# Patient Record
Sex: Female | Born: 1975 | Race: Black or African American | Hispanic: No | Marital: Single
Health system: Southern US, Academic
[De-identification: ages and names within clinical notes are randomized; demographics above are authoritative.]

## PROBLEM LIST (undated history)

## (undated) ENCOUNTER — Encounter

## (undated) ENCOUNTER — Ambulatory Visit

## (undated) ENCOUNTER — Ambulatory Visit: Payer: PRIVATE HEALTH INSURANCE

## (undated) ENCOUNTER — Encounter
Attending: Student in an Organized Health Care Education/Training Program | Primary: Student in an Organized Health Care Education/Training Program

## (undated) ENCOUNTER — Ambulatory Visit: Payer: Medicare (Managed Care)

## (undated) ENCOUNTER — Ambulatory Visit: Payer: PRIVATE HEALTH INSURANCE | Attending: Adult Health | Primary: Adult Health

## (undated) ENCOUNTER — Encounter: Attending: Adult Health | Primary: Adult Health

## (undated) ENCOUNTER — Ambulatory Visit: Payer: MEDICARE

## (undated) ENCOUNTER — Telehealth

## (undated) ENCOUNTER — Encounter: Attending: Pharmacist | Primary: Pharmacist

## (undated) ENCOUNTER — Encounter: Attending: Family | Primary: Family

## (undated) ENCOUNTER — Telehealth
Attending: Student in an Organized Health Care Education/Training Program | Primary: Student in an Organized Health Care Education/Training Program

## (undated) ENCOUNTER — Telehealth: Attending: Radiation Oncology | Primary: Radiation Oncology

## (undated) ENCOUNTER — Inpatient Hospital Stay: Payer: Medicare (Managed Care)

## (undated) ENCOUNTER — Ambulatory Visit: Attending: Radiation Oncology | Primary: Radiation Oncology

## (undated) ENCOUNTER — Ambulatory Visit: Payer: PRIVATE HEALTH INSURANCE | Attending: Physician Assistant | Primary: Physician Assistant

## (undated) ENCOUNTER — Telehealth: Attending: MS" | Primary: MS"

## (undated) ENCOUNTER — Inpatient Hospital Stay: Payer: MEDICAID

## (undated) ENCOUNTER — Encounter: Attending: Oncology | Primary: Oncology

## (undated) ENCOUNTER — Ambulatory Visit
Payer: PRIVATE HEALTH INSURANCE | Attending: Rehabilitative and Restorative Service Providers" | Primary: Rehabilitative and Restorative Service Providers"

## (undated) ENCOUNTER — Ambulatory Visit: Payer: Medicaid (Managed Care)

## (undated) ENCOUNTER — Ambulatory Visit: Payer: PRIVATE HEALTH INSURANCE | Attending: MS" | Primary: MS"

## (undated) ENCOUNTER — Other Ambulatory Visit

## (undated) ENCOUNTER — Encounter: Attending: Critical Care Medicine | Primary: Critical Care Medicine

## (undated) ENCOUNTER — Encounter: Attending: Radiation Oncology | Primary: Radiation Oncology

## (undated) ENCOUNTER — Ambulatory Visit: Payer: MEDICAID

## (undated) ENCOUNTER — Telehealth: Attending: Pharmacist | Primary: Pharmacist

## (undated) ENCOUNTER — Ambulatory Visit: Payer: Medicare (Managed Care) | Attending: Neurology | Primary: Neurology

## (undated) ENCOUNTER — Encounter: Attending: MS" | Primary: MS"

## (undated) ENCOUNTER — Telehealth: Attending: Adult Health | Primary: Adult Health

## (undated) ENCOUNTER — Telehealth: Attending: Family Medicine | Primary: Family Medicine

## (undated) ENCOUNTER — Telehealth: Attending: Critical Care Medicine | Primary: Critical Care Medicine

## (undated) ENCOUNTER — Ambulatory Visit: Payer: Medicare (Managed Care) | Attending: Adult Health | Primary: Adult Health

## (undated) ENCOUNTER — Ambulatory Visit: Payer: PRIVATE HEALTH INSURANCE | Attending: Radiation Oncology | Primary: Radiation Oncology

## (undated) ENCOUNTER — Telehealth: Attending: Children | Primary: Children

## (undated) ENCOUNTER — Encounter: Attending: Hematology & Oncology | Primary: Hematology & Oncology

## (undated) ENCOUNTER — Telehealth: Attending: "Endocrinology | Primary: "Endocrinology

## (undated) ENCOUNTER — Telehealth: Attending: Family | Primary: Family

## (undated) ENCOUNTER — Inpatient Hospital Stay

## (undated) ENCOUNTER — Telehealth: Attending: Pulmonary Disease | Primary: Pulmonary Disease

## (undated) DIAGNOSIS — D219 Benign neoplasm of connective and other soft tissue, unspecified: Secondary | ICD-10-CM

## (undated) DIAGNOSIS — C50919 Malignant neoplasm of unspecified site of unspecified female breast: Secondary | ICD-10-CM

## (undated) DIAGNOSIS — I10 Essential (primary) hypertension: Secondary | ICD-10-CM

## (undated) HISTORY — DX: Essential (primary) hypertension: I10

## (undated) HISTORY — DX: Malignant neoplasm of unspecified site of unspecified female breast: C50.919

## (undated) HISTORY — DX: Benign neoplasm of connective and other soft tissue, unspecified: D21.9

## (undated) HISTORY — PX: ABDOMINAL HYSTERECTOMY: SHX81

---

## 2005-03-20 ENCOUNTER — Emergency Department: Payer: Self-pay | Admitting: Emergency Medicine

## 2005-03-21 ENCOUNTER — Ambulatory Visit: Payer: Self-pay | Admitting: Emergency Medicine

## 2005-08-14 ENCOUNTER — Observation Stay: Payer: Self-pay | Admitting: Obstetrics and Gynecology

## 2005-09-01 ENCOUNTER — Observation Stay: Payer: Self-pay | Admitting: Obstetrics and Gynecology

## 2005-09-08 ENCOUNTER — Observation Stay: Payer: Self-pay | Admitting: Obstetrics and Gynecology

## 2005-09-12 ENCOUNTER — Inpatient Hospital Stay: Payer: Self-pay | Admitting: Obstetrics and Gynecology

## 2007-04-14 ENCOUNTER — Emergency Department: Payer: Self-pay | Admitting: Emergency Medicine

## 2007-04-22 ENCOUNTER — Ambulatory Visit: Payer: Self-pay | Admitting: Obstetrics and Gynecology

## 2007-04-23 ENCOUNTER — Inpatient Hospital Stay: Payer: Self-pay | Admitting: Obstetrics and Gynecology

## 2007-04-27 ENCOUNTER — Emergency Department: Payer: Self-pay | Admitting: General Practice

## 2008-04-30 ENCOUNTER — Other Ambulatory Visit: Payer: Self-pay

## 2008-04-30 ENCOUNTER — Emergency Department: Payer: Self-pay | Admitting: Emergency Medicine

## 2008-08-04 ENCOUNTER — Emergency Department: Payer: Self-pay | Admitting: Emergency Medicine

## 2009-07-22 IMAGING — CT CT ABD-PELV W/O CM
1 of 2 series · 15 of 32 positions shown, 19 images · non-contrast
Comparison: none

REASON FOR EXAM: (1) R FLANK, RUQ, R LATERAL, RLQ PAIN; (2) PARTIAL HYST,
UNSURE WHICH OVARY LEFT
COMMENTS:

[Series 2: stone · axial · 0.63mm/px · z∈[-848,-482]mm · 15 of 137 slices shown, 19 images]
[im 10/137  soft-tissue]
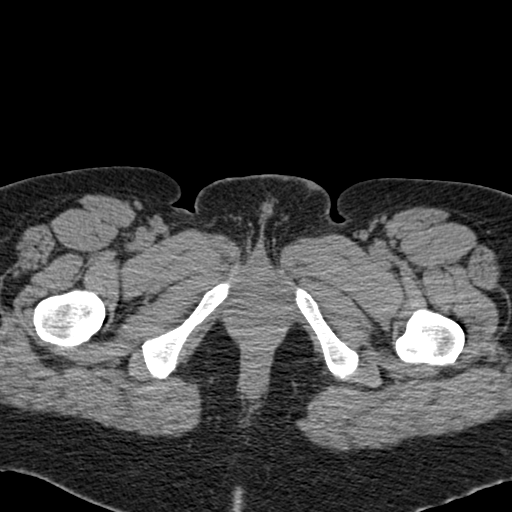
[im 10/137  bone]
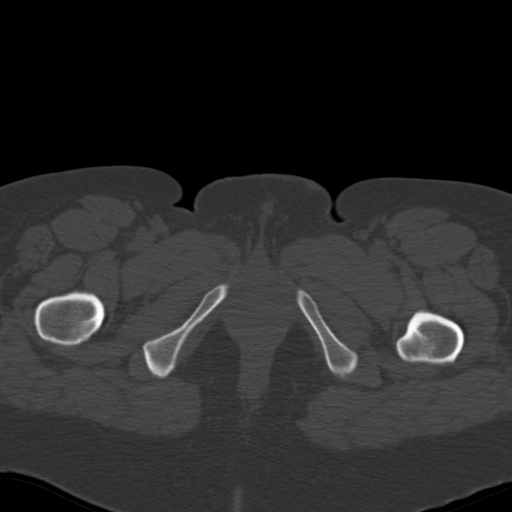
[im 20/137  soft-tissue]
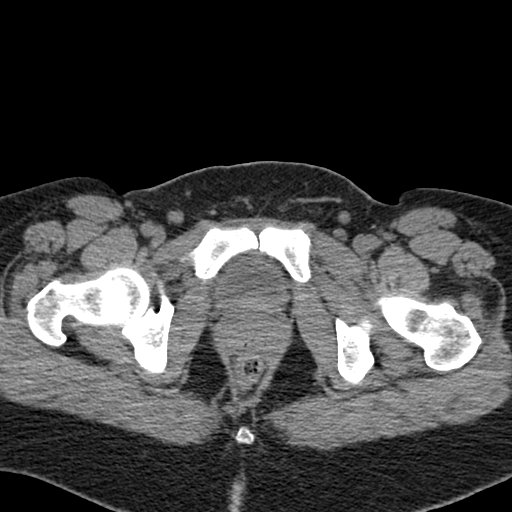
[im 30/137  soft-tissue]
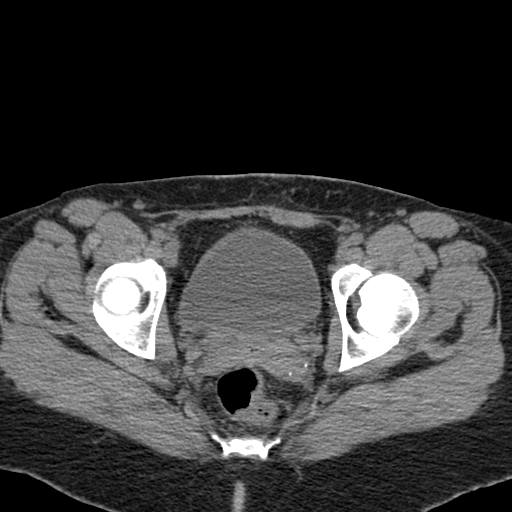
[im 39/137  soft-tissue]
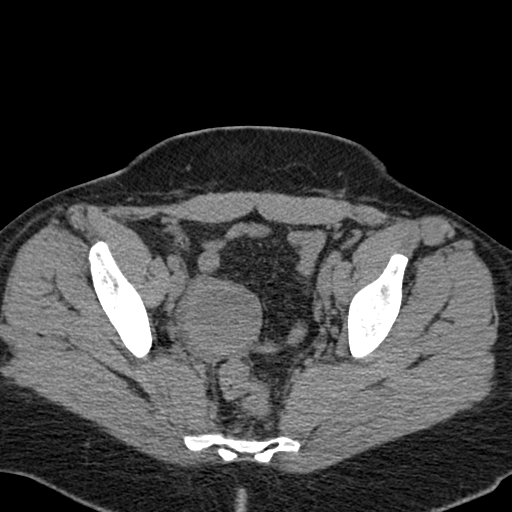
[im 49/137  soft-tissue]
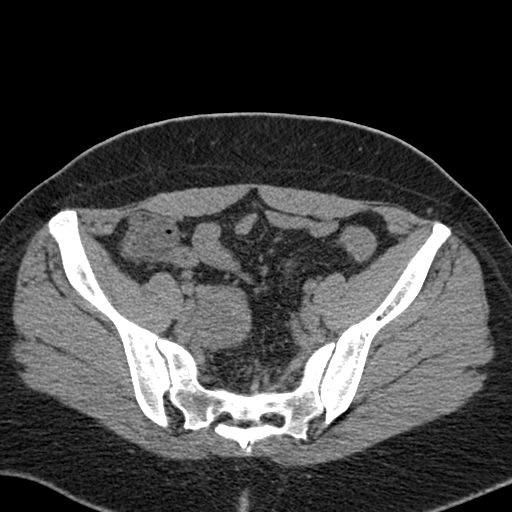
[im 59/137  soft-tissue]
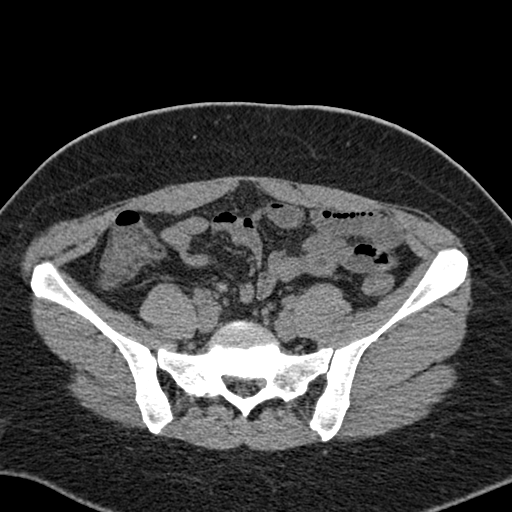
[im 69/137  soft-tissue]
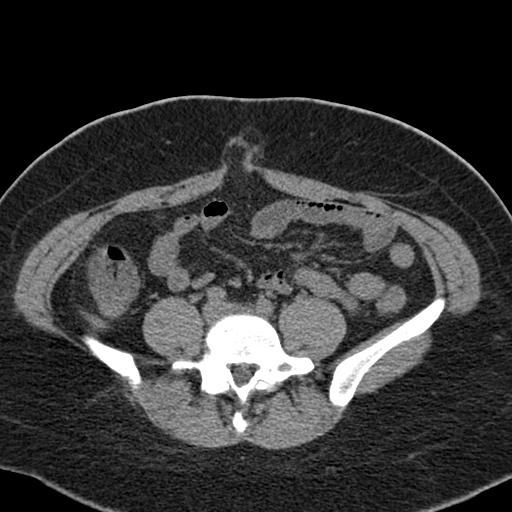
[im 78/137  soft-tissue]
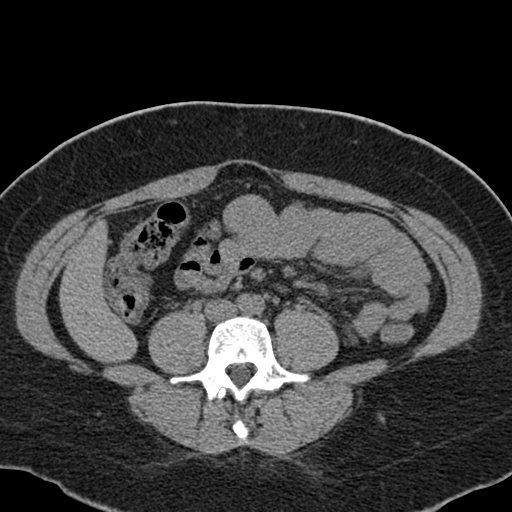
[im 88/137  soft-tissue]
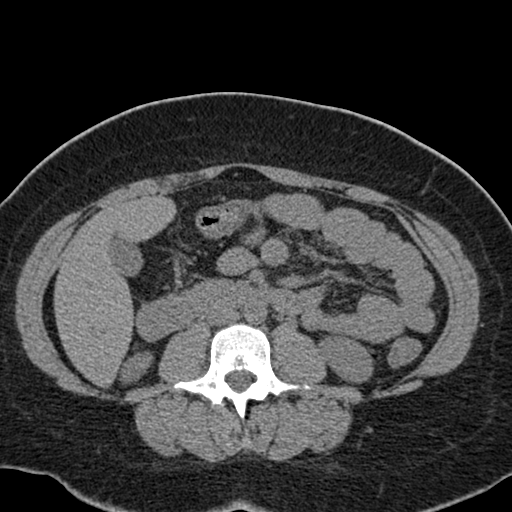
[im 88/137  bone]
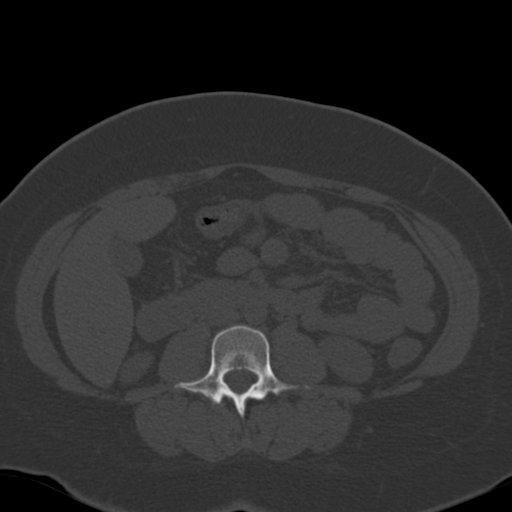
[im 98/137  soft-tissue]
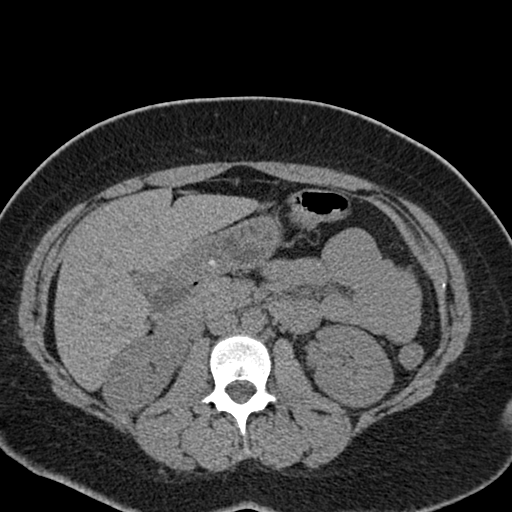
[im 107/137  soft-tissue]
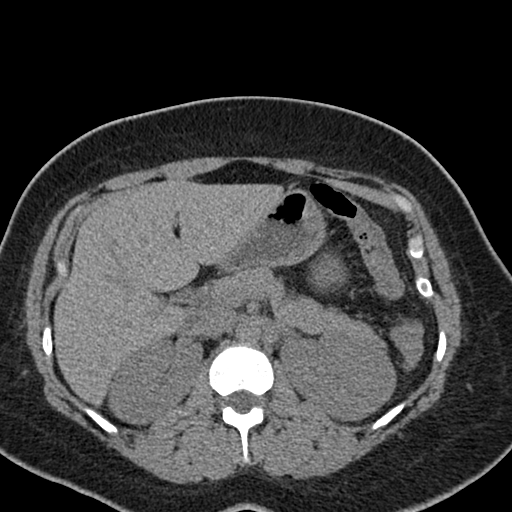
[im 117/137  soft-tissue]
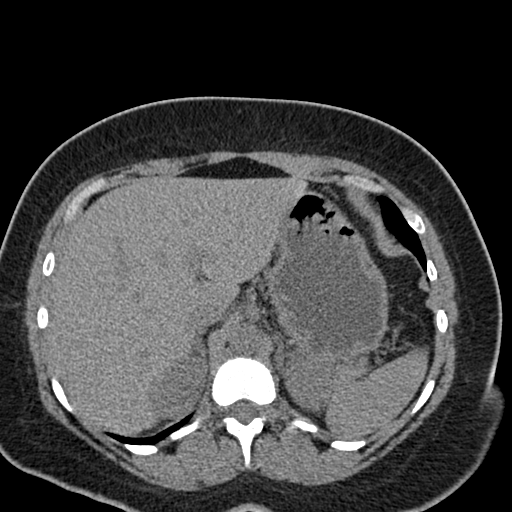
[im 117/137  lung]
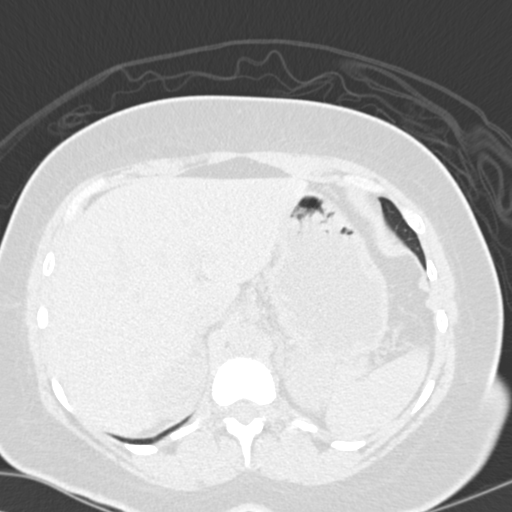
[im 122/137  lung]
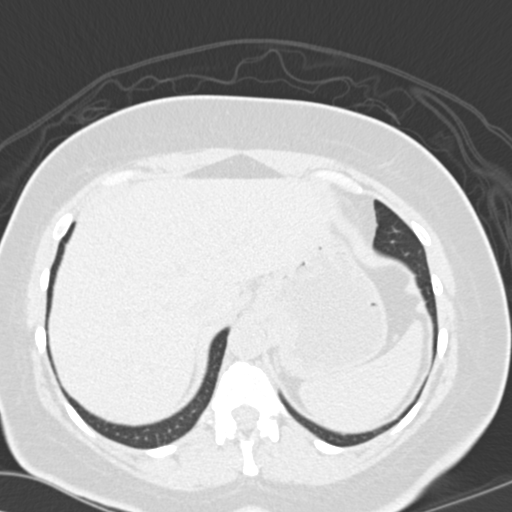
[im 127/137  soft-tissue]
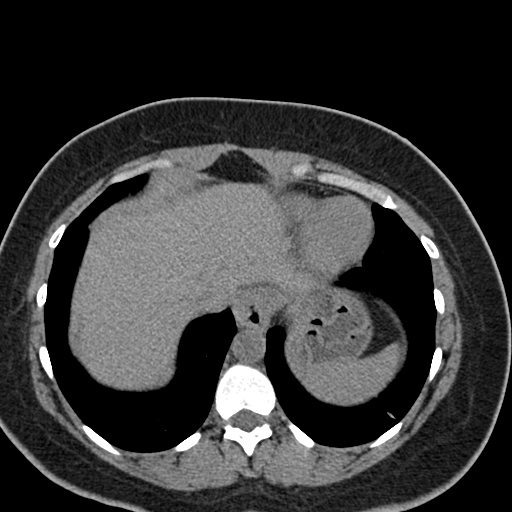
[im 127/137  lung]
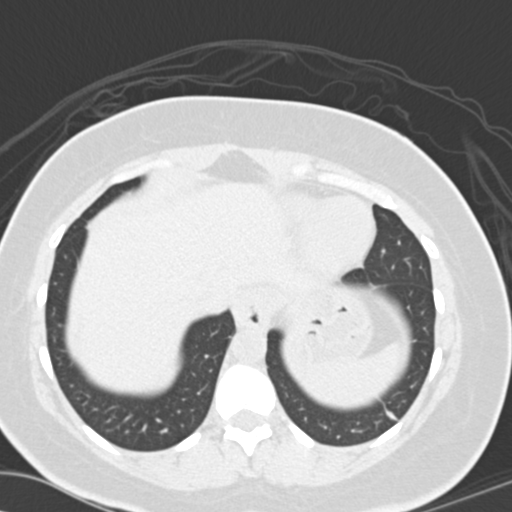
[im 132/137  lung]
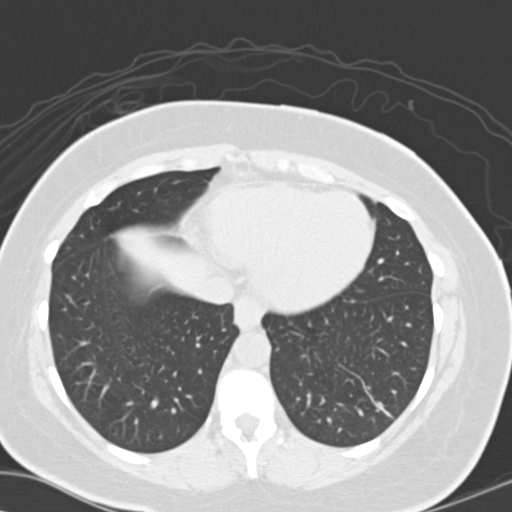

[15 of 32 positions shown; findings below may reference images not displayed]

PROCEDURE:     CT  - CT ABDOMEN AND PELVIS W[DATE]  [DATE]

RESULT:     Helical noncontrasted 3-mm sections were obtained from the lung
bases through the pubic symphysis.

Evaluation of the lung bases demonstrates no gross abnormalities.

Within the limitations of a noncontrasted CT, the liver, spleen, adrenals,
pancreas and kidneys are unremarkable.  There is no CT evidence of an
abdominal aortic aneurysm or CT evidence of bowel obstruction,
diverticulitis, colitis or appendicitis.

Specifically, there is no CT evidence of nephrolithiasis, ureterolithiasis,
hydronephrosis or hydroureter.

Within the pelvis, a 4.9 cm rounded mass is appreciated.  This demonstrates
Hounsfield units of 30. Differential considerations are an ovarian cyst
possibly a complex cyst and/or hemorrhagic.  Etiology such as an
endometrioma cannot be excluded. No further pelvic masses, free fluid or
drainable loculated fluid collections are appreciated.  The urinary bladder
is distended with urine.  Phleboliths are identified within the pelvis.
IMPRESSION: 1.     Low attenuating mass within the RIGHT adnexal region.  Differential
considerations are a complex and/or hemorrhagic cyst or possibly etiology
such as an endometrioma.  A dermoid cannot be completely excluded and
further evaluation with pelvic ultrasound is recommended.  Non ovarian
etiologies cannot be excluded either.
2.     Dr. Danii of the Emergency Department was informed of these findings
via preliminary fax report on 04/30/08 at [DATE] a.m. CST.

## 2009-07-22 IMAGING — US US PELV - US TRANSVAGINAL
1 series · 17 of 25 positions shown · non-contrast
Comparison: none

REASON FOR EXAM: EVAL RIGHT COMPLEX CYST
COMMENTS:

[Series 1: us pelv - us transvaginal · 17 of 39 slices shown]
[im 1/39]
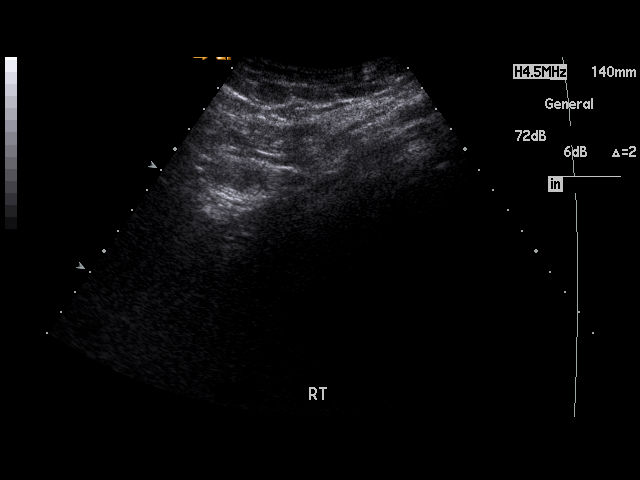
[im 4/39]
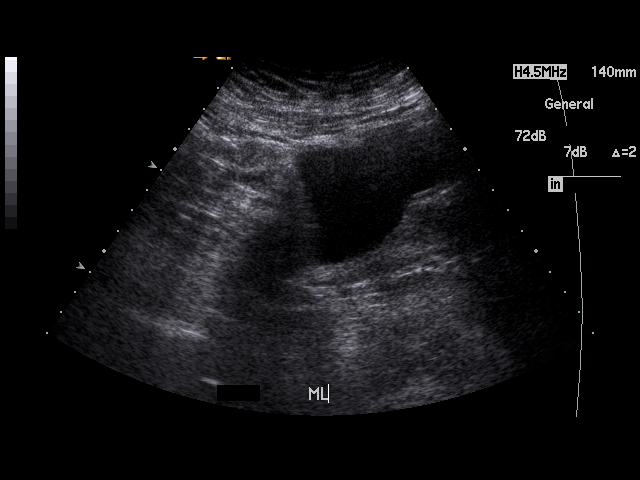
[im 5/39]
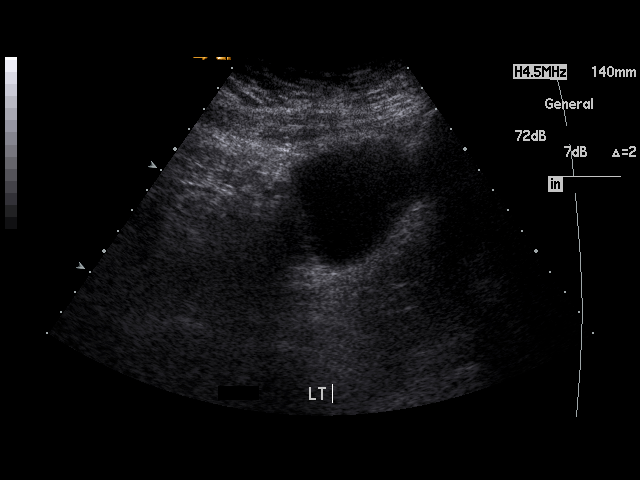
[im 8/39]
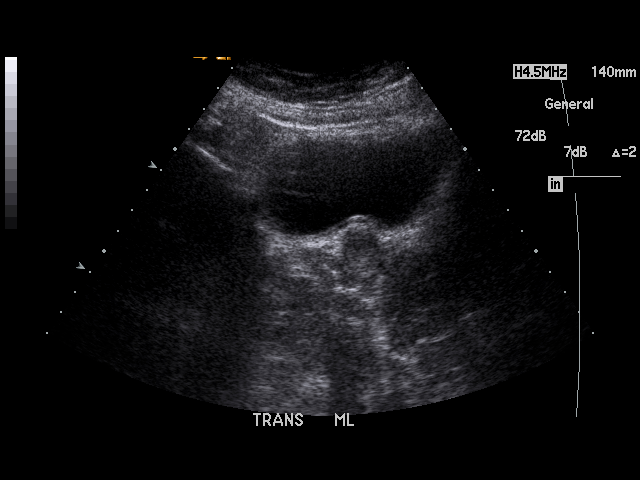
[im 10/39]
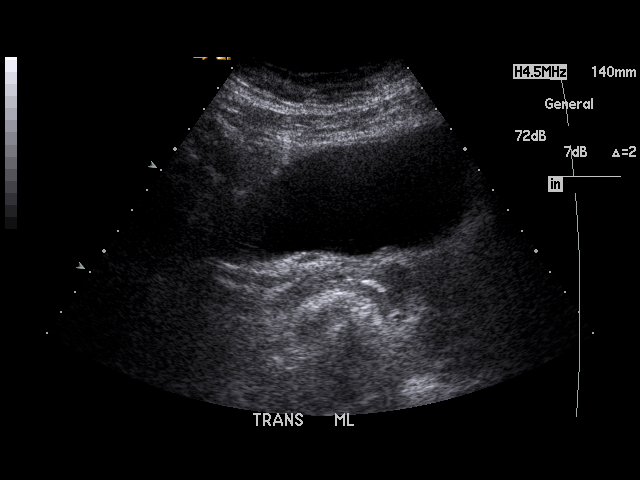
[im 13/39]
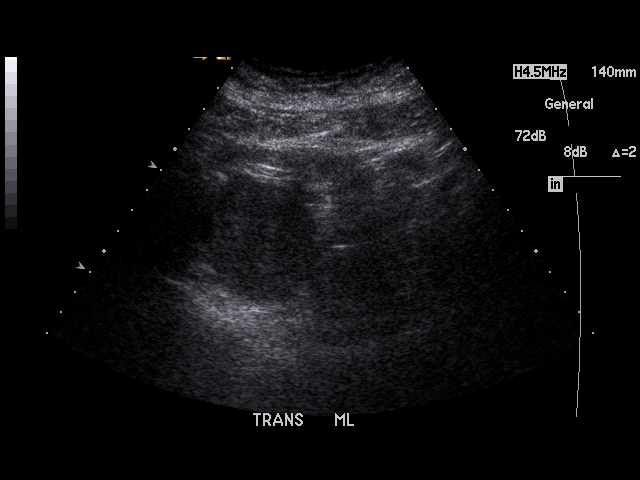
[im 15/39]
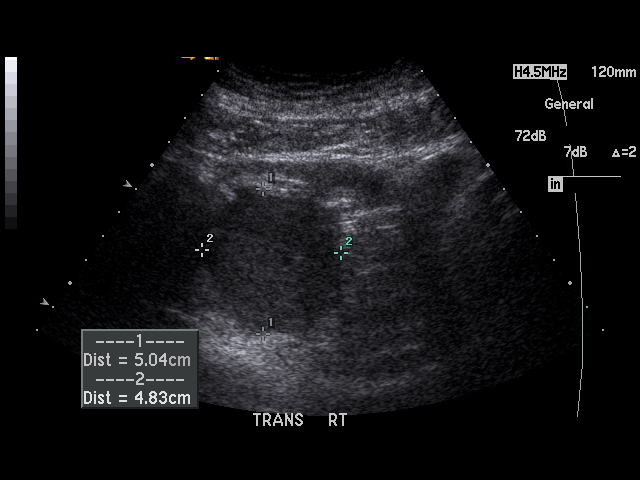
[im 18/39]
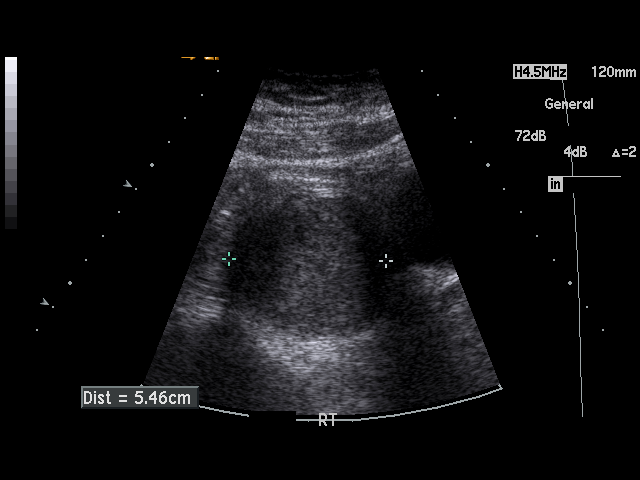
[im 20/39]
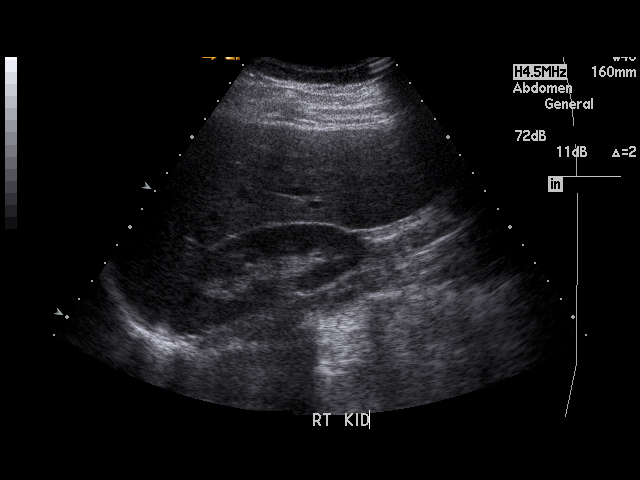
[im 21/39]
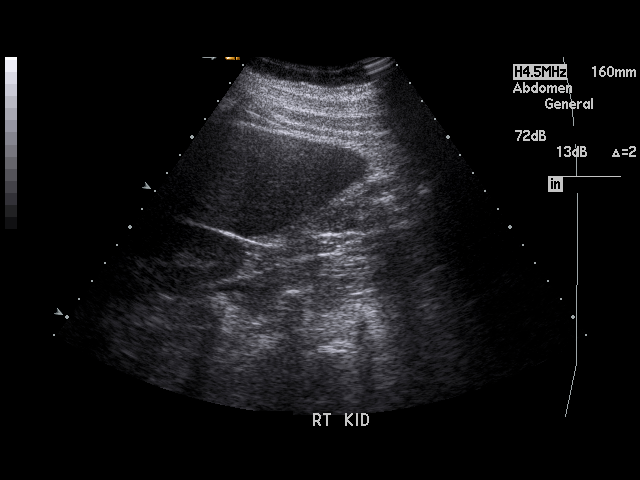
[im 24/39]
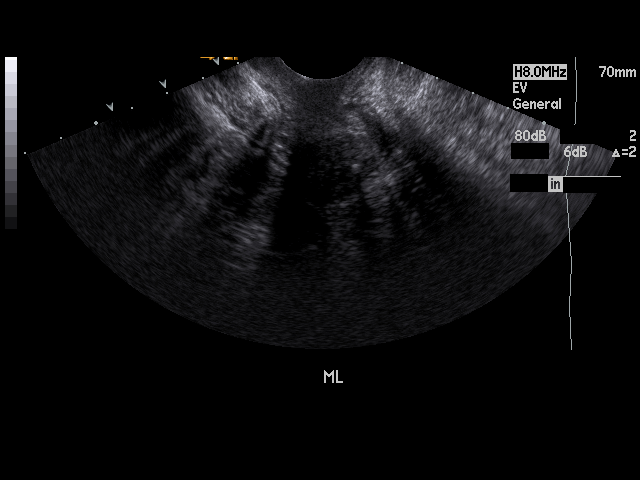
[im 26/39]
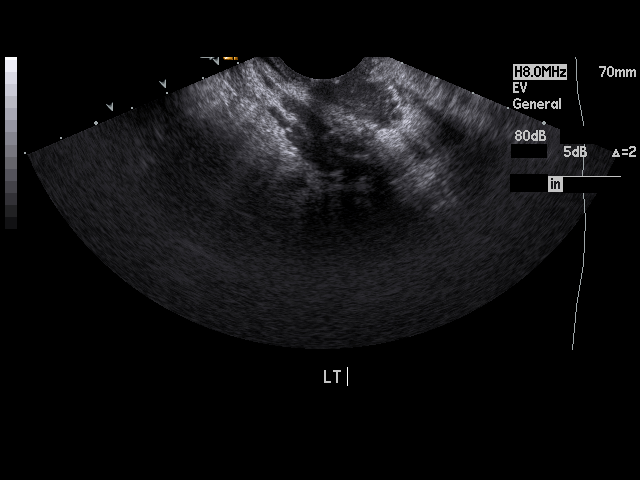
[im 29/39]
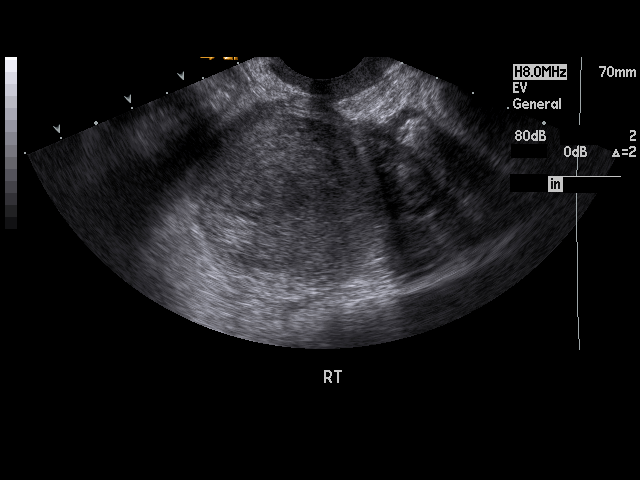
[im 31/39]
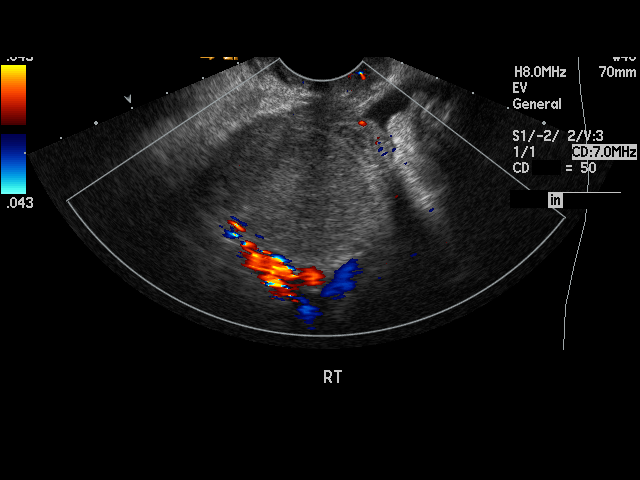
[im 34/39]
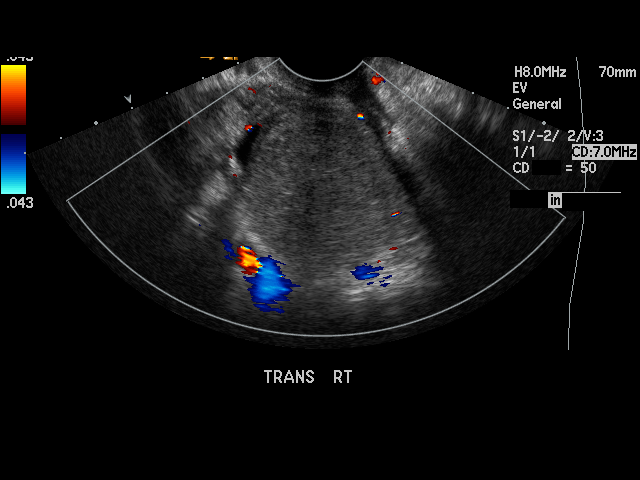
[im 35/39]
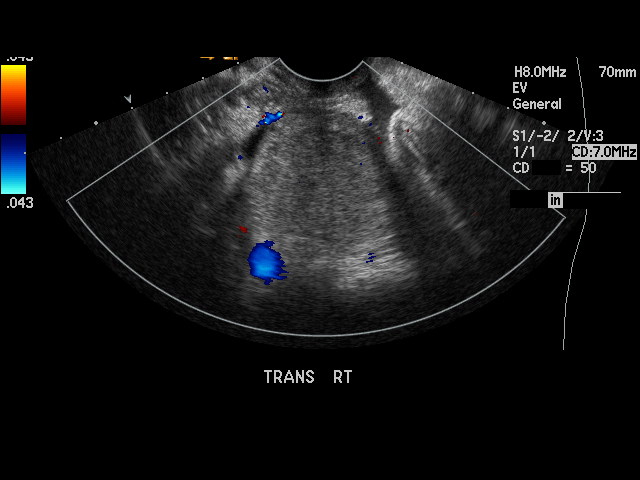
[im 39/39]
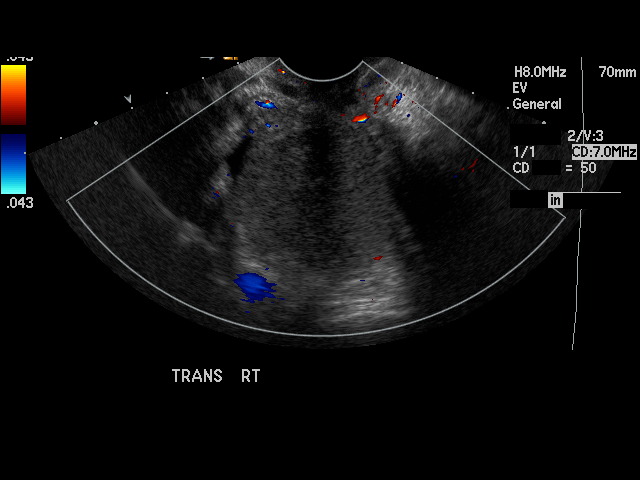

[17 of 25 positions shown; findings below may reference images not displayed]

PROCEDURE:     US  - US PELVIS MASS EXAM W/TRANSVAGI  - April 30, 2008  [DATE]

RESULT:     The patient is status post hysterectomy.  Endovaginal imaging
was performed.

Within the RIGHT adnexal region, the RIGHT ovary is appreciated measuring
5.05 x 5.02 x 5.02 cm.

A complex mass is appreciated involving the RIGHT ovary measuring 4.3 x
x 4.57 cm.  Evaluation of the RIGHT and LEFT kidneys demonstrates no gross
sonographic abnormalities. There does not appear to be evidence of color
flow within the RIGHT adnexal ovarian mass.
IMPRESSION: 1.     Complex mass involving the RIGHT ovary. Differential considerations
are a complex and/or hemorrhagic cyst.  Etiology such as an endometrioma
cannot be excluded nor more ominous etiologies if and as clinically
warranted.   Further evaluation with GYN consultation recommended.  This
area can be monitored with surveillance ultrasound if and as clinically
warranted or further evaluated with direct visualization.
2.     Dr. Ceejay of the Emergency Department was informed of these findings
via preliminary fax report on 04/30/08 at [DATE] a.m. CST.

## 2011-08-26 ENCOUNTER — Emergency Department: Payer: Self-pay | Admitting: Emergency Medicine

## 2017-11-04 ENCOUNTER — Emergency Department
Admission: EM | Admit: 2017-11-04 | Discharge: 2017-11-04 | Disposition: A | Discharge status: Home / Self Care | Payer: Medicaid Other | Attending: Emergency Medicine | Admitting: Emergency Medicine

## 2017-11-04 ENCOUNTER — Emergency Department: Payer: Medicaid Other

## 2017-11-04 ENCOUNTER — Encounter: Payer: Self-pay | Admitting: Emergency Medicine

## 2017-11-04 DIAGNOSIS — F17210 Nicotine dependence, cigarettes, uncomplicated: Secondary | ICD-10-CM | POA: Insufficient documentation

## 2017-11-04 DIAGNOSIS — R0789 Other chest pain: Secondary | ICD-10-CM | POA: Diagnosis not present

## 2017-11-04 DIAGNOSIS — J410 Simple chronic bronchitis: Secondary | ICD-10-CM | POA: Insufficient documentation

## 2017-11-04 DIAGNOSIS — R9431 Abnormal electrocardiogram [ECG] [EKG]: Secondary | ICD-10-CM | POA: Insufficient documentation

## 2017-11-04 DIAGNOSIS — R0981 Nasal congestion: Secondary | ICD-10-CM | POA: Diagnosis present

## 2017-11-04 LAB — BASIC METABOLIC PANEL
ANION GAP: 9 (ref 5–15)
BUN: 10 mg/dL (ref 6–20)
CALCIUM: 8.7 mg/dL — AB (ref 8.9–10.3)
CO2: 24 mmol/L (ref 22–32)
CREATININE: 0.69 mg/dL (ref 0.44–1.00)
Chloride: 103 mmol/L (ref 101–111)
GFR calc non Af Amer: >60 mL/min (ref >60)
Glucose, Bld: 93 mg/dL (ref 65–99)
Potassium: 3.9 mmol/L (ref 3.5–5.1)
SODIUM: 136 mmol/L (ref 135–145)

## 2017-11-04 LAB — CBC
HCT: 41.2 % (ref 35.0–47.0)
HEMOGLOBIN: 13.4 g/dL (ref 12.0–16.0)
MCH: 24.7 pg — AB (ref 26.0–34.0)
MCHC: 32.4 g/dL (ref 32.0–36.0)
MCV: 76.2 fL — ABNORMAL LOW (ref 80.0–100.0)
PLATELETS: 192 10*3/uL (ref 150–440)
RBC: 5.4 MIL/uL — AB (ref 3.80–5.20)
RDW: 13.8 % (ref 11.5–14.5)
WBC: 6 10*3/uL (ref 3.6–11.0)

## 2017-11-04 LAB — TROPONIN I

## 2017-11-04 MED ORDER — BENZONATATE 100 MG PO CAPS: 100.0000 mg | ORAL_CAPSULE | Freq: Three times a day (TID) | ORAL | 0 refills | Status: DC | PRN

## 2017-11-04 MED ORDER — ASPIRIN 81 MG PO CHEW: 324.0000 mg | CHEWABLE_TABLET | Freq: Once | ORAL | Status: DC

## 2017-11-04 MED ORDER — ALBUTEROL SULFATE HFA 108 (90 BASE) MCG/ACT IN AERS: INHALATION_SPRAY | RESPIRATORY_TRACT | 1 refills | Status: DC

## 2017-11-04 NOTE — ED Triage Notes (Signed)
Pt sent to ED by PCP for abnormal EKG. Pt has c/o of chest pain that she relates to congestion that has been ongoing for 3 weeks. Pt has productive cough that produces clear mucus.

## 2017-11-04 NOTE — ED Notes (Signed)
Pt states she went to the [facility] today for cough with chest pain that she has had intermittent, states she has had a cough for the past 3 weeks.Marland Kitchen

## 2017-11-04 NOTE — Discharge Instructions (Addendum)
You have been seen in the Emergency Department (ED) today for an abnormal EKG and occasional chest pains.  As we have discussed today?s test results are reassuring, but you may require further testing.  Please follow up with the recommended doctor as instructed above in these documents regarding today?s emergent visit and your recent symptoms to discuss further management.  Continue to take your regular medications. If you are not doing so already, please also take a daily baby aspirin (81 mg), at least until you follow up with your doctor or a cardiologist.  Return to the Emergency Department (ED) if you experience any further chest pain/pressure/tightness, difficulty breathing, or sudden sweating, or other symptoms that concern you.

## 2017-11-04 NOTE — ED Provider Notes (Signed)
[facility] Emergency Department Provider Note  ____________________________________________   First MD Initiated Contact with Patient 11/04/17 1427     (approximate)  I have reviewed the triage vital signs and the nursing notes.   HISTORY  Chief Complaint No chief complaint on file.    HPI Tara Hanna is a 41 y.o. female who smokes but has no other chronic medical history and presents at the recommendation of the urgent care provider she saw earlier today due to abnormal EKG findings.  The patient has had respiratory symptoms for about 3 weeks, which started as a cold with the usual nasal congestion, runny nose, and cough, but has developed into just a persistent cough.  She reports that she has occasionally felt some chest pain or discomfort that is mild and not related to any specific activity except possibly coughing.  She went to the urgent care today to see if she can get some medicine to help with her cough but when they did an EKG they found some abnormal findings and referred her to the emergency department.  She reports that she has not had any chest pain recently and it is not predictable.  She denies shortness of breath, fever/chills, nausea, vomiting, and abdominal pain.  Her cough is more annoying than it is a problem more painful.  She describes her symptoms as mild, possibly moderate with a cough.  She does not have hypertension, diabetes, nor a personal history of heart disease.  She does smoke every day.  There is no first-degree relative family history of heart attack.   History reviewed. No pertinent past medical history.  There are no active problems to display for this patient.   Past Surgical History:  Procedure Laterality Date  . ABDOMINAL HYSTERECTOMY      Prior to Admission medications   Medication Sig Start Date End Date Taking? Authorizing Provider  albuterol (PROVENTIL HFA;VENTOLIN HFA) 108 (90 Base) MCG/ACT inhaler Inhale  2-4 puffs by mouth every 4 hours as needed for wheezing, cough, and/or shortness of breath 11/04/17   Hinda Kehr, MD  benzonatate (TESSALON PERLES) 100 MG capsule Take 1 capsule (100 mg total) by mouth 3 (three) times daily as needed for cough. 11/04/17   Hinda Kehr, MD    Allergies Patient has no known allergies.  History reviewed. No pertinent family history.  Social History Social History   Tobacco Use  . Smoking status: Current Every Day Smoker    Packs/day: 1.00    Types: Cigarettes  . Smokeless tobacco: Never Used  Substance Use Topics  . Alcohol use: Yes  . Drug use: Yes    Types: Marijuana    Review of Systems Constitutional: No fever/chills Eyes: No visual changes. ENT: No sore throat. Cardiovascular: occasional mild chest pain over the last several weeks Respiratory: Denies shortness of breath.  Persistent cough x 3 weeks Gastrointestinal: No abdominal pain.  No nausea, no vomiting.  No diarrhea.  No constipation. Genitourinary: Negative for dysuria. Musculoskeletal: Negative for neck pain.  Negative for back pain. Integumentary: Negative for rash. Neurological: Negative for headaches, focal weakness or numbness.   ____________________________________________   PHYSICAL EXAM:  VITAL SIGNS: ED Triage Vitals  Enc Vitals Group     BP 11/04/17 1213 (!) 150/102     Pulse Rate 11/04/17 1213 79     Resp 11/04/17 1213 20     Temp 11/04/17 1213 98 F (36.7 C)     Temp src --  SpO2 11/04/17 1213 100 %     Weight 11/04/17 1218 90.7 kg (200 lb)     Height 11/04/17 1218 1.626 m (5\' 4" )     Head Circumference --      Peak Flow --      Pain Score 11/04/17 1224 6     Pain Loc --      Pain Edu? --      Excl. in [place]? --     Constitutional: Alert and oriented. Well appearing and in no acute distress. Eyes: Conjunctivae are normal.  Head: Atraumatic. Nose: No congestion/rhinnorhea. Mouth/Throat: Mucous membranes are moist. Neck: No stridor.  No  meningeal signs.   Cardiovascular: Normal rate, regular rhythm. Good peripheral circulation. Grossly normal heart sounds. Respiratory: Normal respiratory effort.  No retractions. Lungs CTAB.  Occasional cough. Gastrointestinal: Soft and nontender. No distention.  Musculoskeletal: No lower extremity tenderness nor edema. No gross deformities of extremities. Neurologic:  Normal speech and language. No gross focal neurologic deficits are appreciated.  Skin:  Skin is warm, dry and intact. No rash noted. Psychiatric: Mood and affect are normal. Speech and behavior are normal.  ____________________________________________   LABS (all labs ordered are listed, but only abnormal results are displayed)  Labs Reviewed  BASIC METABOLIC PANEL - Abnormal; Notable for the following components:      Result Value   Calcium 8.7 (*)    All other components within normal limits  CBC - Abnormal; Notable for the following components:   RBC 5.40 (*)    MCV 76.2 (*)    MCH 24.7 (*)    All other components within normal limits  TROPONIN I   ____________________________________________  EKG  ED ECG REPORT I, Hinda Kehr, the attending physician, personally viewed and interpreted this ECG.  Date: 11/04/2017 EKG Time: 12:17 PM Rate: 74 Rhythm: normal sinus rhythm QRS Axis: normal Intervals: normal ST/T Wave abnormalities: normal Narrative Interpretation: no evidence of acute ischemia  ____________________________________________  RADIOLOGY   Dg Chest 2 View  Result Date: 11/04/2017 CLINICAL DATA:  Chest pain for several weeks EXAM: CHEST  2 VIEW COMPARISON:  None. FINDINGS: The heart size and mediastinal contours are within normal limits. Both lungs are clear. The visualized skeletal structures are unremarkable. IMPRESSION: No active cardiopulmonary disease. Electronically Signed   By: Inez Catalina M.D.   On: 11/04/2017 13:15     ____________________________________________   PROCEDURES  Critical Care performed: No   Procedure(s) performed:   Procedures   ____________________________________________   INITIAL IMPRESSION / ASSESSMENT AND PLAN / ED COURSE  As part of my medical decision making, I reviewed the following data within the [facility] notes reviewed and incorporated, Labs reviewed , EKG interpreted , Old EKG reviewed and Old chart reviewed    Differential diagnosis includes, but is not limited to, ACS, aortic dissection, pulmonary embolism, cardiac tamponade, pneumothorax, pneumonia, pericarditis, myocarditis, GI-related causes including esophagitis/gastritis, and musculoskeletal chest wall pain.    I reviewed the documentation sent over from the urgent care.  Unfortunately I could not read the majority of the handwriting on the notes, but I did see the EKG and there were inverted T waves in leads II, III, and aVF.  However, the patient's EKG was completely normal here in the emergency department, and she denies having chest pain at the time of the EKG at the urgent care.  I do not think this represents dynamic EKG changes in the setting of ACS.  Patient is PERC negative  and has a HEART score of 2 (low risk), even taking into consideration the EKG changes seen at urgent care.  Based upon her low risk and chronicity of symptoms, I do not think that she would benefit from a second troponin.  I believe it is more likely that the patient is having occasional chest pain related to her chronic cough and/or GI causes, because she states that when she is having the pain she feels gassy and the symptoms were relieved when she took some antacids.  Regardless I do not feel she needs admission but I did counsel her about the benefits of an outpatient follow-up appointment with cardiology, possibly for a stress test.  I gave her a full dose aspirin in the emergency department and  encouraged her to take a daily baby aspirin.  I prescribed Tessalon and an albuterol inhaler for cough.  I gave my usual and customary return precautions.  She understands and agrees with the plan    ____________________________________________  FINAL CLINICAL IMPRESSION(S) / ED DIAGNOSES  Final diagnoses:  Simple chronic bronchitis (HCC)  Abnormal EKG  Atypical chest pain     MEDICATIONS GIVEN DURING THIS VISIT:  Medications  aspirin chewable tablet 324 mg      ED Discharge Orders        Ordered    albuterol (PROVENTIL HFA;VENTOLIN HFA) 108 (90 Base) MCG/ACT inhaler     11/04/17 1447    benzonatate (TESSALON PERLES) 100 MG capsule  3 times daily PRN     11/04/17 1447       Note:  This document was prepared using Dragon voice recognition software and may include unintentional dictation errors.    Hinda Kehr, MD 11/04/17 [phone]

## 2017-12-28 NOTE — Progress Notes (Signed)
New Outpatient Visit Date: 12/30/2017  Referring Provider: [facility] Emergency Department  Chief Complaint: Abnormal EKG and chest pain  HPI:  Ms. Sepulveda is a 42 y.o. female who is being seen today for the evaluation of abnormal EKG at the request of No ref. provider found. She has a history of tobacco use but otherwise no significant past medical history. She presented to [facility] care in mid December with a 3-week history of URI symptoms followed by a persistent cough. She noted occasional chest pain related to coughing. EKG at urgent care was reportedly abnormal, prompting referral to the ED, where repeat EKG was found to be normal.  Today, Ms. Tyndall reports that she continues to have intermittent chest pain.  It seems to be brought on by talking for extended periods or being under stress.  She has had quite a few stressors at home, including recent incarceration of one of her sons.  She describes the pain as both a "lightning pain" on the inside under the left breast and at other times a "big ball of gas" or knot in the center of her chest.  The gas-like pain resolves with belching and has also been helped by an OTC antacid.  Typically, the discomfort lasts anywhere from 5-20-second.  It is not exertional.  Ms. Hoggard sometimes feels a little diaphoretic with the pain, though she notes that she sweats easily even in other situations.  Currently the discomfort happens once or twice a week with a maximal intensity of 5/10.  Ms. Chimenti denies history of cardiac disease and prior testing.  She reports no shortness of breath, orthopnea, and PND.  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  She has also noted rare skipped beats over the last several years.  There are no accompanying symptoms.  -------------------------------------------------------------------------------------------------  Cardiovascular History & Procedures: Cardiovascular  Problems:  Atypical chest pain  Risk Factors:  Tobacco use and obesity  Cath/PCI:  None  CV Surgery:  None  EP Procedures and Devices:  None  Non-Invasive Evaluation(s):  None  Recent CV Pertinent Labs: Lab Results  Component Value Date   K 3.9 11/04/2017   BUN 10 11/04/2017   CREATININE 0.69 11/04/2017   --------------------------------------------------------------------------------------------------  Past Medical History:  Diagnosis Date  . Fibroids     Past Surgical History:  Procedure Laterality Date  . ABDOMINAL HYSTERECTOMY      Current Meds  Medication Sig  . albuterol (PROVENTIL HFA;VENTOLIN HFA) 108 (90 Base) MCG/ACT inhaler Inhale 2-4 puffs by mouth every 4 hours as needed for wheezing, cough, and/or shortness of breath    Allergies: Patient has no known allergies.  Social History   Socioeconomic History  . Marital status: Single    Spouse name: Not on file  . Number of children: Not on file  . Years of education: Not on file  . Highest education level: Not on file  Social Needs  . Financial resource strain: Not on file  . Food insecurity - worry: Not on file  . Food insecurity - inability: Not on file  . Transportation needs - medical: Not on file  . Transportation needs - non-medical: Not on file  Occupational History  . Not on file  Tobacco Use  . Smoking status: Current Every Day Smoker    Packs/day: 1.00    Years: 22.00    Pack years: 22.00    Types: Cigarettes  . Smokeless tobacco: Never Used  Substance and  Sexual Activity  . Alcohol use: Yes    Alcohol/week: 1.2 oz    Types: 2 Shots of liquor per week  . Drug use: Yes    Frequency: 2.0 times per week    Types: Marijuana  . Sexual activity: No  Other Topics Concern  . Not on file  Social History Narrative  . Not on file    Family History  Problem Relation Age of Onset  . Diabetes Mother   . Hypertension Mother   . Heart failure Maternal Grandmother   .  Diabetes Maternal Grandmother     Review of Systems: A 12-system review of systems was performed and was negative except as noted in the HPI.  --------------------------------------------------------------------------------------------------  Physical Exam: BP 132/80 (BP Location: Right Arm, Patient Position: Sitting, Cuff Size: Normal)   Pulse 81   Ht 5\' 5"  (1.651 m)   Wt 200 lb 8 oz (90.9 kg)   BMI 33.36 kg/m   General: Obese woman, seated comfortably in the exam room. HEENT: No conjunctival pallor or scleral icterus. Moist mucous membranes. OP clear. Neck: Supple without lymphadenopathy, thyromegaly, JVD, or HJR. No carotid bruit. Lungs: Normal work of breathing. Clear to auscultation bilaterally without wheezes or crackles. Heart: Regular rate and rhythm without murmurs, rubs, or gallops. Non-displaced PMI. Abd: Bowel sounds present. Soft, NT/ND without hepatosplenomegaly Ext: No lower extremity edema. Radial, PT, and DP pulses are 2+ bilaterally Skin: Warm and dry without rash. Neuro: CNIII-XII intact. Strength and fine-touch sensation intact in upper and lower extremities bilaterally. Psych: Normal mood and affect.  EKG: Normal sinus rhythm without abnormalities.  Lab Results  Component Value Date   WBC 6.0 11/04/2017   HGB 13.4 11/04/2017   HCT 41.2 11/04/2017   MCV 76.2 (L) 11/04/2017   PLT 192 11/04/2017    Lab Results  Component Value Date   NA 136 11/04/2017   K 3.9 11/04/2017   CL 103 11/04/2017   CO2 24 11/04/2017   BUN 10 11/04/2017   CREATININE 0.69 11/04/2017   GLUCOSE 93 11/04/2017    No results found for: CHOL, HDL, LDLCALC, LDLDIRECT, TRIG, CHOLHDL  --------------------------------------------------------------------------------------------------  ASSESSMENT AND PLAN: Atypical chest pain Chest pain is brief and nonexertional.  Description is most consistent with GERD and/or anxiety.  However, Ms. Bartosiewicz is concerned about the potential for  underlying coronary disease, though her only risk factors are tobacco use and obesity.  We have agreed to obtain an exercise tolerance test.  If this is normal, I think it is reasonable to defer additional cardiac workup unless symptoms worsen.  I have recommended famotidine 20 mg twice daily for empiric treatment of GERD.  I will check a fasting lipid panel when the patient returns for her exercise tolerance test for risk stratification.  Abnormal EKG There is report of abnormal EKG when Ms. Dimiceli presented to [facility] in December, 2018.  EKGs from subsequent ED visit as well as today are both normal.  I will request a copy of the reportedly abnormal EKG for further review.  Polysubstance abuse I have recommended that Ms. Dado stop smoking and refrain from using marijuana.  Follow-up: To be determined based on results of stress test.  If normal, Ms. Toomey can follow-up as needed.  Nelva Bush, MD 12/30/2017 9:39 PM

## 2017-12-30 ENCOUNTER — Ambulatory Visit (INDEPENDENT_AMBULATORY_CARE_PROVIDER_SITE_OTHER): Payer: Medicaid Other | Admitting: Internal Medicine

## 2017-12-30 ENCOUNTER — Encounter: Payer: Self-pay | Admitting: Internal Medicine

## 2017-12-30 VITALS — BP 132/80 | HR 81 | Ht 65.0 in | Wt 200.5 lb

## 2017-12-30 DIAGNOSIS — R9431 Abnormal electrocardiogram [ECG] [EKG]: Secondary | ICD-10-CM

## 2017-12-30 DIAGNOSIS — R0789 Other chest pain: Secondary | ICD-10-CM | POA: Diagnosis not present

## 2017-12-30 DIAGNOSIS — F1910 Other psychoactive substance abuse, uncomplicated: Secondary | ICD-10-CM

## 2017-12-30 MED ORDER — FAMOTIDINE 20 MG PO TABS: 20.0000 mg | ORAL_TABLET | Freq: Two times a day (BID) | ORAL | 3 refills | Status: DC

## 2017-12-30 NOTE — Patient Instructions (Signed)
Medication Instructions:  Your physician has recommended you make the following change in your medication:  1- START Famotidine 20 mg (1 tablet) by mouth two times a day.    Labwork: none  Testing/Procedures: Your physician has requested that you have an exercise tolerance test. For further information please visit [email or web address]. Please also follow instruction sheet, as given.   DO NOT drink or eat foods with caffeine for 24 hours before the test. (Chocolate, coffee, tea, decaf coffee/tea, or energy drinks)  DO NOT smoke for 4 hours before your test.  If you use an inhaler, bring it with you to the test.  Wear comfortable shoes and clothing. Women do not wear dresses.   Follow-Up: Your physician recommends that you schedule a follow-up appointment in: as needed depending on test results.     Exercise Stress Electrocardiogram An exercise stress electrocardiogram is a test to check how blood flows to your heart. It is done to find areas of poor blood flow. You will need to walk on a treadmill for this test. The electrocardiogram will record your heartbeat when you are at rest and when you are exercising. What happens before the procedure?  Do not have drinks with caffeine or foods with caffeine for 24 hours before the test, or as told by your doctor. This includes coffee, tea (even decaf tea), sodas, chocolate, and cocoa.  Follow your doctor's instructions about eating and drinking before the test.  Ask your doctor what medicines you should or should not take before the test. Take your medicines with water unless told by your doctor not to.  If you use an inhaler, bring it with you to the test.  Bring a snack to eat after the test.  Do not  smoke for 4 hours before the test.  Do not put lotions, powders, creams, or oils on your chest before the test.  Wear comfortable shoes and clothing. What happens during the procedure?  You will have patches put on your chest.  Small areas of your chest may need to be shaved. Wires will be connected to the patches.  Your heart rate will be watched while you are resting and while you are exercising.  You will walk on the treadmill. The treadmill will slowly get faster to raise your heart rate.  The test will take about 1-2 hours. What happens after the procedure?  Your heart rate and blood pressure will be watched after the test.  You may return to your normal diet, activities, and medicines or as told by your doctor. This information is not intended to replace advice given to you by your health care provider. Make sure you discuss any questions you have with your health care provider. Document Released: 04/28/2008 Document Revised: 07/09/2016 Document Reviewed: 07/18/2013 Elsevier Interactive Patient Education  [employer].

## 2017-12-31 ENCOUNTER — Telehealth: Payer: Self-pay | Admitting: *Deleted

## 2017-12-31 DIAGNOSIS — Z13220 Encounter for screening for lipoid disorders: Secondary | ICD-10-CM

## 2017-12-31 NOTE — Telephone Encounter (Signed)
-----   Message from Nelva Bush, MD sent at 12/30/2017  9:32 PM EST ----- Claris Gladden,  Would it be possible to draw a fasting lipid panel when Ms. Oo comes in for her GXT? Thanks.  Gerald Stabs

## 2017-12-31 NOTE — Telephone Encounter (Signed)
Patient verbalized understanding to be fasting the morning of GXT for lab work. She will come around 0800, have lab work, and bring a light snack to have prior to stress test. Order entered. Scheduler notified.

## 2018-01-04 ENCOUNTER — Other Ambulatory Visit (INDEPENDENT_AMBULATORY_CARE_PROVIDER_SITE_OTHER): Payer: Medicaid Other

## 2018-01-04 ENCOUNTER — Ambulatory Visit (INDEPENDENT_AMBULATORY_CARE_PROVIDER_SITE_OTHER): Payer: Medicaid Other

## 2018-01-04 DIAGNOSIS — Z13220 Encounter for screening for lipoid disorders: Secondary | ICD-10-CM | POA: Diagnosis not present

## 2018-01-04 DIAGNOSIS — R0789 Other chest pain: Secondary | ICD-10-CM

## 2018-01-05 LAB — EXERCISE TOLERANCE TEST
CHL RATE OF PERCEIVED EXERTION: 16
CSEPHR: 87 %
Estimated workload: 7.6 METS
Exercise duration (min): 6 min
Exercise duration (sec): 26 s
MPHR: 178 {beats}/min
Peak HR: 155 {beats}/min
Rest HR: 86 {beats}/min

## 2018-01-05 LAB — LIPID PANEL
CHOL/HDL RATIO: 2.8 ratio (ref 0.0–4.4)
Cholesterol, Total: 168 mg/dL (ref 100–199)
HDL: 60 mg/dL (ref >39)
LDL Calculated: 96 mg/dL (ref 0–99)
TRIGLYCERIDES: 58 mg/dL (ref 0–149)
VLDL Cholesterol Cal: 12 mg/dL (ref 5–40)

## 2019-01-26 IMAGING — CR DG CHEST 2V
2 series · 2 of 2 positions shown · non-contrast
Comparison: None.

CLINICAL DATA: Chest pain for several weeks

EXAM:
CHEST  2 VIEW

[chest pa]
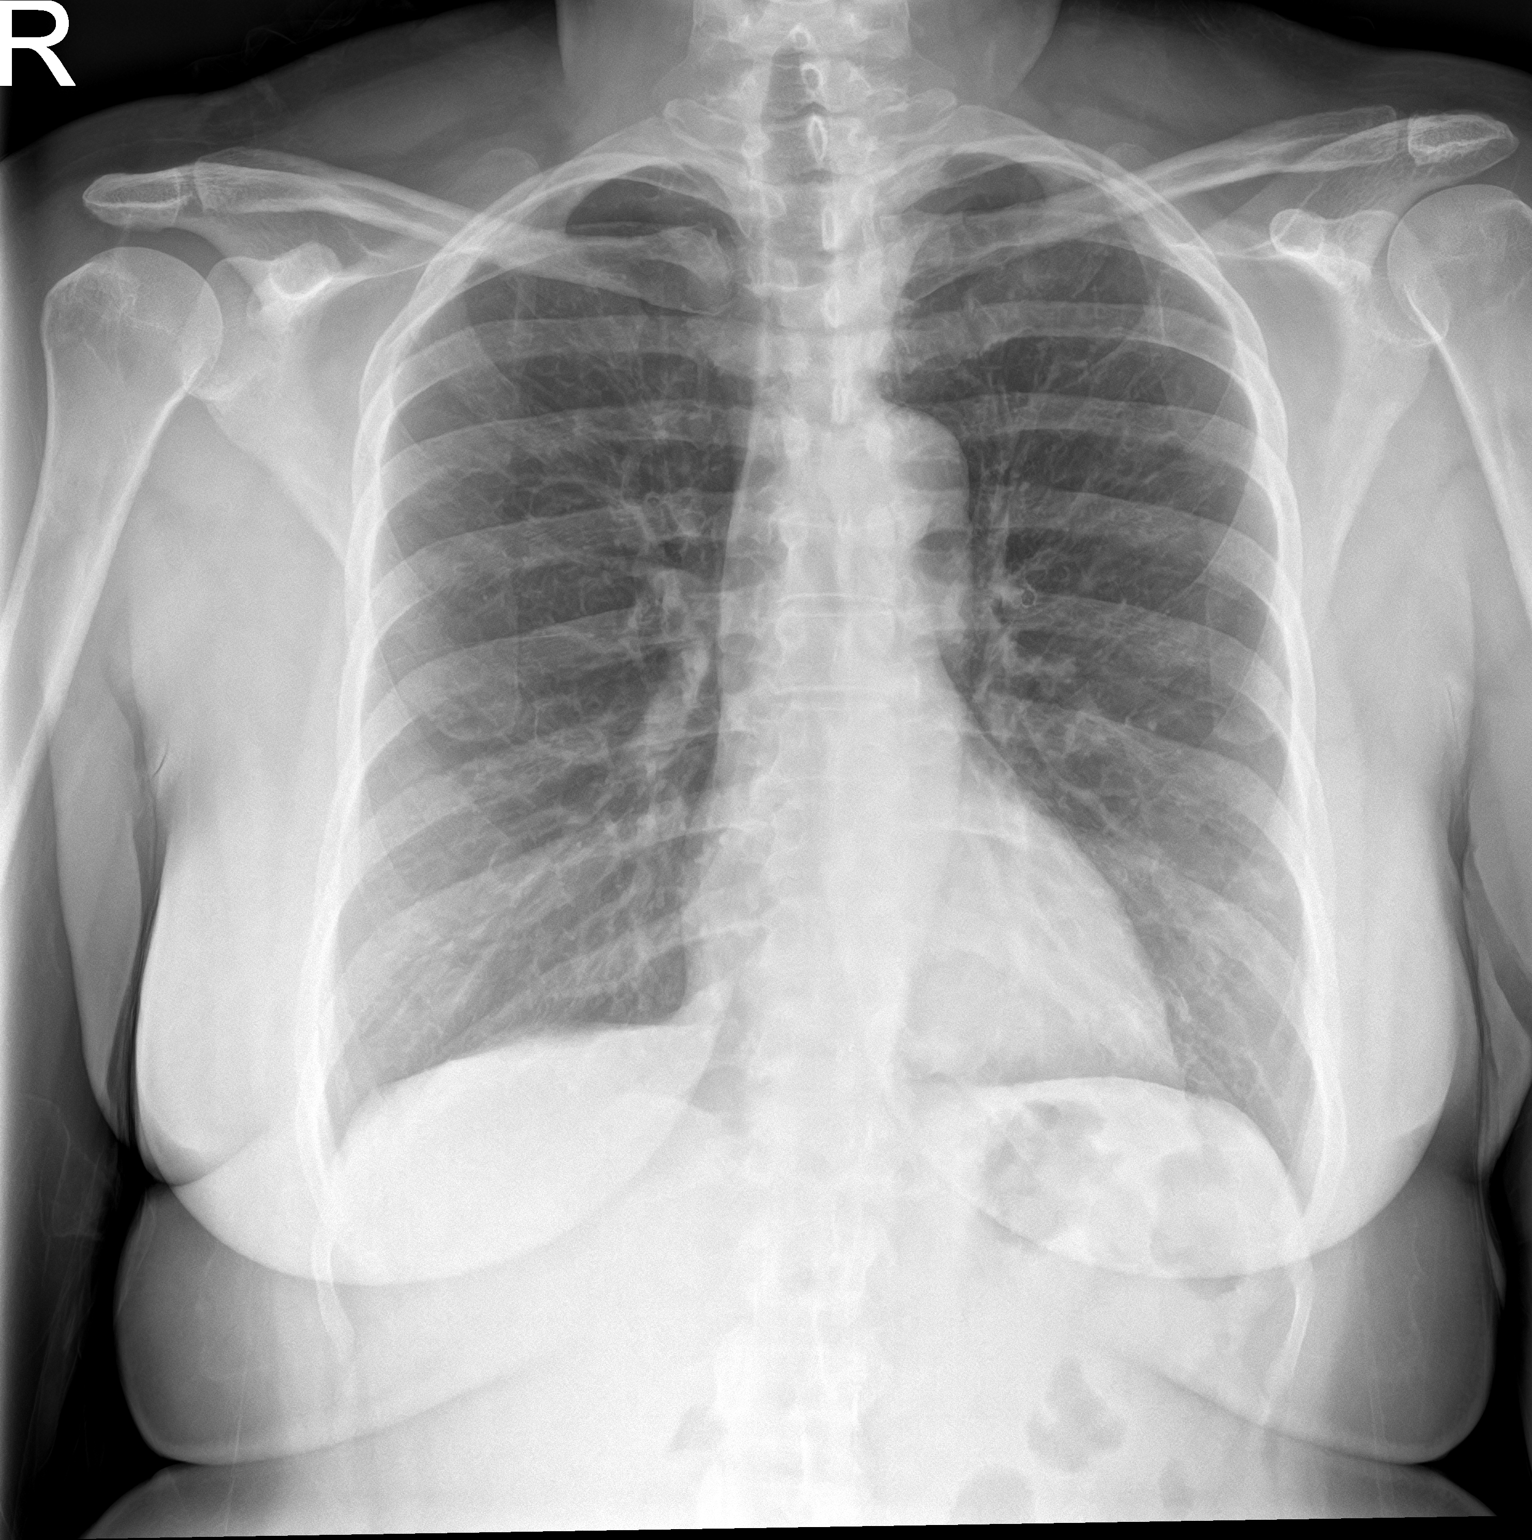

[chest lat]
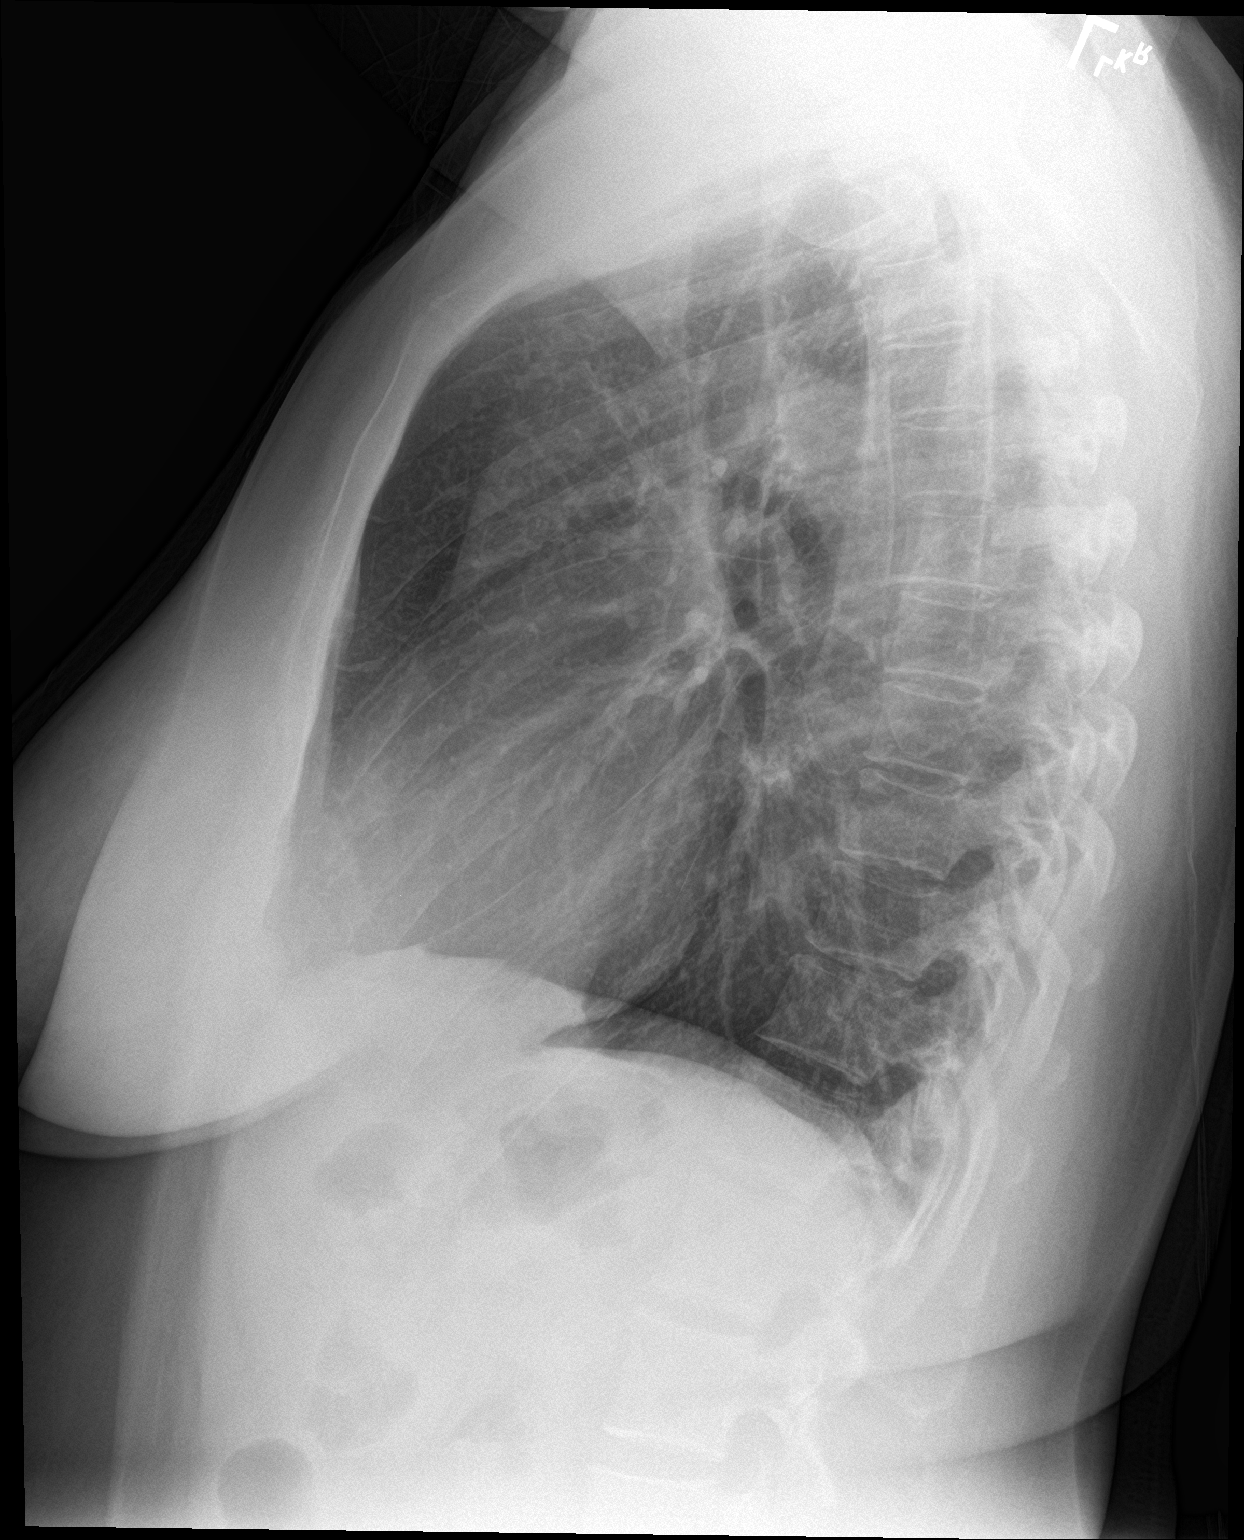

[2 of 2 positions shown; findings below may reference images not displayed]

FINDINGS: The heart size and mediastinal contours are within normal limits.
Both lungs are clear. The visualized skeletal structures are
unremarkable.
IMPRESSION: No active cardiopulmonary disease.

## 2019-04-04 ENCOUNTER — Other Ambulatory Visit: Payer: Self-pay | Admitting: Internal Medicine

## 2020-05-24 DIAGNOSIS — Z419 Encounter for procedure for purposes other than remedying health state, unspecified: Secondary | ICD-10-CM | POA: Diagnosis not present

## 2020-06-24 DIAGNOSIS — Z419 Encounter for procedure for purposes other than remedying health state, unspecified: Secondary | ICD-10-CM | POA: Diagnosis not present

## 2020-07-25 DIAGNOSIS — Z419 Encounter for procedure for purposes other than remedying health state, unspecified: Secondary | ICD-10-CM | POA: Diagnosis not present

## 2020-08-24 DIAGNOSIS — Z419 Encounter for procedure for purposes other than remedying health state, unspecified: Secondary | ICD-10-CM | POA: Diagnosis not present

## 2020-09-19 ENCOUNTER — Ambulatory Visit
Admit: 2020-09-19 | Discharge: 2020-09-19 | Disposition: A | Discharge status: Home / Self Care | Payer: PRIVATE HEALTH INSURANCE | Attending: Family

## 2020-09-19 ENCOUNTER — Emergency Department
Admit: 2020-09-19 | Discharge: 2020-09-19 | Disposition: A | Discharge status: Home / Self Care | Payer: PRIVATE HEALTH INSURANCE | Attending: Family

## 2020-09-19 DIAGNOSIS — N644 Mastodynia: Secondary | ICD-10-CM | POA: Diagnosis not present

## 2020-09-19 DIAGNOSIS — R079 Chest pain, unspecified: Secondary | ICD-10-CM | POA: Diagnosis not present

## 2020-09-19 DIAGNOSIS — F17210 Nicotine dependence, cigarettes, uncomplicated: Secondary | ICD-10-CM | POA: Diagnosis not present

## 2020-09-19 DIAGNOSIS — R0789 Other chest pain: Secondary | ICD-10-CM | POA: Diagnosis not present

## 2020-09-19 DIAGNOSIS — R9431 Abnormal electrocardiogram [ECG] [EKG]: Secondary | ICD-10-CM | POA: Diagnosis not present

## 2020-09-24 DIAGNOSIS — Z419 Encounter for procedure for purposes other than remedying health state, unspecified: Secondary | ICD-10-CM | POA: Diagnosis not present

## 2020-09-25 ENCOUNTER — Ambulatory Visit: Admit: 2020-09-25 | Discharge: 2020-09-26 | Payer: PRIVATE HEALTH INSURANCE

## 2020-09-25 DIAGNOSIS — R079 Chest pain, unspecified: Secondary | ICD-10-CM | POA: Diagnosis not present

## 2020-10-24 DIAGNOSIS — Z419 Encounter for procedure for purposes other than remedying health state, unspecified: Secondary | ICD-10-CM | POA: Diagnosis not present

## 2020-11-24 DIAGNOSIS — Z419 Encounter for procedure for purposes other than remedying health state, unspecified: Secondary | ICD-10-CM | POA: Diagnosis not present

## 2020-12-04 ENCOUNTER — Ambulatory Visit: Admit: 2020-12-04 | Discharge: 2020-12-05 | Payer: PRIVATE HEALTH INSURANCE

## 2020-12-04 DIAGNOSIS — I10 Essential (primary) hypertension: Principal | ICD-10-CM

## 2020-12-04 DIAGNOSIS — Z720 Tobacco use: Principal | ICD-10-CM

## 2020-12-04 DIAGNOSIS — K219 Gastro-esophageal reflux disease without esophagitis: Principal | ICD-10-CM

## 2020-12-04 MED ORDER — OMEPRAZOLE 40 MG CAPSULE,DELAYED RELEASE
ORAL_CAPSULE | Freq: Every day | ORAL | 3 refills | 30.00000 days | Status: CP
Start: 2020-12-04 — End: 2021-12-04

## 2020-12-04 MED ORDER — HYDROCHLOROTHIAZIDE 25 MG TABLET
ORAL_TABLET | Freq: Every day | ORAL | 11 refills | 30 days | Status: CP
Start: 2020-12-04 — End: 2021-12-04

## 2020-12-04 MED ORDER — NICOTINE 21 MG/24 HR DAILY TRANSDERMAL PATCH
MEDICATED_PATCH | TRANSDERMAL | 0 refills | 42 days | Status: CP
Start: 2020-12-04 — End: 2021-01-15

## 2020-12-18 DIAGNOSIS — H5213 Myopia, bilateral: Secondary | ICD-10-CM | POA: Diagnosis not present

## 2020-12-25 DIAGNOSIS — Z419 Encounter for procedure for purposes other than remedying health state, unspecified: Secondary | ICD-10-CM | POA: Diagnosis not present

## 2021-01-02 ENCOUNTER — Ambulatory Visit: Admit: 2021-01-02 | Payer: PRIVATE HEALTH INSURANCE

## 2021-01-16 DIAGNOSIS — H524 Presbyopia: Secondary | ICD-10-CM | POA: Diagnosis not present

## 2021-01-22 DIAGNOSIS — Z419 Encounter for procedure for purposes other than remedying health state, unspecified: Secondary | ICD-10-CM | POA: Diagnosis not present

## 2021-02-22 DIAGNOSIS — Z419 Encounter for procedure for purposes other than remedying health state, unspecified: Secondary | ICD-10-CM | POA: Diagnosis not present

## 2021-03-24 DIAGNOSIS — Z419 Encounter for procedure for purposes other than remedying health state, unspecified: Secondary | ICD-10-CM | POA: Diagnosis not present

## 2021-04-24 DIAGNOSIS — Z419 Encounter for procedure for purposes other than remedying health state, unspecified: Secondary | ICD-10-CM | POA: Diagnosis not present

## 2021-04-26 ENCOUNTER — Ambulatory Visit: Admit: 2021-04-26 | Discharge: 2021-04-27 | Payer: PRIVATE HEALTH INSURANCE

## 2021-04-26 DIAGNOSIS — Z6835 Body mass index (BMI) 35.0-35.9, adult: Secondary | ICD-10-CM | POA: Diagnosis not present

## 2021-04-26 DIAGNOSIS — G8929 Other chronic pain: Secondary | ICD-10-CM | POA: Diagnosis not present

## 2021-04-26 DIAGNOSIS — M21069 Valgus deformity, not elsewhere classified, unspecified knee: Secondary | ICD-10-CM | POA: Diagnosis not present

## 2021-04-26 DIAGNOSIS — M25562 Pain in left knee: Secondary | ICD-10-CM | POA: Diagnosis not present

## 2021-04-26 DIAGNOSIS — M5440 Lumbago with sciatica, unspecified side: Secondary | ICD-10-CM | POA: Diagnosis not present

## 2021-04-26 DIAGNOSIS — M25561 Pain in right knee: Secondary | ICD-10-CM | POA: Diagnosis not present

## 2021-04-26 DIAGNOSIS — I10 Essential (primary) hypertension: Secondary | ICD-10-CM | POA: Diagnosis not present

## 2021-04-26 MED ORDER — CYCLOBENZAPRINE 10 MG TABLET
ORAL_TABLET | Freq: Every evening | ORAL | 1 refills | 30 days | Status: CP | PRN
Start: 2021-04-26 — End: ?

## 2021-05-06 ENCOUNTER — Emergency Department
Admit: 2021-05-06 | Discharge: 2021-05-07 | Disposition: A | Discharge status: Home / Self Care | Payer: PRIVATE HEALTH INSURANCE | Attending: Emergency Medicine

## 2021-05-06 ENCOUNTER — Ambulatory Visit
Admit: 2021-05-06 | Discharge: 2021-05-07 | Disposition: A | Discharge status: Home / Self Care | Payer: PRIVATE HEALTH INSURANCE | Attending: Emergency Medicine

## 2021-05-06 DIAGNOSIS — M25561 Pain in right knee: Secondary | ICD-10-CM | POA: Diagnosis not present

## 2021-05-06 DIAGNOSIS — M25471 Effusion, right ankle: Secondary | ICD-10-CM | POA: Diagnosis not present

## 2021-05-06 DIAGNOSIS — Z90710 Acquired absence of both cervix and uterus: Secondary | ICD-10-CM | POA: Diagnosis not present

## 2021-05-06 DIAGNOSIS — F17210 Nicotine dependence, cigarettes, uncomplicated: Secondary | ICD-10-CM | POA: Diagnosis not present

## 2021-05-06 DIAGNOSIS — K219 Gastro-esophageal reflux disease without esophagitis: Secondary | ICD-10-CM | POA: Diagnosis not present

## 2021-05-06 DIAGNOSIS — Z7982 Long term (current) use of aspirin: Secondary | ICD-10-CM | POA: Diagnosis not present

## 2021-05-06 DIAGNOSIS — M2341 Loose body in knee, right knee: Secondary | ICD-10-CM | POA: Diagnosis not present

## 2021-05-06 DIAGNOSIS — Z043 Encounter for examination and observation following other accident: Secondary | ICD-10-CM | POA: Diagnosis not present

## 2021-05-06 DIAGNOSIS — M7989 Other specified soft tissue disorders: Secondary | ICD-10-CM | POA: Diagnosis not present

## 2021-05-06 DIAGNOSIS — I10 Essential (primary) hypertension: Secondary | ICD-10-CM | POA: Diagnosis not present

## 2021-05-06 DIAGNOSIS — M25571 Pain in right ankle and joints of right foot: Secondary | ICD-10-CM | POA: Diagnosis not present

## 2021-05-06 DIAGNOSIS — M1711 Unilateral primary osteoarthritis, right knee: Secondary | ICD-10-CM | POA: Diagnosis not present

## 2021-05-06 DIAGNOSIS — Z79899 Other long term (current) drug therapy: Secondary | ICD-10-CM | POA: Diagnosis not present

## 2021-05-06 DIAGNOSIS — W19XXXA Unspecified fall, initial encounter: Principal | ICD-10-CM

## 2021-05-24 DIAGNOSIS — Z419 Encounter for procedure for purposes other than remedying health state, unspecified: Secondary | ICD-10-CM | POA: Diagnosis not present

## 2021-06-03 ENCOUNTER — Other Ambulatory Visit: Admit: 2021-06-03 | Discharge: 2021-06-04 | Payer: PRIVATE HEALTH INSURANCE

## 2021-06-03 DIAGNOSIS — I10 Essential (primary) hypertension: Secondary | ICD-10-CM | POA: Diagnosis not present

## 2021-06-03 DIAGNOSIS — Z6835 Body mass index (BMI) 35.0-35.9, adult: Secondary | ICD-10-CM | POA: Diagnosis not present

## 2021-06-04 ENCOUNTER — Ambulatory Visit
Admit: 2021-06-04 | Payer: PRIVATE HEALTH INSURANCE | Attending: Rehabilitative and Restorative Service Providers" | Primary: Rehabilitative and Restorative Service Providers"

## 2021-06-06 ENCOUNTER — Ambulatory Visit: Admit: 2021-06-06 | Discharge: 2021-06-07 | Payer: PRIVATE HEALTH INSURANCE

## 2021-06-06 DIAGNOSIS — I10 Essential (primary) hypertension: Secondary | ICD-10-CM | POA: Diagnosis not present

## 2021-06-06 DIAGNOSIS — Z720 Tobacco use: Principal | ICD-10-CM

## 2021-06-06 MED ORDER — LOSARTAN 50 MG TABLET
ORAL_TABLET | Freq: Every day | ORAL | 3 refills | 90.00000 days | Status: CP
Start: 2021-06-06 — End: 2022-06-06

## 2021-06-06 MED ORDER — NICOTINE 10 MG INHALATION CARTRIDGE
RESPIRATORY_TRACT | 2 refills | 28.00000 days | Status: CP | PRN
Start: 2021-06-06 — End: 2021-07-06

## 2021-06-06 MED ORDER — BUPROPION HCL XL 150 MG 24 HR TABLET, EXTENDED RELEASE
ORAL_TABLET | Freq: Every morning | ORAL | 2 refills | 30 days | Status: CP
Start: 2021-06-06 — End: 2022-06-06

## 2021-06-07 MED ORDER — NICOTINE 21 MG/24 HR DAILY TRANSDERMAL PATCH
MEDICATED_PATCH | TRANSDERMAL | 3 refills | 28.00000 days | Status: CP
Start: 2021-06-07 — End: ?

## 2021-06-24 DIAGNOSIS — Z419 Encounter for procedure for purposes other than remedying health state, unspecified: Secondary | ICD-10-CM | POA: Diagnosis not present

## 2021-07-18 ENCOUNTER — Ambulatory Visit: Admit: 2021-07-18 | Payer: PRIVATE HEALTH INSURANCE

## 2021-07-25 DIAGNOSIS — Z419 Encounter for procedure for purposes other than remedying health state, unspecified: Secondary | ICD-10-CM | POA: Diagnosis not present

## 2021-08-01 ENCOUNTER — Ambulatory Visit: Admit: 2021-08-01 | Discharge: 2021-08-02 | Payer: PRIVATE HEALTH INSURANCE

## 2021-08-01 DIAGNOSIS — Z87891 Personal history of nicotine dependence: Secondary | ICD-10-CM | POA: Diagnosis not present

## 2021-08-01 DIAGNOSIS — M25562 Pain in left knee: Secondary | ICD-10-CM | POA: Diagnosis not present

## 2021-08-01 DIAGNOSIS — M170 Bilateral primary osteoarthritis of knee: Secondary | ICD-10-CM | POA: Diagnosis not present

## 2021-08-01 DIAGNOSIS — R262 Difficulty in walking, not elsewhere classified: Secondary | ICD-10-CM | POA: Diagnosis not present

## 2021-08-01 DIAGNOSIS — M25462 Effusion, left knee: Secondary | ICD-10-CM | POA: Diagnosis not present

## 2021-08-01 DIAGNOSIS — M21061 Valgus deformity, not elsewhere classified, right knee: Secondary | ICD-10-CM | POA: Diagnosis not present

## 2021-08-01 DIAGNOSIS — M25461 Effusion, right knee: Secondary | ICD-10-CM | POA: Diagnosis not present

## 2021-08-07 DIAGNOSIS — K219 Gastro-esophageal reflux disease without esophagitis: Principal | ICD-10-CM

## 2021-08-08 MED ORDER — OMEPRAZOLE 40 MG CAPSULE,DELAYED RELEASE
ORAL_CAPSULE | Freq: Every day | ORAL | 3 refills | 30 days | Status: CP
Start: 2021-08-08 — End: 2022-08-08

## 2021-08-19 ENCOUNTER — Ambulatory Visit: Admit: 2021-08-19 | Discharge: 2021-08-20 | Payer: PRIVATE HEALTH INSURANCE

## 2021-08-19 DIAGNOSIS — M1711 Unilateral primary osteoarthritis, right knee: Secondary | ICD-10-CM | POA: Diagnosis not present

## 2021-08-24 DIAGNOSIS — Z419 Encounter for procedure for purposes other than remedying health state, unspecified: Secondary | ICD-10-CM | POA: Diagnosis not present

## 2021-09-24 DIAGNOSIS — Z419 Encounter for procedure for purposes other than remedying health state, unspecified: Secondary | ICD-10-CM | POA: Diagnosis not present

## 2021-10-22 ENCOUNTER — Ambulatory Visit
Admit: 2021-10-22 | Discharge: 2021-10-23 | Payer: PRIVATE HEALTH INSURANCE | Attending: Physician Assistant | Primary: Physician Assistant

## 2021-10-22 DIAGNOSIS — M170 Bilateral primary osteoarthritis of knee: Secondary | ICD-10-CM | POA: Diagnosis not present

## 2021-10-24 ENCOUNTER — Ambulatory Visit: Admit: 2021-10-24 | Payer: PRIVATE HEALTH INSURANCE

## 2021-10-24 DIAGNOSIS — Z419 Encounter for procedure for purposes other than remedying health state, unspecified: Secondary | ICD-10-CM | POA: Diagnosis not present

## 2021-10-31 ENCOUNTER — Ambulatory Visit: Admit: 2021-10-31 | Discharge: 2021-11-01 | Payer: PRIVATE HEALTH INSURANCE

## 2021-10-31 DIAGNOSIS — M170 Bilateral primary osteoarthritis of knee: Secondary | ICD-10-CM | POA: Diagnosis not present

## 2021-11-14 ENCOUNTER — Ambulatory Visit: Admit: 2021-11-14 | Payer: PRIVATE HEALTH INSURANCE

## 2021-11-24 DIAGNOSIS — Z419 Encounter for procedure for purposes other than remedying health state, unspecified: Secondary | ICD-10-CM | POA: Diagnosis not present

## 2021-12-25 DIAGNOSIS — Z419 Encounter for procedure for purposes other than remedying health state, unspecified: Secondary | ICD-10-CM | POA: Diagnosis not present

## 2022-01-22 DIAGNOSIS — Z419 Encounter for procedure for purposes other than remedying health state, unspecified: Secondary | ICD-10-CM | POA: Diagnosis not present

## 2022-01-27 ENCOUNTER — Ambulatory Visit: Admit: 2022-01-27 | Discharge: 2022-01-28 | Payer: PRIVATE HEALTH INSURANCE

## 2022-01-27 DIAGNOSIS — N6323 Unspecified lump in the left breast, lower outer quadrant: Secondary | ICD-10-CM | POA: Diagnosis not present

## 2022-01-27 DIAGNOSIS — R2232 Localized swelling, mass and lump, left upper limb: Secondary | ICD-10-CM | POA: Diagnosis not present

## 2022-01-27 MED ORDER — DOXYCYCLINE HYCLATE 100 MG CAPSULE
ORAL_CAPSULE | Freq: Two times a day (BID) | ORAL | 0 refills | 7 days | Status: CP
Start: 2022-01-27 — End: 2022-02-03

## 2022-02-12 ENCOUNTER — Ambulatory Visit: Admit: 2022-02-12 | Discharge: 2022-02-13 | Payer: PRIVATE HEALTH INSURANCE

## 2022-02-12 DIAGNOSIS — N6323 Unspecified lump in the left breast, lower outer quadrant: Secondary | ICD-10-CM | POA: Diagnosis not present

## 2022-02-12 DIAGNOSIS — N6313 Unspecified lump in the right breast, lower outer quadrant: Secondary | ICD-10-CM | POA: Diagnosis not present

## 2022-02-12 DIAGNOSIS — R2232 Localized swelling, mass and lump, left upper limb: Secondary | ICD-10-CM | POA: Diagnosis not present

## 2022-02-12 DIAGNOSIS — N6315 Unspecified lump in the right breast, overlapping quadrants: Secondary | ICD-10-CM | POA: Diagnosis not present

## 2022-02-12 DIAGNOSIS — N6324 Unspecified lump in the left breast, lower inner quadrant: Secondary | ICD-10-CM | POA: Diagnosis not present

## 2022-02-12 DIAGNOSIS — N6311 Unspecified lump in the right breast, upper outer quadrant: Secondary | ICD-10-CM | POA: Diagnosis not present

## 2022-02-12 DIAGNOSIS — N6325 Unspecified lump in the left breast, overlapping quadrants: Secondary | ICD-10-CM | POA: Diagnosis not present

## 2022-02-13 DIAGNOSIS — R928 Other abnormal and inconclusive findings on diagnostic imaging of breast: Principal | ICD-10-CM

## 2022-02-22 DIAGNOSIS — Z419 Encounter for procedure for purposes other than remedying health state, unspecified: Secondary | ICD-10-CM | POA: Diagnosis not present

## 2022-02-24 ENCOUNTER — Ambulatory Visit
Admit: 2022-02-24 | Discharge: 2022-03-13 | Payer: PRIVATE HEALTH INSURANCE | Attending: Radiation Oncology | Primary: Radiation Oncology

## 2022-02-26 ENCOUNTER — Ambulatory Visit: Admit: 2022-02-26 | Discharge: 2022-02-26 | Payer: PRIVATE HEALTH INSURANCE

## 2022-03-03 ENCOUNTER — Ambulatory Visit: Admit: 2022-03-03 | Discharge: 2022-03-04 | Payer: PRIVATE HEALTH INSURANCE

## 2022-03-03 DIAGNOSIS — R928 Other abnormal and inconclusive findings on diagnostic imaging of breast: Secondary | ICD-10-CM | POA: Diagnosis not present

## 2022-03-03 DIAGNOSIS — C773 Secondary and unspecified malignant neoplasm of axilla and upper limb lymph nodes: Secondary | ICD-10-CM | POA: Diagnosis not present

## 2022-03-03 DIAGNOSIS — N6324 Unspecified lump in the left breast, lower inner quadrant: Secondary | ICD-10-CM | POA: Diagnosis not present

## 2022-03-03 DIAGNOSIS — C50912 Malignant neoplasm of unspecified site of left female breast: Secondary | ICD-10-CM | POA: Diagnosis not present

## 2022-03-03 DIAGNOSIS — N6341 Unspecified lump in right breast, subareolar: Secondary | ICD-10-CM | POA: Diagnosis not present

## 2022-03-03 DIAGNOSIS — N6323 Unspecified lump in the left breast, lower outer quadrant: Secondary | ICD-10-CM | POA: Diagnosis not present

## 2022-03-03 DIAGNOSIS — R590 Localized enlarged lymph nodes: Secondary | ICD-10-CM | POA: Diagnosis not present

## 2022-03-03 DIAGNOSIS — C50812 Malignant neoplasm of overlapping sites of left female breast: Secondary | ICD-10-CM | POA: Diagnosis not present

## 2022-03-03 DIAGNOSIS — N6081 Other benign mammary dysplasias of right breast: Secondary | ICD-10-CM | POA: Diagnosis not present

## 2022-03-05 DIAGNOSIS — C50912 Malignant neoplasm of unspecified site of left female breast: Principal | ICD-10-CM

## 2022-03-06 DIAGNOSIS — C50912 Malignant neoplasm of unspecified site of left female breast: Principal | ICD-10-CM

## 2022-03-12 ENCOUNTER — Ambulatory Visit: Admit: 2022-03-12 | Discharge: 2022-03-13 | Payer: PRIVATE HEALTH INSURANCE

## 2022-03-12 DIAGNOSIS — C50912 Malignant neoplasm of unspecified site of left female breast: Secondary | ICD-10-CM | POA: Diagnosis not present

## 2022-03-12 DIAGNOSIS — Z170 Estrogen receptor positive status [ER+]: Secondary | ICD-10-CM | POA: Diagnosis not present

## 2022-03-12 DIAGNOSIS — N6323 Unspecified lump in the left breast, lower outer quadrant: Principal | ICD-10-CM

## 2022-03-19 ENCOUNTER — Ambulatory Visit: Admit: 2022-03-19 | Discharge: 2022-03-20 | Payer: PRIVATE HEALTH INSURANCE

## 2022-03-19 DIAGNOSIS — C50912 Malignant neoplasm of unspecified site of left female breast: Secondary | ICD-10-CM | POA: Diagnosis not present

## 2022-03-21 DIAGNOSIS — C50912 Malignant neoplasm of unspecified site of left female breast: Principal | ICD-10-CM

## 2022-03-21 DIAGNOSIS — G8929 Other chronic pain: Principal | ICD-10-CM

## 2022-03-21 DIAGNOSIS — M5440 Lumbago with sciatica, unspecified side: Principal | ICD-10-CM

## 2022-03-21 DIAGNOSIS — K219 Gastro-esophageal reflux disease without esophagitis: Principal | ICD-10-CM

## 2022-03-21 DIAGNOSIS — Z170 Estrogen receptor positive status [ER+]: Principal | ICD-10-CM

## 2022-03-24 ENCOUNTER — Ambulatory Visit: Admit: 2022-03-24 | Payer: PRIVATE HEALTH INSURANCE | Attending: Radiation Oncology | Primary: Radiation Oncology

## 2022-03-24 ENCOUNTER — Ambulatory Visit
Admit: 2022-03-24 | Discharge: 2022-03-27 | Payer: PRIVATE HEALTH INSURANCE | Attending: Radiation Oncology | Primary: Radiation Oncology

## 2022-03-24 ENCOUNTER — Ambulatory Visit
Admit: 2022-03-24 | Discharge: 2022-04-12 | Payer: PRIVATE HEALTH INSURANCE | Attending: Radiation Oncology | Primary: Radiation Oncology

## 2022-03-24 ENCOUNTER — Ambulatory Visit: Admit: 2022-03-24 | Discharge: 2022-03-25 | Payer: PRIVATE HEALTH INSURANCE

## 2022-03-24 DIAGNOSIS — Z419 Encounter for procedure for purposes other than remedying health state, unspecified: Secondary | ICD-10-CM | POA: Diagnosis not present

## 2022-03-25 ENCOUNTER — Ambulatory Visit: Admit: 2022-03-25 | Discharge: 2022-03-27 | Payer: PRIVATE HEALTH INSURANCE

## 2022-03-25 ENCOUNTER — Ambulatory Visit: Admit: 2022-03-25 | Discharge: 2022-03-26 | Payer: PRIVATE HEALTH INSURANCE

## 2022-03-25 DIAGNOSIS — C50912 Malignant neoplasm of unspecified site of left female breast: Secondary | ICD-10-CM | POA: Diagnosis not present

## 2022-03-25 DIAGNOSIS — C773 Secondary and unspecified malignant neoplasm of axilla and upper limb lymph nodes: Secondary | ICD-10-CM | POA: Diagnosis not present

## 2022-03-25 DIAGNOSIS — R590 Localized enlarged lymph nodes: Secondary | ICD-10-CM | POA: Diagnosis not present

## 2022-03-25 DIAGNOSIS — Z170 Estrogen receptor positive status [ER+]: Secondary | ICD-10-CM | POA: Diagnosis not present

## 2022-03-25 DIAGNOSIS — R9389 Abnormal findings on diagnostic imaging of other specified body structures: Secondary | ICD-10-CM | POA: Diagnosis not present

## 2022-03-25 DIAGNOSIS — Z7689 Persons encountering health services in other specified circumstances: Secondary | ICD-10-CM | POA: Diagnosis not present

## 2022-03-25 DIAGNOSIS — C770 Secondary and unspecified malignant neoplasm of lymph nodes of head, face and neck: Secondary | ICD-10-CM | POA: Diagnosis not present

## 2022-03-25 DIAGNOSIS — C50812 Malignant neoplasm of overlapping sites of left female breast: Secondary | ICD-10-CM | POA: Diagnosis not present

## 2022-03-25 MED ORDER — OMEPRAZOLE 40 MG CAPSULE,DELAYED RELEASE
ORAL_CAPSULE | Freq: Every day | ORAL | 3 refills | 30 days | Status: CP
Start: 2022-03-25 — End: 2023-03-25

## 2022-03-25 MED ORDER — LOSARTAN 50 MG TABLET
ORAL_TABLET | Freq: Every day | ORAL | 3 refills | 90 days | Status: CP
Start: 2022-03-25 — End: 2023-03-25

## 2022-03-25 MED ORDER — CYCLOBENZAPRINE 10 MG TABLET
ORAL_TABLET | Freq: Every evening | ORAL | 1 refills | 30 days | Status: CP | PRN
Start: 2022-03-25 — End: ?

## 2022-03-25 MED ORDER — ALBUTEROL SULFATE HFA 90 MCG/ACTUATION AEROSOL INHALER
Freq: Four times a day (QID) | RESPIRATORY_TRACT | 2 refills | 0 days | Status: CP | PRN
Start: 2022-03-25 — End: ?

## 2022-03-25 MED ORDER — BUPROPION HCL XL 150 MG 24 HR TABLET, EXTENDED RELEASE
ORAL_TABLET | Freq: Every morning | ORAL | 2 refills | 30 days | Status: CP
Start: 2022-03-25 — End: 2023-03-25

## 2022-03-25 MED ORDER — NICOTINE 21 MG/24 HR DAILY TRANSDERMAL PATCH
MEDICATED_PATCH | TRANSDERMAL | 3 refills | 28 days | Status: CP
Start: 2022-03-25 — End: ?

## 2022-03-26 ENCOUNTER — Ambulatory Visit: Admit: 2022-03-26 | Discharge: 2022-03-27 | Payer: PRIVATE HEALTH INSURANCE

## 2022-03-26 ENCOUNTER — Other Ambulatory Visit: Admit: 2022-03-26 | Discharge: 2022-03-27 | Payer: PRIVATE HEALTH INSURANCE

## 2022-03-26 DIAGNOSIS — Z7689 Persons encountering health services in other specified circumstances: Secondary | ICD-10-CM | POA: Diagnosis not present

## 2022-03-26 DIAGNOSIS — K219 Gastro-esophageal reflux disease without esophagitis: Secondary | ICD-10-CM | POA: Diagnosis not present

## 2022-03-26 DIAGNOSIS — Z510 Encounter for antineoplastic radiation therapy: Secondary | ICD-10-CM | POA: Diagnosis not present

## 2022-03-26 DIAGNOSIS — N6031 Fibrosclerosis of right breast: Secondary | ICD-10-CM | POA: Diagnosis not present

## 2022-03-26 DIAGNOSIS — Z006 Encounter for examination for normal comparison and control in clinical research program: Secondary | ICD-10-CM | POA: Diagnosis not present

## 2022-03-26 DIAGNOSIS — R590 Localized enlarged lymph nodes: Secondary | ICD-10-CM | POA: Diagnosis not present

## 2022-03-26 DIAGNOSIS — N6001 Solitary cyst of right breast: Secondary | ICD-10-CM | POA: Diagnosis not present

## 2022-03-26 DIAGNOSIS — I10 Essential (primary) hypertension: Secondary | ICD-10-CM | POA: Diagnosis not present

## 2022-03-26 DIAGNOSIS — F1290 Cannabis use, unspecified, uncomplicated: Secondary | ICD-10-CM | POA: Diagnosis not present

## 2022-03-26 DIAGNOSIS — C50912 Malignant neoplasm of unspecified site of left female breast: Secondary | ICD-10-CM | POA: Diagnosis not present

## 2022-03-26 DIAGNOSIS — F419 Anxiety disorder, unspecified: Secondary | ICD-10-CM | POA: Diagnosis not present

## 2022-03-26 DIAGNOSIS — F17210 Nicotine dependence, cigarettes, uncomplicated: Secondary | ICD-10-CM | POA: Diagnosis not present

## 2022-03-26 DIAGNOSIS — N6459 Other signs and symptoms in breast: Secondary | ICD-10-CM | POA: Diagnosis not present

## 2022-03-26 DIAGNOSIS — C50812 Malignant neoplasm of overlapping sites of left female breast: Secondary | ICD-10-CM | POA: Diagnosis not present

## 2022-03-26 DIAGNOSIS — Z170 Estrogen receptor positive status [ER+]: Secondary | ICD-10-CM | POA: Diagnosis not present

## 2022-03-27 DIAGNOSIS — C50912 Malignant neoplasm of unspecified site of left female breast: Principal | ICD-10-CM

## 2022-04-01 DIAGNOSIS — Z170 Estrogen receptor positive status [ER+]: Principal | ICD-10-CM

## 2022-04-01 DIAGNOSIS — C50912 Malignant neoplasm of unspecified site of left female breast: Principal | ICD-10-CM

## 2022-04-07 DIAGNOSIS — Z170 Estrogen receptor positive status [ER+]: Principal | ICD-10-CM

## 2022-04-07 DIAGNOSIS — C50912 Malignant neoplasm of unspecified site of left female breast: Principal | ICD-10-CM

## 2022-04-11 ENCOUNTER — Other Ambulatory Visit: Admit: 2022-04-11 | Discharge: 2022-04-12 | Payer: PRIVATE HEALTH INSURANCE

## 2022-04-11 DIAGNOSIS — F1290 Cannabis use, unspecified, uncomplicated: Secondary | ICD-10-CM | POA: Diagnosis not present

## 2022-04-11 DIAGNOSIS — R590 Localized enlarged lymph nodes: Secondary | ICD-10-CM | POA: Diagnosis not present

## 2022-04-11 DIAGNOSIS — F419 Anxiety disorder, unspecified: Secondary | ICD-10-CM | POA: Diagnosis not present

## 2022-04-11 DIAGNOSIS — F17210 Nicotine dependence, cigarettes, uncomplicated: Secondary | ICD-10-CM | POA: Diagnosis not present

## 2022-04-11 DIAGNOSIS — Z510 Encounter for antineoplastic radiation therapy: Secondary | ICD-10-CM | POA: Diagnosis not present

## 2022-04-11 DIAGNOSIS — Z006 Encounter for examination for normal comparison and control in clinical research program: Secondary | ICD-10-CM | POA: Diagnosis not present

## 2022-04-11 DIAGNOSIS — K219 Gastro-esophageal reflux disease without esophagitis: Secondary | ICD-10-CM | POA: Diagnosis not present

## 2022-04-11 DIAGNOSIS — N6459 Other signs and symptoms in breast: Secondary | ICD-10-CM | POA: Diagnosis not present

## 2022-04-11 DIAGNOSIS — N6001 Solitary cyst of right breast: Secondary | ICD-10-CM | POA: Diagnosis not present

## 2022-04-11 DIAGNOSIS — I10 Essential (primary) hypertension: Secondary | ICD-10-CM | POA: Diagnosis not present

## 2022-04-11 DIAGNOSIS — N6031 Fibrosclerosis of right breast: Secondary | ICD-10-CM | POA: Diagnosis not present

## 2022-04-11 DIAGNOSIS — C50912 Malignant neoplasm of unspecified site of left female breast: Secondary | ICD-10-CM | POA: Diagnosis not present

## 2022-04-11 DIAGNOSIS — Z170 Estrogen receptor positive status [ER+]: Secondary | ICD-10-CM | POA: Diagnosis not present

## 2022-04-18 DIAGNOSIS — Z170 Estrogen receptor positive status [ER+]: Principal | ICD-10-CM

## 2022-04-18 DIAGNOSIS — R2232 Localized swelling, mass and lump, left upper limb: Principal | ICD-10-CM

## 2022-04-18 DIAGNOSIS — C50912 Malignant neoplasm of unspecified site of left female breast: Principal | ICD-10-CM

## 2022-04-18 DIAGNOSIS — N6323 Unspecified lump in the left breast, lower outer quadrant: Principal | ICD-10-CM

## 2022-04-22 ENCOUNTER — Ambulatory Visit: Admit: 2022-04-22 | Discharge: 2022-04-23 | Payer: PRIVATE HEALTH INSURANCE

## 2022-04-22 DIAGNOSIS — C50912 Malignant neoplasm of unspecified site of left female breast: Secondary | ICD-10-CM | POA: Diagnosis not present

## 2022-04-22 DIAGNOSIS — Z170 Estrogen receptor positive status [ER+]: Secondary | ICD-10-CM | POA: Diagnosis not present

## 2022-04-22 DIAGNOSIS — I3489 Other nonrheumatic mitral valve disorders: Secondary | ICD-10-CM | POA: Diagnosis not present

## 2022-04-23 ENCOUNTER — Other Ambulatory Visit: Admit: 2022-04-23 | Discharge: 2022-04-23 | Payer: PRIVATE HEALTH INSURANCE

## 2022-04-23 ENCOUNTER — Ambulatory Visit: Admit: 2022-04-23 | Discharge: 2022-04-23 | Payer: PRIVATE HEALTH INSURANCE

## 2022-04-23 DIAGNOSIS — Z452 Encounter for adjustment and management of vascular access device: Secondary | ICD-10-CM | POA: Diagnosis not present

## 2022-04-23 DIAGNOSIS — Z170 Estrogen receptor positive status [ER+]: Secondary | ICD-10-CM | POA: Diagnosis not present

## 2022-04-23 DIAGNOSIS — N6459 Other signs and symptoms in breast: Secondary | ICD-10-CM | POA: Diagnosis not present

## 2022-04-23 DIAGNOSIS — Z9221 Personal history of antineoplastic chemotherapy: Secondary | ICD-10-CM | POA: Diagnosis not present

## 2022-04-23 DIAGNOSIS — I10 Essential (primary) hypertension: Secondary | ICD-10-CM | POA: Diagnosis not present

## 2022-04-23 DIAGNOSIS — F17210 Nicotine dependence, cigarettes, uncomplicated: Secondary | ICD-10-CM | POA: Diagnosis not present

## 2022-04-23 DIAGNOSIS — Z7982 Long term (current) use of aspirin: Secondary | ICD-10-CM | POA: Diagnosis not present

## 2022-04-23 DIAGNOSIS — R590 Localized enlarged lymph nodes: Secondary | ICD-10-CM | POA: Diagnosis not present

## 2022-04-23 DIAGNOSIS — F419 Anxiety disorder, unspecified: Secondary | ICD-10-CM | POA: Diagnosis not present

## 2022-04-23 DIAGNOSIS — Z803 Family history of malignant neoplasm of breast: Secondary | ICD-10-CM | POA: Diagnosis not present

## 2022-04-23 DIAGNOSIS — Z006 Encounter for examination for normal comparison and control in clinical research program: Secondary | ICD-10-CM | POA: Diagnosis not present

## 2022-04-23 DIAGNOSIS — C50912 Malignant neoplasm of unspecified site of left female breast: Secondary | ICD-10-CM | POA: Diagnosis not present

## 2022-04-23 DIAGNOSIS — N6310 Unspecified lump in the right breast, unspecified quadrant: Secondary | ICD-10-CM | POA: Diagnosis not present

## 2022-04-23 DIAGNOSIS — N6323 Unspecified lump in the left breast, lower outer quadrant: Principal | ICD-10-CM

## 2022-04-23 DIAGNOSIS — R2232 Localized swelling, mass and lump, left upper limb: Principal | ICD-10-CM

## 2022-04-23 MED ORDER — PROCHLORPERAZINE MALEATE 10 MG TABLET
ORAL_TABLET | Freq: Four times a day (QID) | ORAL | 2 refills | 8 days | Status: CP | PRN
Start: 2022-04-23 — End: ?

## 2022-04-23 MED ORDER — ONDANSETRON HCL 8 MG TABLET
ORAL_TABLET | Freq: Two times a day (BID) | ORAL | 5 refills | 15 days | Status: CP
Start: 2022-04-23 — End: ?

## 2022-04-24 ENCOUNTER — Ambulatory Visit: Admit: 2022-04-24 | Payer: PRIVATE HEALTH INSURANCE | Attending: Radiation Oncology | Primary: Radiation Oncology

## 2022-04-24 ENCOUNTER — Ambulatory Visit: Admit: 2022-04-24 | Payer: PRIVATE HEALTH INSURANCE

## 2022-04-24 ENCOUNTER — Ambulatory Visit: Admit: 2022-04-24 | Payer: PRIVATE HEALTH INSURANCE | Attending: MS" | Primary: MS"

## 2022-04-24 DIAGNOSIS — Z419 Encounter for procedure for purposes other than remedying health state, unspecified: Secondary | ICD-10-CM | POA: Diagnosis not present

## 2022-05-07 ENCOUNTER — Ambulatory Visit: Admit: 2022-05-07 | Discharge: 2022-05-07 | Payer: PRIVATE HEALTH INSURANCE

## 2022-05-07 ENCOUNTER — Other Ambulatory Visit: Admit: 2022-05-07 | Discharge: 2022-05-07 | Payer: PRIVATE HEALTH INSURANCE

## 2022-05-07 DIAGNOSIS — N6459 Other signs and symptoms in breast: Secondary | ICD-10-CM | POA: Diagnosis not present

## 2022-05-07 DIAGNOSIS — C50912 Malignant neoplasm of unspecified site of left female breast: Secondary | ICD-10-CM | POA: Diagnosis not present

## 2022-05-07 DIAGNOSIS — Z006 Encounter for examination for normal comparison and control in clinical research program: Secondary | ICD-10-CM | POA: Diagnosis not present

## 2022-05-07 DIAGNOSIS — Z170 Estrogen receptor positive status [ER+]: Secondary | ICD-10-CM | POA: Diagnosis not present

## 2022-05-07 DIAGNOSIS — F17210 Nicotine dependence, cigarettes, uncomplicated: Secondary | ICD-10-CM | POA: Diagnosis not present

## 2022-05-07 DIAGNOSIS — R590 Localized enlarged lymph nodes: Secondary | ICD-10-CM | POA: Diagnosis not present

## 2022-05-07 DIAGNOSIS — Z7982 Long term (current) use of aspirin: Secondary | ICD-10-CM | POA: Diagnosis not present

## 2022-05-07 DIAGNOSIS — Z803 Family history of malignant neoplasm of breast: Secondary | ICD-10-CM | POA: Diagnosis not present

## 2022-05-07 DIAGNOSIS — I10 Essential (primary) hypertension: Secondary | ICD-10-CM | POA: Diagnosis not present

## 2022-05-07 DIAGNOSIS — Z9221 Personal history of antineoplastic chemotherapy: Secondary | ICD-10-CM | POA: Diagnosis not present

## 2022-05-07 DIAGNOSIS — N6323 Unspecified lump in the left breast, lower outer quadrant: Secondary | ICD-10-CM | POA: Diagnosis not present

## 2022-05-07 DIAGNOSIS — R2232 Localized swelling, mass and lump, left upper limb: Principal | ICD-10-CM

## 2022-05-07 MED ORDER — LIDOCAINE-PRILOCAINE 2.5 %-2.5 % TOPICAL CREAM
Freq: Once | TOPICAL | 2 refills | 0 days | Status: CP
Start: 2022-05-07 — End: 2022-05-08
  Filled 2022-05-07: qty 25, 25d supply, fill #0

## 2022-05-14 ENCOUNTER — Other Ambulatory Visit: Admit: 2022-05-14 | Discharge: 2022-05-14 | Payer: PRIVATE HEALTH INSURANCE

## 2022-05-14 ENCOUNTER — Ambulatory Visit: Admit: 2022-05-14 | Discharge: 2022-05-14 | Payer: PRIVATE HEALTH INSURANCE

## 2022-05-14 DIAGNOSIS — Z5111 Encounter for antineoplastic chemotherapy: Secondary | ICD-10-CM | POA: Diagnosis not present

## 2022-05-14 DIAGNOSIS — C50912 Malignant neoplasm of unspecified site of left female breast: Secondary | ICD-10-CM | POA: Diagnosis not present

## 2022-05-14 DIAGNOSIS — Z170 Estrogen receptor positive status [ER+]: Secondary | ICD-10-CM | POA: Diagnosis not present

## 2022-05-14 DIAGNOSIS — Z006 Encounter for examination for normal comparison and control in clinical research program: Secondary | ICD-10-CM | POA: Diagnosis not present

## 2022-05-21 ENCOUNTER — Ambulatory Visit: Admit: 2022-05-21 | Discharge: 2022-05-21 | Payer: PRIVATE HEALTH INSURANCE

## 2022-05-21 ENCOUNTER — Other Ambulatory Visit: Admit: 2022-05-21 | Discharge: 2022-05-21 | Payer: PRIVATE HEALTH INSURANCE

## 2022-05-21 DIAGNOSIS — C50912 Malignant neoplasm of unspecified site of left female breast: Secondary | ICD-10-CM | POA: Diagnosis not present

## 2022-05-21 DIAGNOSIS — Z170 Estrogen receptor positive status [ER+]: Secondary | ICD-10-CM | POA: Diagnosis not present

## 2022-05-21 DIAGNOSIS — Z006 Encounter for examination for normal comparison and control in clinical research program: Secondary | ICD-10-CM | POA: Diagnosis not present

## 2022-05-21 DIAGNOSIS — Z5111 Encounter for antineoplastic chemotherapy: Secondary | ICD-10-CM | POA: Diagnosis not present

## 2022-05-24 DIAGNOSIS — Z419 Encounter for procedure for purposes other than remedying health state, unspecified: Secondary | ICD-10-CM | POA: Diagnosis not present

## 2022-05-28 ENCOUNTER — Other Ambulatory Visit: Admit: 2022-05-28 | Discharge: 2022-05-28 | Payer: PRIVATE HEALTH INSURANCE

## 2022-05-28 ENCOUNTER — Ambulatory Visit: Admit: 2022-05-28 | Discharge: 2022-05-28 | Payer: PRIVATE HEALTH INSURANCE

## 2022-05-28 DIAGNOSIS — C50912 Malignant neoplasm of unspecified site of left female breast: Secondary | ICD-10-CM | POA: Diagnosis not present

## 2022-05-28 DIAGNOSIS — Z170 Estrogen receptor positive status [ER+]: Secondary | ICD-10-CM | POA: Diagnosis not present

## 2022-05-28 DIAGNOSIS — Z5111 Encounter for antineoplastic chemotherapy: Secondary | ICD-10-CM | POA: Diagnosis not present

## 2022-05-28 DIAGNOSIS — Z006 Encounter for examination for normal comparison and control in clinical research program: Secondary | ICD-10-CM | POA: Diagnosis not present

## 2022-06-04 ENCOUNTER — Ambulatory Visit: Admit: 2022-06-04 | Discharge: 2022-06-05 | Payer: PRIVATE HEALTH INSURANCE

## 2022-06-04 ENCOUNTER — Other Ambulatory Visit: Admit: 2022-06-04 | Discharge: 2022-06-05 | Payer: PRIVATE HEALTH INSURANCE

## 2022-06-04 DIAGNOSIS — R599 Enlarged lymph nodes, unspecified: Secondary | ICD-10-CM | POA: Diagnosis not present

## 2022-06-04 DIAGNOSIS — C50912 Malignant neoplasm of unspecified site of left female breast: Secondary | ICD-10-CM | POA: Diagnosis not present

## 2022-06-04 DIAGNOSIS — I10 Essential (primary) hypertension: Secondary | ICD-10-CM | POA: Diagnosis not present

## 2022-06-04 DIAGNOSIS — Z5112 Encounter for antineoplastic immunotherapy: Secondary | ICD-10-CM | POA: Diagnosis not present

## 2022-06-04 DIAGNOSIS — K30 Functional dyspepsia: Secondary | ICD-10-CM | POA: Diagnosis not present

## 2022-06-04 DIAGNOSIS — Z5111 Encounter for antineoplastic chemotherapy: Secondary | ICD-10-CM | POA: Diagnosis not present

## 2022-06-04 DIAGNOSIS — Z006 Encounter for examination for normal comparison and control in clinical research program: Secondary | ICD-10-CM | POA: Diagnosis not present

## 2022-06-04 DIAGNOSIS — N6009 Solitary cyst of unspecified breast: Secondary | ICD-10-CM | POA: Diagnosis not present

## 2022-06-04 DIAGNOSIS — Z720 Tobacco use: Secondary | ICD-10-CM | POA: Diagnosis not present

## 2022-06-04 DIAGNOSIS — Z170 Estrogen receptor positive status [ER+]: Secondary | ICD-10-CM | POA: Diagnosis not present

## 2022-06-04 DIAGNOSIS — R21 Rash and other nonspecific skin eruption: Secondary | ICD-10-CM | POA: Diagnosis not present

## 2022-06-04 DIAGNOSIS — F419 Anxiety disorder, unspecified: Secondary | ICD-10-CM | POA: Diagnosis not present

## 2022-06-04 DIAGNOSIS — L299 Pruritus, unspecified: Secondary | ICD-10-CM | POA: Diagnosis not present

## 2022-06-04 DIAGNOSIS — B372 Candidiasis of skin and nail: Secondary | ICD-10-CM | POA: Diagnosis not present

## 2022-06-04 MED ORDER — FLUCONAZOLE 150 MG TABLET
ORAL_TABLET | Freq: Once | ORAL | 0 refills | 1.00000 days | Status: CP
Start: 2022-06-04 — End: 2022-06-04
  Filled 2022-06-04: qty 1, 1d supply, fill #0

## 2022-06-04 MED ORDER — NYSTATIN 100,000 UNIT/GRAM TOPICAL POWDER
0 refills | 0 days | Status: CP
Start: 2022-06-04 — End: 2023-06-04
  Filled 2022-06-04: qty 15, 7d supply, fill #0

## 2022-06-06 DIAGNOSIS — C50912 Malignant neoplasm of unspecified site of left female breast: Principal | ICD-10-CM

## 2022-06-06 DIAGNOSIS — Z170 Estrogen receptor positive status [ER+]: Principal | ICD-10-CM

## 2022-06-09 DIAGNOSIS — C50912 Malignant neoplasm of unspecified site of left female breast: Principal | ICD-10-CM

## 2022-06-09 DIAGNOSIS — Z170 Estrogen receptor positive status [ER+]: Principal | ICD-10-CM

## 2022-06-11 ENCOUNTER — Ambulatory Visit: Admit: 2022-06-11 | Discharge: 2022-06-11 | Payer: PRIVATE HEALTH INSURANCE

## 2022-06-11 DIAGNOSIS — Z170 Estrogen receptor positive status [ER+]: Secondary | ICD-10-CM | POA: Diagnosis not present

## 2022-06-11 DIAGNOSIS — C50912 Malignant neoplasm of unspecified site of left female breast: Secondary | ICD-10-CM | POA: Diagnosis not present

## 2022-06-11 DIAGNOSIS — Z5111 Encounter for antineoplastic chemotherapy: Secondary | ICD-10-CM | POA: Diagnosis not present

## 2022-06-11 DIAGNOSIS — Z006 Encounter for examination for normal comparison and control in clinical research program: Secondary | ICD-10-CM | POA: Diagnosis not present

## 2022-06-11 MED ORDER — LORATADINE 10 MG TABLET
ORAL_TABLET | Freq: Every day | ORAL | 2 refills | 30 days | Status: CP
Start: 2022-06-11 — End: 2023-06-11

## 2022-06-11 MED ORDER — TRIAMCINOLONE ACETONIDE 0.1 % TOPICAL CREAM
Freq: Two times a day (BID) | TOPICAL | 0 refills | 0 days | Status: CP
Start: 2022-06-11 — End: 2023-06-11

## 2022-06-18 ENCOUNTER — Ambulatory Visit: Admit: 2022-06-18 | Discharge: 2022-06-18 | Payer: PRIVATE HEALTH INSURANCE

## 2022-06-18 DIAGNOSIS — C50912 Malignant neoplasm of unspecified site of left female breast: Secondary | ICD-10-CM | POA: Diagnosis not present

## 2022-06-18 DIAGNOSIS — Z006 Encounter for examination for normal comparison and control in clinical research program: Secondary | ICD-10-CM | POA: Diagnosis not present

## 2022-06-18 DIAGNOSIS — Z170 Estrogen receptor positive status [ER+]: Secondary | ICD-10-CM | POA: Diagnosis not present

## 2022-06-18 DIAGNOSIS — Z5111 Encounter for antineoplastic chemotherapy: Secondary | ICD-10-CM | POA: Diagnosis not present

## 2022-06-18 MED ORDER — TRIAMCINOLONE ACETONIDE 0.1 % TOPICAL CREAM
Freq: Two times a day (BID) | TOPICAL | 0 refills | 0 days | Status: CP
Start: 2022-06-18 — End: 2023-06-18

## 2022-06-24 DIAGNOSIS — Z419 Encounter for procedure for purposes other than remedying health state, unspecified: Secondary | ICD-10-CM | POA: Diagnosis not present

## 2022-06-25 ENCOUNTER — Other Ambulatory Visit: Admit: 2022-06-25 | Discharge: 2022-06-26 | Payer: PRIVATE HEALTH INSURANCE

## 2022-06-25 ENCOUNTER — Ambulatory Visit: Admit: 2022-06-25 | Discharge: 2022-06-26 | Payer: PRIVATE HEALTH INSURANCE

## 2022-06-25 DIAGNOSIS — Z803 Family history of malignant neoplasm of breast: Secondary | ICD-10-CM | POA: Diagnosis not present

## 2022-06-25 DIAGNOSIS — I10 Essential (primary) hypertension: Secondary | ICD-10-CM | POA: Diagnosis not present

## 2022-06-25 DIAGNOSIS — F17200 Nicotine dependence, unspecified, uncomplicated: Secondary | ICD-10-CM | POA: Diagnosis not present

## 2022-06-25 DIAGNOSIS — Z5111 Encounter for antineoplastic chemotherapy: Secondary | ICD-10-CM | POA: Diagnosis not present

## 2022-06-25 DIAGNOSIS — R21 Rash and other nonspecific skin eruption: Secondary | ICD-10-CM | POA: Diagnosis not present

## 2022-06-25 DIAGNOSIS — Z800 Family history of malignant neoplasm of digestive organs: Secondary | ICD-10-CM | POA: Diagnosis not present

## 2022-06-25 DIAGNOSIS — Z170 Estrogen receptor positive status [ER+]: Secondary | ICD-10-CM | POA: Diagnosis not present

## 2022-06-25 DIAGNOSIS — Z8041 Family history of malignant neoplasm of ovary: Secondary | ICD-10-CM | POA: Diagnosis not present

## 2022-06-25 DIAGNOSIS — L0232 Furuncle of buttock: Secondary | ICD-10-CM | POA: Diagnosis not present

## 2022-06-25 DIAGNOSIS — L0291 Cutaneous abscess, unspecified: Secondary | ICD-10-CM | POA: Diagnosis not present

## 2022-06-25 DIAGNOSIS — L02422 Furuncle of left axilla: Secondary | ICD-10-CM | POA: Diagnosis not present

## 2022-06-25 DIAGNOSIS — C50912 Malignant neoplasm of unspecified site of left female breast: Secondary | ICD-10-CM | POA: Diagnosis not present

## 2022-06-25 DIAGNOSIS — Z006 Encounter for examination for normal comparison and control in clinical research program: Secondary | ICD-10-CM | POA: Diagnosis not present

## 2022-06-25 DIAGNOSIS — Z7982 Long term (current) use of aspirin: Secondary | ICD-10-CM | POA: Diagnosis not present

## 2022-06-25 MED ORDER — DOXYCYCLINE HYCLATE 100 MG CAPSULE
ORAL_CAPSULE | Freq: Two times a day (BID) | ORAL | 0 refills | 7 days | Status: CP
Start: 2022-06-25 — End: ?
  Filled 2022-06-25: qty 14, 7d supply, fill #0

## 2022-06-27 DIAGNOSIS — Z170 Estrogen receptor positive status [ER+]: Principal | ICD-10-CM

## 2022-06-27 DIAGNOSIS — C50912 Malignant neoplasm of unspecified site of left female breast: Principal | ICD-10-CM

## 2022-06-27 DIAGNOSIS — L0291 Cutaneous abscess, unspecified: Principal | ICD-10-CM

## 2022-06-27 MED ORDER — AMOXICILLIN 875 MG-POTASSIUM CLAVULANATE 125 MG TABLET
ORAL_TABLET | Freq: Two times a day (BID) | ORAL | 0 refills | 10 days | Status: CP
Start: 2022-06-27 — End: 2022-07-07

## 2022-06-27 MED ORDER — MUPIROCIN 2 % TOPICAL OINTMENT
Freq: Two times a day (BID) | TOPICAL | 0 refills | 7 days | Status: CP
Start: 2022-06-27 — End: 2022-07-04

## 2022-07-02 ENCOUNTER — Ambulatory Visit: Admit: 2022-07-02 | Discharge: 2022-07-02 | Payer: PRIVATE HEALTH INSURANCE

## 2022-07-02 DIAGNOSIS — Z170 Estrogen receptor positive status [ER+]: Secondary | ICD-10-CM | POA: Diagnosis not present

## 2022-07-02 DIAGNOSIS — Z5111 Encounter for antineoplastic chemotherapy: Secondary | ICD-10-CM | POA: Diagnosis not present

## 2022-07-02 DIAGNOSIS — C50912 Malignant neoplasm of unspecified site of left female breast: Secondary | ICD-10-CM | POA: Diagnosis not present

## 2022-07-02 DIAGNOSIS — Z006 Encounter for examination for normal comparison and control in clinical research program: Secondary | ICD-10-CM | POA: Diagnosis not present

## 2022-07-07 ENCOUNTER — Telehealth: Admit: 2022-07-07 | Payer: PRIVATE HEALTH INSURANCE | Attending: Children | Primary: Children

## 2022-07-08 ENCOUNTER — Ambulatory Visit: Admit: 2022-07-08 | Discharge: 2022-07-09 | Payer: PRIVATE HEALTH INSURANCE

## 2022-07-08 DIAGNOSIS — N760 Acute vaginitis: Secondary | ICD-10-CM | POA: Diagnosis not present

## 2022-07-08 DIAGNOSIS — Z1211 Encounter for screening for malignant neoplasm of colon: Secondary | ICD-10-CM | POA: Diagnosis not present

## 2022-07-08 DIAGNOSIS — S31819A Unspecified open wound of right buttock, initial encounter: Secondary | ICD-10-CM | POA: Diagnosis not present

## 2022-07-08 DIAGNOSIS — K644 Residual hemorrhoidal skin tags: Secondary | ICD-10-CM | POA: Diagnosis not present

## 2022-07-08 DIAGNOSIS — L0231 Cutaneous abscess of buttock: Secondary | ICD-10-CM | POA: Diagnosis not present

## 2022-07-08 DIAGNOSIS — K921 Melena: Secondary | ICD-10-CM | POA: Diagnosis not present

## 2022-07-08 MED ORDER — FLUCONAZOLE 150 MG TABLET
ORAL_TABLET | 0 refills | 0 days | Status: CP
Start: 2022-07-08 — End: ?

## 2022-07-08 MED ORDER — HYDROCORTISONE 2.5 % TOPICAL CREAM WITH PERINEAL APPLICATOR
Freq: Two times a day (BID) | RECTAL | 5 refills | 0 days | Status: CP
Start: 2022-07-08 — End: ?

## 2022-07-10 ENCOUNTER — Ambulatory Visit
Admit: 2022-07-10 | Discharge: 2022-07-11 | Payer: PRIVATE HEALTH INSURANCE | Attending: Trauma Surgery | Primary: Trauma Surgery

## 2022-07-10 DIAGNOSIS — L0231 Cutaneous abscess of buttock: Secondary | ICD-10-CM | POA: Diagnosis not present

## 2022-07-10 DIAGNOSIS — S31819A Unspecified open wound of right buttock, initial encounter: Secondary | ICD-10-CM | POA: Diagnosis not present

## 2022-07-14 ENCOUNTER — Ambulatory Visit: Admit: 2022-07-14 | Discharge: 2022-07-15 | Payer: PRIVATE HEALTH INSURANCE

## 2022-07-14 DIAGNOSIS — L0231 Cutaneous abscess of buttock: Secondary | ICD-10-CM | POA: Diagnosis not present

## 2022-07-14 DIAGNOSIS — Z803 Family history of malignant neoplasm of breast: Secondary | ICD-10-CM | POA: Diagnosis not present

## 2022-07-14 DIAGNOSIS — Z800 Family history of malignant neoplasm of digestive organs: Secondary | ICD-10-CM | POA: Diagnosis not present

## 2022-07-14 DIAGNOSIS — Z8042 Family history of malignant neoplasm of prostate: Secondary | ICD-10-CM | POA: Diagnosis not present

## 2022-07-14 DIAGNOSIS — Z8041 Family history of malignant neoplasm of ovary: Secondary | ICD-10-CM | POA: Diagnosis not present

## 2022-07-14 DIAGNOSIS — Z79899 Other long term (current) drug therapy: Secondary | ICD-10-CM | POA: Diagnosis not present

## 2022-07-14 DIAGNOSIS — K626 Ulcer of anus and rectum: Secondary | ICD-10-CM | POA: Diagnosis not present

## 2022-07-14 DIAGNOSIS — F17210 Nicotine dependence, cigarettes, uncomplicated: Secondary | ICD-10-CM | POA: Diagnosis not present

## 2022-07-14 DIAGNOSIS — I10 Essential (primary) hypertension: Secondary | ICD-10-CM | POA: Diagnosis not present

## 2022-07-14 DIAGNOSIS — S31819A Unspecified open wound of right buttock, initial encounter: Secondary | ICD-10-CM | POA: Diagnosis not present

## 2022-07-14 DIAGNOSIS — Z7982 Long term (current) use of aspirin: Secondary | ICD-10-CM | POA: Diagnosis not present

## 2022-07-15 DIAGNOSIS — C50912 Malignant neoplasm of unspecified site of left female breast: Principal | ICD-10-CM

## 2022-07-15 DIAGNOSIS — Z170 Estrogen receptor positive status [ER+]: Principal | ICD-10-CM

## 2022-07-16 ENCOUNTER — Ambulatory Visit: Admit: 2022-07-16 | Discharge: 2022-07-16 | Payer: PRIVATE HEALTH INSURANCE

## 2022-07-16 ENCOUNTER — Other Ambulatory Visit: Admit: 2022-07-16 | Discharge: 2022-07-16 | Payer: PRIVATE HEALTH INSURANCE

## 2022-07-16 DIAGNOSIS — L02412 Cutaneous abscess of left axilla: Secondary | ICD-10-CM | POA: Diagnosis not present

## 2022-07-16 DIAGNOSIS — L0291 Cutaneous abscess, unspecified: Secondary | ICD-10-CM | POA: Diagnosis not present

## 2022-07-16 DIAGNOSIS — Z800 Family history of malignant neoplasm of digestive organs: Secondary | ICD-10-CM | POA: Diagnosis not present

## 2022-07-16 DIAGNOSIS — Z792 Long term (current) use of antibiotics: Secondary | ICD-10-CM | POA: Diagnosis not present

## 2022-07-16 DIAGNOSIS — F17200 Nicotine dependence, unspecified, uncomplicated: Secondary | ICD-10-CM | POA: Diagnosis not present

## 2022-07-16 DIAGNOSIS — Z006 Encounter for examination for normal comparison and control in clinical research program: Secondary | ICD-10-CM | POA: Diagnosis not present

## 2022-07-16 DIAGNOSIS — Z8041 Family history of malignant neoplasm of ovary: Secondary | ICD-10-CM | POA: Diagnosis not present

## 2022-07-16 DIAGNOSIS — Z803 Family history of malignant neoplasm of breast: Secondary | ICD-10-CM | POA: Diagnosis not present

## 2022-07-16 DIAGNOSIS — Z7982 Long term (current) use of aspirin: Secondary | ICD-10-CM | POA: Diagnosis not present

## 2022-07-16 DIAGNOSIS — L02422 Furuncle of left axilla: Secondary | ICD-10-CM | POA: Diagnosis not present

## 2022-07-16 DIAGNOSIS — I10 Essential (primary) hypertension: Secondary | ICD-10-CM | POA: Diagnosis not present

## 2022-07-16 DIAGNOSIS — Z170 Estrogen receptor positive status [ER+]: Secondary | ICD-10-CM | POA: Diagnosis not present

## 2022-07-16 DIAGNOSIS — C50912 Malignant neoplasm of unspecified site of left female breast: Secondary | ICD-10-CM | POA: Diagnosis not present

## 2022-07-16 MED ORDER — AMOXICILLIN 875 MG-POTASSIUM CLAVULANATE 125 MG TABLET
ORAL_TABLET | Freq: Two times a day (BID) | ORAL | 0 refills | 7 days | Status: CP
Start: 2022-07-16 — End: 2022-07-23

## 2022-07-21 DIAGNOSIS — Z170 Estrogen receptor positive status [ER+]: Principal | ICD-10-CM

## 2022-07-21 DIAGNOSIS — C50912 Malignant neoplasm of unspecified site of left female breast: Principal | ICD-10-CM

## 2022-07-23 ENCOUNTER — Ambulatory Visit: Admit: 2022-07-23 | Discharge: 2022-07-23 | Payer: PRIVATE HEALTH INSURANCE

## 2022-07-23 ENCOUNTER — Other Ambulatory Visit: Admit: 2022-07-23 | Discharge: 2022-07-23 | Payer: PRIVATE HEALTH INSURANCE

## 2022-07-23 DIAGNOSIS — Z006 Encounter for examination for normal comparison and control in clinical research program: Secondary | ICD-10-CM | POA: Diagnosis not present

## 2022-07-23 DIAGNOSIS — Z170 Estrogen receptor positive status [ER+]: Secondary | ICD-10-CM | POA: Diagnosis not present

## 2022-07-23 DIAGNOSIS — Z7982 Long term (current) use of aspirin: Secondary | ICD-10-CM | POA: Diagnosis not present

## 2022-07-23 DIAGNOSIS — Z923 Personal history of irradiation: Secondary | ICD-10-CM | POA: Diagnosis not present

## 2022-07-23 DIAGNOSIS — Z5111 Encounter for antineoplastic chemotherapy: Secondary | ICD-10-CM | POA: Diagnosis not present

## 2022-07-23 DIAGNOSIS — Z8041 Family history of malignant neoplasm of ovary: Secondary | ICD-10-CM | POA: Diagnosis not present

## 2022-07-23 DIAGNOSIS — Z8042 Family history of malignant neoplasm of prostate: Secondary | ICD-10-CM | POA: Diagnosis not present

## 2022-07-23 DIAGNOSIS — C50912 Malignant neoplasm of unspecified site of left female breast: Secondary | ICD-10-CM | POA: Diagnosis not present

## 2022-07-23 DIAGNOSIS — I10 Essential (primary) hypertension: Secondary | ICD-10-CM | POA: Diagnosis not present

## 2022-07-23 DIAGNOSIS — Z7952 Long term (current) use of systemic steroids: Secondary | ICD-10-CM | POA: Diagnosis not present

## 2022-07-23 DIAGNOSIS — F17200 Nicotine dependence, unspecified, uncomplicated: Secondary | ICD-10-CM | POA: Diagnosis not present

## 2022-07-23 DIAGNOSIS — Z800 Family history of malignant neoplasm of digestive organs: Secondary | ICD-10-CM | POA: Diagnosis not present

## 2022-07-23 DIAGNOSIS — Z803 Family history of malignant neoplasm of breast: Secondary | ICD-10-CM | POA: Diagnosis not present

## 2022-07-25 DIAGNOSIS — Z419 Encounter for procedure for purposes other than remedying health state, unspecified: Secondary | ICD-10-CM | POA: Diagnosis not present

## 2022-07-30 ENCOUNTER — Ambulatory Visit: Admit: 2022-07-30 | Discharge: 2022-07-30 | Payer: PRIVATE HEALTH INSURANCE | Attending: MS" | Primary: MS"

## 2022-07-30 ENCOUNTER — Ambulatory Visit: Admit: 2022-07-30 | Discharge: 2022-07-30 | Payer: PRIVATE HEALTH INSURANCE

## 2022-07-30 DIAGNOSIS — C50912 Malignant neoplasm of unspecified site of left female breast: Secondary | ICD-10-CM | POA: Diagnosis not present

## 2022-07-30 DIAGNOSIS — Z5112 Encounter for antineoplastic immunotherapy: Secondary | ICD-10-CM | POA: Diagnosis not present

## 2022-07-30 DIAGNOSIS — Z006 Encounter for examination for normal comparison and control in clinical research program: Secondary | ICD-10-CM | POA: Diagnosis not present

## 2022-07-30 DIAGNOSIS — S31819A Unspecified open wound of right buttock, initial encounter: Secondary | ICD-10-CM | POA: Diagnosis not present

## 2022-07-30 DIAGNOSIS — Z170 Estrogen receptor positive status [ER+]: Secondary | ICD-10-CM | POA: Diagnosis not present

## 2022-08-06 ENCOUNTER — Ambulatory Visit: Admit: 2022-08-06 | Discharge: 2022-08-07 | Payer: PRIVATE HEALTH INSURANCE

## 2022-08-06 ENCOUNTER — Other Ambulatory Visit: Admit: 2022-08-06 | Discharge: 2022-08-07 | Payer: PRIVATE HEALTH INSURANCE

## 2022-08-06 DIAGNOSIS — Z170 Estrogen receptor positive status [ER+]: Secondary | ICD-10-CM | POA: Diagnosis not present

## 2022-08-06 DIAGNOSIS — C50912 Malignant neoplasm of unspecified site of left female breast: Secondary | ICD-10-CM | POA: Diagnosis not present

## 2022-08-06 DIAGNOSIS — Z006 Encounter for examination for normal comparison and control in clinical research program: Secondary | ICD-10-CM | POA: Diagnosis not present

## 2022-08-06 DIAGNOSIS — Z5111 Encounter for antineoplastic chemotherapy: Secondary | ICD-10-CM | POA: Diagnosis not present

## 2022-08-13 ENCOUNTER — Other Ambulatory Visit: Admit: 2022-08-13 | Discharge: 2022-08-13 | Payer: PRIVATE HEALTH INSURANCE

## 2022-08-13 ENCOUNTER — Ambulatory Visit: Admit: 2022-08-13 | Discharge: 2022-08-13 | Payer: PRIVATE HEALTH INSURANCE

## 2022-08-13 DIAGNOSIS — Z170 Estrogen receptor positive status [ER+]: Secondary | ICD-10-CM | POA: Diagnosis not present

## 2022-08-13 DIAGNOSIS — C50912 Malignant neoplasm of unspecified site of left female breast: Secondary | ICD-10-CM | POA: Diagnosis not present

## 2022-08-13 DIAGNOSIS — Z006 Encounter for examination for normal comparison and control in clinical research program: Secondary | ICD-10-CM | POA: Diagnosis not present

## 2022-08-13 DIAGNOSIS — Z5111 Encounter for antineoplastic chemotherapy: Secondary | ICD-10-CM | POA: Diagnosis not present

## 2022-08-13 MED ORDER — OLANZAPINE 5 MG TABLET
ORAL_TABLET | Freq: Every evening | ORAL | 0 refills | 16.00000 days | Status: CP
Start: 2022-08-13 — End: 2022-09-12

## 2022-08-13 MED ORDER — PROCHLORPERAZINE MALEATE 10 MG TABLET
ORAL_TABLET | Freq: Four times a day (QID) | ORAL | 2 refills | 8.00000 days | Status: CP | PRN
Start: 2022-08-13 — End: ?

## 2022-08-13 MED ORDER — LORAZEPAM 0.5 MG TABLET
ORAL_TABLET | Freq: Every evening | ORAL | 0 refills | 12.00000 days | Status: CP | PRN
Start: 2022-08-13 — End: ?

## 2022-08-18 DIAGNOSIS — C50912 Malignant neoplasm of unspecified site of left female breast: Principal | ICD-10-CM

## 2022-08-18 DIAGNOSIS — Z170 Estrogen receptor positive status [ER+]: Principal | ICD-10-CM

## 2022-08-20 ENCOUNTER — Other Ambulatory Visit: Admit: 2022-08-20 | Discharge: 2022-08-21 | Payer: PRIVATE HEALTH INSURANCE

## 2022-08-20 ENCOUNTER — Ambulatory Visit: Admit: 2022-08-20 | Discharge: 2022-08-21 | Payer: PRIVATE HEALTH INSURANCE

## 2022-08-20 DIAGNOSIS — Z170 Estrogen receptor positive status [ER+]: Secondary | ICD-10-CM | POA: Diagnosis not present

## 2022-08-20 DIAGNOSIS — Z5111 Encounter for antineoplastic chemotherapy: Secondary | ICD-10-CM | POA: Diagnosis not present

## 2022-08-20 DIAGNOSIS — C50912 Malignant neoplasm of unspecified site of left female breast: Secondary | ICD-10-CM | POA: Diagnosis not present

## 2022-08-20 DIAGNOSIS — Z79899 Other long term (current) drug therapy: Secondary | ICD-10-CM | POA: Diagnosis not present

## 2022-08-20 DIAGNOSIS — Z006 Encounter for examination for normal comparison and control in clinical research program: Secondary | ICD-10-CM | POA: Diagnosis not present

## 2022-08-20 DIAGNOSIS — C50919 Malignant neoplasm of unspecified site of unspecified female breast: Principal | ICD-10-CM

## 2022-08-20 DIAGNOSIS — T451X5A Adverse effect of antineoplastic and immunosuppressive drugs, initial encounter: Principal | ICD-10-CM

## 2022-08-20 DIAGNOSIS — R2232 Localized swelling, mass and lump, left upper limb: Principal | ICD-10-CM

## 2022-08-20 DIAGNOSIS — R110 Nausea: Principal | ICD-10-CM

## 2022-08-20 MED ORDER — LIDOCAINE-PRILOCAINE 2.5 %-2.5 % TOPICAL CREAM
Freq: Once | TOPICAL | 1 refills | 0 days | Status: CP
Start: 2022-08-20 — End: 2022-08-21
  Filled 2022-08-20: qty 5, 1d supply, fill #0

## 2022-08-20 MED ORDER — OLANZAPINE 5 MG TABLET
ORAL_TABLET | Freq: Every evening | ORAL | 0 refills | 16 days | Status: CP
Start: 2022-08-20 — End: 2022-10-01
  Filled 2022-08-20: qty 4, 42d supply, fill #0

## 2022-08-22 ENCOUNTER — Institutional Professional Consult (permissible substitution): Admit: 2022-08-22 | Discharge: 2022-08-23 | Payer: PRIVATE HEALTH INSURANCE

## 2022-08-22 DIAGNOSIS — Z170 Estrogen receptor positive status [ER+]: Secondary | ICD-10-CM | POA: Diagnosis not present

## 2022-08-22 DIAGNOSIS — C50912 Malignant neoplasm of unspecified site of left female breast: Secondary | ICD-10-CM | POA: Diagnosis not present

## 2022-08-22 DIAGNOSIS — Z006 Encounter for examination for normal comparison and control in clinical research program: Secondary | ICD-10-CM | POA: Diagnosis not present

## 2022-08-22 DIAGNOSIS — Z7689 Persons encountering health services in other specified circumstances: Secondary | ICD-10-CM | POA: Diagnosis not present

## 2022-08-24 DIAGNOSIS — Z419 Encounter for procedure for purposes other than remedying health state, unspecified: Secondary | ICD-10-CM | POA: Diagnosis not present

## 2022-09-03 ENCOUNTER — Ambulatory Visit: Admit: 2022-09-03 | Discharge: 2022-09-04 | Payer: PRIVATE HEALTH INSURANCE

## 2022-09-03 ENCOUNTER — Other Ambulatory Visit: Admit: 2022-09-03 | Discharge: 2022-09-04 | Payer: PRIVATE HEALTH INSURANCE

## 2022-09-03 ENCOUNTER — Encounter: Admit: 2022-09-03 | Discharge: 2022-09-04 | Payer: PRIVATE HEALTH INSURANCE

## 2022-09-03 DIAGNOSIS — D630 Anemia in neoplastic disease: Secondary | ICD-10-CM | POA: Diagnosis not present

## 2022-09-03 DIAGNOSIS — I10 Essential (primary) hypertension: Secondary | ICD-10-CM | POA: Diagnosis not present

## 2022-09-03 DIAGNOSIS — N6459 Other signs and symptoms in breast: Secondary | ICD-10-CM | POA: Diagnosis not present

## 2022-09-03 DIAGNOSIS — F40240 Claustrophobia: Secondary | ICD-10-CM | POA: Diagnosis not present

## 2022-09-03 DIAGNOSIS — G629 Polyneuropathy, unspecified: Secondary | ICD-10-CM | POA: Diagnosis not present

## 2022-09-03 DIAGNOSIS — N6323 Unspecified lump in the left breast, lower outer quadrant: Secondary | ICD-10-CM | POA: Diagnosis not present

## 2022-09-03 DIAGNOSIS — Z006 Encounter for examination for normal comparison and control in clinical research program: Secondary | ICD-10-CM | POA: Diagnosis not present

## 2022-09-03 DIAGNOSIS — R21 Rash and other nonspecific skin eruption: Secondary | ICD-10-CM | POA: Diagnosis not present

## 2022-09-03 DIAGNOSIS — F17210 Nicotine dependence, cigarettes, uncomplicated: Secondary | ICD-10-CM | POA: Diagnosis not present

## 2022-09-03 DIAGNOSIS — C50912 Malignant neoplasm of unspecified site of left female breast: Secondary | ICD-10-CM | POA: Diagnosis not present

## 2022-09-03 DIAGNOSIS — Z170 Estrogen receptor positive status [ER+]: Secondary | ICD-10-CM | POA: Diagnosis not present

## 2022-09-03 DIAGNOSIS — R921 Mammographic calcification found on diagnostic imaging of breast: Secondary | ICD-10-CM | POA: Diagnosis not present

## 2022-09-03 DIAGNOSIS — M79622 Pain in left upper arm: Secondary | ICD-10-CM | POA: Diagnosis not present

## 2022-09-10 ENCOUNTER — Ambulatory Visit: Admit: 2022-09-10 | Discharge: 2022-09-10 | Payer: PRIVATE HEALTH INSURANCE

## 2022-09-10 ENCOUNTER — Other Ambulatory Visit: Admit: 2022-09-10 | Discharge: 2022-09-10 | Payer: PRIVATE HEALTH INSURANCE

## 2022-09-10 DIAGNOSIS — Z170 Estrogen receptor positive status [ER+]: Secondary | ICD-10-CM | POA: Diagnosis not present

## 2022-09-10 DIAGNOSIS — Z5112 Encounter for antineoplastic immunotherapy: Secondary | ICD-10-CM | POA: Diagnosis not present

## 2022-09-10 DIAGNOSIS — C50912 Malignant neoplasm of unspecified site of left female breast: Secondary | ICD-10-CM | POA: Diagnosis not present

## 2022-09-10 DIAGNOSIS — Z006 Encounter for examination for normal comparison and control in clinical research program: Secondary | ICD-10-CM | POA: Diagnosis not present

## 2022-09-10 DIAGNOSIS — Z5111 Encounter for antineoplastic chemotherapy: Secondary | ICD-10-CM | POA: Diagnosis not present

## 2022-09-10 DIAGNOSIS — Z720 Tobacco use: Principal | ICD-10-CM

## 2022-09-12 ENCOUNTER — Institutional Professional Consult (permissible substitution): Admit: 2022-09-12 | Discharge: 2022-09-13 | Payer: PRIVATE HEALTH INSURANCE

## 2022-09-12 ENCOUNTER — Ambulatory Visit: Admit: 2022-09-12 | Discharge: 2022-09-13 | Payer: PRIVATE HEALTH INSURANCE

## 2022-09-12 DIAGNOSIS — Z5111 Encounter for antineoplastic chemotherapy: Secondary | ICD-10-CM | POA: Diagnosis not present

## 2022-09-12 DIAGNOSIS — C50912 Malignant neoplasm of unspecified site of left female breast: Secondary | ICD-10-CM | POA: Diagnosis not present

## 2022-09-12 DIAGNOSIS — Z7689 Persons encountering health services in other specified circumstances: Secondary | ICD-10-CM | POA: Diagnosis not present

## 2022-09-12 DIAGNOSIS — Z006 Encounter for examination for normal comparison and control in clinical research program: Secondary | ICD-10-CM | POA: Diagnosis not present

## 2022-09-12 DIAGNOSIS — Z170 Estrogen receptor positive status [ER+]: Secondary | ICD-10-CM | POA: Diagnosis not present

## 2022-09-19 DIAGNOSIS — C50912 Malignant neoplasm of unspecified site of left female breast: Principal | ICD-10-CM

## 2022-09-19 DIAGNOSIS — Z170 Estrogen receptor positive status [ER+]: Principal | ICD-10-CM

## 2022-09-22 MED ORDER — LIDOCAINE HCL 2 % MUCOSAL SOLUTION
0 refills | 0 days | Status: CP
Start: 2022-09-22 — End: ?

## 2022-09-23 ENCOUNTER — Emergency Department
Admit: 2022-09-23 | Discharge: 2022-09-23 | Disposition: A | Discharge status: Home / Self Care | Payer: PRIVATE HEALTH INSURANCE

## 2022-09-23 ENCOUNTER — Ambulatory Visit
Admit: 2022-09-23 | Discharge: 2022-09-23 | Disposition: A | Discharge status: Home / Self Care | Payer: PRIVATE HEALTH INSURANCE

## 2022-09-23 DIAGNOSIS — C50912 Malignant neoplasm of unspecified site of left female breast: Secondary | ICD-10-CM | POA: Diagnosis not present

## 2022-09-23 DIAGNOSIS — H109 Unspecified conjunctivitis: Secondary | ICD-10-CM | POA: Diagnosis not present

## 2022-09-23 DIAGNOSIS — Z803 Family history of malignant neoplasm of breast: Secondary | ICD-10-CM | POA: Diagnosis not present

## 2022-09-23 DIAGNOSIS — R5381 Other malaise: Secondary | ICD-10-CM | POA: Diagnosis not present

## 2022-09-23 DIAGNOSIS — R000 Tachycardia, unspecified: Secondary | ICD-10-CM | POA: Diagnosis not present

## 2022-09-23 DIAGNOSIS — D649 Anemia, unspecified: Secondary | ICD-10-CM | POA: Diagnosis not present

## 2022-09-23 DIAGNOSIS — D849 Immunodeficiency, unspecified: Secondary | ICD-10-CM | POA: Diagnosis not present

## 2022-09-23 DIAGNOSIS — K219 Gastro-esophageal reflux disease without esophagitis: Secondary | ICD-10-CM | POA: Diagnosis not present

## 2022-09-23 DIAGNOSIS — Z8041 Family history of malignant neoplasm of ovary: Secondary | ICD-10-CM | POA: Diagnosis not present

## 2022-09-23 DIAGNOSIS — I10 Essential (primary) hypertension: Secondary | ICD-10-CM | POA: Diagnosis not present

## 2022-09-23 DIAGNOSIS — Z7982 Long term (current) use of aspirin: Secondary | ICD-10-CM | POA: Diagnosis not present

## 2022-09-23 DIAGNOSIS — R059 Cough, unspecified: Secondary | ICD-10-CM | POA: Diagnosis not present

## 2022-09-23 DIAGNOSIS — L02419 Cutaneous abscess of limb, unspecified: Secondary | ICD-10-CM | POA: Diagnosis not present

## 2022-09-23 DIAGNOSIS — J069 Acute upper respiratory infection, unspecified: Secondary | ICD-10-CM | POA: Diagnosis not present

## 2022-09-23 DIAGNOSIS — Z853 Personal history of malignant neoplasm of breast: Principal | ICD-10-CM

## 2022-09-23 MED ORDER — ERYTHROMYCIN 5 MG/GRAM (0.5 %) EYE OINTMENT
0 refills | 0 days | Status: CP
Start: 2022-09-23 — End: ?

## 2022-09-23 MED ORDER — DOXYCYCLINE HYCLATE 100 MG CAPSULE
ORAL_CAPSULE | Freq: Two times a day (BID) | ORAL | 0 refills | 7 days | Status: CP
Start: 2022-09-23 — End: 2022-09-30

## 2022-09-24 DIAGNOSIS — Z419 Encounter for procedure for purposes other than remedying health state, unspecified: Secondary | ICD-10-CM | POA: Diagnosis not present

## 2022-10-08 ENCOUNTER — Ambulatory Visit: Admit: 2022-10-08 | Discharge: 2022-10-09 | Payer: PRIVATE HEALTH INSURANCE

## 2022-10-08 ENCOUNTER — Other Ambulatory Visit: Admit: 2022-10-08 | Discharge: 2022-10-09 | Payer: PRIVATE HEALTH INSURANCE

## 2022-10-08 DIAGNOSIS — I10 Essential (primary) hypertension: Secondary | ICD-10-CM | POA: Diagnosis not present

## 2022-10-08 DIAGNOSIS — Z5111 Encounter for antineoplastic chemotherapy: Secondary | ICD-10-CM | POA: Diagnosis not present

## 2022-10-08 DIAGNOSIS — G629 Polyneuropathy, unspecified: Secondary | ICD-10-CM | POA: Diagnosis not present

## 2022-10-08 DIAGNOSIS — Z7982 Long term (current) use of aspirin: Secondary | ICD-10-CM | POA: Diagnosis not present

## 2022-10-08 DIAGNOSIS — F17200 Nicotine dependence, unspecified, uncomplicated: Secondary | ICD-10-CM | POA: Diagnosis not present

## 2022-10-08 DIAGNOSIS — N6009 Solitary cyst of unspecified breast: Secondary | ICD-10-CM | POA: Diagnosis not present

## 2022-10-08 DIAGNOSIS — Z79899 Other long term (current) drug therapy: Secondary | ICD-10-CM | POA: Diagnosis not present

## 2022-10-08 DIAGNOSIS — Z170 Estrogen receptor positive status [ER+]: Secondary | ICD-10-CM | POA: Diagnosis not present

## 2022-10-08 DIAGNOSIS — Z006 Encounter for examination for normal comparison and control in clinical research program: Secondary | ICD-10-CM | POA: Diagnosis not present

## 2022-10-08 DIAGNOSIS — R599 Enlarged lymph nodes, unspecified: Secondary | ICD-10-CM | POA: Diagnosis not present

## 2022-10-08 DIAGNOSIS — C50912 Malignant neoplasm of unspecified site of left female breast: Secondary | ICD-10-CM | POA: Diagnosis not present

## 2022-10-11 ENCOUNTER — Ambulatory Visit: Admit: 2022-10-11 | Payer: PRIVATE HEALTH INSURANCE

## 2022-10-15 DIAGNOSIS — Z170 Estrogen receptor positive status [ER+]: Principal | ICD-10-CM

## 2022-10-15 DIAGNOSIS — C50012 Malignant neoplasm of nipple and areola, left female breast: Principal | ICD-10-CM

## 2022-10-20 DIAGNOSIS — C50012 Malignant neoplasm of nipple and areola, left female breast: Secondary | ICD-10-CM | POA: Diagnosis not present

## 2022-10-20 DIAGNOSIS — Z170 Estrogen receptor positive status [ER+]: Secondary | ICD-10-CM | POA: Diagnosis not present

## 2022-10-21 ENCOUNTER — Ambulatory Visit: Admit: 2022-10-21 | Discharge: 2022-10-22 | Payer: PRIVATE HEALTH INSURANCE

## 2022-10-21 DIAGNOSIS — Z170 Estrogen receptor positive status [ER+]: Secondary | ICD-10-CM | POA: Diagnosis not present

## 2022-10-21 DIAGNOSIS — Z853 Personal history of malignant neoplasm of breast: Secondary | ICD-10-CM | POA: Diagnosis not present

## 2022-10-21 DIAGNOSIS — Z08 Encounter for follow-up examination after completed treatment for malignant neoplasm: Secondary | ICD-10-CM | POA: Diagnosis not present

## 2022-10-21 DIAGNOSIS — C50912 Malignant neoplasm of unspecified site of left female breast: Secondary | ICD-10-CM | POA: Diagnosis not present

## 2022-10-22 DIAGNOSIS — Z006 Encounter for examination for normal comparison and control in clinical research program: Secondary | ICD-10-CM | POA: Diagnosis not present

## 2022-10-22 DIAGNOSIS — Z170 Estrogen receptor positive status [ER+]: Secondary | ICD-10-CM | POA: Diagnosis not present

## 2022-10-22 DIAGNOSIS — C50912 Malignant neoplasm of unspecified site of left female breast: Secondary | ICD-10-CM | POA: Diagnosis not present

## 2022-10-22 DIAGNOSIS — F17210 Nicotine dependence, cigarettes, uncomplicated: Secondary | ICD-10-CM | POA: Diagnosis not present

## 2022-10-22 DIAGNOSIS — I10 Essential (primary) hypertension: Secondary | ICD-10-CM | POA: Diagnosis not present

## 2022-10-22 DIAGNOSIS — Z7982 Long term (current) use of aspirin: Secondary | ICD-10-CM | POA: Diagnosis not present

## 2022-10-22 DIAGNOSIS — Z79899 Other long term (current) drug therapy: Secondary | ICD-10-CM | POA: Diagnosis not present

## 2022-10-23 ENCOUNTER — Ambulatory Visit: Admit: 2022-10-23 | Discharge: 2022-10-23 | Payer: PRIVATE HEALTH INSURANCE

## 2022-10-23 ENCOUNTER — Other Ambulatory Visit: Admit: 2022-10-23 | Discharge: 2022-10-23 | Payer: PRIVATE HEALTH INSURANCE

## 2022-10-24 DIAGNOSIS — Z419 Encounter for procedure for purposes other than remedying health state, unspecified: Secondary | ICD-10-CM | POA: Diagnosis not present

## 2022-10-24 DIAGNOSIS — Z170 Estrogen receptor positive status [ER+]: Principal | ICD-10-CM

## 2022-10-24 DIAGNOSIS — C50912 Malignant neoplasm of unspecified site of left female breast: Principal | ICD-10-CM

## 2022-10-29 ENCOUNTER — Other Ambulatory Visit: Admit: 2022-10-29 | Discharge: 2022-10-30 | Payer: PRIVATE HEALTH INSURANCE

## 2022-10-29 ENCOUNTER — Ambulatory Visit: Admit: 2022-10-29 | Discharge: 2022-10-30 | Payer: PRIVATE HEALTH INSURANCE

## 2022-10-29 DIAGNOSIS — G629 Polyneuropathy, unspecified: Secondary | ICD-10-CM | POA: Diagnosis not present

## 2022-10-29 DIAGNOSIS — C50912 Malignant neoplasm of unspecified site of left female breast: Secondary | ICD-10-CM | POA: Diagnosis not present

## 2022-10-29 DIAGNOSIS — N6323 Unspecified lump in the left breast, lower outer quadrant: Secondary | ICD-10-CM | POA: Diagnosis not present

## 2022-10-29 DIAGNOSIS — Z006 Encounter for examination for normal comparison and control in clinical research program: Secondary | ICD-10-CM | POA: Diagnosis not present

## 2022-10-29 DIAGNOSIS — I10 Essential (primary) hypertension: Secondary | ICD-10-CM | POA: Diagnosis not present

## 2022-10-29 DIAGNOSIS — F17210 Nicotine dependence, cigarettes, uncomplicated: Secondary | ICD-10-CM | POA: Diagnosis not present

## 2022-10-29 DIAGNOSIS — Z5111 Encounter for antineoplastic chemotherapy: Secondary | ICD-10-CM | POA: Diagnosis not present

## 2022-10-29 DIAGNOSIS — Z170 Estrogen receptor positive status [ER+]: Secondary | ICD-10-CM | POA: Diagnosis not present

## 2022-10-29 DIAGNOSIS — K0889 Other specified disorders of teeth and supporting structures: Secondary | ICD-10-CM | POA: Diagnosis not present

## 2022-10-31 ENCOUNTER — Ambulatory Visit: Admit: 2022-10-31 | Payer: PRIVATE HEALTH INSURANCE

## 2022-11-01 ENCOUNTER — Ambulatory Visit: Admit: 2022-11-01 | Discharge: 2022-11-02 | Payer: PRIVATE HEALTH INSURANCE

## 2022-11-01 DIAGNOSIS — Z006 Encounter for examination for normal comparison and control in clinical research program: Secondary | ICD-10-CM | POA: Diagnosis not present

## 2022-11-01 DIAGNOSIS — C50912 Malignant neoplasm of unspecified site of left female breast: Secondary | ICD-10-CM | POA: Diagnosis not present

## 2022-11-01 DIAGNOSIS — Z7689 Persons encountering health services in other specified circumstances: Secondary | ICD-10-CM | POA: Diagnosis not present

## 2022-11-01 DIAGNOSIS — Z170 Estrogen receptor positive status [ER+]: Secondary | ICD-10-CM | POA: Diagnosis not present

## 2022-11-24 DIAGNOSIS — Z419 Encounter for procedure for purposes other than remedying health state, unspecified: Secondary | ICD-10-CM | POA: Diagnosis not present

## 2022-11-27 ENCOUNTER — Encounter: Admit: 2022-11-27 | Discharge: 2022-11-28 | Payer: PRIVATE HEALTH INSURANCE

## 2022-11-27 ENCOUNTER — Ambulatory Visit: Admit: 2022-11-27 | Discharge: 2022-11-28 | Payer: PRIVATE HEALTH INSURANCE

## 2022-11-27 DIAGNOSIS — Z6832 Body mass index (BMI) 32.0-32.9, adult: Secondary | ICD-10-CM | POA: Diagnosis not present

## 2022-11-27 DIAGNOSIS — L732 Hidradenitis suppurativa: Secondary | ICD-10-CM | POA: Diagnosis not present

## 2022-11-27 DIAGNOSIS — K219 Gastro-esophageal reflux disease without esophagitis: Secondary | ICD-10-CM | POA: Diagnosis not present

## 2022-11-27 DIAGNOSIS — C773 Secondary and unspecified malignant neoplasm of axilla and upper limb lymph nodes: Secondary | ICD-10-CM | POA: Diagnosis not present

## 2022-11-27 DIAGNOSIS — Z170 Estrogen receptor positive status [ER+]: Secondary | ICD-10-CM | POA: Diagnosis not present

## 2022-11-27 DIAGNOSIS — E669 Obesity, unspecified: Secondary | ICD-10-CM | POA: Diagnosis not present

## 2022-11-27 DIAGNOSIS — I10 Essential (primary) hypertension: Secondary | ICD-10-CM | POA: Diagnosis not present

## 2022-11-27 DIAGNOSIS — C50812 Malignant neoplasm of overlapping sites of left female breast: Secondary | ICD-10-CM | POA: Diagnosis not present

## 2022-11-27 DIAGNOSIS — C50912 Malignant neoplasm of unspecified site of left female breast: Secondary | ICD-10-CM | POA: Diagnosis not present

## 2022-11-28 DIAGNOSIS — Z170 Estrogen receptor positive status [ER+]: Secondary | ICD-10-CM | POA: Diagnosis not present

## 2022-11-28 DIAGNOSIS — Z6832 Body mass index (BMI) 32.0-32.9, adult: Secondary | ICD-10-CM | POA: Diagnosis not present

## 2022-11-28 DIAGNOSIS — C773 Secondary and unspecified malignant neoplasm of axilla and upper limb lymph nodes: Secondary | ICD-10-CM | POA: Diagnosis not present

## 2022-11-28 DIAGNOSIS — C50812 Malignant neoplasm of overlapping sites of left female breast: Secondary | ICD-10-CM | POA: Diagnosis not present

## 2022-11-28 DIAGNOSIS — L732 Hidradenitis suppurativa: Secondary | ICD-10-CM | POA: Diagnosis not present

## 2022-11-28 DIAGNOSIS — K219 Gastro-esophageal reflux disease without esophagitis: Secondary | ICD-10-CM | POA: Diagnosis not present

## 2022-11-28 DIAGNOSIS — E669 Obesity, unspecified: Secondary | ICD-10-CM | POA: Diagnosis not present

## 2022-11-28 DIAGNOSIS — I10 Essential (primary) hypertension: Secondary | ICD-10-CM | POA: Diagnosis not present

## 2022-11-28 MED ORDER — ONDANSETRON 4 MG DISINTEGRATING TABLET
ORAL_TABLET | Freq: Three times a day (TID) | ORAL | 0 refills | 7 days | Status: CP | PRN
Start: 2022-11-28 — End: ?

## 2022-11-28 MED ORDER — SULFAMETHOXAZOLE 800 MG-TRIMETHOPRIM 160 MG TABLET
ORAL_TABLET | Freq: Two times a day (BID) | ORAL | 0 refills | 10 days | Status: CP
Start: 2022-11-28 — End: 2022-12-08

## 2022-11-28 MED ORDER — TRAMADOL 50 MG TABLET
ORAL_TABLET | Freq: Four times a day (QID) | ORAL | 0 refills | 3 days | Status: CP | PRN
Start: 2022-11-28 — End: ?

## 2022-12-05 ENCOUNTER — Ambulatory Visit
Admit: 2022-12-05 | Discharge: 2022-12-06 | Payer: PRIVATE HEALTH INSURANCE | Attending: Adult Health | Primary: Adult Health

## 2022-12-05 DIAGNOSIS — C50912 Malignant neoplasm of unspecified site of left female breast: Secondary | ICD-10-CM | POA: Diagnosis not present

## 2022-12-05 DIAGNOSIS — Z09 Encounter for follow-up examination after completed treatment for conditions other than malignant neoplasm: Secondary | ICD-10-CM | POA: Diagnosis not present

## 2022-12-05 DIAGNOSIS — Z170 Estrogen receptor positive status [ER+]: Secondary | ICD-10-CM | POA: Diagnosis not present

## 2022-12-10 ENCOUNTER — Ambulatory Visit: Admit: 2022-12-10 | Discharge: 2022-12-11 | Payer: PRIVATE HEALTH INSURANCE

## 2022-12-10 DIAGNOSIS — C50912 Malignant neoplasm of unspecified site of left female breast: Secondary | ICD-10-CM | POA: Diagnosis not present

## 2022-12-10 DIAGNOSIS — Z170 Estrogen receptor positive status [ER+]: Secondary | ICD-10-CM | POA: Diagnosis not present

## 2022-12-25 ENCOUNTER — Ambulatory Visit
Admit: 2022-12-25 | Discharge: 2023-01-02 | Payer: PRIVATE HEALTH INSURANCE | Attending: Radiation Oncology | Primary: Radiation Oncology

## 2022-12-25 ENCOUNTER — Ambulatory Visit
Admit: 2022-12-25 | Discharge: 2022-12-26 | Payer: PRIVATE HEALTH INSURANCE | Attending: Adult Health | Primary: Adult Health

## 2022-12-25 DIAGNOSIS — Z09 Encounter for follow-up examination after completed treatment for conditions other than malignant neoplasm: Secondary | ICD-10-CM | POA: Diagnosis not present

## 2022-12-25 DIAGNOSIS — Z170 Estrogen receptor positive status [ER+]: Secondary | ICD-10-CM | POA: Diagnosis not present

## 2022-12-25 DIAGNOSIS — Z419 Encounter for procedure for purposes other than remedying health state, unspecified: Secondary | ICD-10-CM | POA: Diagnosis not present

## 2022-12-25 DIAGNOSIS — C50912 Malignant neoplasm of unspecified site of left female breast: Secondary | ICD-10-CM | POA: Diagnosis not present

## 2022-12-29 DIAGNOSIS — F331 Major depressive disorder, recurrent, moderate: Secondary | ICD-10-CM | POA: Diagnosis not present

## 2022-12-30 ENCOUNTER — Ambulatory Visit: Admit: 2022-12-30 | Discharge: 2022-12-31 | Payer: PRIVATE HEALTH INSURANCE

## 2022-12-30 DIAGNOSIS — C50912 Malignant neoplasm of unspecified site of left female breast: Secondary | ICD-10-CM | POA: Diagnosis not present

## 2022-12-30 DIAGNOSIS — R590 Localized enlarged lymph nodes: Secondary | ICD-10-CM | POA: Diagnosis not present

## 2022-12-30 DIAGNOSIS — Z170 Estrogen receptor positive status [ER+]: Secondary | ICD-10-CM | POA: Diagnosis not present

## 2022-12-30 DIAGNOSIS — N907 Vulvar cyst: Secondary | ICD-10-CM | POA: Diagnosis not present

## 2022-12-31 DIAGNOSIS — F331 Major depressive disorder, recurrent, moderate: Secondary | ICD-10-CM | POA: Diagnosis not present

## 2023-01-01 ENCOUNTER — Ambulatory Visit: Admit: 2022-12-25 | Payer: PRIVATE HEALTH INSURANCE

## 2023-01-01 ENCOUNTER — Ambulatory Visit
Admit: 2023-01-01 | Discharge: 2023-01-02 | Payer: PRIVATE HEALTH INSURANCE | Attending: Adult Health | Primary: Adult Health

## 2023-01-01 DIAGNOSIS — Z510 Encounter for antineoplastic radiation therapy: Secondary | ICD-10-CM | POA: Diagnosis not present

## 2023-01-01 DIAGNOSIS — Z170 Estrogen receptor positive status [ER+]: Secondary | ICD-10-CM | POA: Diagnosis not present

## 2023-01-01 DIAGNOSIS — C50812 Malignant neoplasm of overlapping sites of left female breast: Secondary | ICD-10-CM | POA: Diagnosis not present

## 2023-01-01 DIAGNOSIS — C50912 Malignant neoplasm of unspecified site of left female breast: Secondary | ICD-10-CM | POA: Diagnosis not present

## 2023-01-01 DIAGNOSIS — I890 Lymphedema, not elsewhere classified: Secondary | ICD-10-CM | POA: Diagnosis not present

## 2023-01-01 DIAGNOSIS — Z9221 Personal history of antineoplastic chemotherapy: Secondary | ICD-10-CM | POA: Diagnosis not present

## 2023-01-01 DIAGNOSIS — L732 Hidradenitis suppurativa: Secondary | ICD-10-CM | POA: Diagnosis not present

## 2023-01-01 DIAGNOSIS — Z923 Personal history of irradiation: Secondary | ICD-10-CM | POA: Diagnosis not present

## 2023-01-02 DIAGNOSIS — F331 Major depressive disorder, recurrent, moderate: Secondary | ICD-10-CM | POA: Diagnosis not present

## 2023-01-05 DIAGNOSIS — Z170 Estrogen receptor positive status [ER+]: Principal | ICD-10-CM

## 2023-01-05 DIAGNOSIS — C50912 Malignant neoplasm of unspecified site of left female breast: Principal | ICD-10-CM

## 2023-01-07 DIAGNOSIS — F331 Major depressive disorder, recurrent, moderate: Secondary | ICD-10-CM | POA: Diagnosis not present

## 2023-01-08 ENCOUNTER — Ambulatory Visit: Admit: 2023-01-08 | Discharge: 2023-01-09 | Payer: PRIVATE HEALTH INSURANCE

## 2023-01-08 DIAGNOSIS — Z5111 Encounter for antineoplastic chemotherapy: Secondary | ICD-10-CM | POA: Diagnosis not present

## 2023-01-08 DIAGNOSIS — Z170 Estrogen receptor positive status [ER+]: Secondary | ICD-10-CM | POA: Diagnosis not present

## 2023-01-08 DIAGNOSIS — C50912 Malignant neoplasm of unspecified site of left female breast: Secondary | ICD-10-CM | POA: Diagnosis not present

## 2023-01-09 DIAGNOSIS — F331 Major depressive disorder, recurrent, moderate: Secondary | ICD-10-CM | POA: Diagnosis not present

## 2023-01-12 ENCOUNTER — Encounter
Admit: 2023-01-12 | Discharge: 2023-01-22 | Payer: PRIVATE HEALTH INSURANCE | Attending: Radiation Oncology | Primary: Radiation Oncology

## 2023-01-12 ENCOUNTER — Encounter: Admit: 2023-01-12 | Payer: PRIVATE HEALTH INSURANCE | Attending: Radiation Oncology | Primary: Radiation Oncology

## 2023-01-12 ENCOUNTER — Ambulatory Visit: Admit: 2023-01-12 | Payer: PRIVATE HEALTH INSURANCE

## 2023-01-12 ENCOUNTER — Ambulatory Visit: Admit: 2023-01-21 | Discharge: 2023-01-22 | Payer: PRIVATE HEALTH INSURANCE

## 2023-01-12 DIAGNOSIS — C50812 Malignant neoplasm of overlapping sites of left female breast: Secondary | ICD-10-CM | POA: Diagnosis not present

## 2023-01-12 DIAGNOSIS — Z9221 Personal history of antineoplastic chemotherapy: Secondary | ICD-10-CM | POA: Diagnosis not present

## 2023-01-12 DIAGNOSIS — I890 Lymphedema, not elsewhere classified: Secondary | ICD-10-CM | POA: Diagnosis not present

## 2023-01-12 DIAGNOSIS — C50912 Malignant neoplasm of unspecified site of left female breast: Secondary | ICD-10-CM | POA: Diagnosis not present

## 2023-01-12 DIAGNOSIS — Z510 Encounter for antineoplastic radiation therapy: Secondary | ICD-10-CM | POA: Diagnosis not present

## 2023-01-12 DIAGNOSIS — Z170 Estrogen receptor positive status [ER+]: Secondary | ICD-10-CM | POA: Diagnosis not present

## 2023-01-12 DIAGNOSIS — L732 Hidradenitis suppurativa: Secondary | ICD-10-CM | POA: Diagnosis not present

## 2023-01-12 DIAGNOSIS — Z923 Personal history of irradiation: Secondary | ICD-10-CM | POA: Diagnosis not present

## 2023-01-14 DIAGNOSIS — C50812 Malignant neoplasm of overlapping sites of left female breast: Secondary | ICD-10-CM | POA: Diagnosis not present

## 2023-01-14 DIAGNOSIS — C50912 Malignant neoplasm of unspecified site of left female breast: Secondary | ICD-10-CM | POA: Diagnosis not present

## 2023-01-14 DIAGNOSIS — F331 Major depressive disorder, recurrent, moderate: Secondary | ICD-10-CM | POA: Diagnosis not present

## 2023-01-14 DIAGNOSIS — Z170 Estrogen receptor positive status [ER+]: Secondary | ICD-10-CM | POA: Diagnosis not present

## 2023-01-14 DIAGNOSIS — L732 Hidradenitis suppurativa: Secondary | ICD-10-CM | POA: Diagnosis not present

## 2023-01-14 DIAGNOSIS — I890 Lymphedema, not elsewhere classified: Secondary | ICD-10-CM | POA: Diagnosis not present

## 2023-01-14 DIAGNOSIS — Z510 Encounter for antineoplastic radiation therapy: Secondary | ICD-10-CM | POA: Diagnosis not present

## 2023-01-14 DIAGNOSIS — Z923 Personal history of irradiation: Secondary | ICD-10-CM | POA: Diagnosis not present

## 2023-01-14 DIAGNOSIS — Z9221 Personal history of antineoplastic chemotherapy: Secondary | ICD-10-CM | POA: Diagnosis not present

## 2023-01-15 DIAGNOSIS — C50912 Malignant neoplasm of unspecified site of left female breast: Secondary | ICD-10-CM | POA: Diagnosis not present

## 2023-01-15 DIAGNOSIS — C50812 Malignant neoplasm of overlapping sites of left female breast: Secondary | ICD-10-CM | POA: Diagnosis not present

## 2023-01-15 DIAGNOSIS — Z170 Estrogen receptor positive status [ER+]: Secondary | ICD-10-CM | POA: Diagnosis not present

## 2023-01-15 DIAGNOSIS — Z510 Encounter for antineoplastic radiation therapy: Secondary | ICD-10-CM | POA: Diagnosis not present

## 2023-01-15 DIAGNOSIS — N6489 Other specified disorders of breast: Principal | ICD-10-CM

## 2023-01-15 MED ORDER — MOMETASONE 0.1 % TOPICAL CREAM
1 refills | 0 days | Status: CP
Start: 2023-01-15 — End: 2024-01-15

## 2023-01-16 DIAGNOSIS — Z510 Encounter for antineoplastic radiation therapy: Secondary | ICD-10-CM | POA: Diagnosis not present

## 2023-01-16 DIAGNOSIS — F331 Major depressive disorder, recurrent, moderate: Secondary | ICD-10-CM | POA: Diagnosis not present

## 2023-01-16 DIAGNOSIS — C50912 Malignant neoplasm of unspecified site of left female breast: Secondary | ICD-10-CM | POA: Diagnosis not present

## 2023-01-16 DIAGNOSIS — C50812 Malignant neoplasm of overlapping sites of left female breast: Secondary | ICD-10-CM | POA: Diagnosis not present

## 2023-01-16 DIAGNOSIS — Z170 Estrogen receptor positive status [ER+]: Secondary | ICD-10-CM | POA: Diagnosis not present

## 2023-01-19 ENCOUNTER — Ambulatory Visit: Admit: 2023-01-19 | Discharge: 2023-01-20 | Payer: PRIVATE HEALTH INSURANCE

## 2023-01-19 DIAGNOSIS — Z170 Estrogen receptor positive status [ER+]: Secondary | ICD-10-CM | POA: Diagnosis not present

## 2023-01-19 DIAGNOSIS — C50812 Malignant neoplasm of overlapping sites of left female breast: Secondary | ICD-10-CM | POA: Diagnosis not present

## 2023-01-19 DIAGNOSIS — Z510 Encounter for antineoplastic radiation therapy: Secondary | ICD-10-CM | POA: Diagnosis not present

## 2023-01-19 DIAGNOSIS — C50912 Malignant neoplasm of unspecified site of left female breast: Secondary | ICD-10-CM | POA: Diagnosis not present

## 2023-01-19 DIAGNOSIS — N6489 Other specified disorders of breast: Secondary | ICD-10-CM | POA: Diagnosis not present

## 2023-01-19 DIAGNOSIS — L7634 Postprocedural seroma of skin and subcutaneous tissue following other procedure: Secondary | ICD-10-CM | POA: Diagnosis not present

## 2023-01-20 DIAGNOSIS — Z510 Encounter for antineoplastic radiation therapy: Secondary | ICD-10-CM | POA: Diagnosis not present

## 2023-01-20 DIAGNOSIS — Z170 Estrogen receptor positive status [ER+]: Secondary | ICD-10-CM | POA: Diagnosis not present

## 2023-01-20 DIAGNOSIS — C50912 Malignant neoplasm of unspecified site of left female breast: Secondary | ICD-10-CM | POA: Diagnosis not present

## 2023-01-20 DIAGNOSIS — C50812 Malignant neoplasm of overlapping sites of left female breast: Secondary | ICD-10-CM | POA: Diagnosis not present

## 2023-01-21 DIAGNOSIS — C50812 Malignant neoplasm of overlapping sites of left female breast: Secondary | ICD-10-CM | POA: Diagnosis not present

## 2023-01-21 DIAGNOSIS — F331 Major depressive disorder, recurrent, moderate: Secondary | ICD-10-CM | POA: Diagnosis not present

## 2023-01-21 DIAGNOSIS — Z510 Encounter for antineoplastic radiation therapy: Secondary | ICD-10-CM | POA: Diagnosis not present

## 2023-01-21 DIAGNOSIS — Z170 Estrogen receptor positive status [ER+]: Secondary | ICD-10-CM | POA: Diagnosis not present

## 2023-01-21 DIAGNOSIS — C50912 Malignant neoplasm of unspecified site of left female breast: Secondary | ICD-10-CM | POA: Diagnosis not present

## 2023-01-22 ENCOUNTER — Ambulatory Visit
Admit: 2023-01-22 | Payer: PRIVATE HEALTH INSURANCE | Attending: Rehabilitative and Restorative Service Providers" | Primary: Rehabilitative and Restorative Service Providers"

## 2023-01-22 DIAGNOSIS — Z9889 Other specified postprocedural states: Secondary | ICD-10-CM | POA: Diagnosis not present

## 2023-01-22 DIAGNOSIS — L7682 Other postprocedural complications of skin and subcutaneous tissue: Secondary | ICD-10-CM | POA: Diagnosis not present

## 2023-01-22 DIAGNOSIS — Z170 Estrogen receptor positive status [ER+]: Secondary | ICD-10-CM | POA: Diagnosis not present

## 2023-01-22 DIAGNOSIS — I890 Lymphedema, not elsewhere classified: Secondary | ICD-10-CM | POA: Diagnosis not present

## 2023-01-22 DIAGNOSIS — Z510 Encounter for antineoplastic radiation therapy: Secondary | ICD-10-CM | POA: Diagnosis not present

## 2023-01-22 DIAGNOSIS — L905 Scar conditions and fibrosis of skin: Secondary | ICD-10-CM | POA: Diagnosis not present

## 2023-01-22 DIAGNOSIS — C50912 Malignant neoplasm of unspecified site of left female breast: Secondary | ICD-10-CM | POA: Diagnosis not present

## 2023-01-22 DIAGNOSIS — C50812 Malignant neoplasm of overlapping sites of left female breast: Secondary | ICD-10-CM | POA: Diagnosis not present

## 2023-01-23 ENCOUNTER — Ambulatory Visit: Admit: 2023-01-23 | Payer: PRIVATE HEALTH INSURANCE

## 2023-01-23 DIAGNOSIS — Z923 Personal history of irradiation: Secondary | ICD-10-CM | POA: Diagnosis not present

## 2023-01-23 DIAGNOSIS — L732 Hidradenitis suppurativa: Secondary | ICD-10-CM | POA: Diagnosis not present

## 2023-01-23 DIAGNOSIS — F331 Major depressive disorder, recurrent, moderate: Secondary | ICD-10-CM | POA: Diagnosis not present

## 2023-01-23 DIAGNOSIS — Z9221 Personal history of antineoplastic chemotherapy: Secondary | ICD-10-CM | POA: Diagnosis not present

## 2023-01-23 DIAGNOSIS — Z170 Estrogen receptor positive status [ER+]: Secondary | ICD-10-CM | POA: Diagnosis not present

## 2023-01-23 DIAGNOSIS — C50912 Malignant neoplasm of unspecified site of left female breast: Secondary | ICD-10-CM | POA: Diagnosis not present

## 2023-01-23 DIAGNOSIS — Z510 Encounter for antineoplastic radiation therapy: Secondary | ICD-10-CM | POA: Diagnosis not present

## 2023-01-23 DIAGNOSIS — I890 Lymphedema, not elsewhere classified: Secondary | ICD-10-CM | POA: Diagnosis not present

## 2023-01-23 DIAGNOSIS — Z419 Encounter for procedure for purposes other than remedying health state, unspecified: Secondary | ICD-10-CM | POA: Diagnosis not present

## 2023-01-23 DIAGNOSIS — C50812 Malignant neoplasm of overlapping sites of left female breast: Secondary | ICD-10-CM | POA: Diagnosis not present

## 2023-01-26 DIAGNOSIS — Z170 Estrogen receptor positive status [ER+]: Secondary | ICD-10-CM | POA: Diagnosis not present

## 2023-01-26 DIAGNOSIS — Z9221 Personal history of antineoplastic chemotherapy: Secondary | ICD-10-CM | POA: Diagnosis not present

## 2023-01-26 DIAGNOSIS — C50912 Malignant neoplasm of unspecified site of left female breast: Secondary | ICD-10-CM | POA: Diagnosis not present

## 2023-01-26 DIAGNOSIS — Z510 Encounter for antineoplastic radiation therapy: Secondary | ICD-10-CM | POA: Diagnosis not present

## 2023-01-26 DIAGNOSIS — C50812 Malignant neoplasm of overlapping sites of left female breast: Secondary | ICD-10-CM | POA: Diagnosis not present

## 2023-01-26 DIAGNOSIS — Z923 Personal history of irradiation: Secondary | ICD-10-CM | POA: Diagnosis not present

## 2023-01-26 DIAGNOSIS — L732 Hidradenitis suppurativa: Secondary | ICD-10-CM | POA: Diagnosis not present

## 2023-01-26 DIAGNOSIS — I890 Lymphedema, not elsewhere classified: Secondary | ICD-10-CM | POA: Diagnosis not present

## 2023-01-27 ENCOUNTER — Encounter
Admit: 2023-01-27 | Discharge: 2023-02-22 | Payer: PRIVATE HEALTH INSURANCE | Attending: Radiation Oncology | Primary: Radiation Oncology

## 2023-01-27 ENCOUNTER — Encounter: Admit: 2023-01-27 | Payer: PRIVATE HEALTH INSURANCE | Attending: Radiation Oncology | Primary: Radiation Oncology

## 2023-01-27 ENCOUNTER — Institutional Professional Consult (permissible substitution): Admit: 2023-01-27 | Payer: PRIVATE HEALTH INSURANCE | Attending: Radiation Oncology | Primary: Radiation Oncology

## 2023-01-27 DIAGNOSIS — L988 Other specified disorders of the skin and subcutaneous tissue: Secondary | ICD-10-CM | POA: Diagnosis not present

## 2023-01-27 DIAGNOSIS — N6323 Unspecified lump in the left breast, lower outer quadrant: Secondary | ICD-10-CM | POA: Diagnosis not present

## 2023-01-27 DIAGNOSIS — N644 Mastodynia: Secondary | ICD-10-CM | POA: Diagnosis not present

## 2023-01-27 DIAGNOSIS — R238 Other skin changes: Secondary | ICD-10-CM | POA: Diagnosis not present

## 2023-01-27 DIAGNOSIS — K209 Esophagitis, unspecified without bleeding: Secondary | ICD-10-CM | POA: Diagnosis not present

## 2023-01-27 DIAGNOSIS — R1310 Dysphagia, unspecified: Secondary | ICD-10-CM | POA: Diagnosis not present

## 2023-01-27 DIAGNOSIS — Z510 Encounter for antineoplastic radiation therapy: Secondary | ICD-10-CM | POA: Diagnosis not present

## 2023-01-27 DIAGNOSIS — L905 Scar conditions and fibrosis of skin: Secondary | ICD-10-CM | POA: Diagnosis not present

## 2023-01-27 DIAGNOSIS — R208 Other disturbances of skin sensation: Secondary | ICD-10-CM | POA: Diagnosis not present

## 2023-01-27 DIAGNOSIS — I890 Lymphedema, not elsewhere classified: Secondary | ICD-10-CM | POA: Diagnosis not present

## 2023-01-27 DIAGNOSIS — R231 Pallor: Secondary | ICD-10-CM | POA: Diagnosis not present

## 2023-01-27 DIAGNOSIS — C50812 Malignant neoplasm of overlapping sites of left female breast: Secondary | ICD-10-CM | POA: Diagnosis not present

## 2023-01-27 DIAGNOSIS — Z170 Estrogen receptor positive status [ER+]: Secondary | ICD-10-CM | POA: Diagnosis not present

## 2023-01-28 DIAGNOSIS — R1310 Dysphagia, unspecified: Secondary | ICD-10-CM | POA: Diagnosis not present

## 2023-01-28 DIAGNOSIS — F331 Major depressive disorder, recurrent, moderate: Secondary | ICD-10-CM | POA: Diagnosis not present

## 2023-01-28 DIAGNOSIS — C50812 Malignant neoplasm of overlapping sites of left female breast: Secondary | ICD-10-CM | POA: Diagnosis not present

## 2023-01-28 DIAGNOSIS — N644 Mastodynia: Secondary | ICD-10-CM | POA: Diagnosis not present

## 2023-01-28 DIAGNOSIS — K209 Esophagitis, unspecified without bleeding: Secondary | ICD-10-CM | POA: Diagnosis not present

## 2023-01-28 DIAGNOSIS — R238 Other skin changes: Secondary | ICD-10-CM | POA: Diagnosis not present

## 2023-01-28 DIAGNOSIS — I890 Lymphedema, not elsewhere classified: Secondary | ICD-10-CM | POA: Diagnosis not present

## 2023-01-28 DIAGNOSIS — Z170 Estrogen receptor positive status [ER+]: Secondary | ICD-10-CM | POA: Diagnosis not present

## 2023-01-28 DIAGNOSIS — R208 Other disturbances of skin sensation: Secondary | ICD-10-CM | POA: Diagnosis not present

## 2023-01-28 DIAGNOSIS — R231 Pallor: Secondary | ICD-10-CM | POA: Diagnosis not present

## 2023-01-28 DIAGNOSIS — L988 Other specified disorders of the skin and subcutaneous tissue: Secondary | ICD-10-CM | POA: Diagnosis not present

## 2023-01-28 DIAGNOSIS — N6323 Unspecified lump in the left breast, lower outer quadrant: Secondary | ICD-10-CM | POA: Diagnosis not present

## 2023-01-28 DIAGNOSIS — Z510 Encounter for antineoplastic radiation therapy: Secondary | ICD-10-CM | POA: Diagnosis not present

## 2023-01-28 DIAGNOSIS — L905 Scar conditions and fibrosis of skin: Secondary | ICD-10-CM | POA: Diagnosis not present

## 2023-01-28 DIAGNOSIS — Z171 Estrogen receptor negative status [ER-]: Principal | ICD-10-CM

## 2023-01-28 DIAGNOSIS — C50912 Malignant neoplasm of unspecified site of left female breast: Principal | ICD-10-CM

## 2023-01-29 DIAGNOSIS — K209 Esophagitis, unspecified without bleeding: Secondary | ICD-10-CM | POA: Diagnosis not present

## 2023-01-29 DIAGNOSIS — L988 Other specified disorders of the skin and subcutaneous tissue: Secondary | ICD-10-CM | POA: Diagnosis not present

## 2023-01-29 DIAGNOSIS — L905 Scar conditions and fibrosis of skin: Secondary | ICD-10-CM | POA: Diagnosis not present

## 2023-01-29 DIAGNOSIS — R208 Other disturbances of skin sensation: Secondary | ICD-10-CM | POA: Diagnosis not present

## 2023-01-29 DIAGNOSIS — R238 Other skin changes: Secondary | ICD-10-CM | POA: Diagnosis not present

## 2023-01-29 DIAGNOSIS — Z170 Estrogen receptor positive status [ER+]: Secondary | ICD-10-CM | POA: Diagnosis not present

## 2023-01-29 DIAGNOSIS — N6323 Unspecified lump in the left breast, lower outer quadrant: Secondary | ICD-10-CM | POA: Diagnosis not present

## 2023-01-29 DIAGNOSIS — Z9889 Other specified postprocedural states: Secondary | ICD-10-CM | POA: Diagnosis not present

## 2023-01-29 DIAGNOSIS — L7682 Other postprocedural complications of skin and subcutaneous tissue: Secondary | ICD-10-CM | POA: Diagnosis not present

## 2023-01-29 DIAGNOSIS — R1310 Dysphagia, unspecified: Secondary | ICD-10-CM | POA: Diagnosis not present

## 2023-01-29 DIAGNOSIS — N644 Mastodynia: Secondary | ICD-10-CM | POA: Diagnosis not present

## 2023-01-29 DIAGNOSIS — R231 Pallor: Secondary | ICD-10-CM | POA: Diagnosis not present

## 2023-01-29 DIAGNOSIS — I890 Lymphedema, not elsewhere classified: Secondary | ICD-10-CM | POA: Diagnosis not present

## 2023-01-29 DIAGNOSIS — C50912 Malignant neoplasm of unspecified site of left female breast: Secondary | ICD-10-CM | POA: Diagnosis not present

## 2023-01-29 DIAGNOSIS — Z510 Encounter for antineoplastic radiation therapy: Secondary | ICD-10-CM | POA: Diagnosis not present

## 2023-01-29 DIAGNOSIS — C50812 Malignant neoplasm of overlapping sites of left female breast: Secondary | ICD-10-CM | POA: Diagnosis not present

## 2023-01-30 DIAGNOSIS — C50812 Malignant neoplasm of overlapping sites of left female breast: Secondary | ICD-10-CM | POA: Diagnosis not present

## 2023-01-30 DIAGNOSIS — N6323 Unspecified lump in the left breast, lower outer quadrant: Secondary | ICD-10-CM | POA: Diagnosis not present

## 2023-01-30 DIAGNOSIS — R208 Other disturbances of skin sensation: Secondary | ICD-10-CM | POA: Diagnosis not present

## 2023-01-30 DIAGNOSIS — L988 Other specified disorders of the skin and subcutaneous tissue: Secondary | ICD-10-CM | POA: Diagnosis not present

## 2023-01-30 DIAGNOSIS — N644 Mastodynia: Secondary | ICD-10-CM | POA: Diagnosis not present

## 2023-01-30 DIAGNOSIS — K209 Esophagitis, unspecified without bleeding: Secondary | ICD-10-CM | POA: Diagnosis not present

## 2023-01-30 DIAGNOSIS — Z510 Encounter for antineoplastic radiation therapy: Secondary | ICD-10-CM | POA: Diagnosis not present

## 2023-01-30 DIAGNOSIS — Z170 Estrogen receptor positive status [ER+]: Secondary | ICD-10-CM | POA: Diagnosis not present

## 2023-01-30 DIAGNOSIS — I890 Lymphedema, not elsewhere classified: Secondary | ICD-10-CM | POA: Diagnosis not present

## 2023-01-30 DIAGNOSIS — R1310 Dysphagia, unspecified: Secondary | ICD-10-CM | POA: Diagnosis not present

## 2023-01-30 DIAGNOSIS — L905 Scar conditions and fibrosis of skin: Secondary | ICD-10-CM | POA: Diagnosis not present

## 2023-01-30 DIAGNOSIS — R238 Other skin changes: Secondary | ICD-10-CM | POA: Diagnosis not present

## 2023-01-30 DIAGNOSIS — F331 Major depressive disorder, recurrent, moderate: Secondary | ICD-10-CM | POA: Diagnosis not present

## 2023-01-30 DIAGNOSIS — R231 Pallor: Secondary | ICD-10-CM | POA: Diagnosis not present

## 2023-02-02 DIAGNOSIS — L988 Other specified disorders of the skin and subcutaneous tissue: Secondary | ICD-10-CM | POA: Diagnosis not present

## 2023-02-02 DIAGNOSIS — N6323 Unspecified lump in the left breast, lower outer quadrant: Secondary | ICD-10-CM | POA: Diagnosis not present

## 2023-02-02 DIAGNOSIS — I890 Lymphedema, not elsewhere classified: Secondary | ICD-10-CM | POA: Diagnosis not present

## 2023-02-02 DIAGNOSIS — K209 Esophagitis, unspecified without bleeding: Secondary | ICD-10-CM | POA: Diagnosis not present

## 2023-02-02 DIAGNOSIS — Z170 Estrogen receptor positive status [ER+]: Secondary | ICD-10-CM | POA: Diagnosis not present

## 2023-02-02 DIAGNOSIS — Z510 Encounter for antineoplastic radiation therapy: Secondary | ICD-10-CM | POA: Diagnosis not present

## 2023-02-02 DIAGNOSIS — C50812 Malignant neoplasm of overlapping sites of left female breast: Secondary | ICD-10-CM | POA: Diagnosis not present

## 2023-02-02 DIAGNOSIS — L905 Scar conditions and fibrosis of skin: Secondary | ICD-10-CM | POA: Diagnosis not present

## 2023-02-02 DIAGNOSIS — N644 Mastodynia: Secondary | ICD-10-CM | POA: Diagnosis not present

## 2023-02-02 DIAGNOSIS — R238 Other skin changes: Secondary | ICD-10-CM | POA: Diagnosis not present

## 2023-02-02 DIAGNOSIS — R231 Pallor: Secondary | ICD-10-CM | POA: Diagnosis not present

## 2023-02-02 DIAGNOSIS — R1310 Dysphagia, unspecified: Secondary | ICD-10-CM | POA: Diagnosis not present

## 2023-02-02 DIAGNOSIS — R208 Other disturbances of skin sensation: Secondary | ICD-10-CM | POA: Diagnosis not present

## 2023-02-02 DIAGNOSIS — K219 Gastro-esophageal reflux disease without esophagitis: Principal | ICD-10-CM

## 2023-02-02 DIAGNOSIS — K644 Residual hemorrhoidal skin tags: Principal | ICD-10-CM

## 2023-02-03 DIAGNOSIS — R231 Pallor: Secondary | ICD-10-CM | POA: Diagnosis not present

## 2023-02-03 DIAGNOSIS — R238 Other skin changes: Secondary | ICD-10-CM | POA: Diagnosis not present

## 2023-02-03 DIAGNOSIS — K209 Esophagitis, unspecified without bleeding: Secondary | ICD-10-CM | POA: Diagnosis not present

## 2023-02-03 DIAGNOSIS — L988 Other specified disorders of the skin and subcutaneous tissue: Secondary | ICD-10-CM | POA: Diagnosis not present

## 2023-02-03 DIAGNOSIS — Z170 Estrogen receptor positive status [ER+]: Secondary | ICD-10-CM | POA: Diagnosis not present

## 2023-02-03 DIAGNOSIS — Z510 Encounter for antineoplastic radiation therapy: Secondary | ICD-10-CM | POA: Diagnosis not present

## 2023-02-03 DIAGNOSIS — N644 Mastodynia: Secondary | ICD-10-CM | POA: Diagnosis not present

## 2023-02-03 DIAGNOSIS — C50812 Malignant neoplasm of overlapping sites of left female breast: Secondary | ICD-10-CM | POA: Diagnosis not present

## 2023-02-03 DIAGNOSIS — I890 Lymphedema, not elsewhere classified: Secondary | ICD-10-CM | POA: Diagnosis not present

## 2023-02-03 DIAGNOSIS — R208 Other disturbances of skin sensation: Secondary | ICD-10-CM | POA: Diagnosis not present

## 2023-02-03 DIAGNOSIS — L905 Scar conditions and fibrosis of skin: Secondary | ICD-10-CM | POA: Diagnosis not present

## 2023-02-03 DIAGNOSIS — N6323 Unspecified lump in the left breast, lower outer quadrant: Secondary | ICD-10-CM | POA: Diagnosis not present

## 2023-02-03 DIAGNOSIS — R1310 Dysphagia, unspecified: Secondary | ICD-10-CM | POA: Diagnosis not present

## 2023-02-03 MED ORDER — OMEPRAZOLE 40 MG CAPSULE,DELAYED RELEASE
ORAL_CAPSULE | Freq: Every day | ORAL | 3 refills | 30 days | Status: CP
Start: 2023-02-03 — End: 2024-02-03

## 2023-02-03 MED ORDER — ALBUTEROL SULFATE HFA 90 MCG/ACTUATION AEROSOL INHALER
Freq: Four times a day (QID) | RESPIRATORY_TRACT | 2 refills | 0 days | Status: CP | PRN
Start: 2023-02-03 — End: ?

## 2023-02-03 MED ORDER — HYDROCORTISONE 2.5 % TOPICAL CREAM WITH PERINEAL APPLICATOR
Freq: Two times a day (BID) | RECTAL | 5 refills | 0 days | Status: CP
Start: 2023-02-03 — End: ?

## 2023-02-04 DIAGNOSIS — R231 Pallor: Secondary | ICD-10-CM | POA: Diagnosis not present

## 2023-02-04 DIAGNOSIS — Z510 Encounter for antineoplastic radiation therapy: Secondary | ICD-10-CM | POA: Diagnosis not present

## 2023-02-04 DIAGNOSIS — R1310 Dysphagia, unspecified: Secondary | ICD-10-CM | POA: Diagnosis not present

## 2023-02-04 DIAGNOSIS — R238 Other skin changes: Secondary | ICD-10-CM | POA: Diagnosis not present

## 2023-02-04 DIAGNOSIS — I890 Lymphedema, not elsewhere classified: Secondary | ICD-10-CM | POA: Diagnosis not present

## 2023-02-04 DIAGNOSIS — C50812 Malignant neoplasm of overlapping sites of left female breast: Secondary | ICD-10-CM | POA: Diagnosis not present

## 2023-02-04 DIAGNOSIS — L905 Scar conditions and fibrosis of skin: Secondary | ICD-10-CM | POA: Diagnosis not present

## 2023-02-04 DIAGNOSIS — Z170 Estrogen receptor positive status [ER+]: Secondary | ICD-10-CM | POA: Diagnosis not present

## 2023-02-04 DIAGNOSIS — N6323 Unspecified lump in the left breast, lower outer quadrant: Secondary | ICD-10-CM | POA: Diagnosis not present

## 2023-02-04 DIAGNOSIS — N644 Mastodynia: Secondary | ICD-10-CM | POA: Diagnosis not present

## 2023-02-04 DIAGNOSIS — K209 Esophagitis, unspecified without bleeding: Secondary | ICD-10-CM | POA: Diagnosis not present

## 2023-02-04 DIAGNOSIS — R208 Other disturbances of skin sensation: Secondary | ICD-10-CM | POA: Diagnosis not present

## 2023-02-04 DIAGNOSIS — F411 Generalized anxiety disorder: Secondary | ICD-10-CM | POA: Diagnosis not present

## 2023-02-04 DIAGNOSIS — L988 Other specified disorders of the skin and subcutaneous tissue: Secondary | ICD-10-CM | POA: Diagnosis not present

## 2023-02-04 DIAGNOSIS — C50912 Malignant neoplasm of unspecified site of left female breast: Principal | ICD-10-CM

## 2023-02-05 ENCOUNTER — Ambulatory Visit: Admit: 2023-02-05 | Discharge: 2023-02-06 | Payer: PRIVATE HEALTH INSURANCE

## 2023-02-05 DIAGNOSIS — L905 Scar conditions and fibrosis of skin: Secondary | ICD-10-CM | POA: Diagnosis not present

## 2023-02-05 DIAGNOSIS — Z923 Personal history of irradiation: Secondary | ICD-10-CM | POA: Diagnosis not present

## 2023-02-05 DIAGNOSIS — R208 Other disturbances of skin sensation: Secondary | ICD-10-CM | POA: Diagnosis not present

## 2023-02-05 DIAGNOSIS — Z170 Estrogen receptor positive status [ER+]: Secondary | ICD-10-CM | POA: Diagnosis not present

## 2023-02-05 DIAGNOSIS — R1310 Dysphagia, unspecified: Secondary | ICD-10-CM | POA: Diagnosis not present

## 2023-02-05 DIAGNOSIS — L732 Hidradenitis suppurativa: Secondary | ICD-10-CM | POA: Diagnosis not present

## 2023-02-05 DIAGNOSIS — N6323 Unspecified lump in the left breast, lower outer quadrant: Secondary | ICD-10-CM | POA: Diagnosis not present

## 2023-02-05 DIAGNOSIS — C50812 Malignant neoplasm of overlapping sites of left female breast: Secondary | ICD-10-CM | POA: Diagnosis not present

## 2023-02-05 DIAGNOSIS — R238 Other skin changes: Secondary | ICD-10-CM | POA: Diagnosis not present

## 2023-02-05 DIAGNOSIS — K209 Esophagitis, unspecified without bleeding: Secondary | ICD-10-CM | POA: Diagnosis not present

## 2023-02-05 DIAGNOSIS — Z9221 Personal history of antineoplastic chemotherapy: Secondary | ICD-10-CM | POA: Diagnosis not present

## 2023-02-05 DIAGNOSIS — I890 Lymphedema, not elsewhere classified: Secondary | ICD-10-CM | POA: Diagnosis not present

## 2023-02-05 DIAGNOSIS — N644 Mastodynia: Secondary | ICD-10-CM | POA: Diagnosis not present

## 2023-02-05 DIAGNOSIS — R231 Pallor: Secondary | ICD-10-CM | POA: Diagnosis not present

## 2023-02-05 DIAGNOSIS — L988 Other specified disorders of the skin and subcutaneous tissue: Secondary | ICD-10-CM | POA: Diagnosis not present

## 2023-02-05 DIAGNOSIS — C50912 Malignant neoplasm of unspecified site of left female breast: Secondary | ICD-10-CM | POA: Diagnosis not present

## 2023-02-05 DIAGNOSIS — Z510 Encounter for antineoplastic radiation therapy: Secondary | ICD-10-CM | POA: Diagnosis not present

## 2023-02-05 DIAGNOSIS — Z171 Estrogen receptor negative status [ER-]: Principal | ICD-10-CM

## 2023-02-05 DIAGNOSIS — R2232 Localized swelling, mass and lump, left upper limb: Principal | ICD-10-CM

## 2023-02-06 DIAGNOSIS — L905 Scar conditions and fibrosis of skin: Secondary | ICD-10-CM | POA: Diagnosis not present

## 2023-02-06 DIAGNOSIS — K209 Esophagitis, unspecified without bleeding: Secondary | ICD-10-CM | POA: Diagnosis not present

## 2023-02-06 DIAGNOSIS — R1310 Dysphagia, unspecified: Secondary | ICD-10-CM | POA: Diagnosis not present

## 2023-02-06 DIAGNOSIS — I890 Lymphedema, not elsewhere classified: Secondary | ICD-10-CM | POA: Diagnosis not present

## 2023-02-06 DIAGNOSIS — N644 Mastodynia: Secondary | ICD-10-CM | POA: Diagnosis not present

## 2023-02-06 DIAGNOSIS — L988 Other specified disorders of the skin and subcutaneous tissue: Secondary | ICD-10-CM | POA: Diagnosis not present

## 2023-02-06 DIAGNOSIS — R238 Other skin changes: Secondary | ICD-10-CM | POA: Diagnosis not present

## 2023-02-06 DIAGNOSIS — R208 Other disturbances of skin sensation: Secondary | ICD-10-CM | POA: Diagnosis not present

## 2023-02-06 DIAGNOSIS — C50812 Malignant neoplasm of overlapping sites of left female breast: Secondary | ICD-10-CM | POA: Diagnosis not present

## 2023-02-06 DIAGNOSIS — R231 Pallor: Secondary | ICD-10-CM | POA: Diagnosis not present

## 2023-02-06 DIAGNOSIS — N6323 Unspecified lump in the left breast, lower outer quadrant: Secondary | ICD-10-CM | POA: Diagnosis not present

## 2023-02-06 DIAGNOSIS — Z510 Encounter for antineoplastic radiation therapy: Secondary | ICD-10-CM | POA: Diagnosis not present

## 2023-02-06 DIAGNOSIS — Z170 Estrogen receptor positive status [ER+]: Secondary | ICD-10-CM | POA: Diagnosis not present

## 2023-02-09 DIAGNOSIS — L988 Other specified disorders of the skin and subcutaneous tissue: Secondary | ICD-10-CM | POA: Diagnosis not present

## 2023-02-09 DIAGNOSIS — Z170 Estrogen receptor positive status [ER+]: Secondary | ICD-10-CM | POA: Diagnosis not present

## 2023-02-09 DIAGNOSIS — R231 Pallor: Secondary | ICD-10-CM | POA: Diagnosis not present

## 2023-02-09 DIAGNOSIS — R1310 Dysphagia, unspecified: Secondary | ICD-10-CM | POA: Diagnosis not present

## 2023-02-09 DIAGNOSIS — N644 Mastodynia: Secondary | ICD-10-CM | POA: Diagnosis not present

## 2023-02-09 DIAGNOSIS — L905 Scar conditions and fibrosis of skin: Secondary | ICD-10-CM | POA: Diagnosis not present

## 2023-02-09 DIAGNOSIS — Z510 Encounter for antineoplastic radiation therapy: Secondary | ICD-10-CM | POA: Diagnosis not present

## 2023-02-09 DIAGNOSIS — I890 Lymphedema, not elsewhere classified: Secondary | ICD-10-CM | POA: Diagnosis not present

## 2023-02-09 DIAGNOSIS — R208 Other disturbances of skin sensation: Secondary | ICD-10-CM | POA: Diagnosis not present

## 2023-02-09 DIAGNOSIS — R238 Other skin changes: Secondary | ICD-10-CM | POA: Diagnosis not present

## 2023-02-09 DIAGNOSIS — N6323 Unspecified lump in the left breast, lower outer quadrant: Secondary | ICD-10-CM | POA: Diagnosis not present

## 2023-02-09 DIAGNOSIS — C50812 Malignant neoplasm of overlapping sites of left female breast: Secondary | ICD-10-CM | POA: Diagnosis not present

## 2023-02-09 DIAGNOSIS — K209 Esophagitis, unspecified without bleeding: Secondary | ICD-10-CM | POA: Diagnosis not present

## 2023-02-10 DIAGNOSIS — Z510 Encounter for antineoplastic radiation therapy: Secondary | ICD-10-CM | POA: Diagnosis not present

## 2023-02-10 DIAGNOSIS — I890 Lymphedema, not elsewhere classified: Secondary | ICD-10-CM | POA: Diagnosis not present

## 2023-02-10 DIAGNOSIS — Z170 Estrogen receptor positive status [ER+]: Secondary | ICD-10-CM | POA: Diagnosis not present

## 2023-02-10 DIAGNOSIS — N6323 Unspecified lump in the left breast, lower outer quadrant: Secondary | ICD-10-CM | POA: Diagnosis not present

## 2023-02-10 DIAGNOSIS — L905 Scar conditions and fibrosis of skin: Secondary | ICD-10-CM | POA: Diagnosis not present

## 2023-02-10 DIAGNOSIS — K209 Esophagitis, unspecified without bleeding: Secondary | ICD-10-CM | POA: Diagnosis not present

## 2023-02-10 DIAGNOSIS — N644 Mastodynia: Secondary | ICD-10-CM | POA: Diagnosis not present

## 2023-02-10 DIAGNOSIS — R231 Pallor: Secondary | ICD-10-CM | POA: Diagnosis not present

## 2023-02-10 DIAGNOSIS — R208 Other disturbances of skin sensation: Secondary | ICD-10-CM | POA: Diagnosis not present

## 2023-02-10 DIAGNOSIS — R1310 Dysphagia, unspecified: Secondary | ICD-10-CM | POA: Diagnosis not present

## 2023-02-10 DIAGNOSIS — L988 Other specified disorders of the skin and subcutaneous tissue: Secondary | ICD-10-CM | POA: Diagnosis not present

## 2023-02-10 DIAGNOSIS — R238 Other skin changes: Secondary | ICD-10-CM | POA: Diagnosis not present

## 2023-02-10 DIAGNOSIS — C50812 Malignant neoplasm of overlapping sites of left female breast: Secondary | ICD-10-CM | POA: Diagnosis not present

## 2023-02-11 DIAGNOSIS — L905 Scar conditions and fibrosis of skin: Secondary | ICD-10-CM | POA: Diagnosis not present

## 2023-02-11 DIAGNOSIS — R231 Pallor: Secondary | ICD-10-CM | POA: Diagnosis not present

## 2023-02-11 DIAGNOSIS — R1310 Dysphagia, unspecified: Secondary | ICD-10-CM | POA: Diagnosis not present

## 2023-02-11 DIAGNOSIS — R208 Other disturbances of skin sensation: Secondary | ICD-10-CM | POA: Diagnosis not present

## 2023-02-11 DIAGNOSIS — I890 Lymphedema, not elsewhere classified: Secondary | ICD-10-CM | POA: Diagnosis not present

## 2023-02-11 DIAGNOSIS — L988 Other specified disorders of the skin and subcutaneous tissue: Secondary | ICD-10-CM | POA: Diagnosis not present

## 2023-02-11 DIAGNOSIS — Z510 Encounter for antineoplastic radiation therapy: Secondary | ICD-10-CM | POA: Diagnosis not present

## 2023-02-11 DIAGNOSIS — C50812 Malignant neoplasm of overlapping sites of left female breast: Secondary | ICD-10-CM | POA: Diagnosis not present

## 2023-02-11 DIAGNOSIS — F411 Generalized anxiety disorder: Secondary | ICD-10-CM | POA: Diagnosis not present

## 2023-02-11 DIAGNOSIS — R238 Other skin changes: Secondary | ICD-10-CM | POA: Diagnosis not present

## 2023-02-11 DIAGNOSIS — N644 Mastodynia: Secondary | ICD-10-CM | POA: Diagnosis not present

## 2023-02-11 DIAGNOSIS — K209 Esophagitis, unspecified without bleeding: Secondary | ICD-10-CM | POA: Diagnosis not present

## 2023-02-11 DIAGNOSIS — N6323 Unspecified lump in the left breast, lower outer quadrant: Secondary | ICD-10-CM | POA: Diagnosis not present

## 2023-02-11 DIAGNOSIS — Z170 Estrogen receptor positive status [ER+]: Secondary | ICD-10-CM | POA: Diagnosis not present

## 2023-02-11 DIAGNOSIS — C50912 Malignant neoplasm of unspecified site of left female breast: Principal | ICD-10-CM

## 2023-02-12 DIAGNOSIS — R1310 Dysphagia, unspecified: Secondary | ICD-10-CM | POA: Diagnosis not present

## 2023-02-12 DIAGNOSIS — L988 Other specified disorders of the skin and subcutaneous tissue: Secondary | ICD-10-CM | POA: Diagnosis not present

## 2023-02-12 DIAGNOSIS — Z510 Encounter for antineoplastic radiation therapy: Secondary | ICD-10-CM | POA: Diagnosis not present

## 2023-02-12 DIAGNOSIS — I890 Lymphedema, not elsewhere classified: Secondary | ICD-10-CM | POA: Diagnosis not present

## 2023-02-12 DIAGNOSIS — Z170 Estrogen receptor positive status [ER+]: Secondary | ICD-10-CM | POA: Diagnosis not present

## 2023-02-12 DIAGNOSIS — N644 Mastodynia: Secondary | ICD-10-CM | POA: Diagnosis not present

## 2023-02-12 DIAGNOSIS — R208 Other disturbances of skin sensation: Secondary | ICD-10-CM | POA: Diagnosis not present

## 2023-02-12 DIAGNOSIS — C50812 Malignant neoplasm of overlapping sites of left female breast: Secondary | ICD-10-CM | POA: Diagnosis not present

## 2023-02-12 DIAGNOSIS — N6323 Unspecified lump in the left breast, lower outer quadrant: Secondary | ICD-10-CM | POA: Diagnosis not present

## 2023-02-12 DIAGNOSIS — R231 Pallor: Secondary | ICD-10-CM | POA: Diagnosis not present

## 2023-02-12 DIAGNOSIS — L905 Scar conditions and fibrosis of skin: Secondary | ICD-10-CM | POA: Diagnosis not present

## 2023-02-12 DIAGNOSIS — K209 Esophagitis, unspecified without bleeding: Secondary | ICD-10-CM | POA: Diagnosis not present

## 2023-02-12 DIAGNOSIS — R238 Other skin changes: Secondary | ICD-10-CM | POA: Diagnosis not present

## 2023-02-13 DIAGNOSIS — Z170 Estrogen receptor positive status [ER+]: Secondary | ICD-10-CM | POA: Diagnosis not present

## 2023-02-13 DIAGNOSIS — R231 Pallor: Secondary | ICD-10-CM | POA: Diagnosis not present

## 2023-02-13 DIAGNOSIS — K209 Esophagitis, unspecified without bleeding: Secondary | ICD-10-CM | POA: Diagnosis not present

## 2023-02-13 DIAGNOSIS — I890 Lymphedema, not elsewhere classified: Secondary | ICD-10-CM | POA: Diagnosis not present

## 2023-02-13 DIAGNOSIS — N6323 Unspecified lump in the left breast, lower outer quadrant: Secondary | ICD-10-CM | POA: Diagnosis not present

## 2023-02-13 DIAGNOSIS — R1310 Dysphagia, unspecified: Secondary | ICD-10-CM | POA: Diagnosis not present

## 2023-02-13 DIAGNOSIS — L905 Scar conditions and fibrosis of skin: Secondary | ICD-10-CM | POA: Diagnosis not present

## 2023-02-13 DIAGNOSIS — C50812 Malignant neoplasm of overlapping sites of left female breast: Secondary | ICD-10-CM | POA: Diagnosis not present

## 2023-02-13 DIAGNOSIS — Z510 Encounter for antineoplastic radiation therapy: Secondary | ICD-10-CM | POA: Diagnosis not present

## 2023-02-13 DIAGNOSIS — R208 Other disturbances of skin sensation: Secondary | ICD-10-CM | POA: Diagnosis not present

## 2023-02-13 DIAGNOSIS — L988 Other specified disorders of the skin and subcutaneous tissue: Secondary | ICD-10-CM | POA: Diagnosis not present

## 2023-02-13 DIAGNOSIS — R238 Other skin changes: Secondary | ICD-10-CM | POA: Diagnosis not present

## 2023-02-13 DIAGNOSIS — N644 Mastodynia: Secondary | ICD-10-CM | POA: Diagnosis not present

## 2023-02-16 DIAGNOSIS — N644 Mastodynia: Secondary | ICD-10-CM | POA: Diagnosis not present

## 2023-02-16 DIAGNOSIS — R231 Pallor: Secondary | ICD-10-CM | POA: Diagnosis not present

## 2023-02-16 DIAGNOSIS — Z170 Estrogen receptor positive status [ER+]: Secondary | ICD-10-CM | POA: Diagnosis not present

## 2023-02-16 DIAGNOSIS — K209 Esophagitis, unspecified without bleeding: Secondary | ICD-10-CM | POA: Diagnosis not present

## 2023-02-16 DIAGNOSIS — L988 Other specified disorders of the skin and subcutaneous tissue: Secondary | ICD-10-CM | POA: Diagnosis not present

## 2023-02-16 DIAGNOSIS — C50812 Malignant neoplasm of overlapping sites of left female breast: Secondary | ICD-10-CM | POA: Diagnosis not present

## 2023-02-16 DIAGNOSIS — R1310 Dysphagia, unspecified: Secondary | ICD-10-CM | POA: Diagnosis not present

## 2023-02-16 DIAGNOSIS — N6323 Unspecified lump in the left breast, lower outer quadrant: Secondary | ICD-10-CM | POA: Diagnosis not present

## 2023-02-16 DIAGNOSIS — I890 Lymphedema, not elsewhere classified: Secondary | ICD-10-CM | POA: Diagnosis not present

## 2023-02-16 DIAGNOSIS — R208 Other disturbances of skin sensation: Secondary | ICD-10-CM | POA: Diagnosis not present

## 2023-02-16 DIAGNOSIS — Z510 Encounter for antineoplastic radiation therapy: Secondary | ICD-10-CM | POA: Diagnosis not present

## 2023-02-16 DIAGNOSIS — R238 Other skin changes: Secondary | ICD-10-CM | POA: Diagnosis not present

## 2023-02-16 DIAGNOSIS — L905 Scar conditions and fibrosis of skin: Secondary | ICD-10-CM | POA: Diagnosis not present

## 2023-02-17 DIAGNOSIS — R231 Pallor: Secondary | ICD-10-CM | POA: Diagnosis not present

## 2023-02-17 DIAGNOSIS — N6323 Unspecified lump in the left breast, lower outer quadrant: Secondary | ICD-10-CM | POA: Diagnosis not present

## 2023-02-17 DIAGNOSIS — R238 Other skin changes: Secondary | ICD-10-CM | POA: Diagnosis not present

## 2023-02-17 DIAGNOSIS — R1310 Dysphagia, unspecified: Secondary | ICD-10-CM | POA: Diagnosis not present

## 2023-02-17 DIAGNOSIS — K209 Esophagitis, unspecified without bleeding: Secondary | ICD-10-CM | POA: Diagnosis not present

## 2023-02-17 DIAGNOSIS — Z170 Estrogen receptor positive status [ER+]: Secondary | ICD-10-CM | POA: Diagnosis not present

## 2023-02-17 DIAGNOSIS — Z510 Encounter for antineoplastic radiation therapy: Secondary | ICD-10-CM | POA: Diagnosis not present

## 2023-02-17 DIAGNOSIS — N644 Mastodynia: Secondary | ICD-10-CM | POA: Diagnosis not present

## 2023-02-17 DIAGNOSIS — L905 Scar conditions and fibrosis of skin: Secondary | ICD-10-CM | POA: Diagnosis not present

## 2023-02-17 DIAGNOSIS — L988 Other specified disorders of the skin and subcutaneous tissue: Secondary | ICD-10-CM | POA: Diagnosis not present

## 2023-02-17 DIAGNOSIS — R208 Other disturbances of skin sensation: Secondary | ICD-10-CM | POA: Diagnosis not present

## 2023-02-17 DIAGNOSIS — C50812 Malignant neoplasm of overlapping sites of left female breast: Secondary | ICD-10-CM | POA: Diagnosis not present

## 2023-02-17 DIAGNOSIS — I890 Lymphedema, not elsewhere classified: Secondary | ICD-10-CM | POA: Diagnosis not present

## 2023-02-18 ENCOUNTER — Ambulatory Visit: Admit: 2023-02-18 | Discharge: 2023-02-19 | Payer: PRIVATE HEALTH INSURANCE

## 2023-02-18 ENCOUNTER — Institutional Professional Consult (permissible substitution): Admit: 2023-02-18 | Discharge: 2023-02-19 | Payer: PRIVATE HEALTH INSURANCE

## 2023-02-18 DIAGNOSIS — L988 Other specified disorders of the skin and subcutaneous tissue: Secondary | ICD-10-CM | POA: Diagnosis not present

## 2023-02-18 DIAGNOSIS — C50812 Malignant neoplasm of overlapping sites of left female breast: Secondary | ICD-10-CM | POA: Diagnosis not present

## 2023-02-18 DIAGNOSIS — R238 Other skin changes: Secondary | ICD-10-CM | POA: Diagnosis not present

## 2023-02-18 DIAGNOSIS — I890 Lymphedema, not elsewhere classified: Secondary | ICD-10-CM | POA: Diagnosis not present

## 2023-02-18 DIAGNOSIS — N644 Mastodynia: Secondary | ICD-10-CM | POA: Diagnosis not present

## 2023-02-18 DIAGNOSIS — R1310 Dysphagia, unspecified: Secondary | ICD-10-CM | POA: Diagnosis not present

## 2023-02-18 DIAGNOSIS — F411 Generalized anxiety disorder: Secondary | ICD-10-CM | POA: Diagnosis not present

## 2023-02-18 DIAGNOSIS — L905 Scar conditions and fibrosis of skin: Secondary | ICD-10-CM | POA: Diagnosis not present

## 2023-02-18 DIAGNOSIS — R231 Pallor: Secondary | ICD-10-CM | POA: Diagnosis not present

## 2023-02-18 DIAGNOSIS — R208 Other disturbances of skin sensation: Secondary | ICD-10-CM | POA: Diagnosis not present

## 2023-02-18 DIAGNOSIS — K209 Esophagitis, unspecified without bleeding: Secondary | ICD-10-CM | POA: Diagnosis not present

## 2023-02-18 DIAGNOSIS — Z170 Estrogen receptor positive status [ER+]: Secondary | ICD-10-CM | POA: Diagnosis not present

## 2023-02-18 DIAGNOSIS — N6323 Unspecified lump in the left breast, lower outer quadrant: Secondary | ICD-10-CM | POA: Diagnosis not present

## 2023-02-18 DIAGNOSIS — Z510 Encounter for antineoplastic radiation therapy: Secondary | ICD-10-CM | POA: Diagnosis not present

## 2023-02-18 DIAGNOSIS — C50912 Malignant neoplasm of unspecified site of left female breast: Secondary | ICD-10-CM | POA: Diagnosis not present

## 2023-02-18 DIAGNOSIS — Z79899 Other long term (current) drug therapy: Principal | ICD-10-CM

## 2023-02-18 DIAGNOSIS — Z5181 Encounter for therapeutic drug level monitoring: Principal | ICD-10-CM

## 2023-02-18 MED ORDER — TRAMADOL 50 MG TABLET
ORAL_TABLET | Freq: Four times a day (QID) | ORAL | 0 refills | 8 days | Status: CP | PRN
Start: 2023-02-18 — End: 2024-02-18

## 2023-02-19 DIAGNOSIS — R208 Other disturbances of skin sensation: Secondary | ICD-10-CM | POA: Diagnosis not present

## 2023-02-19 DIAGNOSIS — N644 Mastodynia: Secondary | ICD-10-CM | POA: Diagnosis not present

## 2023-02-19 DIAGNOSIS — N6323 Unspecified lump in the left breast, lower outer quadrant: Secondary | ICD-10-CM | POA: Diagnosis not present

## 2023-02-19 DIAGNOSIS — I890 Lymphedema, not elsewhere classified: Secondary | ICD-10-CM | POA: Diagnosis not present

## 2023-02-19 DIAGNOSIS — L905 Scar conditions and fibrosis of skin: Secondary | ICD-10-CM | POA: Diagnosis not present

## 2023-02-19 DIAGNOSIS — L988 Other specified disorders of the skin and subcutaneous tissue: Secondary | ICD-10-CM | POA: Diagnosis not present

## 2023-02-19 DIAGNOSIS — C50812 Malignant neoplasm of overlapping sites of left female breast: Secondary | ICD-10-CM | POA: Diagnosis not present

## 2023-02-19 DIAGNOSIS — Z170 Estrogen receptor positive status [ER+]: Secondary | ICD-10-CM | POA: Diagnosis not present

## 2023-02-19 DIAGNOSIS — Z510 Encounter for antineoplastic radiation therapy: Secondary | ICD-10-CM | POA: Diagnosis not present

## 2023-02-19 DIAGNOSIS — K209 Esophagitis, unspecified without bleeding: Secondary | ICD-10-CM | POA: Diagnosis not present

## 2023-02-19 DIAGNOSIS — R231 Pallor: Secondary | ICD-10-CM | POA: Diagnosis not present

## 2023-02-19 DIAGNOSIS — R1310 Dysphagia, unspecified: Secondary | ICD-10-CM | POA: Diagnosis not present

## 2023-02-19 DIAGNOSIS — R238 Other skin changes: Secondary | ICD-10-CM | POA: Diagnosis not present

## 2023-02-20 DIAGNOSIS — Z510 Encounter for antineoplastic radiation therapy: Secondary | ICD-10-CM | POA: Diagnosis not present

## 2023-02-20 DIAGNOSIS — N6323 Unspecified lump in the left breast, lower outer quadrant: Secondary | ICD-10-CM | POA: Diagnosis not present

## 2023-02-20 DIAGNOSIS — Z170 Estrogen receptor positive status [ER+]: Secondary | ICD-10-CM | POA: Diagnosis not present

## 2023-02-20 DIAGNOSIS — L905 Scar conditions and fibrosis of skin: Secondary | ICD-10-CM | POA: Diagnosis not present

## 2023-02-20 DIAGNOSIS — R208 Other disturbances of skin sensation: Secondary | ICD-10-CM | POA: Diagnosis not present

## 2023-02-20 DIAGNOSIS — N644 Mastodynia: Secondary | ICD-10-CM | POA: Diagnosis not present

## 2023-02-20 DIAGNOSIS — C50812 Malignant neoplasm of overlapping sites of left female breast: Secondary | ICD-10-CM | POA: Diagnosis not present

## 2023-02-20 DIAGNOSIS — F411 Generalized anxiety disorder: Secondary | ICD-10-CM | POA: Diagnosis not present

## 2023-02-20 DIAGNOSIS — L988 Other specified disorders of the skin and subcutaneous tissue: Secondary | ICD-10-CM | POA: Diagnosis not present

## 2023-02-20 DIAGNOSIS — R231 Pallor: Secondary | ICD-10-CM | POA: Diagnosis not present

## 2023-02-20 DIAGNOSIS — K209 Esophagitis, unspecified without bleeding: Secondary | ICD-10-CM | POA: Diagnosis not present

## 2023-02-20 DIAGNOSIS — I890 Lymphedema, not elsewhere classified: Secondary | ICD-10-CM | POA: Diagnosis not present

## 2023-02-20 DIAGNOSIS — R238 Other skin changes: Secondary | ICD-10-CM | POA: Diagnosis not present

## 2023-02-20 DIAGNOSIS — R1310 Dysphagia, unspecified: Secondary | ICD-10-CM | POA: Diagnosis not present

## 2023-02-23 ENCOUNTER — Institutional Professional Consult (permissible substitution): Admit: 2023-02-23 | Payer: PRIVATE HEALTH INSURANCE | Attending: Radiation Oncology | Primary: Radiation Oncology

## 2023-02-23 ENCOUNTER — Ambulatory Visit: Admit: 2023-02-23 | Payer: PRIVATE HEALTH INSURANCE

## 2023-02-23 ENCOUNTER — Ambulatory Visit: Admit: 2023-02-23 | Payer: PRIVATE HEALTH INSURANCE | Attending: Radiation Oncology | Primary: Radiation Oncology

## 2023-02-23 ENCOUNTER — Encounter: Admit: 2023-02-23 | Payer: PRIVATE HEALTH INSURANCE | Attending: Radiation Oncology | Primary: Radiation Oncology

## 2023-02-23 DIAGNOSIS — R238 Other skin changes: Secondary | ICD-10-CM | POA: Diagnosis not present

## 2023-02-23 DIAGNOSIS — C50912 Malignant neoplasm of unspecified site of left female breast: Secondary | ICD-10-CM | POA: Diagnosis not present

## 2023-02-23 DIAGNOSIS — Z419 Encounter for procedure for purposes other than remedying health state, unspecified: Secondary | ICD-10-CM | POA: Diagnosis not present

## 2023-02-23 DIAGNOSIS — C50812 Malignant neoplasm of overlapping sites of left female breast: Secondary | ICD-10-CM | POA: Diagnosis not present

## 2023-02-23 DIAGNOSIS — Z5111 Encounter for antineoplastic chemotherapy: Secondary | ICD-10-CM | POA: Diagnosis not present

## 2023-02-23 DIAGNOSIS — Z170 Estrogen receptor positive status [ER+]: Secondary | ICD-10-CM | POA: Diagnosis not present

## 2023-02-23 DIAGNOSIS — Z510 Encounter for antineoplastic radiation therapy: Principal | ICD-10-CM

## 2023-02-23 MED ORDER — OXYCODONE 5 MG TABLET
ORAL_TABLET | Freq: Four times a day (QID) | ORAL | 0 refills | 5 days | Status: CP | PRN
Start: 2023-02-23 — End: 2023-03-25

## 2023-02-23 MED ORDER — SILVER SULFADIAZINE 1 % TOPICAL CREAM
1 refills | 0 days | Status: CP
Start: 2023-02-23 — End: 2024-02-23

## 2023-02-24 DIAGNOSIS — C50812 Malignant neoplasm of overlapping sites of left female breast: Secondary | ICD-10-CM | POA: Diagnosis not present

## 2023-02-24 DIAGNOSIS — C50912 Malignant neoplasm of unspecified site of left female breast: Secondary | ICD-10-CM | POA: Diagnosis not present

## 2023-02-24 DIAGNOSIS — R238 Other skin changes: Secondary | ICD-10-CM | POA: Diagnosis not present

## 2023-02-24 DIAGNOSIS — Z5111 Encounter for antineoplastic chemotherapy: Secondary | ICD-10-CM | POA: Diagnosis not present

## 2023-02-24 DIAGNOSIS — Z170 Estrogen receptor positive status [ER+]: Secondary | ICD-10-CM | POA: Diagnosis not present

## 2023-02-25 ENCOUNTER — Institutional Professional Consult (permissible substitution): Admit: 2023-02-25 | Discharge: 2023-02-25 | Payer: PRIVATE HEALTH INSURANCE

## 2023-02-25 ENCOUNTER — Ambulatory Visit: Admit: 2023-02-25 | Discharge: 2023-02-25 | Payer: PRIVATE HEALTH INSURANCE

## 2023-02-25 DIAGNOSIS — F411 Generalized anxiety disorder: Secondary | ICD-10-CM | POA: Diagnosis not present

## 2023-02-25 DIAGNOSIS — Z5111 Encounter for antineoplastic chemotherapy: Secondary | ICD-10-CM | POA: Diagnosis not present

## 2023-02-25 DIAGNOSIS — R238 Other skin changes: Secondary | ICD-10-CM | POA: Diagnosis not present

## 2023-02-25 DIAGNOSIS — C50912 Malignant neoplasm of unspecified site of left female breast: Secondary | ICD-10-CM | POA: Diagnosis not present

## 2023-02-25 DIAGNOSIS — Z170 Estrogen receptor positive status [ER+]: Secondary | ICD-10-CM | POA: Diagnosis not present

## 2023-02-25 DIAGNOSIS — Z510 Encounter for antineoplastic radiation therapy: Principal | ICD-10-CM

## 2023-02-25 MED ORDER — TRAMADOL 50 MG TABLET
ORAL_TABLET | Freq: Four times a day (QID) | ORAL | 0 refills | 8 days | Status: CP | PRN
Start: 2023-02-25 — End: 2024-02-25

## 2023-02-26 ENCOUNTER — Ambulatory Visit
Admit: 2023-02-24 | Payer: PRIVATE HEALTH INSURANCE | Attending: Rehabilitative and Restorative Service Providers" | Primary: Rehabilitative and Restorative Service Providers"

## 2023-02-26 DIAGNOSIS — Z170 Estrogen receptor positive status [ER+]: Secondary | ICD-10-CM | POA: Diagnosis not present

## 2023-02-26 DIAGNOSIS — Z5111 Encounter for antineoplastic chemotherapy: Secondary | ICD-10-CM | POA: Diagnosis not present

## 2023-02-26 DIAGNOSIS — R238 Other skin changes: Secondary | ICD-10-CM | POA: Diagnosis not present

## 2023-02-26 DIAGNOSIS — C50912 Malignant neoplasm of unspecified site of left female breast: Secondary | ICD-10-CM | POA: Diagnosis not present

## 2023-02-27 DIAGNOSIS — F411 Generalized anxiety disorder: Secondary | ICD-10-CM | POA: Diagnosis not present

## 2023-03-02 DIAGNOSIS — F411 Generalized anxiety disorder: Secondary | ICD-10-CM | POA: Diagnosis not present

## 2023-03-03 ENCOUNTER — Ambulatory Visit
Admit: 2023-03-03 | Payer: PRIVATE HEALTH INSURANCE | Attending: Rehabilitative and Restorative Service Providers" | Primary: Rehabilitative and Restorative Service Providers"

## 2023-03-03 ENCOUNTER — Ambulatory Visit
Admit: 2023-02-24 | Payer: PRIVATE HEALTH INSURANCE | Attending: Rehabilitative and Restorative Service Providers" | Primary: Rehabilitative and Restorative Service Providers"

## 2023-03-03 DIAGNOSIS — F411 Generalized anxiety disorder: Secondary | ICD-10-CM | POA: Diagnosis not present

## 2023-03-13 DIAGNOSIS — F411 Generalized anxiety disorder: Secondary | ICD-10-CM | POA: Diagnosis not present

## 2023-03-14 DIAGNOSIS — F411 Generalized anxiety disorder: Secondary | ICD-10-CM | POA: Diagnosis not present

## 2023-03-20 DIAGNOSIS — F411 Generalized anxiety disorder: Secondary | ICD-10-CM | POA: Diagnosis not present

## 2023-03-21 DIAGNOSIS — F411 Generalized anxiety disorder: Secondary | ICD-10-CM | POA: Diagnosis not present

## 2023-03-25 DIAGNOSIS — Z419 Encounter for procedure for purposes other than remedying health state, unspecified: Secondary | ICD-10-CM | POA: Diagnosis not present

## 2023-03-26 DIAGNOSIS — F411 Generalized anxiety disorder: Secondary | ICD-10-CM | POA: Diagnosis not present

## 2023-03-27 DIAGNOSIS — F411 Generalized anxiety disorder: Secondary | ICD-10-CM | POA: Diagnosis not present

## 2023-03-28 DIAGNOSIS — F411 Generalized anxiety disorder: Secondary | ICD-10-CM | POA: Diagnosis not present

## 2023-03-31 DIAGNOSIS — C50912 Malignant neoplasm of unspecified site of left female breast: Principal | ICD-10-CM

## 2023-03-31 DIAGNOSIS — Z170 Estrogen receptor positive status [ER+]: Principal | ICD-10-CM

## 2023-03-31 MED ORDER — CAPECITABINE 500 MG TABLET
ORAL_TABLET | Freq: Two times a day (BID) | ORAL | 3 refills | 14 days | Status: CP
Start: 2023-03-31 — End: ?
  Filled 2023-04-03: qty 112, 21d supply, fill #0

## 2023-04-01 NOTE — Unmapped (Addendum)
Pharmacist Medication Follow-Up  I called the patient to f/u on capecitabine.  I will call her tomorrow per her preference.

## 2023-04-01 NOTE — Unmapped (Signed)
[facility] Specialty Medication Onboarding    Specialty Medication: Capecitabine  Prior Authorization: Not Required   Financial Assistance: No - copay  <$25  Final Copay/Day Supply: $4 / 21    Insurance Restrictions: None     Notes to Pharmacist:   Credit Card on File: no    The triage team has completed the benefits investigation and has determined that the patient is able to fill this medication at [facility]. Please contact the patient to complete the onboarding or follow up with the prescribing physician as needed.

## 2023-04-02 DIAGNOSIS — F411 Generalized anxiety disorder: Secondary | ICD-10-CM | POA: Diagnosis not present

## 2023-04-02 NOTE — Unmapped (Signed)
Pharmacist Oral Chemotherapy Education    Medication: Capecitabine    Medication Education     Oral chemotherapy regimen: Capecitabine 2000 mg po BID x 14 days on and 7 days off x 6 months  Tentative Start Date: Pending  Pharmacy: [facility]     Medication indication, administration twice daily after a meal, regimen schedule, and oral chemotherapy handling precautions were reviewed with the patient.  Side effects discussed included but were not limited to: nausea/vomiting, complications of myelosuppression, mucositis, diarrhea, hand/foot syndrome, and fatigue.  I discussed applying a thin layer of diclofenac gel 1% twice daily to hands and feet to help reduce the risk of hand/foot syndrome.  I also instructed the patient to apply lotion to hands and feet twice daily to reduce risk of this adverse effect.  Avoidance of excessive friction/rubbing of hands and feet as well as wearing good-fitting, comfortable shoes can also reduce irritation.  I also discussed the use of Imodium to treat diarrhea, good oral care with salt water rinses to reduce the risk of mouth sores, and the use of anti-emetics to treat nausea/vomiting.    Drug interactions: Omeprazole may decrease capecitabine concentrations.  The clinical significance of this interaction is not clear and no modifications are recommended.    Handout provided via My Chart: Chemotherapy handout from [facility]    Patient verbalized understanding of the above information as well as how to contact the Team with any questions/concerns.    Approximate time on the phone with patient: 20 minutes     Tyria Springer Oleh Genin PharmD, BCOP, CPP  Hematology/Oncology Pharmacist  P: [phone]

## 2023-04-02 NOTE — Unmapped (Signed)
[facility]   Patient Onboarding/Medication Counseling    [name] is a 47 y.o. female with breast cancer who I am counseling today on initiation of therapy.  I am speaking to the patient.    Was a Nurse, learning disability used for this call? No    Verified patient's date of birth / HIPAA.    Specialty medication(s) to be sent: Hematology/Oncology: Capecitabine 500mg , directions: 2000mg  2 times a day for 14 days on and 7 days off      Non-specialty medications/supplies to be sent: n/a      Medications not needed at this time: n/a         Xeloda (capecitabine)    Medication & Administration     Dosage: 2000mg  (four 500mg  tablets) 2 times a day for 14 days on and 7 days off    Administration: I reviewed the importance of taking with a full glass of water within 30 minutes of a meal (at least 1 cup of food). Discussed that tablet should be swallowed whole and cannot be crushed or chewed.    Adherence/Missed dose instruction: If a dose is missed, do not take an extra dose or two doses at one time. Simply take your next dose at the regularly scheduled time and record any missed doses so our team is aware.    Goals of Therapy     Prevent disease progression    Side Effects & Monitoring Parameters     Nausea/vomiting  Diarrhea/constipation  Infection precautions  Fatigue  Mouth sores or irritation  Hand/foot syndrome (tingling, numbness, pain, redness, peeling or blistering) Use Urea 20% cream, Aquaphor, Eucerin, Cetaphil, or CeraVe twice daily on hands and feet as preventative  Sun precautions  Decrease appetite/ taste changes  Bleeding precautions (bruising easily, nose bleeds, gums bleed)  Headache  Dry skin  Hair loss    The following side effects should be reported to the provider:  Diarrhea not controlled by anti-diarrheals  Swelling, warmth, numbness, change of color or pain in a leg or arm  Signs of infection (fever >100.4, chills, sore throat, sputum production)  Signs of liver problems (dark urine, abdominal pain, light-colored stools, vomiting, yellow skin or eyes)  Signs of bleeding (vomiting or coughing up blood, blood that looks like coffee grounds, blood in the urine or black, red tarry stools, bruising that gets bigger without reason, any persistent or severe bleeding)  Skin rash or signs of skin infection (cracking, peeling, blistering, bleeding skin, red or irritated eyes)  Signs of fluid and electrolyte problems (mood changes, confusion, abnormal/fast  heartbeat, severe dizziness, passing out, increased thirst, seizures, loss of strength and energy, lack of appetite, unable to pass urine or change in amount of urine passed, dry mouth, dry eyes, or nausea or vomiting.  Signs of hand foot syndrome (redness or irritation on the palms of the hands or soles of the feet)  Signs of mucositis (red or swollen mouth/gums, sores in the mouth, gums or tongue, soreness or pain in the mouth or throat, difficulty swallowing or talking, dryness, mild burning, or pain when eating food white patches or pus in the mouth or on the tongue, increased mucus or thicker saliva)  Signs of anaphylaxis (wheezing, chest tightness, swelling of face, lips, tongue or throat)    Monitoring parameters: Renal function should be estimated at baseline to determine initial dose. During therapy, CBC with differential, hepatic function, and renal function should be monitored. Monitor INR closely if receiving concomitant warfarin. Pregnancy test  prior to treatment initiation (in females of reproductive potential). Monitor for diarrhea, dehydration, hand-foot syndrome, Stevens-Johnson syndrome, toxic epidermal necrolysis, stomatitis, and cardiotoxicity. Monitor adherence.    Contraindications, Warnings & Precautions     [place] Boxed Warning: Capecitabine may increase the anticoagulant effects of warfarin; bleeding events, including death, have occurred with concomitant use. Clinically significant increases in prothrombin time (PT) and INR have occurred within several days to months after capecitabine initiation (in patients previously stabilized on anticoagulants), and may continue up to 1 month after capecitabine discontinuation. May occur in patients with or without liver metastases. Monitor PT and INR frequently and adjust anticoagulation dosing accordingly. An increased risk of coagulopathy is correlated with a cancer diagnosis and age >60 years.    Contraindications:  Known hypersensitivity to capecitabine, fluorouracil, or any component of the formulation;   Severe renal impairment (CrCl <30 mL/minute)    Warnings and Precautions:  Bone marrow suppression  Cardiotoxicity: Myocardial infarction, ischemia, angina, dysrhythmias, cardiac arrest, cardiac failure, sudden death, ECG changes, and cardiomyopathy  Mouth sores-discussed use of baking soda/salt water rinses  Dermatologic toxicity: Stevens-Johnson syndrome and toxic epidermal necrolysis   Hand/foot prevention (moisturizers, luke warm hand washes, patting hands/feet dry, decrease rubbing on hands/feet)  GI toxicity: May cause diarrhea (may be severe); Importance of good nutrition to help minimize diarrhea (high protein, BRAT, yogurt, avoid greasy and spicy foods).  Importance of hydration if having frequent diarrhea  Reproductive concerns: Evaluate pregnancy status prior to therapy in females of reproductive potential. Females of reproductive potential should use effective contraception during treatment and for 6 months after the last dose. Males with female partners of reproductive potential should use effective contraception during treatment and for 3 months after the last dose. Based on the mechanism of action and data from animal reproduction studies, in utero exposure to capecitabine may cause fetal harm.   Breast feeding considerations: It is not known if capecitabine is present in breast milk. Due to the potential for serious adverse reactions in the breastfed infant, breastfeeding is not recommended by the manufacturer during treatment and for 2 weeks after the last dose.    Drug/Food Interactions     Medication list reviewed in Epic. The patient was instructed to inform the care team before taking any new medications or supplements. No drug interactions identified.   Avoid live vaccines.    Storage, Handling Precautions, & Disposal     This medication should be stored at room temperature and in a dry location. Keep out of reach of others including children and pets. Keep the medicine in the original container with a child-proof top (no pillboxes). Do not throw away or flush unused medication down the toilet or sink. This drug is considered hazardous and should be handled as little as possible.  If someone else helps with medication administration, they should wear gloves.      Current Medications (including OTC/herbals), Comorbidities and Allergies     Current Outpatient Medications   Medication Sig Dispense Refill    albuterol HFA 90 mcg/actuation inhaler Inhale 2 puffs every six (6) hours as needed for wheezing. 8 g 2    aspirin 325 MG tablet Take 1 tablet (325 mg total) by mouth daily.      capecitabine (XELODA) 500 MG tablet Take 4 tablets (2,000 mg total) by mouth two (2) times a day for 14 days on and 7 days off. 112 tablet 3    erythromycin (ROMYCIN) 5 mg/gram (0.5 %) ophthalmic ointment Place a 1/2 inch  ribbon of ointment into the lower eyelid. (Patient not taking: Reported on 02/18/2023) 3.5 g 0    hydrocortisone (ANUSOL-HC) 2.5 % rectal cream Insert into the rectum two (2) times a day. 30 g 5    loratadine (CLARITIN) 10 mg tablet Take 1 tablet (10 mg total) by mouth daily. 30 tablet 2    losartan (COZAAR) 50 MG tablet Take 1 tablet (50 mg total) by mouth daily. 90 tablet 3    mometasone (ELOCON) 0.1 % cream Apply to affected area daily 90 g 1    omeprazole (PRILOSEC) 40 MG capsule Take 1 capsule (40 mg total) by mouth daily. 30 capsule 3    silver sulfADIAZINE (SILVADENE, SSD) 1 % cream Apply to affected area daily 50 g 1    traMADol (ULTRAM) 50 mg tablet Take 1 tablet (50 mg total) by mouth every six (6) hours as needed for pain. 30 tablet 0     No current facility-administered medications for this visit.       No Known Allergies    Patient Active Problem List   Diagnosis    Hypertension    Tobacco abuse    Gastroesophageal reflux disease    Abnormal EKG    Atypical chest pain    Polysubstance abuse (CMS-HCC)    Class 2 severe obesity due to excess calories with serious comorbidity in adult (CMS-HCC)    Mass of lower outer quadrant of left breast    Mass of left axilla    Malignant neoplasm of left breast in female, estrogen receptor positive (CMS-HCC)    Malignant neoplasm of left breast in female, estrogen receptor positive, unspecified site of breast (CMS-HCC)    Hypomagnesemia       Reviewed and up to date in Epic.    Appropriateness of Therapy     Acute infections noted within Epic:  No active infections  Patient reported infection: None    Is medication and dose appropriate based on diagnosis and infection status? Yes    Prescription has been clinically reviewed: Yes    Patient-Reported Symptoms Tracker for Cancer Patients on Oral Chemotherapy     Oral chemotherapy medication name(s): capecitabine  Dose and frequency: 2000mg  2 times a day for 14 days on and 7 days off  Oral Chemotherapy Start Date: 04/09/2023  Baseline? Yes  Clinic(s) visited: Breast    Symptom Grouping Question Patient Response   Digestion and Eating Have you felt sick to your stomach? Denies    Had diarrhea? Denies    Constipated? Denies    Not wanting to eat? Denies    Comments      Sleep and Pain Felt very tired even after you rest? Denies    Pain due to cancer medication or cancer? Denies    Comments     Other Side Effects Numbness or tingling in hands and/or feet? Denies    Felt short of breath? Denies    Mouth or throat Sores? Denies    Rash? Denies    Palmar-plantar erythrodysesthesia syndrome?      Rash - acneiform? Rash - maculo-papular?      How many days over the past month did your cancer medication or cancer keep you from your normal activities?  Write in number of days, 0-30:  0    Other side effects or things you would like to discuss?      Comments?     Adherence  In the last 30 days, on how many days did you  miss at least one dose of any of your [drug name]? Write in number of days, 0-30:  new start    What reasons are you having trouble taking your medication [pharmacist: check all that apply]? Specify chemotherapy cycle:        No problems identified    Comments:        Comments       Optional Symptom Tracking Comments:      Financial Information     Medication Assistance provided: None Required    Anticipated copay of $4 reviewed with patient. Verified delivery address.    Delivery Information     Scheduled delivery date: 04/03/23    Expected start date: ~04/09/23      Medication will be delivered via Same Day Courier to the prescription address in [facility].  This shipment will not require a signature.      Explained the services we provide at [facility] and that each month we would call to set up refills.  Stressed importance of returning phone calls so that we could ensure they receive their medications in time each month.  Informed patient that we should be setting up refills 7-10 days prior to when they will run out of medication.  A pharmacist will reach out to perform a clinical assessment periodically.  Informed patient that a welcome packet, containing information about our pharmacy and other support services, a Notice of Privacy Practices, and a drug information handout will be sent.      The patient or caregiver noted above participated in the development of this care plan and knows that they can request review of or adjustments to the care plan at any time.      Patient or caregiver verbalized understanding of the above information as well as how to contact the pharmacy at [phone] option 4 with any questions/concerns.  The pharmacy is open Monday through Friday 8:30am-4:30pm.  A pharmacist is available 24/7 via pager to answer any clinical questions they may have.    Patient Specific Needs     Does the patient have any physical, cognitive, or cultural barriers? No    Does the patient have adequate living arrangements? (i.e. the ability to store and take their medication appropriately) Yes    Did you identify any home environmental safety or security hazards? No    Patient prefers to have medications discussed with  Patient     Is the patient or caregiver able to read and understand education materials at a high school level or above? Yes    Patient's primary language is  English     Is the patient high risk? Yes, patient is taking oral chemotherapy. Appropriateness of therapy as been assessed    SOCIAL DETERMINANTS OF HEALTH     At the [facility], we have learned that life circumstances - like trouble affording food, housing, utilities, or transportation can affect the health of many of our patients.   That is why we wanted to ask: are you currently experiencing any life circumstances that are negatively impacting your health and/or quality of life? No    Social Determinants of Health     Financial Resource Strain: High Risk (03/21/2022)    Overall Financial Resource Strain (CARDIA)     Difficulty of Paying Living Expenses: Hard   Internet Connectivity: Not on file   Food Insecurity: Food Insecurity Present (03/21/2022)    Hunger Vital Sign     Worried About Running  Out of Food in the Last Year: Sometimes true     Ran Out of Food in the Last Year: Sometimes true   Tobacco Use: High Risk (01/29/2023)    Patient History     Smoking Tobacco Use: Every Day     Smokeless Tobacco Use: Never     Passive Exposure: Not on file   Housing/Utilities: Low Risk  (03/21/2022)    Housing/Utilities     Within the past 12 months, have you ever stayed: outside, in a car, in a tent, in an overnight shelter, or temporarily in someone else's home (i.e. couch-surfing)?: No     Are you worried about losing your housing?: No     Within the past 12 months, have you been unable to get utilities (heat, electricity) when it was really needed?: No   Alcohol Use: Not on file   Transportation Needs: Unmet Transportation Needs (03/21/2022)    PRAPARE - Transportation     Lack of Transportation (Medical): No     Lack of Transportation (Non-Medical): Yes   Substance Use: Not on file   Health Literacy: Low Risk  (04/26/2021)    Health Literacy     : Never   Physical Activity: Not on file   Interpersonal Safety: Not on file   Stress: Not on file   Intimate Partner Violence: Not on file   Depression: Not at risk (12/04/2020)    PHQ-2     PHQ-2 Score: 0   Social Connections: Not on file       Would you be willing to receive help with any of the needs that you have identified today? Not applicable       Rollen Sox, [facility]  [facility] Specialty Pharmacist

## 2023-04-04 DIAGNOSIS — F411 Generalized anxiety disorder: Secondary | ICD-10-CM | POA: Diagnosis not present

## 2023-04-07 NOTE — Unmapped (Signed)
Breast Oncology Return Patient Evaluation  Referring Physician: Pcp, None Per Patient  [address]  [place],  [place] [address].  PCP: Erin Mccallum, MD    Cancer Team  Surgical Oncology: Sherilyn Cooter, DO  Surgical Oncology NP: Pollyann Samples, NP and Sonda Primes, NP  Radiation Oncology: Rogelio Seen, MD  Medical Oncology: Adonis Brook, MD  Plastic Surgery: None at this time  Genetics: None at this time Blood drawn today.    Reason for Visit: A 47 y.o. female with breast cancer referred for consultation for recommendations concerning the management of breast cancer.    Cancer stage   Cancer Staging   Malignant neoplasm of left breast in female, estrogen receptor positive (CMS-HCC)  Staging form: Breast, AJCC 8th Edition  - Clinical stage from 03/11/2022: Stage IIIB (cT2, cN2(f), cM0, G3, ER+, PR-, HER2-) - Signed by Talbert Cage, DO on 03/12/2022  - Pathologic stage from 12/11/2022: No Stage Recommended (ypT3, pN2a, G3, ER+, PR-, HER2-) - Signed by Rudie Meyer, ANP on 12/11/2022      [name] is a 47 year old lady recently dx with left breast cancer.     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 She was randomized to no upfront RT boost.  She has had multiple delays in her chemotherapy treatment for various reasons. Including infection and cytopenias. She completed last dose of ddAC on 12/4.     She is s/p left breast lumpectomy and axillary LN dissection of axillary staging on 11/27/2022 with 9 of 28 lymph nodes positive for macro metastatic carcinoma with the size of the largest tumor deposit 33 mm, treatment effect identified in multiple lymph nodes, extensive lymphovascular space invasion, and extranodal extension present less than 2 mm.  Left breast partial mastectomy significant for invasive ductal carcinoma with tumor size 64 mm in greatest dimension ER+(2%), PR -, HER2- (IHC 1+). She has Residual Cancer Burden Class: RCB-III       Adjuvant systemic therapy:  -Previously discuss option of enrolling in the ASCENT-05/OptimICE-RD (AFT-65): Phase 3, randomized, open-label study of adjuvant sacituzumab govitecan + pembrolizumab (pembro) vs pembro ?? capecitabine in patients with triple-negative breast cancer and residual disease. After meeting with study coordinator she declines.   -We will proceed with SOC in the adjuvant setting for patient with TNBC with residual disease using Capecitabine while continuing Pembrolizumab every 6 weeks for 8 cycles.     Capecitabine 2000 mg po BID x 14 days on and 7 days off x 6 months   She has received drug but would like to wait to start until after a planned trip later this month. She will begin this therapy on 04/25/23.    Staging Studies  - Complete staging with CT c/a/p, NM Bone scan; shows no evidence of distant metastasis . There was indeterminate left supraclavicular measuring up to 1 cm and subcentimeter right axillary lymph nodes measuring up to 0.5 cm; not considered suspicious and would not change management; monitor in follow up. We were unable to obtain a biospy of the 9 mm oval mass in the left breast that was seen on MRI of breast; patient declined MRI guided breast biospy.     -Repeat staging scans as above after surgery completed showed no evidence of distant metastasis  Cardiotoxic drug monitoring  -ECHO on 04/22/22 was WNL EF 60-65%  -Repeat Echo ordered    Fertility   -She reports hx of partial hysterectomy; reports she still has ovaries.   -She is sexual active; we reviewed chemotherapy precautions     Genetics:   - Genetic testing: STAT Panel plus ATM and CHEK2 sent to [facility] - Negative     Supportive Care  - HTN: patient taking hydrochlorothiazide. BP  elevated today; advised continue to follow up with [facility] family practice  -Tobacco use we discussed smoking cessation; she is interested. Referral placed.  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 female who returns today for follow up and continuation of treatment for recently dx breast cancer .    Interval hx:   -Reports feeling well, continues to have some fatigue but this is improving.   -She denies fever, chills and no pain, no cough or shortness of breath.   -She is currently a smoker but reports she has cut back significantly.      In summary:   [name] first noticed a knot in her upper outer left breast around 4-5 months ago (~ Dec 2022) She says she used to have boils on her breast when she was younger and says they would pop up, drain, and go away. She figured that this was a similar event, but became concerned when the lesion did not resolve.     She underwent diagnostic imaging on 02/12/22, which revealed a 3.2 x 1.8 x 2.4 cm irregular cystic and solid mass at the 6:00 5 CFN of the left breast. Echogenic foci within the mass likely correspond to pleomorphic calcifications seen on mammography. Additionally, there is a 1.0 x 0.5 x 0.7 cm hypoechoic circumscribed mass with internal vascularity at the 6:00 2 CFN of the left breast. Targeted ultrasound of the left axilla completed on 03/03/22 demonstrates multiple abnormally enlarged lymph nodes, largest demonstrating a cortical thickness of 1.9 cm. Lastly, in the right breast 9:00 retroareolar there is a 0.6 x 0.4 x  0.5 cm irregular hypoechoic mass. Subsequent biopsy of the 6 o'clock 5 CFN left breast lesion and axillary lesion revealed IDC, G3, ER+(2%), ER-, Her2-(1+). Biopsy of the 6 o'clock 2 CFN left breast lesion and right breast retroareolar lesion demonstrated benign breast tissue with stromal fibrosis and apocrine cysts.   She is now s/p Neoadjuvant chemotherapy and partial mastectomy; completing  adjuvant radiation next week.      Performance status= ECOG 0    Breast Cancer History  Hematology/Oncology History Overview Note   2023: Left breast cT2 cN2 IDC, G3, weakly+2%/-/-     Malignant neoplasm of left breast in female, estrogen receptor positive (CMS-HCC)   02/12/2022 -  Presenting Symptoms    Self palpated left breast mass    MMG/US: Left breast 6:00 5 CFN there is a 3.2 x 1.8 x 2.4 cm irregular cystic and solid mass. Echogenic foci within the mass likely correspond to pleomorphic calcifications seen on mammography.  Left breast 6:00 2 CFN there is a 1.0 x 0.5 x 0.7 cm hypoechoic circumscribed mass with internal vascularity.  Left axilla there is multiple abnormally enlarged lymph nodes, largest demonstrating a cortical thickness of 1.9 cm.  Right breast 9:00 retroareolar there is  a 0.6 x 0.4 x  0.5 cm irregular hypoechoic mass.     03/03/2022 Biopsy    Left breast core biopsy, 6:00 5 CFN: IDC, G3, ER+(2%), ER-, Her2-(1+).    Left breast core biopsy, 6:00 2 CFN: benign breast tissue with stromal fibrosis and apocrine cysts.    Left axilla core biopsy: IDC, G3, ER+(2%), ER-, Her2-(1+).    Right breast core biopsy, retroareolar: benign breast tissue with stromal fibrosis and apocrine cysts.     03/11/2022 Initial Diagnosis    Malignant neoplasm of left breast in female, estrogen receptor positive (CMS-HCC)     03/11/2022 -  Cancer Staged    Staging form: Breast, AJCC 8th Edition  - Clinical stage from 03/11/2022: Stage IIIB (cT2, cN2(f), cM0, G3, ER+, PR-, HER2-) - Signed by Talbert Cage, DO on 03/12/2022       03/12/2022 Tumor Board    MDC recs: Left breast cT2 N1 IDC, G3, ER+(2%), PR-, HER2-  Imaging review with 3.2 cm area bx proven IDC and multiple (>7) enlarged axillary LNs. Additional left breast bx benign and concordant. Right breast bx benign and concordant.  NACT given locally advanced, schedule with med/onc add on next week  Staging studies  Possible PRAD trial candidate  Genetics: Meets GT criteria based on dx br cancer <50yo. Stat breast panel + ATM + CHEK2 and econsult     03/25/2022 Interval Scan(s)      MRI bilateral breast completed revealed 3.7 cm transverse by 3.3 cm AP by 2.6 cm craniocaudal. It demonstrates central necrosis with peripheral nodular enhancement. There is also an oval mass measuring 9 mm in the left breast, this mass has not been biopsied. However biopsied was recommended since patient is considering breast conservation surgery post chemotherapy. Patient was scheduled for biopsy of the 9 mm mass, but ultimately declined biopsy due to claustrophobia.     04/23/2022 -  Chemotherapy    STUDY [id number] IRB# [id number] NEOADJUVANT PEMBROLIZUMAB +/- RT WITH PACLItaxel / CARBOplatin FOLLOWED BY ddAC (v. 05/16/21)  P-RAD: A Randomized Study of Preoperative Chemotherapy, Pembrolizumab and No, Low or High Dose RADiation in Node-Positive, HER2-Negative Breast Cancer     12/11/2022 -  Cancer Staged    Staging form: Breast, AJCC 8th Edition  - Pathologic stage from 12/11/2022: No Stage Recommended (ypT3, pN2a, G3, ER+, PR-, HER2-) - Signed by Rudie Meyer, ANP on 12/11/2022       Malignant neoplasm of left breast in female, estrogen receptor positive, unspecified site of breast (CMS-HCC)   03/25/2022 Interval Scan(s)      MRI bilateral breast completed revealed 3.7 cm transverse by 3.3 cm AP by 2.6 cm craniocaudal. It demonstrates central necrosis with peripheral nodular enhancement. There is also an oval mass measuring 9 mm in the left breast, this mass has not been biopsied. However biopsied was recommended since patient is considering breast conservation surgery post chemotherapy. Patient was scheduled for biopsy of the 9 mm mass, but ultimately declined biopsy due to claustrophobia.     03/26/2022 Initial Diagnosis    Malignant neoplasm of left breast in female, estrogen receptor positive, unspecified site of breast (CMS-HCC)     03/26/2022 - 06/08/2022 Radiation    Radiation Therapy Treatment Details (03/26/2022 - 06/08/2022)  Site: Right Breast  Technique: IMRT  Goal: Curative  Planned Treatment Start Date: No planned start date specified     01/01/2023 -  Radiation    Radiation Therapy Treatment Details (Noted on 01/01/2023)  Site: Left Breast with lymph nodes  Technique: VMAT  Goal: No goal specified  Planned Treatment Start Date: No planned start date specified         Past Medical History  Past Medical History:   Diagnosis Date    Hypertension     Malignant neoplasm of left breast in female, estrogen receptor positive (CMS-HCC) 03/11/2022     HTN - stopped taking hydrochlorothiazide. Goes to [facility] family practice.    GERD - taking PRN PPI  Anxiety - not treated        Reproductive/GYN History  OB History   Gravida Para Term Preterm AB Living   4 4 4  0 0 0   SAB IAB Ectopic Molar Multiple Live Births   0 0 0 0 0 0      # Outcome Date GA Lbr Len/2nd Weight Sex Delivery Anes PTL Lv   4 Term            3 Term            2 Term            1 Term                Surgical History  Past Surgical History:   Procedure Laterality Date    BREAST BIOPSY      CHEMOTHERAPY      HYSTERECTOMY      IR INSERT PORT AGE GREATER THAN 5 YRS  04/23/2022    IR INSERT PORT AGE GREATER THAN 5 YRS 04/23/2022 Dorene Ar, PA IMG VIR [facility]    PR INTRAOPERATIVE SENTINEL LYMPH NODE ID W DYE INJECTION Left 11/27/2022    Procedure: INTRAOPERATIVE IDENTIFICATION SENTINEL LYMPH NODE(S) INCLUDE INJECTION NON-RADIOACTIVE DYE, WHEN PERFORMED;  Surgeon: Talbert Cage, DO;  Location: OR [facility];  Service: Surgical Oncology Breast    PR MASTECTOMY, PARTIAL Left 11/27/2022    Procedure: MASTECTOMY, PARTIAL (EG, LUMPECTOMY, TYLECTOMY, QUADRANTECTOMY, SEGMENTECTOMY);  Surgeon: Talbert Cage, DO;  Location: OR [facility];  Service: Surgical Oncology Breast    PR REMOVE ARMPITS LYMPH NODES COMPLT Left 11/27/2022    Procedure: AXILLARY LYMPHADENECTOMY; COMPLETE;  Surgeon: Talbert Cage, DO;  Location: OR [facility];  Service: Surgical Oncology Breast       Medications    Current Outpatient Medications:     albuterol HFA 90 mcg/actuation inhaler, Inhale 2 puffs every six (6) hours as needed for wheezing., Disp: 8 g, Rfl: 2    aspirin 325 MG tablet, Take 1 tablet (325 mg total) by mouth daily., Disp: , Rfl:     capecitabine (XELODA) 500 MG tablet, Take 4 tablets (2,000 mg total) by mouth two (2) times a day for 14 days on and 7 days off., Disp: 112 tablet, Rfl: 3    erythromycin (ROMYCIN) 5 mg/gram (0.5 %) ophthalmic ointment, Place a 1/2 inch ribbon of ointment into the lower eyelid. (Patient not taking: Reported on 02/18/2023), Disp: 3.5 g, Rfl: 0    hydrocortisone (ANUSOL-HC) 2.5 % rectal cream, Insert into the rectum two (2) times a day., Disp: 30 g, Rfl: 5    loratadine (CLARITIN) 10 mg tablet, Take 1 tablet (10 mg total) by mouth daily., Disp: 30 tablet, Rfl: 2    losartan (COZAAR) 50 MG tablet, Take 1 tablet (50 mg total) by mouth daily., Disp: 90 tablet, Rfl: 3    mometasone (ELOCON) 0.1 % cream, Apply to affected area daily, Disp: 90 g, Rfl: 1    omeprazole (PRILOSEC) 40 MG capsule, Take 1 capsule (40 mg total)  by mouth daily., Disp: 30 capsule, Rfl: 3    silver sulfADIAZINE (SILVADENE, SSD) 1 % cream, Apply to affected area daily, Disp: 50 g, Rfl: 1    traMADol (ULTRAM) 50 mg tablet, Take 1 tablet (50 mg total) by mouth every six (6) hours as needed for pain., Disp: 30 tablet, Rfl: 0    Allergies  No Known Allergies    Personal and Social History  Social History     Social History Narrative    The patient is committed relationship.  She works as Scientist, research (medical). She has four children.   Lives in [place], [place] with her long-term boyfriend, Shon Baton, and two of her children. She has 4 children and now 5 grand children aged 77mo-23yrs. 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 Loves spending time at [employer]. Has been to the beach around 5 times the past year including [address], [address].     Smoking 1ppd x 10-15 years. Has quit before.  MJ smoking 1-4 joints per day  No cocaine, heroin, no IVDU  EtOH socially 2-3x weekly      Family History  Family History   Problem Relation Age of Onset    Diabetes Mother     Hypertension Mother     Parkinsonism Father     No Known Problems Sister     No Known Problems Daughter     No Known Problems Maternal Grandmother     No Known Problems Maternal Grandfather     Ovarian cancer Paternal Grandmother 83    Cancer Paternal Uncle         colon or prostate ?age    Breast cancer Paternal Cousin 62        recent dx, limited cntact    Breast cancer Other         3x paternal great aunts (PGF sisters) with breast ca ?ages; there may be other more distant paternal cousins w/ bresat ca    BRCA 1/2 Neg Hx     Colon cancer Neg Hx     Endometrial cancer Neg Hx    Parents still alive. Mother with DM. Father with parkinsons.   Uncle (paternal) cancer  Uncle (paternal) prostate  MGF colon cancer  Paternal aunt with breast cancer      Review of Systems: A 12-system review of systems was obtained including: Constitutional, Eyes, ENT, Cardiovascular, Respiratory, GI, GU, Musculoskeletal, Skin, Neurological, Psychiatric, Endocrine, Heme/Lymphatic, and Allergic/Immunologic systems. It is negative or non-contributory to the patient???s management except for the following: See Interval History.    Physical Examination:  Vital Signs: BP 133/89  - Pulse 84  - Temp 36.7 ??C (98 ??F) (Temporal)  - Resp 18  - Ht 162.6 cm (5' 4)  - Wt 87 kg (191 lb 12.8 oz)  - SpO2 100%  - BMI 32.92 kg/m??   General:  Healthy-appearing female in no acute distress..  Cardiovasc:  No heaves, regular, no additional sounds. No lower extremity edema.    Respiratory:  Chest clear to percussion and auscultation, unlabored. Implanted port located in upper right chest, site clean, dry no swelling or erythema.  Gastrointestinal:  Soft, nontender, no hepatomegaly.   Musculoskeletal:  No bony pain or tenderness.   Skin and Subcutaneous Tissues:  Negative. No rash, ecchymoses, purpuric lesions.  Psychiatric: Mood is normal. Appropriately anxious.  No other symptoms.   Neuro:  Alert and oriented.  Gait and coordination normal  Upper Extremity Lymphedema: None.  Breasts: hyperpigmented areas on left chest wall post RT. left axillary incision is well healed no swelling or erythema with stitch in placed.     I have personally reviewed the following diagnostic studies:

## 2023-04-08 ENCOUNTER — Ambulatory Visit: Admit: 2023-04-08 | Discharge: 2023-04-09 | Payer: PRIVATE HEALTH INSURANCE

## 2023-04-08 ENCOUNTER — Other Ambulatory Visit: Admit: 2023-04-08 | Discharge: 2023-04-09 | Payer: PRIVATE HEALTH INSURANCE

## 2023-04-08 DIAGNOSIS — Z5112 Encounter for antineoplastic immunotherapy: Secondary | ICD-10-CM | POA: Diagnosis not present

## 2023-04-08 DIAGNOSIS — Z170 Estrogen receptor positive status [ER+]: Secondary | ICD-10-CM | POA: Diagnosis not present

## 2023-04-08 DIAGNOSIS — C50912 Malignant neoplasm of unspecified site of left female breast: Secondary | ICD-10-CM | POA: Diagnosis not present

## 2023-04-08 DIAGNOSIS — I10 Essential (primary) hypertension: Secondary | ICD-10-CM | POA: Diagnosis not present

## 2023-04-08 DIAGNOSIS — Z79899 Other long term (current) drug therapy: Secondary | ICD-10-CM | POA: Diagnosis not present

## 2023-04-08 DIAGNOSIS — N6323 Unspecified lump in the left breast, lower outer quadrant: Secondary | ICD-10-CM | POA: Diagnosis not present

## 2023-04-08 DIAGNOSIS — F17200 Nicotine dependence, unspecified, uncomplicated: Secondary | ICD-10-CM | POA: Diagnosis not present

## 2023-04-08 LAB — COMPREHENSIVE METABOLIC PANEL
ALBUMIN: 3.5 g/dL (ref 3.4–5.0)
ALKALINE PHOSPHATASE: 66 U/L (ref 46–116)
ALT (SGPT): 16 U/L (ref 10–49)
ANION GAP: 6 mmol/L (ref 5–14)
AST (SGOT): 19 U/L (ref <=34)
BILIRUBIN TOTAL: 0.3 mg/dL (ref 0.3–1.2)
BLOOD UREA NITROGEN: 12 mg/dL (ref 9–23)
BUN / CREAT RATIO: 14
CALCIUM: 9.5 mg/dL (ref 8.7–10.4)
CHLORIDE: 108 mmol/L — ABNORMAL HIGH (ref 98–107)
CO2: 27 mmol/L (ref 20.0–31.0)
CREATININE: 0.85 mg/dL
EGFR CKD-EPI (2021) FEMALE: 85 mL/min/{1.73_m2} (ref >=60)
GLUCOSE RANDOM: 92 mg/dL (ref 70–179)
POTASSIUM: 4.2 mmol/L (ref 3.4–4.8)
PROTEIN TOTAL: 7 g/dL (ref 5.7–8.2)
SODIUM: 141 mmol/L (ref 135–145)

## 2023-04-08 LAB — CBC W/ AUTO DIFF
BASOPHILS ABSOLUTE COUNT: 0 10*9/L (ref 0.0–0.1)
BASOPHILS RELATIVE PERCENT: 0.1 %
EOSINOPHILS ABSOLUTE COUNT: 0 10*9/L (ref 0.0–0.5)
EOSINOPHILS RELATIVE PERCENT: 0.8 %
HEMATOCRIT: 34.6 % (ref 34.0–44.0)
HEMOGLOBIN: 11.4 g/dL (ref 11.3–14.9)
LYMPHOCYTES ABSOLUTE COUNT: 0.9 10*9/L — ABNORMAL LOW (ref 1.1–3.6)
LYMPHOCYTES RELATIVE PERCENT: 28.9 %
MEAN CORPUSCULAR HEMOGLOBIN CONC: 32.9 g/dL (ref 32.0–36.0)
MEAN CORPUSCULAR HEMOGLOBIN: 25.1 pg — ABNORMAL LOW (ref 25.9–32.4)
MEAN CORPUSCULAR VOLUME: 76.1 fL — ABNORMAL LOW (ref 77.6–95.7)
MEAN PLATELET VOLUME: 7.7 fL (ref 6.8–10.7)
MONOCYTES ABSOLUTE COUNT: 0.4 10*9/L (ref 0.3–0.8)
MONOCYTES RELATIVE PERCENT: 11.4 %
NEUTROPHILS ABSOLUTE COUNT: 1.9 10*9/L (ref 1.8–7.8)
NEUTROPHILS RELATIVE PERCENT: 58.8 %
PLATELET COUNT: 177 10*9/L (ref 150–450)
RED BLOOD CELL COUNT: 4.54 10*12/L (ref 3.95–5.13)
RED CELL DISTRIBUTION WIDTH: 16 % — ABNORMAL HIGH (ref 12.2–15.2)
WBC ADJUSTED: 3.2 10*9/L — ABNORMAL LOW (ref 3.6–11.2)

## 2023-04-08 LAB — TSH: THYROID STIMULATING HORMONE: 0.712 u[IU]/mL (ref 0.550–4.780)

## 2023-04-08 LAB — T4, FREE: FREE T4: 0.97 ng/dL (ref 0.89–1.76)

## 2023-04-08 MED ADMIN — heparin, porcine (PF) 100 unit/mL injection 500 Units: 500 [IU] | INTRAVENOUS | @ 22:00:00 | Stop: 2023-04-09

## 2023-04-08 MED ADMIN — sodium chloride (NS) 0.9 % infusion: 100 mL/h | INTRAVENOUS | @ 21:00:00

## 2023-04-08 MED ADMIN — pembrolizumab (KEYTRUDA) 400 mg in sodium chloride (NS) 0.9 % 50 mL IVPB: 400 mg | INTRAVENOUS | @ 21:00:00 | Stop: 2023-04-08

## 2023-04-08 NOTE — Unmapped (Signed)
It was a pleasure to see you today in the Breast Medical Oncology Clinic.      For clinical concerns during working hours, please call [phone].     For clinical trial questions please call the study coordinator.     For emergencies, evenings or weekends, please call [phone] and ask for the oncology fellow on call.  Reasons to call the emergency line may include:  - Fever of 100.5 or greater  - Nausea and/or vomiting not relieved with nausea medicine  - Diarrhea or constipation not relieved with bowel regimen  - Severe pain not relieved with usual pain regimen     Volteran gel use to prevent hand and foot syndrome   Pick up imodium to use as needed for diarrhea

## 2023-04-09 DIAGNOSIS — F411 Generalized anxiety disorder: Secondary | ICD-10-CM | POA: Diagnosis not present

## 2023-04-09 NOTE — Unmapped (Signed)
Patient presented for a pembrolizumab infusion today. Patient denied any pain, shortness of breath or nausea during treatment; no s/s of reaction noted. Patient tolerated treatment well and was stable at time of discharge; self-ambulated to lobby.

## 2023-04-09 NOTE — Unmapped (Signed)
Lab on 04/08/2023   Component Date Value Ref Range Status    Sodium 04/08/2023 141  135 - 145 mmol/L Final    Potassium 04/08/2023 4.2  3.4 - 4.8 mmol/L Final    Chloride 04/08/2023 108 (H)  98 - 107 mmol/L Final    CO2 04/08/2023 27.0  20.0 - 31.0 mmol/L Final    Anion Gap 04/08/2023 6  5 - 14 mmol/L Final    BUN 04/08/2023 12  9 - 23 mg/dL Final    Creatinine 56/21/3086 0.85  0.55 - 1.02 mg/dL Final    BUN/Creatinine Ratio 04/08/2023 14   Final    eGFR CKD-EPI (2021) Female 04/08/2023 85  >=60 mL/min/1.28m2 Final    eGFR calculated with CKD-EPI 2021 equation in accordance with [employer] and [employer] of Nephrology Task Force recommendations.    Glucose 04/08/2023 92  70 - 179 mg/dL Final    Calcium 57/84/6962 9.5  8.7 - 10.4 mg/dL Final    Albumin 95/28/4132 3.5  3.4 - 5.0 g/dL Final    Total Protein 04/08/2023 7.0  5.7 - 8.2 g/dL Final    Total Bilirubin 04/08/2023 0.3  0.3 - 1.2 mg/dL Final    AST 44/11/270 19  <=34 U/L Final    ALT 04/08/2023 16  10 - 49 U/L Final    Alkaline Phosphatase 04/08/2023 66  46 - 116 U/L Final    TSH 04/08/2023 0.712  0.550 - 4.780 uIU/mL Final    Free T4 04/08/2023 0.97  0.89 - 1.76 ng/dL Final    WBC 53/66/4403 3.2 (L)  3.6 - 11.2 10*9/L Final    RBC 04/08/2023 4.54  3.95 - 5.13 10*12/L Final    HGB 04/08/2023 11.4  11.3 - 14.9 g/dL Final    HCT 47/42/5956 34.6  34.0 - 44.0 % Final    MCV 04/08/2023 76.1 (L)  77.6 - 95.7 fL Final    MCH 04/08/2023 25.1 (L)  25.9 - 32.4 pg Final    MCHC 04/08/2023 32.9  32.0 - 36.0 g/dL Final    RDW 38/75/6433 16.0 (H)  12.2 - 15.2 % Final    MPV 04/08/2023 7.7  6.8 - 10.7 fL Final    Platelet 04/08/2023 177  150 - 450 10*9/L Final    Neutrophils % 04/08/2023 58.8  % Final    Lymphocytes % 04/08/2023 28.9  % Final    Monocytes % 04/08/2023 11.4  % Final    Eosinophils % 04/08/2023 0.8  % Final    Basophils % 04/08/2023 0.1  % Final    Absolute Neutrophils 04/08/2023 1.9  1.8 - 7.8 10*9/L Final    Absolute Lymphocytes 04/08/2023 0.9 (L)  1.1 - 3.6 10*9/L Final    Absolute Monocytes 04/08/2023 0.4  0.3 - 0.8 10*9/L Final    Absolute Eosinophils 04/08/2023 0.0  0.0 - 0.5 10*9/L Final    Absolute Basophils 04/08/2023 0.0  0.0 - 0.1 10*9/L Final    Microcytosis 04/08/2023 Slight (A)  Not Present Final    Hypochromasia 04/08/2023 Moderate (A)  Not Present Final          RED ZONE Means: RED ZONE: Take action now!     You need to be seen right away  Symptoms are at a severe level of discomfort    Call 911 or go to your nearest  Hospital for help     - Bleeding that will not stop    - Hard to breathe    - New  seizure - Chest pain  - Fall or passing out  -Thoughts of hurting    yourself or others      Call 911 if you are going into the RED ZONE                  YELLOW ZONE Means:     Please call with any new or worsening symptom(s), even if not on this list.  Call [phone]  After hours, weekends, and holidays - you will reach a long recording with specific instructions, If not in an emergency such as above, please listen closely all the way to the end and choose the option that relates to your need.   You can be seen by a provider the same day through our Same Day Acute Care for Patients with Cancer program.      YELLOW ZONE: Take action today     Symptoms are new or worsening  You are not within your goal range for:    - Pain    - Shortness of breath    - Bleeding (nose, urine, stool, wound)    - Feeling sick to your stomach and throwing up    - Mouth sores/pain in your mouth or throat    - Hard stool or very loose stools (increase in       ostomy output)    - No urine for 12 hours    - Feeding tube or other catheter/tube issue    - Redness or pain at previous IV or port/catheter site    - Depressed or anxiety   - Swelling (leg, arm, abdomen,     face, neck)  - Skin rash or skin changes  - Wound issues (redness, drainage,    re-opened)  - Confusion  - Vision changes  - Fever >100.4 F or chills  - Worsening cough with mucus that is green, yellow, or bloody  - Pain or burning when going to the    bathroom  - Home Infusion Pump Issue- call    [phone]         Call your healthcare provider if you are going into the YELLOW ZONE     GREEN ZONE Means:  Your symptoms are under controls  Continue to take your medicine as ordered  Keep all visits to the provider GREEN ZONE: You are in control  No increase or worsening symptoms  Able to take your medicine  Able to drink and eat    - DO NOT use MyChart messages to report red or yellow symptoms. Allow up to 3    business days for a reply.  -MyChart is for non-urgent medication refills, scheduling requests, or other general questions.         [id number] Rev. 05/23/2022  Approved by Oncology Patient Education Committee

## 2023-04-13 DIAGNOSIS — F411 Generalized anxiety disorder: Secondary | ICD-10-CM | POA: Diagnosis not present

## 2023-04-17 DIAGNOSIS — F411 Generalized anxiety disorder: Secondary | ICD-10-CM | POA: Diagnosis not present

## 2023-04-21 DIAGNOSIS — F411 Generalized anxiety disorder: Secondary | ICD-10-CM | POA: Diagnosis not present

## 2023-04-22 DIAGNOSIS — F411 Generalized anxiety disorder: Secondary | ICD-10-CM | POA: Diagnosis not present

## 2023-04-22 NOTE — Unmapped (Unsigned)
Just started 04/27/23 time.}    No Known Allergies    Changes to allergies: {Blank:19197::Yes: ***,No}    SPECIALTY MEDICATION ADHERENCE     *** *** {Blank:19197::mg,mg/ml,***}: *** days of medicine on hand   *** *** {Blank:19197::mg,mg/ml,***}: *** days of medicine on hand   *** *** {Blank:19197::mg,mg/ml,***}: *** days of medicine on hand   *** *** {Blank:19197::mg,mg/ml,***}: *** days of medicine on hand   *** *** {Blank:19197::mg,mg/ml,***}: *** days of medicine on hand       Are there any concerns with adherence? {Blank:19197::Yes: ***,No}    Adherence counseling provided? {Blank:19197::Yes: ***,Not needed}    Patient-Reported Symptoms Tracker for Cancer Patients on Oral Chemotherapy     Oral chemotherapy medication name(s): capecitabine  Dose and frequency: 2000mg  2 times a day for 14 days on and 7 days off  Oral Chemotherapy Start Date:    Baseline? No  Clinic(s) visited: Breast    Symptom Grouping Question Patient Response   Digestion and Eating Have you felt sick to your stomach?      Had diarrhea?      Constipated?      Not wanting to eat?      Comments      Sleep and Pain Felt very tired even after you rest?      Pain due to cancer medication or cancer?      Comments     Other Side Effects Numbness or tingling in hands and/or feet?      Felt short of breath?      Mouth or throat Sores?      Rash?      Palmar-plantar erythrodysesthesia syndrome?      Rash - acneiform?      Rash - maculo-papular?      How many days over the past month did your cancer medication or cancer keep you from your normal activities?  Write in number of days, 0-30:       Other side effects or things you would like to discuss?      Comments?     Adherence  In the last 30 days, on how many days did you miss at least one dose of any of your [drug name]? Write in number of days, 0-30:       What reasons are you having trouble taking your medication [pharmacist: check all that apply]? Specify chemotherapy cycle: Comments:        Comments       Optional Symptom Tracking Comments:      CLINICAL MANAGEMENT AND INTERVENTION      Clinical Benefit Assessment:    Do you feel the medicine is effective or helping your condition? {Blank:19197::Yes,No,Patient declined to answer}    Clinical Benefit counseling provided? {Blank:19197::Not needed,Reasonable expectations discussed: ***,Labs from *** show evidence of clinical benefit,Progress note from *** shows evidence of clinical benefit,consulted provider regarding clinical benefit concerns,***}    Acute Infection Status:    Acute infections noted within Epic:  No active infections    Patient reported infection: {Blank single:19197::None,***- patient reported to provider,***- pharmacy reported to provider}    Therapy Appropriateness:    Is therapy appropriate and patient progressing towards therapeutic goals? {Blank:19197::Yes, therapy is appropriate and should be continued,Pharmacist will consult provider}    DISEASE/MEDICATION-SPECIFIC INFORMATION      {clinicspecificinstructions:59274}    Is the patient receiving adequate infection prevention treatment? {Blank:19197::***,Yes, ***,No,Not applicable}    Does the patient have adequate nutritional support? {Blank:19197::***,Yes, ***,No,Not applicable}    PATIENT SPECIFIC  NEEDS     Does the patient have any physical, cognitive, or cultural barriers? {Blank single:19197::No,Yes - ***}    Is the patient high risk? {sschighriskpts:78327}    Did the patient require a clinical intervention? {Blank single:19197::No,Yes (If yes, document using the sscrphintervention smartphrase)}    Does the patient require physician intervention or other additional services (i.e., nutrition, smoking cessation, social work)? {Blank single:19197::No,Yes, ***}    SOCIAL DETERMINANTS OF HEALTH     At the [facility], we have learned that life circumstances - like trouble affording food, housing, utilities, or transportation can affect the health of many of our patients.   That is why we wanted to ask: are you currently experiencing any life circumstances that are negatively impacting your health and/or quality of life? {YES/NO/PATIENTDECLINED:93004}    Social Determinants of Health     Financial Resource Strain: High Risk (03/21/2022)    Overall Financial Resource Strain (CARDIA)     Difficulty of Paying Living Expenses: Hard   Internet Connectivity: Not on file   Food Insecurity: Food Insecurity Present (03/21/2022)    Hunger Vital Sign     Worried About Running Out of Food in the Last Year: Sometimes true     Ran Out of Food in the Last Year: Sometimes true   Tobacco Use: High Risk (01/29/2023)    Patient History     Smoking Tobacco Use: Every Day     Smokeless Tobacco Use: Never     Passive Exposure: Not on file   Housing/Utilities: Low Risk  (03/21/2022)    Housing/Utilities     Within the past 12 months, have you ever stayed: outside, in a car, in a tent, in an overnight shelter, or temporarily in someone else's home (i.e. couch-surfing)?: No     Are you worried about losing your housing?: No     Within the past 12 months, have you been unable to get utilities (heat, electricity) when it was really needed?: No   Alcohol Use: Not on file   Transportation Needs: Unmet Transportation Needs (03/21/2022)    PRAPARE - Transportation     Lack of Transportation (Medical): No     Lack of Transportation (Non-Medical): Yes   Substance Use: Not on file   Health Literacy: Low Risk  (04/26/2021)    Health Literacy     : Never   Physical Activity: Not on file   Interpersonal Safety: Not on file   Stress: Not on file   Intimate Partner Violence: Not on file   Depression: Not at risk (12/04/2020)    PHQ-2     PHQ-2 Score: 0   Social Connections: Not on file       Would you be willing to receive help with any of the needs that you have identified today? {Yes/No/Not applicable:93005}       SHIPPING     Specialty Medication(s) to be Shipped:   {specpharm:59087}    Other medication(s) to be shipped: {Blank:19197::***,No additional medications requested for fill at this time}     Changes to insurance: {Blank:19197::Yes: ***,No}    Delivery Scheduled: {Blank:19197::Yes, Expected medication delivery date: ***.,Yes, Expected medication delivery date: ***.  However, Rx request for refills was sent to the provider as there are none remaining.,Patient declined refill at this time due to ***.,No, cannot schedule delivery at this time as there are outstanding items that need addressed.  This note has been handed off to the provider for follow up.,Due to patient insurance changes, unable  to fill at [facility], please route Rx to *** specialty pharmacy}     Medication will be delivered via {Blank:19197::UPS,Next Day Courier,Same Day Courier,Clinic Courier - *** clinic,***} to the confirmed {Blank:19197::prescription,temporary} address in [facility].    The patient will receive a drug information handout for each medication shipped and additional FDA Medication Guides as required.  Verified that patient has previously received a Conservation officer, historic buildings and a Surveyor, mining.    The patient or caregiver noted above participated in the development of this care plan and knows that they can request review of or adjustments to the care plan at any time.      All of the patient's questions and concerns have been addressed.    Rollen Sox, [facility]   [facility] Shared [facility] Specialty Pharmacist

## 2023-04-25 DIAGNOSIS — Z419 Encounter for procedure for purposes other than remedying health state, unspecified: Secondary | ICD-10-CM | POA: Diagnosis not present

## 2023-04-27 NOTE — Unmapped (Signed)
RADIATION ONCOLOGY TREATMENT COMPLETION NOTE    Encounter Date: 02/26/2023  Patient Name: Erin Gordon  Medical Record Number: [id number]    Referring Physician: No referring provider defined for this encounter.    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      TREATMENT INTENT: curative    CLINICAL TRIAL:  YES (P-RAD)    CHEMOTHERAPY: administered before radiation    RADIATION TREATMENT SUMMARY:          Treatment site Treatment Technique/Modality Energy Dose per fraction Total number  of fractions Total dose Start date End date   L Breast and regional LN IMRT  6 MV 200 cGy 25 5000 cGy 01/15/2023   02/18/2023   Involved IMN IMRT 220cGy 25 5500 cGy 01/15/2023 02/18/2023   Tumor bed and IMN boost IMRT  6 MV 200 cGy 5 1000 cGy 02/19/2023 02/26/2023                 Tumor bed/IMN boost      COMPLETED INTENDED COURSE:  Yes    TREATMENT BREAK > 2 WEEKS:  No    TOLERANCE TO TREATMENT:  Moderate toxicities or complications requiring outpatient intervention(s)    FEEDING TUBE:  no    PLAN FOR FOLLOW-UP: Doroteo Glassman is to return for follow up with me/our group in 3 weeks for skin check      Genia Plants, MD  04/27/23 3:48 PM

## 2023-04-28 DIAGNOSIS — F411 Generalized anxiety disorder: Secondary | ICD-10-CM | POA: Diagnosis not present

## 2023-05-01 DIAGNOSIS — F411 Generalized anxiety disorder: Secondary | ICD-10-CM | POA: Diagnosis not present

## 2023-05-03 DIAGNOSIS — F411 Generalized anxiety disorder: Secondary | ICD-10-CM | POA: Diagnosis not present

## 2023-05-07 DIAGNOSIS — F411 Generalized anxiety disorder: Secondary | ICD-10-CM | POA: Diagnosis not present

## 2023-05-10 DIAGNOSIS — F411 Generalized anxiety disorder: Secondary | ICD-10-CM | POA: Diagnosis not present

## 2023-05-11 NOTE — Unmapped (Signed)
[facility] Shared [facility] Clinical Assessment & Refill Coordination Note    Erin Gordon, DOB: [birth year]  Phone: [phone] (home)     All above HIPAA information was verified with patient.     Was a Nurse, learning disability used for this call? No    Specialty Medication(s):   Hematology/Oncology: Capecitabine 500mg , directions: 2000mg  2 times a day for 14 days on and 7 days off     Current Outpatient Medications   Medication Sig Dispense Refill    albuterol HFA 90 mcg/actuation inhaler Inhale 2 puffs every six (6) hours as needed for wheezing. 8 g 2    aspirin 325 MG tablet Take 1 tablet (325 mg total) by mouth daily.      capecitabine (XELODA) 500 MG tablet Take 4 tablets (2,000 mg total) by mouth two (2) times a day for 14 days on and 7 days off. 112 tablet 3    erythromycin (ROMYCIN) 5 mg/gram (0.5 %) ophthalmic ointment Place a 1/2 inch ribbon of ointment into the lower eyelid. (Patient not taking: Reported on 02/18/2023) 3.5 g 0    hydrocortisone (ANUSOL-HC) 2.5 % rectal cream Insert into the rectum two (2) times a day. 30 g 5    loratadine (CLARITIN) 10 mg tablet Take 1 tablet (10 mg total) by mouth daily. 30 tablet 2    losartan (COZAAR) 50 MG tablet Take 1 tablet (50 mg total) by mouth daily. 90 tablet 3    mometasone (ELOCON) 0.1 % cream Apply to affected area daily 90 g 1    omeprazole (PRILOSEC) 40 MG capsule Take 1 capsule (40 mg total) by mouth daily. 30 capsule 3    silver sulfADIAZINE (SILVADENE, SSD) 1 % cream Apply to affected area daily 50 g 1    traMADol (ULTRAM) 50 mg tablet Take 1 tablet (50 mg total) by mouth every six (6) hours as needed for pain. 30 tablet 0     No current facility-administered medications for this visit.        Changes to medications: Cytlali reports no changes at this time.    No Known Allergies    Changes to allergies: No    SPECIALTY MEDICATION ADHERENCE     Capecitabine 500 mg: 0 days of medicine on hand   Next cycle 05/18/23   Are there any concerns with adherence? No    Adherence counseling provided? Not needed    Patient-Reported Symptoms Tracker for Cancer Patients on Oral Chemotherapy     Oral chemotherapy medication name(s): capecitabine  Dose and frequency: 2000mg  2 times a day for 14 days on and 7 days off  Oral Chemotherapy Start Date: 04/27/2023  Baseline? No  Clinic(s) visited: Breast    Symptom Grouping Question Patient Response   Digestion and Eating Have you felt sick to your stomach? Denies    Had diarrhea? Mild    Constipated? Denies    Not wanting to eat? Denies    Comments      Sleep and Pain Felt very tired even after you rest? Denies    Pain due to cancer medication or cancer? Denies    Comments     Other Side Effects Numbness or tingling in hands and/or feet? Denies    Felt short of breath? Denies    Mouth or throat Sores? Denies    Rash? Denies    Palmar-plantar erythrodysesthesia syndrome?      Rash - acneiform?      Rash - maculo-papular?  How many days over the past month did your cancer medication or cancer keep you from your normal activities?  Write in number of days, 0-30:  0    Other side effects or things you would like to discuss?      Comments?     Adherence  In the last 30 days, on how many days did you miss at least one dose of any of your [drug name]? Write in number of days, 0-30:  0    What reasons are you having trouble taking your medication [pharmacist: check all that apply]? Specify chemotherapy cycle:        No problems identified    Comments:        Comments       Optional Symptom Tracking Comments:      CLINICAL MANAGEMENT AND INTERVENTION      Clinical Benefit Assessment:    Do you feel the medicine is effective or helping your condition? Yes    Clinical Benefit counseling provided? Not needed    Acute Infection Status:    Acute infections noted within Epic:  No active infections    Patient reported infection: None    Therapy Appropriateness:    Is therapy appropriate and patient progressing towards therapeutic goals? Yes, therapy is appropriate and should be continued    DISEASE/MEDICATION-SPECIFIC INFORMATION      N/A    Is the patient receiving adequate infection prevention treatment? Not applicable    Does the patient have adequate nutritional support? Not applicable    PATIENT SPECIFIC NEEDS     Does the patient have any physical, cognitive, or cultural barriers? No    Is the patient high risk? Yes, patient is taking oral chemotherapy. Appropriateness of therapy as been assessed    Did the patient require a clinical intervention? No    Does the patient require physician intervention or other additional services (i.e., nutrition, smoking cessation, social work)? No    SOCIAL DETERMINANTS OF HEALTH     At the [facility], we have learned that life circumstances - like trouble affording food, housing, utilities, or transportation can affect the health of many of our patients.   That is why we wanted to ask: are you currently experiencing any life circumstances that are negatively impacting your health and/or quality of life? No    Social Determinants of Health     Financial Resource Strain: High Risk (03/21/2022)    Overall Financial Resource Strain (CARDIA)     Difficulty of Paying Living Expenses: Hard   Internet Connectivity: Not on file   Food Insecurity: Food Insecurity Present (03/21/2022)    Hunger Vital Sign     Worried About Running Out of Food in the Last Year: Sometimes true     Ran Out of Food in the Last Year: Sometimes true   Tobacco Use: High Risk (01/29/2023)    Patient History     Smoking Tobacco Use: Every Day     Smokeless Tobacco Use: Never     Passive Exposure: Not on file   Housing/Utilities: Low Risk  (03/21/2022)    Housing/Utilities     Within the past 12 months, have you ever stayed: outside, in a car, in a tent, in an overnight shelter, or temporarily in someone else's home (i.e. couch-surfing)?: No     Are you worried about losing your housing?: No     Within the past 12 months, have you been unable to get utilities (heat, electricity) when  it was really needed?: No   Alcohol Use: Not on file   Transportation Needs: Unmet Transportation Needs (03/21/2022)    PRAPARE - Transportation     Lack of Transportation (Medical): No     Lack of Transportation (Non-Medical): Yes   Substance Use: Not on file   Health Literacy: Low Risk  (04/26/2021)    Health Literacy     : Never   Physical Activity: Not on file   Interpersonal Safety: Not on file   Stress: Not on file   Intimate Partner Violence: Not on file   Depression: Not at risk (12/04/2020)    PHQ-2     PHQ-2 Score: 0   Social Connections: Not on file       Would you be willing to receive help with any of the needs that you have identified today? Not applicable       SHIPPING     Specialty Medication(s) to be Shipped:   Hematology/Oncology: Capecitabine 500mg , directions: 2000mg  2 times a day for 14 days on and 7 days off    Other medication(s) to be shipped: No additional medications requested for fill at this time     Changes to insurance: No    Delivery Scheduled: Yes, Expected medication delivery date: 6/.     Medication will be delivered via Next Day Courier to the confirmed prescription address in [facility].    The patient will receive a drug information handout for each medication shipped and additional FDA Medication Guides as required.  Verified that patient has previously received a Conservation officer, historic buildings and a Surveyor, mining.    The patient or caregiver noted above participated in the development of this care plan and knows that they can request review of or adjustments to the care plan at any time.      All of the patient's questions and concerns have been addressed.    Rollen Sox, [facility]   [facility] Shared [facility] Specialty Pharmacist

## 2023-05-11 NOTE — Unmapped (Signed)
Pharmacist Medication Follow-Up  I called the patient to f/u on capecitabine.  I left a VM with my contact information.

## 2023-05-12 MED FILL — CAPECITABINE 500 MG TABLET: ORAL | 21 days supply | Qty: 112 | Fill #1

## 2023-05-15 DIAGNOSIS — F411 Generalized anxiety disorder: Secondary | ICD-10-CM | POA: Diagnosis not present

## 2023-05-18 DIAGNOSIS — F411 Generalized anxiety disorder: Secondary | ICD-10-CM | POA: Diagnosis not present

## 2023-05-19 DIAGNOSIS — F411 Generalized anxiety disorder: Secondary | ICD-10-CM | POA: Diagnosis not present

## 2023-05-20 ENCOUNTER — Other Ambulatory Visit: Admit: 2023-05-20 | Discharge: 2023-05-20 | Payer: PRIVATE HEALTH INSURANCE

## 2023-05-20 ENCOUNTER — Ambulatory Visit: Admit: 2023-05-20 | Discharge: 2023-05-20 | Payer: PRIVATE HEALTH INSURANCE

## 2023-05-20 DIAGNOSIS — Z7982 Long term (current) use of aspirin: Secondary | ICD-10-CM | POA: Diagnosis not present

## 2023-05-20 DIAGNOSIS — C50912 Malignant neoplasm of unspecified site of left female breast: Secondary | ICD-10-CM | POA: Diagnosis not present

## 2023-05-20 DIAGNOSIS — Z5112 Encounter for antineoplastic immunotherapy: Secondary | ICD-10-CM | POA: Diagnosis not present

## 2023-05-20 DIAGNOSIS — I10 Essential (primary) hypertension: Secondary | ICD-10-CM | POA: Diagnosis not present

## 2023-05-20 DIAGNOSIS — Z170 Estrogen receptor positive status [ER+]: Secondary | ICD-10-CM | POA: Diagnosis not present

## 2023-05-20 DIAGNOSIS — Z79899 Other long term (current) drug therapy: Secondary | ICD-10-CM | POA: Diagnosis not present

## 2023-05-20 LAB — COMPREHENSIVE METABOLIC PANEL
ALBUMIN: 3.4 g/dL (ref 3.4–5.0)
ALKALINE PHOSPHATASE: 65 U/L (ref 46–116)
ALT (SGPT): 15 U/L (ref 10–49)
ANION GAP: 5 mmol/L (ref 5–14)
AST (SGOT): 23 U/L (ref <=34)
BILIRUBIN TOTAL: 0.4 mg/dL (ref 0.3–1.2)
BLOOD UREA NITROGEN: 11 mg/dL (ref 9–23)
BUN / CREAT RATIO: 18
CALCIUM: 9.2 mg/dL (ref 8.7–10.4)
CHLORIDE: 108 mmol/L — ABNORMAL HIGH (ref 98–107)
CO2: 26 mmol/L (ref 20.0–31.0)
CREATININE: 0.61 mg/dL
EGFR CKD-EPI (2021) FEMALE: >90 mL/min/{1.73_m2} (ref >=60)
GLUCOSE RANDOM: 89 mg/dL (ref 70–179)
POTASSIUM: 4 mmol/L (ref 3.4–4.8)
PROTEIN TOTAL: 7 g/dL (ref 5.7–8.2)
SODIUM: 139 mmol/L (ref 135–145)

## 2023-05-20 LAB — CBC W/ AUTO DIFF
BASOPHILS ABSOLUTE COUNT: 0 10*9/L (ref 0.0–0.1)
BASOPHILS RELATIVE PERCENT: 0.4 %
EOSINOPHILS ABSOLUTE COUNT: 0 10*9/L (ref 0.0–0.5)
EOSINOPHILS RELATIVE PERCENT: 0.2 %
HEMATOCRIT: 31.6 % — ABNORMAL LOW (ref 34.0–44.0)
HEMOGLOBIN: 10.6 g/dL — ABNORMAL LOW (ref 11.3–14.9)
LYMPHOCYTES ABSOLUTE COUNT: 1.2 10*9/L (ref 1.1–3.6)
LYMPHOCYTES RELATIVE PERCENT: 30.4 %
MEAN CORPUSCULAR HEMOGLOBIN CONC: 33.4 g/dL (ref 32.0–36.0)
MEAN CORPUSCULAR HEMOGLOBIN: 26 pg (ref 25.9–32.4)
MEAN CORPUSCULAR VOLUME: 77.8 fL (ref 77.6–95.7)
MEAN PLATELET VOLUME: 7.4 fL (ref 6.8–10.7)
MONOCYTES ABSOLUTE COUNT: 0.4 10*9/L (ref 0.3–0.8)
MONOCYTES RELATIVE PERCENT: 11 %
NEUTROPHILS ABSOLUTE COUNT: 2.2 10*9/L (ref 1.8–7.8)
NEUTROPHILS RELATIVE PERCENT: 58 %
PLATELET COUNT: 171 10*9/L (ref 150–450)
RED BLOOD CELL COUNT: 4.07 10*12/L (ref 3.95–5.13)
RED CELL DISTRIBUTION WIDTH: 16.3 % — ABNORMAL HIGH (ref 12.2–15.2)
WBC ADJUSTED: 3.8 10*9/L (ref 3.6–11.2)

## 2023-05-20 LAB — T4, FREE: FREE T4: 0.91 ng/dL (ref 0.89–1.76)

## 2023-05-20 LAB — TSH: THYROID STIMULATING HORMONE: 1.054 u[IU]/mL (ref 0.550–4.780)

## 2023-05-20 MED ORDER — LORATADINE 10 MG TABLET
ORAL_TABLET | Freq: Every day | ORAL | 2 refills | 30 days | Status: CP
Start: 2023-05-20 — End: 2023-08-18

## 2023-05-20 MED ORDER — MULTIVITAMIN WITH MINERALS TABLET
ORAL_TABLET | Freq: Every day | ORAL | 2 refills | 120 days | Status: CP
Start: 2023-05-20 — End: 2023-05-20

## 2023-05-20 MED ORDER — OXYBUTYNIN CHLORIDE 5 MG TABLET
ORAL_TABLET | Freq: Three times a day (TID) | ORAL | 11 refills | 30 days | Status: CP | PRN
Start: 2023-05-20 — End: 2024-05-19

## 2023-05-20 MED ADMIN — pembrolizumab (KEYTRUDA) 400 mg in sodium chloride (NS) 0.9 % 50 mL IVPB: 400 mg | INTRAVENOUS | @ 20:00:00 | Stop: 2023-05-20

## 2023-05-20 NOTE — Unmapped (Signed)
Patient arrived to chair 53.  Port accessed in lab with brisk blood return.  Patient tolerated pembrolizumab infusion well.  Port de-accessed and flushed with heparin.  Patient ambulated off unit with spouse in stable condition.

## 2023-05-20 NOTE — Unmapped (Signed)
Lab on 05/20/2023   Component Date Value Ref Range Status    Sodium 05/20/2023 139  135 - 145 mmol/L Final    Potassium 05/20/2023 4.0  3.4 - 4.8 mmol/L Final    Chloride 05/20/2023 108 (H)  98 - 107 mmol/L Final    CO2 05/20/2023 26.0  20.0 - 31.0 mmol/L Final    Anion Gap 05/20/2023 5  5 - 14 mmol/L Final    BUN 05/20/2023 11  9 - 23 mg/dL Final    Creatinine 29/56/2130 0.61  0.55 - 1.02 mg/dL Final    BUN/Creatinine Ratio 05/20/2023 18   Final    eGFR CKD-EPI (2021) Female 05/20/2023 >90  >=60 mL/min/1.57m2 Final    eGFR calculated with CKD-EPI 2021 equation in accordance with [employer] and [employer] of Nephrology Task Force recommendations.    Glucose 05/20/2023 89  70 - 179 mg/dL Final    Calcium 86/57/8469 9.2  8.7 - 10.4 mg/dL Final    Albumin 62/95/2841 3.4  3.4 - 5.0 g/dL Final    Total Protein 05/20/2023 7.0  5.7 - 8.2 g/dL Final    Total Bilirubin 05/20/2023 0.4  0.3 - 1.2 mg/dL Final    AST 32/44/0102 23  <=34 U/L Final    ALT 05/20/2023 15  10 - 49 U/L Final    Alkaline Phosphatase 05/20/2023 65  46 - 116 U/L Final    TSH 05/20/2023 1.054  0.550 - 4.780 uIU/mL Final    Free T4 05/20/2023 0.91  0.89 - 1.76 ng/dL Final    WBC 72/53/6644 3.8  3.6 - 11.2 10*9/L Final    RBC 05/20/2023 4.07  3.95 - 5.13 10*12/L Final    HGB 05/20/2023 10.6 (L)  11.3 - 14.9 g/dL Final    HCT 03/47/4259 31.6 (L)  34.0 - 44.0 % Final    MCV 05/20/2023 77.8  77.6 - 95.7 fL Final    MCH 05/20/2023 26.0  25.9 - 32.4 pg Final    MCHC 05/20/2023 33.4  32.0 - 36.0 g/dL Final    RDW 56/38/7564 16.3 (H)  12.2 - 15.2 % Final    MPV 05/20/2023 7.4  6.8 - 10.7 fL Final    Platelet 05/20/2023 171  150 - 450 10*9/L Final    Neutrophils % 05/20/2023 58.0  % Final    Lymphocytes % 05/20/2023 30.4  % Final    Monocytes % 05/20/2023 11.0  % Final    Eosinophils % 05/20/2023 0.2  % Final    Basophils % 05/20/2023 0.4  % Final    Absolute Neutrophils 05/20/2023 2.2  1.8 - 7.8 10*9/L Final    Absolute Lymphocytes 05/20/2023 1.2  1.1 - 3.6 10*9/L Final    Absolute Monocytes 05/20/2023 0.4  0.3 - 0.8 10*9/L Final    Absolute Eosinophils 05/20/2023 0.0  0.0 - 0.5 10*9/L Final    Absolute Basophils 05/20/2023 0.0  0.0 - 0.1 10*9/L Final    Microcytosis 05/20/2023 Slight (A)  Not Present Final    Anisocytosis 05/20/2023 Slight (A)  Not Present Final    Hypochromasia 05/20/2023 Slight (A)  Not Present Final

## 2023-05-20 NOTE — Unmapped (Unsigned)
Port accessed.  Labs drawn & sent for analysis.  Patient sent to next appointment. Care provided By Nile Dear, RN

## 2023-05-25 DIAGNOSIS — Z419 Encounter for procedure for purposes other than remedying health state, unspecified: Secondary | ICD-10-CM | POA: Diagnosis not present

## 2023-05-27 DIAGNOSIS — F411 Generalized anxiety disorder: Secondary | ICD-10-CM | POA: Diagnosis not present

## 2023-05-29 NOTE — Unmapped (Signed)
Acquanetta Sit contacted the Communication Center regarding the following:    - Patient called regarding their short term disability form    Please contact Pierina at [phone].    Thanks in advance,    Christell Faith  [facility] Communication Center   [phone]

## 2023-05-30 DIAGNOSIS — F411 Generalized anxiety disorder: Secondary | ICD-10-CM | POA: Diagnosis not present

## 2023-06-02 DIAGNOSIS — F411 Generalized anxiety disorder: Secondary | ICD-10-CM | POA: Diagnosis not present

## 2023-06-03 NOTE — Unmapped (Signed)
[facility] Refill Coordination Note    Specialty Medication(s) to be Shipped:   Hematology/Oncology: Capecitabine 500mg , directions: Take 4 tablets (2,000mg  total) by mouth two (2) times a day  for 14 days on and 7 days off    Other medication(s) to be shipped: No additional medications requested for fill at this time     Erin Gordon, DOB: 1976-11-14  Phone: [phone] (home)       All above HIPAA information was verified with patient.     Was a Nurse, learning disability used for this call? No    Completed refill call assessment today to schedule patient's medication shipment from the [facility] [phone]).  All relevant notes have been reviewed.     Specialty medication(s) and dose(s) confirmed: Regimen is correct and unchanged.   Changes to medications: Erin Gordon reports no changes at this time.  Changes to insurance: No  New side effects reported not previously addressed with a pharmacist or physician: None reported  Questions for the pharmacist: No    Confirmed patient received a Conservation officer, historic buildings and a Surveyor, mining with first shipment. The patient will receive a drug information handout for each medication shipped and additional FDA Medication Guides as required.       DISEASE/MEDICATION-SPECIFIC INFORMATION        N/A    SPECIALTY MEDICATION ADHERENCE     Medication Adherence    Patient reported X missed doses in the last month: 0  Specialty Medication: capecitabine 500 MG tablet (XELODA)  Patient is on additional specialty medications: No  Informant: patient              Were doses missed due to medication being on hold? No    Capecitabine 500 mg: 0 days of medicine on hand       REFERRAL TO PHARMACIST     Referral to the pharmacist: Not needed      [facility]     Shipping address confirmed in Epic.       Delivery Scheduled: Yes, Expected medication delivery date: 06/10/23.     Medication will be delivered via Next Day Courier to the prescription address in Epic WAM.    Erin Gordon   [facility] Specialty Technician

## 2023-06-06 DIAGNOSIS — F411 Generalized anxiety disorder: Secondary | ICD-10-CM | POA: Diagnosis not present

## 2023-06-08 DIAGNOSIS — F411 Generalized anxiety disorder: Secondary | ICD-10-CM | POA: Diagnosis not present

## 2023-06-09 MED FILL — CAPECITABINE 500 MG TABLET: ORAL | 21 days supply | Qty: 112 | Fill #2

## 2023-06-10 DIAGNOSIS — F411 Generalized anxiety disorder: Secondary | ICD-10-CM | POA: Diagnosis not present

## 2023-06-11 NOTE — Unmapped (Signed)
Completed STD filled out and faxed on Wed 7/17 patient aware. EMR

## 2023-06-16 DIAGNOSIS — F411 Generalized anxiety disorder: Secondary | ICD-10-CM | POA: Diagnosis not present

## 2023-06-17 NOTE — Unmapped (Signed)
Outpatient Oncology Social Work   Initial Clinical Assessment        Referral: Patient called in on the Adult Oncology Shared Line    Patient Status: The patient is a 47 yo female with Malignant neoplasm of Left Breast. SW spoke to the patient via telephone. SW was asked if there are any foundations/grants that can help with utilities. SW did some research in the patient's chart to look for grants/foundations that she has recently received. SW submitted an application to [employer] to see if they will assist with the need. SW will continue to look for other grants/foundations that she could be eligible for.     Financial/Insurance: [facility] [place], [place]     Psychosocial/Coping: The patient has SW information and will call with any questions/concerns. SW will remain available to provide additional support, information, and resources as needed.          Domnique Vanegas H. Mayford Knife, MSW, LCSW  Oncology Outpatient Social Worker  [phone]

## 2023-06-18 DIAGNOSIS — F411 Generalized anxiety disorder: Secondary | ICD-10-CM | POA: Diagnosis not present

## 2023-06-21 DIAGNOSIS — F411 Generalized anxiety disorder: Secondary | ICD-10-CM | POA: Diagnosis not present

## 2023-06-24 NOTE — Unmapped (Signed)
[facility] Refill Coordination Note    Specialty Medication(s) to be Shipped:   Hematology/Oncology: Capecitabine 500mg , directions: Take 4 tablets (2,000mg  total) by mouth two (2) times a day  for 14 days on and 7 days off    Other medication(s) to be shipped: No additional medications requested for fill at this time     Erin Gordon, DOB: 1976-11-24  Phone: [phone] (home)       All above HIPAA information was verified with patient.     Was a Nurse, learning disability used for this call? No    Completed refill call assessment today to schedule patient's medication shipment from the [facility] [phone]).  All relevant notes have been reviewed.     Specialty medication(s) and dose(s) confirmed: Regimen is correct and unchanged.   Changes to medications: Janeli reports no changes at this time.  Changes to insurance: No  New side effects reported not previously addressed with a pharmacist or physician: None reported  Questions for the pharmacist: No    Confirmed patient received a Conservation officer, historic buildings and a Surveyor, mining with first shipment. The patient will receive a drug information handout for each medication shipped and additional FDA Medication Guides as required.       DISEASE/MEDICATION-SPECIFIC INFORMATION        N/A    SPECIALTY MEDICATION ADHERENCE     Medication Adherence    Patient reported X missed doses in the last month: 0  Specialty Medication: capecitabine 500 MG tablet (XELODA)  Patient is on additional specialty medications: No  Informant: patient              Were doses missed due to medication being on hold? No    Capecitabine 500 mg: 5-7 days of medicine on hand       REFERRAL TO PHARMACIST     Referral to the pharmacist: Not needed      [facility]     Shipping address confirmed in Epic.       Delivery Scheduled: Yes, Expected medication delivery date: 07/01/23.     Medication will be delivered via Next Day Courier to the prescription address in Epic WAM.    [name]   [facility] Specialty Technician

## 2023-06-25 DIAGNOSIS — Z419 Encounter for procedure for purposes other than remedying health state, unspecified: Secondary | ICD-10-CM | POA: Diagnosis not present

## 2023-06-27 DIAGNOSIS — F411 Generalized anxiety disorder: Secondary | ICD-10-CM | POA: Diagnosis not present

## 2023-06-30 MED FILL — CAPECITABINE 500 MG TABLET: ORAL | 21 days supply | Qty: 112 | Fill #3

## 2023-06-30 NOTE — Unmapped (Unsigned)
Breast Oncology Return Patient Evaluation  Referring Physician: Pcp, None Per Patient  [address]  [place],  [place] [address].  PCP: Erin Cooks, MD    Cancer Team  Surgical Oncology: Sherilyn Cooter, DO  Surgical Oncology NP: Pollyann Samples, NP and Sonda Primes, NP  Radiation Oncology: Rogelio Seen, MD  Medical Oncology: Adonis Brook, MD  Plastic Surgery: None at this time  Genetics: None at this time Blood drawn today.    Reason for Visit: A 47 y.o. female with breast cancer referred for consultation for recommendations concerning the management of breast cancer.    Cancer stage   Cancer Staging   Malignant neoplasm of left breast in female, estrogen receptor positive (CMS-HCC)  Staging form: Breast, AJCC 8th Edition  - Clinical stage from 03/11/2022: Stage IIIB (cT2, cN2(f), cM0, G3, ER+, PR-, HER2-) - Signed by Talbert Cage, DO on 03/12/2022  - Pathologic stage from 12/11/2022: No Stage Recommended (ypT3, pN2a, G3, ER+, PR-, HER2-) - Signed by Rudie Meyer, ANP on 12/11/2022      Erin Gordon is a 47 year old lady recently dx with left breast cancer.     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 She was randomized to no upfront RT boost.  She has had multiple delays in her chemotherapy treatment for various reasons. Including infection and cytopenias. She completed last dose of ddAC on 12/4.     She is s/p left breast lumpectomy and axillary LN dissection of axillary staging on 11/27/2022 with 9 of 28 lymph nodes positive for macro metastatic carcinoma with the size of the largest tumor deposit 33 mm, treatment effect identified in multiple lymph nodes, extensive lymphovascular space invasion, and extranodal extension present less than 2 mm.  Left breast partial mastectomy significant for invasive ductal carcinoma with tumor size 64 mm in greatest dimension ER+(2%), PR -, HER2- (IHC 1+). She has Residual Cancer Burden Class: RCB-III     Adjuvant systemic therapy:  -Previously discussed option of enrolling in the ASCENT-05/OptimICE-RD (AFT-65): Phase 3, randomized, open-label study of adjuvant sacituzumab govitecan + pembrolizumab (pembro) vs pembro ?? capecitabine in patients with triple-negative breast cancer and residual disease. After meeting with study coordinator she declines.   -We will proceed with SOC in the adjuvant setting for patient with TNBC with residual disease using Capecitabine while continuing Pembrolizumab every 6 weeks     Capecitabine 2000 mg po BID x 14 days on and 7 days off x 6 months. Started on 04/27/2023. *** She is cycle 2 day 3 of capecitabine today. She notes that she believes her hands may be changing color but denies peeling.        Staging Studies  - Complete staging with CT c/a/p, NM Bone scan; shows no evidence of distant metastasis . There was indeterminate left supraclavicular measuring up to 1 cm and subcentimeter right axillary lymph nodes measuring up to 0.5 cm; not considered suspicious and would not change management; monitor in follow up. We were unable to obtain a biospy of the 9 mm oval mass in the left breast that was seen on MRI of breast; patient declined MRI guided breast biospy.     -Repeat staging scans as above after surgery completed showed no evidence of distant metastasis  Cardiotoxic drug monitoring  -ECHO on 04/22/22 was WNL EF 60-65%      Fertility   -She reports hx of partial hysterectomy; reports she still has ovaries.   -She is sexual active; we reviewed chemotherapy precautions     Genetics:   - Genetic testing: STAT Panel plus ATM and CHEK2 sent to [facility] - Negative     Supportive Care  - HTN: patient taking hydrochlorothiazide. BP  elevated today; advised continue to follow up with [facility] family practice  -Tobacco use we discussed smoking cessation; she is interested. Referral placed.  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 She will resume but we will also send a referral for breast sleeve with [facility] and see if insurance will cover.    RTC:  Return to clinic in 6 weeks, labs, provider visit and infusion***    ----------------------------------------------------------------------------------------------------------------------------------------------    HPI: Erin Gordon is a 47 y.o. female who returns today for follow up and continuation of treatment for recently dx breast cancer .    Interval hx:   -Reports feeling well,   -She notes occasional headaches, which is new for her. She describes this as a frontal headache, that extends from temple to temple but responds to tylenol arthritis pill (650 mg tylenol in the pill). She notes a history of allergies and states she tolerated claritin well when previously on it so will try claritin again. No dizziness, no visual changes.   -She denies fever, chills and no pain, no cough or shortness of breath.   -She is currently a smoker but reports she has cut back significantly.      In summary:   [name] first noticed a knot in her upper outer left breast around 4-5 months ago (~ Dec 2022) She says she used to have boils on her breast when she was younger and says they would pop up, drain, and go away. She figured that this was a similar event, but became concerned when the lesion did not resolve.     She underwent diagnostic imaging on 02/12/22, which revealed a 3.2 x 1.8 x 2.4 cm irregular cystic and solid mass at the 6:00 5 CFN of the left breast. Echogenic foci within the mass likely correspond to pleomorphic calcifications seen on mammography. Additionally, there is a 1.0 x 0.5 x 0.7 cm hypoechoic circumscribed mass with internal vascularity at the 6:00 2 CFN of the left breast. Targeted ultrasound of the left axilla completed on 03/03/22 demonstrates multiple abnormally enlarged lymph nodes, largest demonstrating a cortical thickness of 1.9 cm. Lastly, in the right breast 9:00 retroareolar there is a 0.6 x 0.4 x  0.5 cm irregular hypoechoic mass. Subsequent biopsy of the 6 o'clock 5 CFN left breast lesion and axillary lesion revealed IDC, G3, ER+(2%), ER-, Her2-(1+). Biopsy of the 6 o'clock 2 CFN left breast lesion and right breast retroareolar lesion demonstrated benign breast tissue with stromal fibrosis and apocrine cysts.   She is now s/p Neoadjuvant chemotherapy and partial mastectomy; completing  adjuvant radiation next week.      Performance status= ECOG 0    Breast Cancer History  Hematology/Oncology History Overview  Note   2023: Left breast cT2 cN2 IDC, G3, weakly+2%/-/-     Malignant neoplasm of left breast in female, estrogen receptor positive (CMS-HCC)   02/12/2022 -  Presenting Symptoms    Self palpated left breast mass    MMG/US: Left breast 6:00 5 CFN there is a 3.2 x 1.8 x 2.4 cm irregular cystic and solid mass. Echogenic foci within the mass likely correspond to pleomorphic calcifications seen on mammography.  Left breast 6:00 2 CFN there is a 1.0 x 0.5 x 0.7 cm hypoechoic circumscribed mass with internal vascularity.  Left axilla there is multiple abnormally enlarged lymph nodes, largest demonstrating a cortical thickness of 1.9 cm.  Right breast 9:00 retroareolar there is  a 0.6 x 0.4 x  0.5 cm irregular hypoechoic mass.     03/03/2022 Biopsy    Left breast core biopsy, 6:00 5 CFN: IDC, G3, ER+(2%), ER-, Her2-(1+).    Left breast core biopsy, 6:00 2 CFN: benign breast tissue with stromal fibrosis and apocrine cysts.    Left axilla core biopsy: IDC, G3, ER+(2%), ER-, Her2-(1+).    Right breast core biopsy, retroareolar: benign breast tissue with stromal fibrosis and apocrine cysts.     03/11/2022 Initial Diagnosis    Malignant neoplasm of left breast in female, estrogen receptor positive (CMS-HCC)     03/11/2022 -  Cancer Staged    Staging form: Breast, AJCC 8th Edition  - Clinical stage from 03/11/2022: Stage IIIB (cT2, cN2(f), cM0, G3, ER+, PR-, HER2-) - Signed by Talbert Cage, DO on 03/12/2022       03/12/2022 Tumor Board    MDC recs: Left breast cT2 N1 IDC, G3, ER+(2%), PR-, HER2-  Imaging review with 3.2 cm area bx proven IDC and multiple (>7) enlarged axillary LNs. Additional left breast bx benign and concordant. Right breast bx benign and concordant.  NACT given locally advanced, schedule with med/onc add on next week  Staging studies  Possible PRAD trial candidate  Genetics: Meets GT criteria based on dx br cancer <50yo. Stat breast panel + ATM + CHEK2 and econsult     03/25/2022 Interval Scan(s)      MRI bilateral breast completed revealed 3.7 cm transverse by 3.3 cm AP by 2.6 cm craniocaudal. It demonstrates central necrosis with peripheral nodular enhancement. There is also an oval mass measuring 9 mm in the left breast, this mass has not been biopsied. However biopsied was recommended since patient is considering breast conservation surgery post chemotherapy. Patient was scheduled for biopsy of the 9 mm mass, but ultimately declined biopsy due to claustrophobia.     04/23/2022 -  Chemotherapy    STUDY [id number]-053 IRB# [id number] NEOADJUVANT PEMBROLIZUMAB +/- RT WITH PACLItaxel / CARBOplatin FOLLOWED BY ddAC (v. 05/16/21)  P-RAD: A Randomized Study of Preoperative Chemotherapy, Pembrolizumab and No, Low or High Dose RADiation in Node-Positive, HER2-Negative Breast Cancer     12/11/2022 -  Cancer Staged    Staging form: Breast, AJCC 8th Edition  - Pathologic stage from 12/11/2022: No Stage Recommended (ypT3, pN2a, G3, ER+, PR-, HER2-) - Signed by Rudie Meyer, ANP on 12/11/2022       Malignant neoplasm of left breast in female, estrogen receptor positive, unspecified site of breast (CMS-HCC)   03/25/2022 Interval Scan(s)      MRI bilateral breast completed revealed 3.7 cm transverse by 3.3 cm AP by 2.6 cm craniocaudal. It demonstrates central necrosis with peripheral nodular enhancement. There is also an oval mass measuring 9 mm in  the left breast, this mass has not been biopsied. However biopsied was recommended since patient is considering breast conservation surgery post chemotherapy. Patient was scheduled for biopsy of the 9 mm mass, but ultimately declined biopsy due to claustrophobia.     03/26/2022 Initial Diagnosis    Malignant neoplasm of left breast in female, estrogen receptor positive, unspecified site of breast (CMS-HCC)     03/26/2022 - 06/08/2022 Radiation    Radiation Therapy Treatment Details (03/26/2022 - 06/08/2022)  Site: Right Breast  Technique: IMRT  Goal: Curative  Planned Treatment Start Date: No planned start date specified     01/01/2023 -  Radiation    Radiation Therapy Treatment Details (Noted on 01/01/2023)  Site: Left Breast with lymph nodes  Technique: VMAT  Goal: No goal specified  Planned Treatment Start Date: No planned start date specified         Past Medical History  Past Medical History:   Diagnosis Date    Hypertension     Malignant neoplasm of left breast in female, estrogen receptor positive (CMS-HCC) 03/11/2022     HTN - stopped taking hydrochlorothiazide. Goes to [facility] family practice.    GERD - taking PRN PPI  Anxiety - not treated        Reproductive/GYN History  OB History   Gravida Para Term Preterm AB Living   4 4 4  0 0 0   SAB IAB Ectopic Molar Multiple Live Births   0 0 0 0 0 0      # Outcome Date GA Lbr Len/2nd Weight Sex Type Anes PTL Lv   4 Term            3 Term            2 Term            1 Term                Surgical History  Past Surgical History:   Procedure Laterality Date    BREAST BIOPSY      CHEMOTHERAPY      HYSTERECTOMY      IR INSERT PORT AGE GREATER THAN 5 YRS  04/23/2022    IR INSERT PORT AGE GREATER THAN 5 YRS 04/23/2022 Dorene Ar, PA IMG VIR [facility]BR    PR INTRAOPERATIVE SENTINEL LYMPH NODE ID W DYE INJECTION Left 11/27/2022    Procedure: INTRAOPERATIVE IDENTIFICATION SENTINEL LYMPH NODE(S) INCLUDE INJECTION NON-RADIOACTIVE DYE, WHEN PERFORMED;  Surgeon: Talbert Cage, DO;  Location: OR [facility];  Service: Surgical Oncology Breast    PR MASTECTOMY, PARTIAL Left 11/27/2022    Procedure: MASTECTOMY, PARTIAL (EG, LUMPECTOMY, TYLECTOMY, QUADRANTECTOMY, SEGMENTECTOMY);  Surgeon: Talbert Cage, DO;  Location: OR [facility];  Service: Surgical Oncology Breast    PR REMOVE ARMPITS LYMPH NODES COMPLT Left 11/27/2022    Procedure: AXILLARY LYMPHADENECTOMY; COMPLETE;  Surgeon: Talbert Cage, DO;  Location: OR [facility];  Service: Surgical Oncology Breast       Medications    Current Outpatient Medications:     albuterol HFA 90 mcg/actuation inhaler, Inhale 2 puffs every six (6) hours as needed for wheezing., Disp: 8 g, Rfl: 2    aspirin 325 MG tablet, Take 1 tablet (325 mg total) by mouth daily., Disp: , Rfl:     capecitabine (XELODA) 500 MG tablet, Take 4 tablets (2,000 mg total) by mouth two (2) times a day for 14 days on and 7 days off., Disp: 112 tablet, Rfl: 3  erythromycin (ROMYCIN) 5 mg/gram (0.5 %) ophthalmic ointment, Place a 1/2 inch ribbon of ointment into the lower eyelid. (Patient not taking: Reported on 02/18/2023), Disp: 3.5 g, Rfl: 0    hydrocortisone (ANUSOL-HC) 2.5 % rectal cream, Insert into the rectum two (2) times a day., Disp: 30 g, Rfl: 5    loratadine (CLARITIN) 10 mg tablet, Take 1 tablet (10 mg total) by mouth daily., Disp: 30 tablet, Rfl: 2    losartan (COZAAR) 50 MG tablet, Take 1 tablet (50 mg total) by mouth daily., Disp: 90 tablet, Rfl: 3    mometasone (ELOCON) 0.1 % cream, Apply to affected area daily, Disp: 90 g, Rfl: 1    omeprazole (PRILOSEC) 40 MG capsule, Take 1 capsule (40 mg total) by mouth daily., Disp: 30 capsule, Rfl: 3    oxybutynin (DITROPAN) 5 MG tablet, Take 0.5-1 tablets (2.5-5 mg total) by mouth Three (3) times a day as needed. For hot flashes, Disp: 90 tablet, Rfl: 11    silver sulfADIAZINE (SILVADENE, SSD) 1 % cream, Apply to affected area daily, Disp: 50 g, Rfl: 1    traMADol (ULTRAM) 50 mg tablet, Take 1 tablet (50 mg total) by mouth every six (6) hours as needed for pain., Disp: 30 tablet, Rfl: 0    Allergies  No Known Allergies    Personal and Social History  Social History     Social History Narrative    The patient is committed relationship.  She works as Scientist, research (medical). She has four children.   Lives in [place], [place] with her long-term boyfriend, Shon Baton, and two of her children. She has 4 children and now 5 grand children aged 42mo-104yrs. 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 Loves spending time at [employer]. Has been to the beach around 5 times the past year including [address], [address]     Smoking 1ppd x 10-15 years. Has quit before.  MJ smoking 1-4 joints per day  No cocaine, heroin, no IVDU  EtOH socially 2-3x weekly      Family History  Family History   Problem Relation Age of Onset    Diabetes Mother     Hypertension Mother     Parkinsonism Father     No Known Problems Sister     No Known Problems Daughter     No Known Problems Maternal Grandmother     No Known Problems Maternal Grandfather     Ovarian cancer Paternal Grandmother 44    Cancer Paternal Uncle         colon or prostate ?age    Breast cancer Paternal Cousin 46        recent dx, limited cntact    Breast cancer Other         3x paternal great aunts (PGF sisters) with breast ca ?ages; there may be other more distant paternal cousins w/ bresat ca    BRCA 1/2 Neg Hx     Colon cancer Neg Hx     Endometrial cancer Neg Hx    Parents still alive. Mother with DM. Father with parkinsons.   Uncle (paternal) cancer  Uncle (paternal) prostate  MGF colon cancer  Paternal aunt with breast cancer      Review of Systems: A 12-system review of systems was obtained including: Constitutional, Eyes, ENT, Cardiovascular, Respiratory, GI, GU, Musculoskeletal, Skin, Neurological, Psychiatric, Endocrine, Heme/Lymphatic, and Allergic/Immunologic systems. It is negative or non-contributory to the patient???s management except for the following: See Interval  History.    Physical Examination:  Vital Signs: There were no vitals taken for this visit.  General:  Healthy-appearing female in no acute distress..  Cardiovasc:  No heaves, regular, no additional sounds. No lower extremity edema.    Respiratory:  Chest clear to percussion and auscultation, unlabored. Implanted port located in upper right chest, site clean, dry no swelling or erythema.  Gastrointestinal:  Soft, nontender, no hepatomegaly.   Musculoskeletal:  No bony pain or tenderness.   Skin and Subcutaneous Tissues:  Negative. No rash, ecchymoses, purpuric lesions.  Psychiatric: Mood is normal. Appropriately anxious.  No other symptoms.   Neuro:  Alert and oriented.  Gait and coordination normal  Upper Extremity Lymphedema: None.   Breasts: hyperpigmented areas on left chest wall post RT. left axillary incision is well healed no swelling or erythema with stitch in placed. Left breast full compared to right c/w lymphedema.   GU: No obvious lesions, discharge, erythema or other discoloration, ingrown hairs or skin breakdown. 1 incredibly small area of dry skin on R buttock. No signs of HS. No external hemorrhoids. No tenderness to palpation.     I have personally reviewed the following diagnostic studies:  Lab Results   Component Value Date    WBC 3.8 05/20/2023    HGB 10.6 (L) 05/20/2023    HCT 31.6 (L) 05/20/2023    PLT 171 05/20/2023    CHOL 181 06/03/2021    TRIG 82 06/03/2021    HDL 43 06/03/2021    ALT 15 05/20/2023    AST 23 05/20/2023    NA 139 05/20/2023    K 4.0 05/20/2023    CL 108 (H) 05/20/2023    CREATININE 0.61 05/20/2023    BUN 11 05/20/2023    CO2 26.0 05/20/2023    TSH 1.054 05/20/2023    INR 0.94 05/07/2022

## 2023-07-07 NOTE — Unmapped (Signed)
Breast Oncology Return Patient Evaluation  Referring Physician: Pcp, None Per Patient  [address]  [place],  [place] [address].  PCP: Erin Cooks, MD    Cancer Team  Surgical Oncology: Erin Cooter, DO  Surgical Oncology NP: Pollyann Samples, NP and Erin Primes, NP  Radiation Oncology: Erin Seen, MD  Medical Oncology: Erin Brook, MD  Plastic Surgery: None at this time  Genetics: None at this time Blood drawn today.    Reason for Visit: A 47 y.o. female with breast cancer referred for consultation for recommendations concerning the management of breast cancer.    Cancer stage   Cancer Staging   Malignant neoplasm of left breast in female, estrogen receptor positive (CMS-HCC)  Staging form: Breast, AJCC 8th Edition  - Clinical stage from 03/11/2022: Stage IIIB (cT2, cN2(f), cM0, G3, ER+, PR-, HER2-) - Signed by Talbert Cage, DO on 03/12/2022  - Pathologic stage from 12/11/2022: No Stage Recommended (ypT3, pN2a, G3, ER+, PR-, HER2-) - Signed by Rudie Meyer, ANP on 12/11/2022      [name] is a 47 year old lady recently dx with left breast cancer.     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 She was randomized to no upfront RT boost.  She has had multiple delays in her chemotherapy treatment for various reasons. Including infection and cytopenias. She completed last dose of ddAC on 12/4.     She is s/p left breast lumpectomy and axillary LN dissection of axillary staging on 11/27/2022 with 9 of 28 lymph nodes positive for macro metastatic carcinoma with the size of the largest tumor deposit 33 mm, treatment effect identified in multiple lymph nodes, extensive lymphovascular space invasion, and extranodal extension present less than 2 mm.  Left breast partial mastectomy significant for invasive ductal carcinoma with tumor size 64 mm in greatest dimension ER+(2%), PR -, HER2- (IHC 1+). She has Residual Cancer Burden Class: RCB-III     Adjuvant systemic therapy:  -Previously discussed option of enrolling in the ASCENT-05/OptimICE-RD (AFT-65): Phase 3, randomized, open-label study of adjuvant sacituzumab govitecan + pembrolizumab (pembro) vs pembro ?? capecitabine in patients with triple-negative breast cancer and residual disease. After meeting with study coordinator she declines.   -We will proceed with SOC in the adjuvant setting for patient with TNBC with residual disease using Capecitabine while continuing Pembrolizumab every 6 weeks     Capecitabine 2000 mg po BID x 14 days on and 7 days off x 6 months. Started on 04/27/2023.  She is on cycle 4 day 18 of capecitabine today per her report.  She notes darkening on the palm of the hands and soles of the feel. She denies pain or skin peeling.     Staging Studies  - Complete staging with CT c/a/p, NM Bone scan; shows no evidence of distant metastasis . There was indeterminate left supraclavicular measuring up to 1 cm and subcentimeter right axillary lymph nodes measuring up to 0.5 cm; not considered suspicious and would not change management; monitor in follow up. We were unable to obtain a biospy of the 9 mm oval mass in the left breast that was Gordon on MRI of breast; patient declined MRI guided breast biospy.   -Repeat staging scans as above after surgery completed showed no evidence  of distant metastasis.    Cardiotoxic drug monitoring  -ECHO on 04/22/22 was WNL EF 60-65%    Fertility   -She reports hx of partial hysterectomy; reports she still has ovaries.   -She is sexual active; we reviewed chemotherapy precautions     Genetics:   - Genetic testing: STAT Panel plus ATM and CHEK2 sent to [facility] - Negative     Supportive Care  - HTN: patient taking hydrochlorothiazide. BP  elevated today; advised continue to follow up with [facility] family practice  -Tobacco use we discussed smoking cessation; she is interested. Referral placed.  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 We sent a referral for breast sleeve with [facility] and see if insurance will cover.    RTC:  6 weeks, labs and infusion   Return to clinic in 12 weeks, labs, provider visit and infusion    ---------------------------------------------------------------------------------------------------------------------------------------------    HPI: Erin Gordon is a 47 y.o. female who returns today for follow up and continuation of treatment for recently dx breast cancer .    Interval hx:   In general she is feeling well, she is taking capecitabine as prescribed and tolerating well.   She reports occasional diarrhea, denies abdominal cramping, no nausea and no vomiting.   She denies blood in stool and no dark stools  She is eating better, wt is stable.   She reports occasional headache, sometimes blurred vision occurring when driving at night and watching TV for a long time. She otherwise denies any visional loss or pain.   She reports pain in the right knee, was needing surgery prior to cancer diagnosis. She request ortho referral.  She denies fever, chills and no pain, no cough or shortness of breath.   She is currently a smoker but reports she has cut back significantly.      Regarding lymphedema   patient has tried and failed four weeks of conservative treatment including compression, elevation, & exercise.    Patient tried and failed a one time basic pump trial due to increased swelling in chest post demo.     In summary:   [name] first noticed a knot in her upper outer left breast around 4-5 months ago (~ Dec 2022) She says she used to have boils on her breast when she was younger and says they would pop up, drain, and go away. She figured that this was a similar event, but became concerned when the lesion did not resolve.     She underwent diagnostic imaging on 02/12/22, which revealed a 3.2 x 1.8 x 2.4 cm irregular cystic and solid mass at the 6:00 5 CFN of the left breast. Echogenic foci within the mass likely correspond to pleomorphic calcifications Gordon on mammography. Additionally, there is a 1.0 x 0.5 x 0.7 cm hypoechoic circumscribed mass with internal vascularity at the 6:00 2 CFN of the left breast. Targeted ultrasound of the left axilla completed on 03/03/22 demonstrates multiple abnormally enlarged lymph nodes, largest demonstrating a cortical thickness of 1.9 cm. Lastly, in the right breast 9:00 retroareolar there is a 0.6 x 0.4 x  0.5 cm irregular hypoechoic mass. Subsequent biopsy  of the 6 o'clock 5 CFN left breast lesion and axillary lesion revealed IDC, G3, ER+(2%), ER-, Her2-(1+). Biopsy of the 6 o'clock 2 CFN left breast lesion and right breast retroareolar lesion demonstrated benign breast tissue with stromal fibrosis and apocrine cysts.   She is now s/p Neoadjuvant chemotherapy and partial mastectomy; completing  adjuvant radiation next week.      Performance status= ECOG 0    Breast Cancer History  Hematology/Oncology History Overview Note   2023: Left breast cT2 cN2 IDC, G3, weakly+2%/-/-     Malignant neoplasm of left breast in female, estrogen receptor positive (CMS-HCC)   02/12/2022 -  Presenting Symptoms    Self palpated left breast mass    MMG/US: Left breast 6:00 5 CFN there is a 3.2 x 1.8 x 2.4 cm irregular cystic and solid mass. Echogenic foci within the mass likely correspond to pleomorphic calcifications Gordon on mammography.  Left breast 6:00 2 CFN there is a 1.0 x 0.5 x 0.7 cm hypoechoic circumscribed mass with internal vascularity.  Left axilla there is multiple abnormally enlarged lymph nodes, largest demonstrating a cortical thickness of 1.9 cm.  Right breast 9:00 retroareolar there is  a 0.6 x 0.4 x  0.5 cm irregular hypoechoic mass.     03/03/2022 Biopsy    Left breast core biopsy, 6:00 5 CFN: IDC, G3, ER+(2%), ER-, Her2-(1+).    Left breast core biopsy, 6:00 2 CFN: benign breast tissue with stromal fibrosis and apocrine cysts.    Left axilla core biopsy: IDC, G3, ER+(2%), ER-, Her2-(1+).    Right breast core biopsy, retroareolar: benign breast tissue with stromal fibrosis and apocrine cysts.     03/11/2022 Initial Diagnosis    Malignant neoplasm of left breast in female, estrogen receptor positive (CMS-HCC)     03/11/2022 -  Cancer Staged    Staging form: Breast, AJCC 8th Edition  - Clinical stage from 03/11/2022: Stage IIIB (cT2, cN2(f), cM0, G3, ER+, PR-, HER2-) - Signed by Talbert Cage, DO on 03/12/2022       03/12/2022 Tumor Board    MDC recs: Left breast cT2 N1 IDC, G3, ER+(2%), PR-, HER2-  Imaging review with 3.2 cm area bx proven IDC and multiple (>7) enlarged axillary LNs. Additional left breast bx benign and concordant. Right breast bx benign and concordant.  NACT given locally advanced, schedule with med/onc add on next week  Staging studies  Possible PRAD trial candidate  Genetics: Meets GT criteria based on dx br cancer <50yo. Stat breast panel + ATM + CHEK2 and econsult     03/25/2022 Interval Scan(s)      MRI bilateral breast completed revealed 3.7 cm transverse by 3.3 cm AP by 2.6 cm craniocaudal. It demonstrates central necrosis with peripheral nodular enhancement. There is also an oval mass measuring 9 mm in the left breast, this mass has not been biopsied. However biopsied was recommended since patient is considering breast conservation surgery post chemotherapy. Patient was scheduled for biopsy of the 9 mm mass, but ultimately declined biopsy due to claustrophobia.     04/23/2022 -  Chemotherapy    STUDY TBCRC-053 IRB# [id number] NEOADJUVANT PEMBROLIZUMAB +/- RT WITH PACLItaxel / CARBOplatin FOLLOWED BY ddAC (v. 05/16/21)  P-RAD: A Randomized Study of Preoperative Chemotherapy, Pembrolizumab and No, Low or High Dose RADiation in Node-Positive, HER2-Negative Breast Cancer     12/11/2022 -  Cancer Staged    Staging form: Breast, AJCC 8th Edition  - Pathologic stage from 12/11/2022: No Stage Recommended (ypT3,  pN2a, G3, ER+, PR-, HER2-) - Signed by Rudie Meyer, ANP on 12/11/2022       Malignant neoplasm of left breast in female, estrogen receptor positive, unspecified site of breast (CMS-HCC)   03/25/2022 Interval Scan(s)      MRI bilateral breast completed revealed 3.7 cm transverse by 3.3 cm AP by 2.6 cm craniocaudal. It demonstrates central necrosis with peripheral nodular enhancement. There is also an oval mass measuring 9 mm in the left breast, this mass has not been biopsied. However biopsied was recommended since patient is considering breast conservation surgery post chemotherapy. Patient was scheduled for biopsy of the 9 mm mass, but ultimately declined biopsy due to claustrophobia.     03/26/2022 Initial Diagnosis    Malignant neoplasm of left breast in female, estrogen receptor positive, unspecified site of breast (CMS-HCC)     03/26/2022 - 06/08/2022 Radiation    Radiation Therapy Treatment Details (03/26/2022 - 06/08/2022)  Site: Right Breast  Technique: IMRT  Goal: Curative  Planned Treatment Start Date: No planned start date specified     01/01/2023 -  Radiation    Radiation Therapy Treatment Details (Noted on 01/01/2023)  Site: Left Breast with lymph nodes  Technique: VMAT  Goal: No goal specified  Planned Treatment Start Date: No planned start date specified         Past Medical History  Past Medical History:   Diagnosis Date    Hypertension     Malignant neoplasm of left breast in female, estrogen receptor positive (CMS-HCC) 03/11/2022     HTN - stopped taking hydrochlorothiazide. Goes to [facility] family practice.    GERD - taking PRN PPI  Anxiety - not treated        Reproductive/GYN History  OB History   Gravida Para Term Preterm AB Living   4 4 4  0 0 0   SAB IAB Ectopic Molar Multiple Live Births   0 0 0 0 0 0      # Outcome Date GA Lbr Len/2nd Weight Sex Type Anes PTL Lv   4 Term            3 Term            2 Term            1 Term                Surgical History  Past Surgical History:   Procedure Laterality Date    BREAST BIOPSY      CHEMOTHERAPY      HYSTERECTOMY      IR INSERT PORT AGE GREATER THAN 5 YRS  04/23/2022    IR INSERT PORT AGE GREATER THAN 5 YRS 04/23/2022 Dorene Ar, PA IMG VIR [facility]    PR INTRAOPERATIVE SENTINEL LYMPH NODE ID W DYE INJECTION Left 11/27/2022    Procedure: INTRAOPERATIVE IDENTIFICATION SENTINEL LYMPH NODE(S) INCLUDE INJECTION NON-RADIOACTIVE DYE, WHEN PERFORMED;  Surgeon: Talbert Cage, DO;  Location: OR [facility];  Service: Surgical Oncology Breast    PR MASTECTOMY, PARTIAL Left 11/27/2022    Procedure: MASTECTOMY, PARTIAL (EG, LUMPECTOMY, TYLECTOMY, QUADRANTECTOMY, SEGMENTECTOMY);  Surgeon: Talbert Cage, DO;  Location: OR [facility];  Service: Surgical Oncology Breast    PR REMOVE ARMPITS LYMPH NODES COMPLT Left 11/27/2022    Procedure: AXILLARY LYMPHADENECTOMY; COMPLETE;  Surgeon: Talbert Cage, DO;  Location: OR [facility];  Service: Surgical Oncology Breast       Medications    Current Outpatient Medications:  albuterol HFA 90 mcg/actuation inhaler, Inhale 2 puffs every six (6) hours as needed for wheezing., Disp: 8 g, Rfl: 2    aspirin 325 MG tablet, Take 1 tablet (325 mg total) by mouth daily., Disp: , Rfl:     capecitabine (XELODA) 500 MG tablet, Take 4 tablets (2,000 mg total) by mouth two (2) times a day for 14 days on and 7 days off., Disp: 112 tablet, Rfl: 3    erythromycin (ROMYCIN) 5 mg/gram (0.5 %) ophthalmic ointment, Place a 1/2 inch ribbon of ointment into the lower eyelid. (Patient not taking: Reported on 02/18/2023), Disp: 3.5 g, Rfl: 0    hydrocortisone (ANUSOL-HC) 2.5 % rectal cream, Insert into the rectum two (2) times a day., Disp: 30 g, Rfl: 5    loratadine (CLARITIN) 10 mg tablet, Take 1 tablet (10 mg total) by mouth daily., Disp: 30 tablet, Rfl: 2    losartan (COZAAR) 50 MG tablet, Take 1 tablet (50 mg total) by mouth daily., Disp: 90 tablet, Rfl: 3    mometasone (ELOCON) 0.1 % cream, Apply to affected area daily, Disp: 90 g, Rfl: 1    omeprazole (PRILOSEC) 40 MG capsule, Take 1 capsule (40 mg total) by mouth daily., Disp: 30 capsule, Rfl: 3    oxybutynin (DITROPAN) 5 MG tablet, Take 0.5-1 tablets (2.5-5 mg total) by mouth Three (3) times a day as needed. For hot flashes, Disp: 90 tablet, Rfl: 11    silver sulfADIAZINE (SILVADENE, SSD) 1 % cream, Apply to affected area daily, Disp: 50 g, Rfl: 1    traMADol (ULTRAM) 50 mg tablet, Take 1 tablet (50 mg total) by mouth every six (6) hours as needed for pain., Disp: 30 tablet, Rfl: 0    Allergies  No Known Allergies    Personal and Social History  Social History     Social History Narrative    The patient is committed relationship.  She works as Scientist, research (medical). She has four children.   Lives in [place], [place] with her long-term boyfriend, Shon Baton, and two of her children. She has 4 children and now 5 grand children aged 98mo-29yrs. 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 Loves spending time at [employer]. Has been to the beach around 5 times the past year including [address], [address]     Smoking 1ppd x 10-15 years. Has quit before.  MJ smoking 1-4 joints per day  No cocaine, heroin, no IVDU  EtOH socially 2-3x weekly      Family History  Family History   Problem Relation Age of Onset    Diabetes Mother     Hypertension Mother     Parkinsonism Father     No Known Problems Sister     No Known Problems Daughter     No Known Problems Maternal Grandmother     No Known Problems Maternal Grandfather Ovarian cancer Paternal Grandmother 17    Cancer Paternal Uncle         colon or prostate ?age    Breast cancer Paternal Cousin 89        recent dx, limited cntact    Breast cancer Other         3x paternal great aunts (PGF sisters) with breast ca ?ages; there may be other more distant paternal cousins w/ bresat ca    BRCA 1/2 Neg Hx     Colon cancer Neg Hx     Endometrial cancer Neg Hx    Parents  still alive. Mother with DM. Father with parkinsons.   Uncle (paternal) cancer  Uncle (paternal) prostate  MGF colon cancer  Paternal aunt with breast cancer      Review of Systems: A 12-system review of systems was obtained including: Constitutional, Eyes, ENT, Cardiovascular, Respiratory, GI, GU, Musculoskeletal, Skin, Neurological, Psychiatric, Endocrine, Heme/Lymphatic, and Allergic/Immunologic systems. It is negative or non-contributory to the patient???s management except for the following: See Interval History.    Physical Examination:  Vital Signs: BP 127/85  - Pulse 77  - Temp 36.6 ??C (97.8 ??F) (Oral)  - Ht 162.6 cm (5' 4)  - Wt 87.2 kg (192 lb 4.8 oz)  - BMI 33.01 kg/m??   General:  Healthy-appearing female in no acute distress..  Cardiovasc:  No heaves, regular, no additional sounds. No lower extremity edema.    Respiratory:  Chest clear to percussion and auscultation, unlabored. Implanted port located in upper right chest, site clean, dry no swelling or erythema.  Gastrointestinal:  Soft, nontender, no hepatomegaly.   Musculoskeletal:  No bony pain or tenderness.   Skin and Subcutaneous Tissues:  Negative. No rash, ecchymoses, purpuric lesions.  Psychiatric: Mood is normal. Appropriately anxious.  No other symptoms.   Neuro:  Alert and oriented.  Gait and coordination normal  Upper Extremity Lymphedema: None.   Breasts: hyperpigmented areas on left chest wall post RT. left axillary incision is well healed no swelling or erythema with stitch in placed. Left breast full compared to right c/w lymphedema.   GU: No obvious lesions, discharge, erythema or other discoloration, ingrown hairs or skin breakdown. 1 incredibly small area of dry skin on R buttock. No signs of HS. No external hemorrhoids. No tenderness to palpation.     I have personally reviewed the following diagnostic studies:  Lab Results   Component Value Date    WBC 3.8 05/20/2023    HGB 10.6 (L) 05/20/2023    HCT 31.6 (L) 05/20/2023    PLT 171 05/20/2023    CHOL 181 06/03/2021    TRIG 82 06/03/2021    HDL 43 06/03/2021    ALT 15 05/20/2023    AST 23 05/20/2023    NA 139 05/20/2023    K 4.0 05/20/2023    CL 108 (H) 05/20/2023    CREATININE 0.61 05/20/2023    BUN 11 05/20/2023    CO2 26.0 05/20/2023    TSH 1.054 05/20/2023    INR 0.94 05/07/2022

## 2023-07-08 ENCOUNTER — Other Ambulatory Visit: Admit: 2023-07-08 | Discharge: 2023-07-09 | Payer: PRIVATE HEALTH INSURANCE

## 2023-07-08 ENCOUNTER — Ambulatory Visit: Admit: 2023-07-08 | Discharge: 2023-07-09 | Payer: PRIVATE HEALTH INSURANCE

## 2023-07-08 DIAGNOSIS — C773 Secondary and unspecified malignant neoplasm of axilla and upper limb lymph nodes: Secondary | ICD-10-CM | POA: Diagnosis not present

## 2023-07-08 DIAGNOSIS — F17210 Nicotine dependence, cigarettes, uncomplicated: Secondary | ICD-10-CM | POA: Diagnosis not present

## 2023-07-08 DIAGNOSIS — I972 Postmastectomy lymphedema syndrome: Secondary | ICD-10-CM | POA: Diagnosis not present

## 2023-07-08 DIAGNOSIS — R519 Headache, unspecified: Secondary | ICD-10-CM | POA: Diagnosis not present

## 2023-07-08 DIAGNOSIS — M25561 Pain in right knee: Secondary | ICD-10-CM | POA: Diagnosis not present

## 2023-07-08 DIAGNOSIS — Z800 Family history of malignant neoplasm of digestive organs: Secondary | ICD-10-CM | POA: Diagnosis not present

## 2023-07-08 DIAGNOSIS — Z5112 Encounter for antineoplastic immunotherapy: Secondary | ICD-10-CM | POA: Diagnosis not present

## 2023-07-08 DIAGNOSIS — I10 Essential (primary) hypertension: Secondary | ICD-10-CM | POA: Diagnosis not present

## 2023-07-08 DIAGNOSIS — G8929 Other chronic pain: Secondary | ICD-10-CM | POA: Diagnosis not present

## 2023-07-08 DIAGNOSIS — C50912 Malignant neoplasm of unspecified site of left female breast: Secondary | ICD-10-CM | POA: Diagnosis not present

## 2023-07-08 DIAGNOSIS — Z7982 Long term (current) use of aspirin: Secondary | ICD-10-CM | POA: Diagnosis not present

## 2023-07-08 DIAGNOSIS — G620 Drug-induced polyneuropathy: Secondary | ICD-10-CM | POA: Diagnosis not present

## 2023-07-08 DIAGNOSIS — Z170 Estrogen receptor positive status [ER+]: Secondary | ICD-10-CM | POA: Diagnosis not present

## 2023-07-08 DIAGNOSIS — T451X5D Adverse effect of antineoplastic and immunosuppressive drugs, subsequent encounter: Secondary | ICD-10-CM | POA: Diagnosis not present

## 2023-07-08 DIAGNOSIS — M25562 Pain in left knee: Secondary | ICD-10-CM | POA: Diagnosis not present

## 2023-07-08 LAB — CBC W/ AUTO DIFF
BASOPHILS ABSOLUTE COUNT: 0 10*9/L (ref 0.0–0.1)
BASOPHILS RELATIVE PERCENT: 0.3 %
EOSINOPHILS ABSOLUTE COUNT: 0 10*9/L (ref 0.0–0.5)
EOSINOPHILS RELATIVE PERCENT: 0.6 %
HEMATOCRIT: 35.1 % (ref 34.0–44.0)
HEMOGLOBIN: 11.4 g/dL (ref 11.3–14.9)
LYMPHOCYTES ABSOLUTE COUNT: 0.9 10*9/L — ABNORMAL LOW (ref 1.1–3.6)
LYMPHOCYTES RELATIVE PERCENT: 27.4 %
MEAN CORPUSCULAR HEMOGLOBIN CONC: 32.5 g/dL (ref 32.0–36.0)
MEAN CORPUSCULAR HEMOGLOBIN: 26.3 pg (ref 25.9–32.4)
MEAN CORPUSCULAR VOLUME: 80.8 fL (ref 77.6–95.7)
MEAN PLATELET VOLUME: 7.8 fL (ref 6.8–10.7)
MONOCYTES ABSOLUTE COUNT: 0.4 10*9/L (ref 0.3–0.8)
MONOCYTES RELATIVE PERCENT: 11 %
NEUTROPHILS ABSOLUTE COUNT: 2 10*9/L (ref 1.8–7.8)
NEUTROPHILS RELATIVE PERCENT: 60.7 %
PLATELET COUNT: 163 10*9/L (ref 150–450)
RED BLOOD CELL COUNT: 4.34 10*12/L (ref 3.95–5.13)
RED CELL DISTRIBUTION WIDTH: 16.9 % — ABNORMAL HIGH (ref 12.2–15.2)
WBC ADJUSTED: 3.3 10*9/L — ABNORMAL LOW (ref 3.6–11.2)

## 2023-07-08 LAB — COMPREHENSIVE METABOLIC PANEL
ALBUMIN: 3.5 g/dL (ref 3.4–5.0)
ALKALINE PHOSPHATASE: 75 U/L (ref 46–116)
ALT (SGPT): 29 U/L (ref 10–49)
ANION GAP: 4 mmol/L — ABNORMAL LOW (ref 5–14)
AST (SGOT): 39 U/L — ABNORMAL HIGH (ref <=34)
BILIRUBIN TOTAL: 0.2 mg/dL — ABNORMAL LOW (ref 0.3–1.2)
BLOOD UREA NITROGEN: 12 mg/dL (ref 9–23)
BUN / CREAT RATIO: 21
CALCIUM: 9.2 mg/dL (ref 8.7–10.4)
CHLORIDE: 108 mmol/L — ABNORMAL HIGH (ref 98–107)
CO2: 28 mmol/L (ref 20.0–31.0)
CREATININE: 0.58 mg/dL
EGFR CKD-EPI (2021) FEMALE: >90 mL/min/{1.73_m2} (ref >=60)
GLUCOSE RANDOM: 89 mg/dL (ref 70–179)
POTASSIUM: 4.1 mmol/L (ref 3.4–4.8)
PROTEIN TOTAL: 7.2 g/dL (ref 5.7–8.2)
SODIUM: 140 mmol/L (ref 135–145)

## 2023-07-08 LAB — T4, FREE: FREE T4: 1.12 ng/dL (ref 0.89–1.76)

## 2023-07-08 LAB — TSH: THYROID STIMULATING HORMONE: 1.035 u[IU]/mL (ref 0.550–4.780)

## 2023-07-08 MED ADMIN — sodium chloride (NS) 0.9 % infusion: 100 mL/h | INTRAVENOUS | @ 21:00:00

## 2023-07-08 MED ADMIN — pembrolizumab (KEYTRUDA) 400 mg in sodium chloride (NS) 0.9 % 50 mL IVPB: 400 mg | INTRAVENOUS | @ 21:00:00 | Stop: 2023-07-08

## 2023-07-09 NOTE — Unmapped (Signed)
Patient arrived to chair 5.  No complaints noted.  Access of port intact with blood return.  Patient completed and tolerated treatment.  AVS declined and patient discharged to home.

## 2023-07-10 DIAGNOSIS — F411 Generalized anxiety disorder: Secondary | ICD-10-CM | POA: Diagnosis not present

## 2023-07-11 DIAGNOSIS — F411 Generalized anxiety disorder: Secondary | ICD-10-CM | POA: Diagnosis not present

## 2023-07-16 DIAGNOSIS — Z170 Estrogen receptor positive status [ER+]: Principal | ICD-10-CM

## 2023-07-16 DIAGNOSIS — C50912 Malignant neoplasm of unspecified site of left female breast: Principal | ICD-10-CM

## 2023-07-16 MED ORDER — CAPECITABINE 500 MG TABLET
ORAL_TABLET | Freq: Two times a day (BID) | ORAL | 3 refills | 14 days | Status: CP
Start: 2023-07-16 — End: ?
  Filled 2023-07-21: qty 112, 21d supply, fill #0

## 2023-07-18 DIAGNOSIS — F411 Generalized anxiety disorder: Secondary | ICD-10-CM | POA: Diagnosis not present

## 2023-07-20 NOTE — Unmapped (Signed)
[facility] Refill Coordination Note    Specialty Medication(s) to be Shipped:   Hematology/Oncology: Capecitabine 500mg , directions: Take 4 tablets (2,000mg  total) by mouth two (2) times a day  for 14 days on and 7 days off    Other medication(s) to be shipped: No additional medications requested for fill at this time     Erin Gordon, DOB: 1976/03/16  Phone: [phone] (home)       All above HIPAA information was verified with patient.     Was a Nurse, learning disability used for this call? No    Completed refill call assessment today to schedule patient's medication shipment from the [facility] [phone]).  All relevant notes have been reviewed.     Specialty medication(s) and dose(s) confirmed: Regimen is correct and unchanged.   Changes to medications: Erin Gordon reports no changes at this time.  Changes to insurance: No  New side effects reported not previously addressed with a pharmacist or physician: None reported  Questions for the pharmacist: No    Confirmed patient received a Conservation officer, historic buildings and a Surveyor, mining with first shipment. The patient will receive a drug information handout for each medication shipped and additional FDA Medication Guides as required.       DISEASE/MEDICATION-SPECIFIC INFORMATION        N/A    SPECIALTY MEDICATION ADHERENCE     Medication Adherence    Patient reported X missed doses in the last month: 0  Specialty Medication: capecitabine 500 MG tablet (XELODA)  Patient is on additional specialty medications: No  Informant: patient              Were doses missed due to medication being on hold? No    Capecitabine 500 mg: 5 days of medicine on hand       REFERRAL TO PHARMACIST     Referral to the pharmacist: Not needed      [facility]     Shipping address confirmed in Epic.       Delivery Scheduled: Yes, Expected medication delivery date: 07/21/23.     Medication will be delivered via Same Day Courier to the prescription address in Epic WAM.    Erin Gordon   [facility] Specialty Technician

## 2023-07-22 ENCOUNTER — Ambulatory Visit
Admit: 2023-07-22 | Discharge: 2023-07-23 | Payer: PRIVATE HEALTH INSURANCE | Attending: Physician Assistant | Primary: Physician Assistant

## 2023-07-22 ENCOUNTER — Ambulatory Visit: Admit: 2023-07-22 | Discharge: 2023-07-23 | Payer: PRIVATE HEALTH INSURANCE

## 2023-07-22 DIAGNOSIS — C50912 Malignant neoplasm of unspecified site of left female breast: Secondary | ICD-10-CM | POA: Diagnosis not present

## 2023-07-22 DIAGNOSIS — G8929 Other chronic pain: Secondary | ICD-10-CM | POA: Diagnosis not present

## 2023-07-22 DIAGNOSIS — Z170 Estrogen receptor positive status [ER+]: Secondary | ICD-10-CM | POA: Diagnosis not present

## 2023-07-22 DIAGNOSIS — M170 Bilateral primary osteoarthritis of knee: Secondary | ICD-10-CM | POA: Diagnosis not present

## 2023-07-22 DIAGNOSIS — M25562 Pain in left knee: Secondary | ICD-10-CM | POA: Diagnosis not present

## 2023-07-22 NOTE — Unmapped (Signed)
Thank-you for coming to [facility]      Recommendations/Plan  Medications: Take over-the-counter medications as needed and as tolerated  Take medications as precribed by your Medical Team  Follow-up: Continue working on smoking cessation.  Once she feel that you have been successful with this, please contact me    Contact my clinical staff at [phone] for any questions/concerns. Voicemail is checked Monday-Friday from 8:00 am- 4:00 pm    To schedule an appointment with Alicia Ackert, PA-C at [facility] please call: [phone]    [facility], our orthopedic urgent care at [facility], is open Monday-Friday 8:00 am - 4:00 pm with no appointment necessary.     MyChart messages: These messages can be sent to your provider and will be checked by their clinical support staff. The messages are checked throughout the day during normal business hours from 8:30 am-4:00 pm Monday-Friday, however responses may take up to 48 hours. Please use this method of communication for non-urgent and non-emergent concerns, questions, refill requests or inquiries only. Our team will help respond to all of your questions. Please note that you may be asked to see a provider by either a telehealth or in person visit if it is deemed your questions are best handled in the clinic setting in person.    Please keep in mind, these messages are not real time communications, so be patient when waiting for a response.     If you have an issue that requires emergent attention that cannot wait; either call the Orthopaedics resident on call at [phone], consider coming to our [facility] walk-in clinic, or go to the nearest Emergency Department.     We look forward to seeing you again in the future and appreciate you choosing [place] for your care!

## 2023-07-24 NOTE — Unmapped (Signed)
[facility] CLINIC NOTE       Erin Gordon, [place]  [phone]        Patient Name: Erin Gordon  MRN: [id number]  DOB: December 24, 1975    Date of Evaluation: 07/22/2023    PCP: Jeanie Cooks, MD      ASSESSMENT:     Erin Gordon is a 46 y.o. female with the following diagnosis:      ICD-10-CM   1. Bilateral primary osteoarthritis of knee  M17.0   2. Malignant neoplasm of left breast in female, estrogen receptor positive, unspecified site of breast (CMS-HCC)  C50.912    Z17.0   3. Chronic pain of left knee  M25.562    G89.29         MEDICAL DECISION MAKING (level of service defined by 2/3 elements)     Number/Complexity of Problems Addressed 1 stable chronic illness (99203/99213)   Amount/Complexity of Data to be Reviewed/Analyzed 2 points: Review prior notes (1 point per unique source); Review test results (1 point per unique test); Order tests (1 point per unique test) (99203/99213)   Risk of Complications/Morbidity/Mortality of Management Over-the-counter Medications (99203/99213)         PLAN:   The diagnosis and treatment plan were discussed.  We discussed that patient is known to have severe osteoarthritis of the right knee and moderate osteoarthritis of the left knee.  Her knee pain is getting worse and she is hoping to proceed with TKA.  She is currently still smoking and understands that she will need to be successful with smoking cessation before she can proceed with TKA.  In addition she will need clearances including with her oncology team before proceeding with TKA.  She will contact me when she has been successful with smoking cessation  - Follow-up plan: No follow-ups on file..      I reviewed the diagnosis and discussed treatment options with the patient. The patient is amenable to the above plan. The patient was instructed to call or return to clinic if symptoms fail to improve as expected, there is any increasing pain, or any other concerns.      Reason for Visit:  Follow-up bilateral knees    History of present illness: Erin Gordon returns today for evaluation of both of her knees.  She is known to have osteoarthritis.  I saw her in November.  We discussed TKA but at that time she was still smoking.  Since that time she was diagnosed with breast cancer and is currently undergoing treatment.  She is hoping to proceed with TKA as her knee pain is getting worse.  She denies new injury to either knee.  She continues to note greater pain in the right knee than the left.    ROS: Pertinent positives and negatives are documented in the HPI. All other systems reviewed are negative. Patient was instructed to follow-up with the appropriate provider as necessary for all pertinent positives not related to today's encounter.    Physical Exam: Gen.: DEBORHA SI is a well appearing 47 y.o.  female in no apparent distress.  She ambulates with normal gait.  Exam of the right knee demonstrates valgus deformity.  Mild valgus deformity of the left knee.  Mild diffuse swelling of the right knee.  Palpable lateral joint line tenderness.  Range of motion of the right knee 5 to 110 degrees of flexion, range of motion of the left knee 5 to 120 degrees of flexion.. The feet are  is warm and well perfused. There is good capillary refill. Distal pulses are equal bilaterally.        Imaging:    No new images were obtained today

## 2023-07-26 DIAGNOSIS — Z419 Encounter for procedure for purposes other than remedying health state, unspecified: Secondary | ICD-10-CM | POA: Diagnosis not present

## 2023-07-27 DIAGNOSIS — F411 Generalized anxiety disorder: Secondary | ICD-10-CM | POA: Diagnosis not present

## 2023-07-28 DIAGNOSIS — F411 Generalized anxiety disorder: Secondary | ICD-10-CM | POA: Diagnosis not present

## 2023-07-29 DIAGNOSIS — I890 Lymphedema, not elsewhere classified: Secondary | ICD-10-CM | POA: Diagnosis not present

## 2023-08-02 DIAGNOSIS — F411 Generalized anxiety disorder: Secondary | ICD-10-CM | POA: Diagnosis not present

## 2023-08-07 NOTE — Unmapped (Signed)
[facility] Refill Coordination Note    Specialty Medication(s) to be Shipped:   Hematology/Oncology: Capecitabine 500mg , directions: Take 4 tablets (2,000mg  total) by mouth two (2) times a day  for 14 days on and 7 days off    Other medication(s) to be shipped: No additional medications requested for fill at this time     Erin Gordon, DOB: 09-23-1976  Phone: [phone] (home)       All above HIPAA information was verified with patient.     Was a Nurse, learning disability used for this call? No    Completed refill call assessment today to schedule patient's medication shipment from the [facility] [phone]).  All relevant notes have been reviewed.     Specialty medication(s) and dose(s) confirmed: Regimen is correct and unchanged.   Changes to medications: Hydee reports no changes at this time.  Changes to insurance: No  New side effects reported not previously addressed with a pharmacist or physician: None reported  Questions for the pharmacist: No    Confirmed patient received a Conservation officer, historic buildings and a Surveyor, mining with first shipment. The patient will receive a drug information handout for each medication shipped and additional FDA Medication Guides as required.       DISEASE/MEDICATION-SPECIFIC INFORMATION        N/A    SPECIALTY MEDICATION ADHERENCE     Medication Adherence    Patient reported X missed doses in the last month: 0  Specialty Medication: capecitabine 500 MG tablet (XELODA)  Patient is on additional specialty medications: No  Informant: patient              Were doses missed due to medication being on hold? No    Capecitabine 500 mg: 7 days of medicine on hand       REFERRAL TO PHARMACIST     Referral to the pharmacist: Not needed      [facility]     Shipping address confirmed in Epic.       Delivery Scheduled: Yes, Expected medication delivery date: 08/19/23.     Medication will be delivered via Next Day Courier to the prescription address in Epic WAM.    [name]   [facility] Specialty Technician

## 2023-08-10 DIAGNOSIS — F411 Generalized anxiety disorder: Secondary | ICD-10-CM | POA: Diagnosis not present

## 2023-08-12 DIAGNOSIS — F411 Generalized anxiety disorder: Secondary | ICD-10-CM | POA: Diagnosis not present

## 2023-08-17 DIAGNOSIS — F411 Generalized anxiety disorder: Secondary | ICD-10-CM | POA: Diagnosis not present

## 2023-08-18 MED FILL — CAPECITABINE 500 MG TABLET: ORAL | 21 days supply | Qty: 112 | Fill #1

## 2023-08-19 ENCOUNTER — Ambulatory Visit: Admit: 2023-08-19 | Discharge: 2023-08-20 | Payer: PRIVATE HEALTH INSURANCE

## 2023-08-19 DIAGNOSIS — Z170 Estrogen receptor positive status [ER+]: Secondary | ICD-10-CM | POA: Diagnosis not present

## 2023-08-19 DIAGNOSIS — C50912 Malignant neoplasm of unspecified site of left female breast: Secondary | ICD-10-CM | POA: Diagnosis not present

## 2023-08-19 MED ADMIN — alteplase (ACTIVase) injection small catheter clearance 2 mg: 2 mg | INTRAVENOUS | @ 18:00:00 | Stop: 2023-08-19

## 2023-08-19 NOTE — Unmapped (Signed)
Patient requested to be seenfor port access and lab draw, medport accessed per protocol, with no BR. Cathflow instilled in port after 30 mins no BR, waited additional 30 mins. + blood return, due to time restraints patient requested to rescheduled appointment. Medport de accessed and discharged home.

## 2023-08-20 ENCOUNTER — Ambulatory Visit
Admit: 2023-08-20 | Discharge: 2023-08-21 | Payer: PRIVATE HEALTH INSURANCE | Attending: Adult Health | Primary: Adult Health

## 2023-08-20 DIAGNOSIS — C50912 Malignant neoplasm of unspecified site of left female breast: Secondary | ICD-10-CM | POA: Diagnosis not present

## 2023-08-20 DIAGNOSIS — N6489 Other specified disorders of breast: Secondary | ICD-10-CM | POA: Diagnosis not present

## 2023-08-20 DIAGNOSIS — Z170 Estrogen receptor positive status [ER+]: Secondary | ICD-10-CM | POA: Diagnosis not present

## 2023-08-20 DIAGNOSIS — F411 Generalized anxiety disorder: Secondary | ICD-10-CM | POA: Diagnosis not present

## 2023-08-20 NOTE — Unmapped (Signed)
It was a pleasure to see you today in the Breast Surgical Oncology Clinic. Please call my nurse navigator, Alinda Dooms or Willia Craze, if you have any interval questions or concerns.     Follow up in 1 year with imaging.  Mammogram at your convenience.  Referral to plastic surgery to discuss symmetry procedure  Continue surveillance as outlined. NCCN recommends that you continue to see your primary care provider for all general health care recommended for a patient your age, including cancer screening tests.  Any symptoms should be brought to the attention of your provider.  You should see your medical oncologist every 3 to 6 months for the first 3 years, every 6 to 12 months for years 4 and 5, and annually thereafter. Visits with your surgeon and radiation oncologist should be rotated with your medical oncology team initially until you are released to medical oncology or a survivorship clinic. The surgery visit should typically be paired with the imaging appointment so that we can review mammogram findings and recommendations.         Surgical oncology contact information:  For appointments, please call [phone].     Nurse Navigator available Monday through Friday 8:30 am to 4:30 pm:   Alinda Dooms, RN or Willia Craze, RN:    Phone: [phone]   Fax: [phone] attention Alinda Dooms, RN or Willia Craze, RN    For emergencies, evenings or weekends, please call [phone] and ask for the surgical oncology resident on call. Please be aware that this person is also responding to in-hospital emergencies and patient issues and may not answer your phone call immediately, but will return your call as soon as possible.

## 2023-08-20 NOTE — Unmapped (Signed)
C/o aching in left breast and sharp pains every now and again.

## 2023-08-24 NOTE — Unmapped (Signed)
Hi,     Manuelita contacted the Communication Center requesting to speak with the care team of HAMPTON COST to discuss:    Ryan is calling requesting that her infusions and orders be transferred to the [facility] location due to the cost of money to pay to park and it's time consuming. She just needs these scheduled for [facility] for her to get her keytruda infusions.     Please contact Aleksia back  at [phone].    Thank you,   KALYIAH SAINTIL  [facility]   [phone]

## 2023-08-25 DIAGNOSIS — Z419 Encounter for procedure for purposes other than remedying health state, unspecified: Secondary | ICD-10-CM | POA: Diagnosis not present

## 2023-08-27 DIAGNOSIS — F411 Generalized anxiety disorder: Secondary | ICD-10-CM | POA: Diagnosis not present

## 2023-08-27 NOTE — Unmapped (Signed)
Patient Name: Erin Gordon  Patient Age: 47 y.o.  Encounter Date: 08/20/2023    Referring Physician:   Rudie Meyer, ANP  [address]  [facility]; [address] [place],  [place] [address]    Primary Care Provider:  Jeanie Cooks, MD    Breast Cancer Treatment Team:  Surgical Oncology: Sherilyn Cooter, DO  Surgical Oncology NP: Pollyann Samples, NP and Sonda Primes, NP  Radiation Oncology: Rogelio Seen, MD  Medical Oncology: Adonis Brook, MD  Plastic Surgery: None at this time  Genetics:   [employer] Breast Cancer STAT panel with ATM and CHEK2 add-ons, on 03/12/2022, revealed VUS PMS2.    Reason for Visit: Breast cancer surveillance visit    HPI:  Erin Gordon is a 47 y.o. female who presents for followup after breast cancer surgery. Below is a synopsis of past and present health information pertinent to her breast cancer diagnosis, treatment and current health status. She is unaccompanied today. Erin Gordon was last seen in clinic on 01/01/2023. Since that visit she denies associated symptoms including redness, fever, chills, breast mass, breast retraction, nipple inversion, breast swelling, or nipple discharge. She notes her left breast aches at times, but this does not require intervention. She notes her left breast is smaller and perkier than the right since surgery and radiation. She is having difficulty fitting into clothes and bras. She does have some mild lymphedema of the left breast and upper extremity for which she has a compression sleeve, vest and pump.  She is on Xeloda with good tolerance.  She inquires about restaging studies. She denies any unusual headaches, dizziness, cough, SOB, difficulty with ROM or lymphedema.  No other specific complaints today. No other changes to her medical, surgical, family or social history.     Historically, Erin Gordon presented with a self palpated left breast mass     Diagnosis:    Left breast infiltrating ductal carcinoma   Cancer Staging   Malignant neoplasm of left breast in female, estrogen receptor positive (CMS-HCC)  Staging form: Breast, AJCC 8th Edition  - Clinical stage from 03/11/2022: Stage IIIB (cT2, cN2(f), cM0, G3, ER+, PR-, HER2-) - Signed by Talbert Cage, DO on 03/12/2022  - Pathologic stage from 12/11/2022: No Stage Recommended (ypT3, pN2a, G3, ER+, PR-, HER2-) - Signed by Rudie Meyer, ANP on 12/11/2022    Hidradenitis suppurativa of the left axilla     Procedure:   Left US guided partial mastectomy, tissue rearrangement, axillary lymph node dissection, and axillary reverse mapping and excision of left axillary skin, performed on 11/27/2022.    Clinical Trial Participant:   No clinical trials at this time.    Medical Oncology: Neoadjuvant Chemotherapy and Adjuvant Chemotherapy with  Adonis Brook, MD    Radiation Oncology: whole breast irradiation with  Rogelio Seen, MD  Radiation Treatments       Active   No active radiation treatments to show.     Historical   IMN Scv TB Bsts (Started on 02/19/2023)   Most recent fraction: 200 cGy given on 02/24/2023   Total given: 800 cGy / 1,000 cGy  (4 of 5 fractions)   Elapsed Days: 5   Technique: IMRT- VMAT        LeftScvBrst+SIB (Started on 01/15/2023)   Most recent fraction: 220 cGy given on 02/18/2023   Total given: 5,500 cGy / 5,500 cGy  (25 of 25 fractions)   Elapsed Days: 34   Technique: IMRT- VMAT  Genetics Evaluation:  [employer]ae Breast Cancer STAT panel with ATM and CHEK2 add-ons, on 03/12/2022, revealed VUS PMS2.     Review of Systems:  10 organ systems reviewed and pertinent as noted in HPI.       Medical history reviewed as below. I have also reviewed additional records as provided.  Hematology/Oncology History Overview Note   2023: Left breast cT2 cN2 IDC, G3, weakly+2%/-/-     Malignant neoplasm of left breast in female, estrogen receptor positive (CMS-HCC)   02/12/2022 -  Presenting Symptoms    Self palpated left breast mass    MMG/US: Left breast 6:00 5 CFN there is a 3.2 x 1.8 x 2.4 cm irregular cystic and solid mass. Echogenic foci within the mass likely correspond to pleomorphic calcifications seen on mammography.  Left breast 6:00 2 CFN there is a 1.0 x 0.5 x 0.7 cm hypoechoic circumscribed mass with internal vascularity.  Left axilla there is multiple abnormally enlarged lymph nodes, largest demonstrating a cortical thickness of 1.9 cm.  Right breast 9:00 retroareolar there is  a 0.6 x 0.4 x  0.5 cm irregular hypoechoic mass.     03/03/2022 Biopsy    Left breast core biopsy, 6:00 5 CFN: IDC, G3, ER+(2%), ER-, Her2-(1+).    Left breast core biopsy, 6:00 2 CFN: benign breast tissue with stromal fibrosis and apocrine cysts.    Left axilla core biopsy: IDC, G3, ER+(2%), ER-, Her2-(1+).    Right breast core biopsy, retroareolar: benign breast tissue with stromal fibrosis and apocrine cysts.     03/11/2022 Initial Diagnosis    Malignant neoplasm of left breast in female, estrogen receptor positive (CMS-HCC)     03/11/2022 -  Cancer Staged    Staging form: Breast, AJCC 8th Edition  - Clinical stage from 03/11/2022: Stage IIIB (cT2, cN2(f), cM0, G3, ER+, PR-, HER2-) - Signed by Talbert Cage, DO on 03/12/2022       03/12/2022 Tumor Board    MDC recs: Left breast cT2 N1 IDC, G3, ER+(2%), PR-, HER2-  Imaging review with 3.2 cm area bx proven IDC and multiple (>7) enlarged axillary LNs. Additional left breast bx benign and concordant. Right breast bx benign and concordant.  NACT given locally advanced, schedule with med/onc add on next week  Staging studies  Possible PRAD trial candidate  Genetics: Meets GT criteria based on dx br cancer <50yo. Stat breast panel + ATM + CHEK2 and econsult     03/25/2022 Interval Scan(s)      MRI bilateral breast completed revealed 3.7 cm transverse by 3.3 cm AP by 2.6 cm craniocaudal. It demonstrates central necrosis with peripheral nodular enhancement. There is also an oval mass measuring 9 mm in the left breast, this mass has not been biopsied. However biopsied was recommended since patient is considering breast conservation surgery post chemotherapy. Patient was scheduled for biopsy of the 9 mm mass, but ultimately declined biopsy due to claustrophobia.     04/23/2022 -  Chemotherapy    STUDY [id number] IRB# [id number] NEOADJUVANT PEMBROLIZUMAB +/- RT WITH PACLItaxel / CARBOplatin FOLLOWED BY ddAC (v. 05/16/21)  P-RAD: A Randomized Study of Preoperative Chemotherapy, Pembrolizumab and No, Low or High Dose RADiation in Node-Positive, HER2-Negative Breast Cancer     12/11/2022 -  Cancer Staged    Staging form: Breast, AJCC 8th Edition  - Pathologic stage from 12/11/2022: No Stage Recommended (ypT3, pN2a, G3, ER+, PR-, HER2-) - Signed by Rudie Meyer, ANP on 12/11/2022  Malignant neoplasm of left breast in female, estrogen receptor positive, unspecified site of breast (CMS-HCC)   03/25/2022 Interval Scan(s)      MRI bilateral breast completed revealed 3.7 cm transverse by 3.3 cm AP by 2.6 cm craniocaudal. It demonstrates central necrosis with peripheral nodular enhancement. There is also an oval mass measuring 9 mm in the left breast, this mass has not been biopsied. However biopsied was recommended since patient is considering breast conservation surgery post chemotherapy. Patient was scheduled for biopsy of the 9 mm mass, but ultimately declined biopsy due to claustrophobia.     03/26/2022 Initial Diagnosis    Malignant neoplasm of left breast in female, estrogen receptor positive, unspecified site of breast (CMS-HCC)     03/26/2022 - 06/08/2022 Radiation    Radiation Therapy Treatment Details (03/26/2022 - 06/08/2022)  Site: Right Breast  Technique: IMRT  Goal: Curative  Planned Treatment Start Date: No planned start date specified     01/01/2023 -  Radiation    Radiation Therapy Treatment Details (Noted on 01/01/2023)  Site: Left Breast with lymph nodes  Technique: VMAT  Goal: No goal specified  Planned Treatment Start Date: No planned start date specified       Past Medical History:   Diagnosis Date    Hypertension     Malignant neoplasm of left breast in female, estrogen receptor positive (CMS-HCC) 03/11/2022     Past Surgical History:   Procedure Laterality Date    BREAST BIOPSY      CHEMOTHERAPY      HYSTERECTOMY      IR INSERT PORT AGE GREATER THAN 5 YRS  04/23/2022    IR INSERT PORT AGE GREATER THAN 5 YRS 04/23/2022 Dorene Ar, PA IMG VIR [facility]    PR INTRAOPERATIVE SENTINEL LYMPH NODE ID W DYE INJECTION Left 11/27/2022    Procedure: INTRAOPERATIVE IDENTIFICATION SENTINEL LYMPH NODE(S) INCLUDE INJECTION NON-RADIOACTIVE DYE, WHEN PERFORMED;  Surgeon: Talbert Cage, DO;  Location: OR [facility];  Service: Surgical Oncology Breast    PR MASTECTOMY, PARTIAL Left 11/27/2022    Procedure: MASTECTOMY, PARTIAL (EG, LUMPECTOMY, TYLECTOMY, QUADRANTECTOMY, SEGMENTECTOMY);  Surgeon: Talbert Cage, DO;  Location: OR [facility];  Service: Surgical Oncology Breast    PR REMOVE ARMPITS LYMPH NODES COMPLT Left 11/27/2022    Procedure: AXILLARY LYMPHADENECTOMY; COMPLETE;  Surgeon: Talbert Cage, DO;  Location: OR [facility];  Service: Surgical Oncology Breast     Family History   Problem Relation Age of Onset    Diabetes Mother     Hypertension Mother     Parkinsonism Father     No Known Problems Sister     No Known Problems Daughter     No Known Problems Maternal Grandmother     No Known Problems Maternal Grandfather     Ovarian cancer Paternal Grandmother 70    Cancer Paternal Uncle         colon or prostate ?age    Breast cancer Paternal Cousin 72        recent dx, limited cntact    Breast cancer Other         3x paternal great aunts (PGF sisters) with breast ca ?ages; there may be other more distant paternal cousins w/ bresat ca    BRCA 1/2 Neg Hx     Colon cancer Neg Hx     Endometrial cancer Neg Hx      Social History     Socioeconomic History    Marital status:  Single   Tobacco Use    Smoking status: Former     Current packs/day: 0.00 Types: Cigarettes     Quit date: 08/19/2023     Years since quitting: 0.0    Smokeless tobacco: Never   Vaping Use    Vaping status: Never Used   Substance and Sexual Activity    Alcohol use: Yes    Drug use: Yes     Types: Marijuana    Sexual activity: Yes     Partners: Male   Social History Narrative    The patient is committed relationship.  She works as Scientist, research (medical). She has four children.     Social Determinants of Health     Financial Resource Strain: High Risk (03/21/2022)    Overall Financial Resource Strain (CARDIA)     Difficulty of Paying Living Expenses: Hard   Food Insecurity: Food Insecurity Present (03/21/2022)    Hunger Vital Sign     Worried About Running Out of Food in the Last Year: Sometimes true     Ran Out of Food in the Last Year: Sometimes true   Transportation Needs: Unmet Transportation Needs (03/21/2022)    PRAPARE - Therapist, art (Medical): No     Lack of Transportation (Non-Medical): Yes       Current Outpatient Medications:     albuterol HFA 90 mcg/actuation inhaler, Inhale 2 puffs every six (6) hours as needed for wheezing., Disp: 8 g, Rfl: 2    aspirin 325 MG tablet, Take 1 tablet (325 mg total) by mouth daily., Disp: , Rfl:     capecitabine (XELODA) 500 MG tablet, Take 4 tablets (2,000 mg total) by mouth two (2) times a day for 14 days on and 7 days off., Disp: 112 tablet, Rfl: 3    erythromycin (ROMYCIN) 5 mg/gram (0.5 %) ophthalmic ointment, Place a 1/2 inch ribbon of ointment into the lower eyelid., Disp: 3.5 g, Rfl: 0    hydrocortisone (ANUSOL-HC) 2.5 % rectal cream, Insert into the rectum two (2) times a day., Disp: 30 g, Rfl: 5    losartan (COZAAR) 50 MG tablet, Take 1 tablet (50 mg total) by mouth daily. (Patient not taking: Reported on 07/08/2023), Disp: 90 tablet, Rfl: 3    mometasone (ELOCON) 0.1 % cream, Apply to affected area daily, Disp: 90 g, Rfl: 1    omeprazole (PRILOSEC) 40 MG capsule, Take 1 capsule (40 mg total) by mouth daily., Disp: 30 capsule, Rfl: 3    oxybutynin (DITROPAN) 5 MG tablet, Take 0.5-1 tablets (2.5-5 mg total) by mouth Three (3) times a day as needed. For hot flashes, Disp: 90 tablet, Rfl: 11    silver sulfADIAZINE (SILVADENE, SSD) 1 % cream, Apply to affected area daily, Disp: 50 g, Rfl: 1    traMADol (ULTRAM) 50 mg tablet, Take 1 tablet (50 mg total) by mouth every six (6) hours as needed for pain., Disp: 30 tablet, Rfl: 0  No Known Allergies      Physical Examination:   Blood pressure 139/97, pulse 80, temperature 36.4 ??C (97.5 ??F), temperature source Temporal, resp. rate 16, weight 85.6 kg (188 lb 12.8 oz), SpO2 100%.  General Appearance:  No acute distress, well appearing and well nourished.   Head:  Normocephalic, atraumatic.   Eyes:  Conjuctiva and lids appear normal. Pupils equal and round,   sclera anicteric.   Ears:  Overall appearance normal with no scars, lesions or  masses.  Hearing is grossly normal.   Nose: Nares grossly normal, no drainage.   Throat: Lips, mucosa, and tongue normal; teeth and gums normal.   Neck: Supple, symmetrical, trachea midline, no adenopathy;   thyroid: no enlargement/tenderness/nodules.   Pulmonary:    Normal respiratory effort.    Musculoskeletal: Normal gait. Extremities without clubbing, cyanosis, or           edema.   Skin: Skin color, texture, turgor normal, no rashes or lesions.   Neurologic: No motor abnormalities noted. Sensation grossly intact.   Lymphatic:  Breast: No cervical or supraclavicular LAD noted.   A comprehensive examination of the breasts and chest wall was performed with the patient upright and supine with arms at her sides and above her head.   Left breast normal in size, normal contour, no dimpling, nipple everted, nipple exhibits no crusting or excoriation, no masses or nodules, no palpable axillary adenopathy. Skin is thickened, hyperpigmented and slightly edematous from prior radiation. infraclavicular and supraclavicular nodal examinations are negative. Arm circumference is slightly increased. Cosmetic outcome is excellent.  Right breast is larger than the left. normal in size, normal symmetry, normal contour, no dimpling, no skin changes, nipple everted, nipple exhibits no crusting or excoriation, no masses or nodules, no palpable axillary adenopathy.   Psychiatric: Judgement and insight appropriate. Oriented to person,         place, and time.       Imaging Review:  None recent      I have personally reviewed the breast imaging, laboratory values, existing medical records, and pathology. I have summarized these findings and my interpretation in the oncology history and assessment above.    Impression:  Ms. Norment is a 47 y.o. female with a history of left breast infiltrating ductal carcinoma, estrogen receptor positive, progesterone receptor negative, and HER2 negative. See treatment summary above.   Staging   Cancer Staging   Malignant neoplasm of left breast in female, estrogen receptor positive (CMS-HCC)  Staging form: Breast, AJCC 8th Edition  - Clinical stage from 03/11/2022: Stage IIIB (cT2, cN2(f), cM0, G3, ER+, PR-, HER2-) - Signed by Talbert Cage, DO on 03/12/2022  - Pathologic stage from 12/11/2022: No Stage Recommended (ypT3, pN2a, G3, ER+, PR-, HER2-) - Signed by Rudie Meyer, ANP on 12/11/2022    There is no evidence of disease at 10 months.    Recommendations:  Continue current therapy as prescribed by medical oncology.  Continue surveillance as outlined. NCCN recommendations include having a primary care provider for all general health care, including cancer screening tests. Visits with medical oncology should occur every 3 to 6 months for the first 3 years, every 6 to 12 months for years 4 and 5, and annually thereafter. Visits with surgical oncology and radiation oncology should be rotated with medical oncology initially until the patient is released to medical oncology or a survivorship clinic. The surgery visit should typically be paired with the imaging appointment so that we can review mammogram findings and recommendations.  Referral to plastic surgery for consideration of a symmetry procedure.  Encouraged to use compression garments as prescribed for management of her lymphedema.  Mammogram at her convenience. I will notify her of these results, once available. I will otherwise plan to see her again in one year with repeat imaging. She will call for a sooner appointment for any interval breast concerns.  That I will defer staging studies to medical oncology. Reassured the patient that she is currently without  s/sx of recurrence.    All of the patient's questions and concerns were addressed during the visit and she is in agreement with the plan of care. Please do not hesitate to contact me with questions or concerns.     Cancer Staging   Malignant neoplasm of left breast in female, estrogen receptor positive (CMS-HCC)  Staging form: Breast, AJCC 8th Edition  - Clinical stage from 03/11/2022: Stage IIIB (cT2, cN2(f), cM0, G3, ER+, PR-, HER2-) - Signed by Talbert Cage, DO on 03/12/2022  - Pathologic stage from 12/11/2022: No Stage Recommended (ypT3, pN2a, G3, ER+, PR-, HER2-) - Signed by Rudie Meyer, ANP on 12/11/2022      Pollyann Samples, NP-C  Surgical Breast Oncology  08/27/2023  1:43 PM

## 2023-08-28 ENCOUNTER — Ambulatory Visit: Admit: 2023-08-28 | Discharge: 2023-08-29 | Payer: PRIVATE HEALTH INSURANCE

## 2023-08-28 DIAGNOSIS — Z170 Estrogen receptor positive status [ER+]: Secondary | ICD-10-CM | POA: Diagnosis not present

## 2023-08-28 DIAGNOSIS — Z5112 Encounter for antineoplastic immunotherapy: Secondary | ICD-10-CM | POA: Diagnosis not present

## 2023-08-28 DIAGNOSIS — C50912 Malignant neoplasm of unspecified site of left female breast: Secondary | ICD-10-CM | POA: Diagnosis not present

## 2023-08-28 LAB — MANUAL DIFFERENTIAL
EOSINOPHILS - ABS (DIFF): 0 10*9/L (ref 0.0–0.5)
EOSINOPHILS - REL (DIFF): 1 %
LYMPHOCYTES - ABS (DIFF): 1.2 10*9/L (ref 1.1–3.6)
LYMPHOCYTES - REL (DIFF): 31 %
MONOCYTES - ABS (DIFF): 0.4 10*9/L (ref 0.3–0.8)
MONOCYTES - REL (DIFF): 9 %
NEUTROPHILS - ABS (DIFF): 2.3 10*9/L (ref 1.8–7.8)
NEUTROPHILS - REL (DIFF): 59 %

## 2023-08-28 LAB — COMPREHENSIVE METABOLIC PANEL
ALBUMIN: 3.5 g/dL (ref 3.4–5.0)
ALKALINE PHOSPHATASE: 70 U/L (ref 46–116)
ALT (SGPT): 15 U/L (ref 10–49)
ANION GAP: 5 mmol/L (ref 5–14)
AST (SGOT): 26 U/L (ref <=34)
BILIRUBIN TOTAL: 0.2 mg/dL — ABNORMAL LOW (ref 0.3–1.2)
BLOOD UREA NITROGEN: 12 mg/dL (ref 9–23)
BUN / CREAT RATIO: 20
CALCIUM: 9.4 mg/dL (ref 8.7–10.4)
CHLORIDE: 110 mmol/L — ABNORMAL HIGH (ref 98–107)
CO2: 26.9 mmol/L (ref 20.0–31.0)
CREATININE: 0.6 mg/dL
EGFR CKD-EPI (2021) FEMALE: >90 mL/min/{1.73_m2} (ref >=60)
GLUCOSE RANDOM: 97 mg/dL (ref 70–179)
POTASSIUM: 4.2 mmol/L (ref 3.4–4.8)
PROTEIN TOTAL: 6.7 g/dL (ref 5.7–8.2)
SODIUM: 142 mmol/L (ref 135–145)

## 2023-08-28 LAB — CORTISOL: CORTISOL TOTAL: 5.2 ug/dL

## 2023-08-28 LAB — CBC W/ AUTO DIFF
HEMATOCRIT: 32.1 % — ABNORMAL LOW (ref 34.0–44.0)
HEMOGLOBIN: 10.6 g/dL — ABNORMAL LOW (ref 11.3–14.9)
MEAN CORPUSCULAR HEMOGLOBIN CONC: 32.8 g/dL (ref 32.0–36.0)
MEAN CORPUSCULAR HEMOGLOBIN: 27 pg (ref 25.9–32.4)
MEAN CORPUSCULAR VOLUME: 82.4 fL (ref 77.6–95.7)
MEAN PLATELET VOLUME: 7.8 fL (ref 6.8–10.7)
PLATELET COUNT: 154 10*9/L (ref 150–450)
RED BLOOD CELL COUNT: 3.9 10*12/L — ABNORMAL LOW (ref 3.95–5.13)
RED CELL DISTRIBUTION WIDTH: 16.7 % — ABNORMAL HIGH (ref 12.2–15.2)
WBC ADJUSTED: 3.9 10*9/L (ref 3.6–11.2)

## 2023-08-28 LAB — TSH: THYROID STIMULATING HORMONE: 0.852 u[IU]/mL (ref 0.550–4.780)

## 2023-08-28 MED ADMIN — heparin, porcine (PF) 100 unit/mL injection 500 Units: 500 [IU] | INTRAVENOUS | @ 19:00:00 | Stop: 2023-08-28

## 2023-08-28 MED ADMIN — pembrolizumab (KEYTRUDA) 400 mg in sodium chloride (NS) 0.9 % 50 mL IVPB: 400 mg | INTRAVENOUS | @ 18:00:00 | Stop: 2023-08-28

## 2023-08-28 NOTE — Unmapped (Signed)
Patient arrived in the infusion clinic at 1300. Weight and vitals were obtained. Port was accessed during visit, RN flushed with blood return, dressing clean, dry and intact. Labs were drawn during visit and resulted within treatment plan parameters. No premeds per treatment plan. Patient was administered chemotherapy regimen as ordered without complications while in the clinic. Pt port heparin flushed and deaccessed, covered with 2x2 gauze and bandaid. Patient discharged home to self care.

## 2023-08-31 NOTE — Unmapped (Incomplete)
APPOINTMENTS / QUESTIONS  For NON-urgent questions regarding appointments or your surgical care, you may reach out to your provider and/or their nursing staff via MyChart. Please keep in mind that it may take up to 2-3 business days for a response.    Additionally, you may contact our clinics, Monday through Friday, 8 am to 4:30 pm    [facility]  [address],   [place] [address]  T: [phone]  F: [phone]    [facility]  [address], [place], [place] [address]  Ground Floor [facility]  T: [phone]  F: [phone]    [facility]  [address] [place], [place] [address]  T: [phone]  F: [phone]    [facility] Plastic Surgery at [facility]  [address], [place], [place] [address]  Third Floor, [address]  T: [phone]   F: [phone]    Ambulatory Surgery Center  1st Floor, Ambulatory [facility]  [address]  [place], [place] [address]  T: [phone]  Website:  [facility]   [address]  [place], [place] [address]  Website:  [facility] [email or web address])     Minor Procedure Room  Multispecialty Clinic  1st Floor [facility]   [address]   [place], [place] [address]    Local Anesthesia Procedure Information    Please arrive to the Multispecialty Surgery Clinic 30 minutes prior to your procedure time.    The address is [address] [place] [address] [facility])  You will enter on the Ground floor. When you arrive, do not stop at the registration on the Ground floor. Proceed to [facility] and take the escalators up to the 1st floor. Enter the Multispecialty Surgery Clinic and check in at the front desk.    If you need to ask the information desk for help wayfinding, tell them you are looking for the Multispecialty Surgery Clinic on the 1st floor of [facility].    If you arrive 15 minutes or more after your procedure time, you may be rescheduled.    If you need to contact us on the day of your procedure, please call the Plastic and Reconstructive Surgery Clinic at [phone].    Administrative Office  [address]  [place], [place] [address]  T:  [phone]  F:  [phone]      Physicians:    Janeece Fitting MD:   Nurse: Berta Minor BSN, RN [phone]    Epimenio Sarin MD:   Nurse:  Roland Earl BSN, RN [phone]     Tarry Kos MD:   Nurse:  Elisabeth Pigeon BSN, RN, CPSN [phone]    Calvert Cantor MD and Arne Cleveland MD   Nurse: Hendricks Milo BSN, RN [phone]     Adeyemi Clydie Braun MD:  Nurse: Jasmine December BSN, RN, [place]  [phone]        Advance Practice Providers:  Ezekiel Slocumb, DNP, NP-C and Mariana Arn, [place]   Nurse:  Hendricks Milo BSN, RN [phone]    Rush Landmark, PA-C:    Nurse:  Berta Minor BSN, RN [phone]        Surgery Scheduling:  Vanice Renate [phone]  You will receive a phone call from the surgery schedulers regarding date for surgery in about 10 business from your appointment.  Please call one of the above numbers if you have not received a phone call 2 weeks after your appointment with the provider.        Financial Navigator:  Arlee Muslim  Hours:  7:30 am to 4 pm  P: [phone] - F: [phone]           Billing Questions/Financial Navigation:  [phone]  [facility] Customer Service Call Center:  [phone]    [facility] Imaging Contact Information:   Please call to schedule  *Radiology (CT, X-ray, Ultrasound) [phone]  *Mammography & Breast Ultrasound [phone]  *MRI [phone]  *Interventional Radiology Procedure Scheduling [phone]    AFTER HOURS/HOLIDAYS:  For emergencies after-hours (after 5pm, or weekends): call the [facility] operator [phone]) and ask to page the Plastic Surgery resident on call. You will be directed to a surgery resident who likely is not immediately aware of the details of your case, but can help you deal with any emergencies that cannot wait until regular business hours.  Please be aware that this person is responding to many in-hospital emergencies and patient issues and may not answer your phone call immediately, but will return your call as soon as possible.    Precare Location:  [employer] Pre-Procedure Services at [facility]   [address]  [place], [place] [address] ([address] - old [address] near [employer])    Blood Work Location:  [address] [place] [address]. There are no suite numbers, but it is located on the first floor there. Go to the main desk and tell them you are there for walk-in labs.  Precare:   The day prior to your scheduled surgery, Pre-Care will call you with instructions.  If you have not heard from them by 4PM, and would like to check on the status of your surgery, please call:  [facility]: [phone]  [facility]: [phone]  [facility]:  [phone]    Insurance Denials:  All appeal initiation needs to be started by the patient.  We do not appeal insurance denials.     [employer] Hotline:    [phone]    [facility]MA forms and paperwork:  Please allow  a 2 week turn around for forms to be completed and faxed.

## 2023-09-01 ENCOUNTER — Ambulatory Visit: Admit: 2023-09-01 | Discharge: 2023-09-02 | Payer: PRIVATE HEALTH INSURANCE

## 2023-09-01 DIAGNOSIS — Z08 Encounter for follow-up examination after completed treatment for malignant neoplasm: Secondary | ICD-10-CM | POA: Diagnosis not present

## 2023-09-01 DIAGNOSIS — Z853 Personal history of malignant neoplasm of breast: Secondary | ICD-10-CM | POA: Diagnosis not present

## 2023-09-01 DIAGNOSIS — Z170 Estrogen receptor positive status [ER+]: Secondary | ICD-10-CM | POA: Diagnosis not present

## 2023-09-01 DIAGNOSIS — C50912 Malignant neoplasm of unspecified site of left female breast: Secondary | ICD-10-CM | POA: Diagnosis not present

## 2023-09-01 DIAGNOSIS — R928 Other abnormal and inconclusive findings on diagnostic imaging of breast: Principal | ICD-10-CM

## 2023-09-02 LAB — ACTH: ADRENOCORTICOTROPIC HORMONE: 5.2 pg/mL — ABNORMAL LOW

## 2023-09-04 NOTE — Unmapped (Signed)
Patient Erin Gordon was contacted today regarding coordinating appointments. Voicemail was left for patient with information to call back.

## 2023-09-04 NOTE — Unmapped (Signed)
[facility] Refill Coordination Note    Specialty Medication(s) to be Shipped:   Hematology/Oncology: Capecitabine 500mg , directions: Take 4 tablets (2,000 mg total) by mouth two (2) times a day for 14 days on and 7 days off.    Other medication(s) to be shipped: No additional medications requested for fill at this time     Erin Gordon, DOB: 04/20/1976  Phone: [phone] (home)       All above HIPAA information was verified with patient.     Was a Nurse, learning disability used for this call? No    Completed refill call assessment today to schedule patient's medication shipment from the [facility]  [phone]).  All relevant notes have been reviewed.     Specialty medication(s) and dose(s) confirmed: Regimen is correct and unchanged.   Changes to medications: Chyra reports no changes at this time.  Changes to insurance: No  New side effects reported not previously addressed with a pharmacist or physician: None reported  Questions for the pharmacist: No    Confirmed patient received a Conservation officer, historic buildings and a Surveyor, mining with first shipment. The patient will receive a drug information handout for each medication shipped and additional FDA Medication Guides as required.       DISEASE/MEDICATION-SPECIFIC INFORMATION        N/A    SPECIALTY MEDICATION ADHERENCE     Medication Adherence    Patient reported X missed doses in the last month: 0  Specialty Medication: capecitabine 500 MG tablet (XELODA)  Patient is on additional specialty medications: No  Informant: patient              Were doses missed due to medication being on hold? No    Capecitabine 500 mg: 0 days of medicine on hand       REFERRAL TO PHARMACIST     Referral to the pharmacist: Not needed      [facility]     Shipping address confirmed in Epic.       Delivery Scheduled: Yes, Expected medication delivery date: 09/09/23.     Medication will be delivered via Same Day Courier to the prescription address in Epic WAM.    [name]   [facility]  Specialty Technician

## 2023-09-07 NOTE — Unmapped (Signed)
Patient Erin Gordon was contacted today regarding scheduling an appointment. Appointment scheduled and confirmed with patient.

## 2023-09-07 NOTE — Unmapped (Unsigned)
Plastic Surgery  Clinic H&P Note  Breast Reconstruction      Patient Name: Erin Gordon  Patient Age: 47 y.o.  Encounter Date: 09/08/2023    Referring Physician: Rudie Meyer, ANP  [address]  [facility] [address]; [address] [place],  [place] [address]  Primary Care Physician: Jeanie Cooks, MD    Surgical Oncologist: Sherilyn Cooter, MD        Chief Complaint: I would like breast reconstruction.    Assessment:  CLETA ANTKOWIAK is a 47 y.o. female with diagnosis of Left breast cancer who desires breast reconstruction/symmetry procedure s/p BCT.    Breast cancer: Left breast IDC +/-/-  Breast surgery: left lumpectomy  Chemo: NACT, now on xeloda  Radiation: adjuvant XRT, ***(skin changes)    Significant comorbidites: HTN  Smoking status: ***        Recommendations/Plan:  {AAARECONPLANTIMING:63163}  {AAARECONPLAN:62674}    - Patient requires preop CT chest/abdomen/pelvis*** lower extremity***for presurgical planning  - Patient requires mammography prior to surgery  ***    {AAACONSENTOBTAINED:62676}  - Consent discussion was held, as follows below:  We had an extensive discussion regarding implants vs autologous reconstruction. The discussion of implants included saline vs silicone implants, prepectoral vs subpectoral placement, and possible total submuscular coverage; acellular dermal matrix use, current FDA off-label status, and red breast syndrome; cosmesis and subjective feel, rupture risks/rates, and FDA guidelines for MRI follow-up studies; the risk of BIA-ALCL in textured implants; and the use of tissue expanders. The discussion of autologous reconstruction included pedicled vs free flaps; pedicled latissimus flaps with and without tissue expanders and implants; abdominally based free flaps, including pedicled TRAM, free TRAM and MS-TRAM, and DIEP; and fat grafting for both primary reconstruction and for revision of reconstruction. We discussed the likely need for additional revision of reconstruction, the impact of radiation on reconstructive breast surgery and the differing aesthetics of native and reconstructed breasts, possible delays in oncologic care if complications were to arise, numbness to the breast/nipple/arm, and nipple-areola reconstruction including 3D tattoos vs flaps and flat tattooing.    We discussed the risks, benefits, and alternatives of the above, and we answered all questions the pt had regarding her options. She ultimately opted for ***.        History of Present Illness:     Erin Gordon is a 47 y.o. female with PMH of left breast cancer. She had a self-palpated left breast mass in 2023 that ultimately returned as left breast IDC +/-/-. She underwent NACT followed by lumpectomy (11/27/22 [facility]) and then adjuvant XRT (EOT 02/2023). . She is now on xeloda. 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 She now notes that the left breast is smaller and perkier than the right breast since undergoing lumpectomy and XRT. She has been referred to plastic surgery to discuss options for symmetry procedures.    Patient wears a *** cup bra. Her goal cup size is ***. She like the *** breast better.          Physical Exam  Neuro: alert and oriented, pain well controlled  Pulmonary: Normal respiratory effort.   Cardiovascular: Regular rate.   Abdo: soft, nontender, nondistended, no rebound, no guarding  GU: no Foley in place  Extr: Warm/well perfused, no edema    Breast: bilateral breasts examined. ***     Right Breast  Left Breast   Sternal notch to nipple distance (cm) *** ***   Nipple to fold (cm) *** ***  Base width *** ***   Ptosis grade *** ***   Scars *** ***             Past Medical History:  Past Medical History:   Diagnosis Date    Hypertension     Malignant neoplasm of left breast in female, estrogen receptor positive (CMS-HCC) 03/11/2022       Past Surgical History:  Past Surgical History:   Procedure Laterality Date    BREAST BIOPSY      BREAST LUMPECTOMY Left 11/27/2022    CHEMOTHERAPY      HYSTERECTOMY      IR INSERT PORT AGE GREATER THAN 5 YRS  04/23/2022    IR INSERT PORT AGE GREATER THAN 5 YRS 04/23/2022 Dorene Ar, PA IMG VIR [facility]    PR INTRAOPERATIVE SENTINEL LYMPH NODE ID W DYE INJECTION Left 11/27/2022    Procedure: INTRAOPERATIVE IDENTIFICATION SENTINEL LYMPH NODE(S) INCLUDE INJECTION NON-RADIOACTIVE DYE, WHEN PERFORMED;  Surgeon: Talbert Cage, DO;  Location: OR [facility];  Service: Surgical Oncology Breast    PR MASTECTOMY, PARTIAL Left 11/27/2022    Procedure: MASTECTOMY, PARTIAL (EG, LUMPECTOMY, TYLECTOMY, QUADRANTECTOMY, SEGMENTECTOMY);  Surgeon: Talbert Cage, DO;  Location: OR [facility];  Service: Surgical Oncology Breast    PR REMOVE ARMPITS LYMPH NODES COMPLT Left 11/27/2022    Procedure: AXILLARY LYMPHADENECTOMY; COMPLETE;  Surgeon: Talbert Cage, DO;  Location: OR [facility];  Service: Surgical Oncology Breast    RADIATION         Medications:  Current Outpatient Medications   Medication Sig Dispense Refill    albuterol HFA 90 mcg/actuation inhaler Inhale 2 puffs every six (6) hours as needed for wheezing. 8 g 2    aspirin 325 MG tablet Take 1 tablet (325 mg total) by mouth daily.      capecitabine (XELODA) 500 MG tablet Take 4 tablets (2,000 mg total) by mouth two (2) times a day for 14 days on and 7 days off. 112 tablet 3    erythromycin (ROMYCIN) 5 mg/gram (0.5 %) ophthalmic ointment Place a 1/2 inch ribbon of ointment into the lower eyelid. 3.5 g 0    hydrocortisone (ANUSOL-HC) 2.5 % rectal cream Insert into the rectum two (2) times a day. 30 g 5    losartan (COZAAR) 50 MG tablet Take 1 tablet (50 mg total) by mouth daily. (Patient not taking: Reported on 07/08/2023) 90 tablet 3    mometasone (ELOCON) 0.1 % cream Apply to affected area daily 90 g 1    omeprazole (PRILOSEC) 40 MG capsule Take 1 capsule (40 mg total) by mouth daily. 30 capsule 3    oxybutynin (DITROPAN) 5 MG tablet Take 0.5-1 tablets (2.5-5 mg total) by mouth Three (3) times a day as needed. For hot flashes 90 tablet 11    silver sulfADIAZINE (SILVADENE, SSD) 1 % cream Apply to affected area daily 50 g 1    traMADol (ULTRAM) 50 mg tablet Take 1 tablet (50 mg total) by mouth every six (6) hours as needed for pain. 30 tablet 0     No current facility-administered medications for this visit.       Allergies:  has No Known Allergies.    Family History:  Family History   Problem Relation Age of Onset    Diabetes Mother     Hypertension Mother     Parkinsonism Father     No Known Problems Sister     No Known Problems Daughter  No Known Problems Maternal Grandmother     No Known Problems Maternal Grandfather     Ovarian cancer Paternal Grandmother 29    Cancer Paternal Uncle         colon or prostate ?age    Breast cancer Paternal Cousin 33        recent dx, limited cntact    Breast cancer Other         3x paternal great aunts (PGF sisters) with breast ca ?ages; there may be other more distant paternal cousins w/ bresat ca    BRCA 1/2 Neg Hx     Colon cancer Neg Hx     Endometrial cancer Neg Hx        Social History:   Tobacco use:  reports that she quit smoking about 2 weeks ago. Her smoking use included cigarettes. She has never used smokeless tobacco.  Alcohol use:  reports current alcohol use.  Drug use:  reports current drug use. Drug: Marijuana.    Review of Systems: Remainder of a 10 system review of systems is negative.     Objective     Diagnostic Studies:   Results for orders placed or performed in visit on 08/28/23   TSH   Result Value Ref Range    TSH 0.852 0.550 - 4.780 uIU/mL   ACTH   Result Value Ref Range    ACTH 5.2 (L) pg/mL   Cortisol   Result Value Ref Range    Cortisol 5.2 See Comment ug/dL   Comprehensive Metabolic Panel   Result Value Ref Range    Sodium 142 135 - 145 mmol/L    Potassium 4.2 3.4 - 4.8 mmol/L    Chloride 110 (H) 98 - 107 mmol/L    CO2 26.9 20.0 - 31.0 mmol/L    Anion Gap 5 5 - 14 mmol/L    BUN 12 9 - 23 mg/dL Creatinine 0.98 1.19 - 1.02 mg/dL    BUN/Creatinine Ratio 20     eGFR CKD-EPI (2021) Female >90 >=60 mL/min/1.21m2    Glucose 97 70 - 179 mg/dL    Calcium 9.4 8.7 - 14.7 mg/dL    Albumin 3.5 3.4 - 5.0 g/dL    Total Protein 6.7 5.7 - 8.2 g/dL    Total Bilirubin 0.2 (L) 0.3 - 1.2 mg/dL    AST 26 <=82 U/L    ALT 15 10 - 49 U/L    Alkaline Phosphatase 70 46 - 116 U/L   CBC w/ Differential   Result Value Ref Range    WBC 3.9 3.6 - 11.2 10*9/L    RBC 3.90 (L) 3.95 - 5.13 10*12/L    HGB 10.6 (L) 11.3 - 14.9 g/dL    HCT 95.6 (L) 21.3 - 44.0 %    MCV 82.4 77.6 - 95.7 fL    MCH 27.0 25.9 - 32.4 pg    MCHC 32.8 32.0 - 36.0 g/dL    RDW 08.6 (H) 57.8 - 15.2 %    MPV 7.8 6.8 - 10.7 fL    Platelet 154 150 - 450 10*9/L   Manual Differential   Result Value Ref Range    Neutrophils % 59 %    Lymphocytes % 31 %    Monocytes % 9 %    Eosinophils % 1 %    Absolute Neutrophils 2.3 1.8 - 7.8 10*9/L    Absolute Lymphocytes 1.2 1.1 - 3.6 10*9/L    Absolute Monocytes 0.4 0.3 - 0.8 10*9/L  Absolute Eosinophils 0.0 0.0 - 0.5 10*9/L    Absolute Basophils      Smear Review Comments See Comment (A) Undefined    Giant Platelets Present (A) Not Present    Target Cells Moderate (A) Not Present       Imaging: {GCS Imaging findings:30421597}

## 2023-09-08 ENCOUNTER — Ambulatory Visit
Admit: 2023-09-08 | Payer: PRIVATE HEALTH INSURANCE | Attending: Plastic and Reconstructive Surgery | Primary: Plastic and Reconstructive Surgery

## 2023-09-09 MED FILL — CAPECITABINE 500 MG TABLET: ORAL | 21 days supply | Qty: 112 | Fill #2

## 2023-09-17 NOTE — Unmapped (Signed)
Called and left msg for pt that due to Dr Carles Collet not being in clinic on 11/12 for her appt Dr Carles Collet said that we may reschedule her for 2/11 since she was seeing her med onc doctors in Nov. Pt rescheduled and info and callback # left on vm if this does not work for her.

## 2023-09-25 DIAGNOSIS — Z419 Encounter for procedure for purposes other than remedying health state, unspecified: Secondary | ICD-10-CM | POA: Diagnosis not present

## 2023-09-25 NOTE — Unmapped (Signed)
[facility] Refill Coordination Note    Specialty Medication(s) to be Shipped:   Hematology/Oncology: Capecitabine 500mg , directions: Take 4 tablets (2,000 mg total) by mouth two (2) times a day for 14 days on and 7 days off.    Other medication(s) to be shipped: No additional medications requested for fill at this time     Erin Gordon, DOB: 1976/06/18  Phone: [phone] (home)       All above HIPAA information was verified with patient.     Was a Nurse, learning disability used for this call? No    Completed refill call assessment today to schedule patient's medication shipment from the [facility]  [phone]).  All relevant notes have been reviewed.     Specialty medication(s) and dose(s) confirmed: Regimen is correct and unchanged.   Changes to medications: Laneya reports no changes at this time.  Changes to insurance: No  New side effects reported not previously addressed with a pharmacist or physician: None reported  Questions for the pharmacist: No    Confirmed patient received a Conservation officer, historic buildings and a Surveyor, mining with first shipment. The patient will receive a drug information handout for each medication shipped and additional FDA Medication Guides as required.       DISEASE/MEDICATION-SPECIFIC INFORMATION        N/A    SPECIALTY MEDICATION ADHERENCE     Medication Adherence    Patient reported X missed doses in the last month: 0  Specialty Medication: capecitabine 500 MG tablet (XELODA)  Patient is on additional specialty medications: No  Informant: patient              Were doses missed due to medication being on hold? No    Capecitabine 500 mg: 0 days of medicine on hand       REFERRAL TO PHARMACIST     Referral to the pharmacist: Not needed      [facility]     Shipping address confirmed in Epic.       Delivery Scheduled: Yes, Expected medication delivery date: 09/30/23.     Medication will be delivered via Next Day Courier to the prescription address in Epic WAM.    Erin Gordon   [facility]  Specialty Technician

## 2023-09-29 MED FILL — CAPECITABINE 500 MG TABLET: ORAL | 21 days supply | Qty: 112 | Fill #3

## 2023-09-30 ENCOUNTER — Ambulatory Visit: Admit: 2023-09-30 | Discharge: 2023-10-01 | Payer: PRIVATE HEALTH INSURANCE

## 2023-09-30 DIAGNOSIS — C50912 Malignant neoplasm of unspecified site of left female breast: Secondary | ICD-10-CM | POA: Diagnosis not present

## 2023-09-30 DIAGNOSIS — Z170 Estrogen receptor positive status [ER+]: Secondary | ICD-10-CM | POA: Diagnosis not present

## 2023-09-30 LAB — MANUAL DIFFERENTIAL
BASOPHILS - ABS (DIFF): 0 10*9/L (ref 0.0–0.1)
BASOPHILS - REL (DIFF): 1 %
EOSINOPHILS - ABS (DIFF): 0.1 10*9/L (ref 0.0–0.5)
EOSINOPHILS - REL (DIFF): 2 %
LYMPHOCYTES - ABS (DIFF): 0.8 10*9/L — ABNORMAL LOW (ref 1.1–3.6)
LYMPHOCYTES - REL (DIFF): 20 %
MONOCYTES - ABS (DIFF): 0.5 10*9/L (ref 0.3–0.8)
MONOCYTES - REL (DIFF): 13 %
NEUTROPHILS - ABS (DIFF): 2.5 10*9/L (ref 1.8–7.8)
NEUTROPHILS - REL (DIFF): 64 %

## 2023-09-30 LAB — CBC W/ AUTO DIFF
HEMATOCRIT: 35.9 % (ref 34.0–44.0)
HEMOGLOBIN: 11.8 g/dL (ref 11.3–14.9)
MEAN CORPUSCULAR HEMOGLOBIN CONC: 32.9 g/dL (ref 32.0–36.0)
MEAN CORPUSCULAR HEMOGLOBIN: 26.9 pg (ref 25.9–32.4)
MEAN CORPUSCULAR VOLUME: 81.8 fL (ref 77.6–95.7)
MEAN PLATELET VOLUME: 9 fL (ref 6.8–10.7)
PLATELET COUNT: 168 10*9/L (ref 150–450)
RED BLOOD CELL COUNT: 4.39 10*12/L (ref 3.95–5.13)
RED CELL DISTRIBUTION WIDTH: 14.8 % (ref 12.2–15.2)
WBC ADJUSTED: 4 10*9/L (ref 3.6–11.2)

## 2023-09-30 LAB — COMPREHENSIVE METABOLIC PANEL
ALBUMIN: 3.9 g/dL (ref 3.4–5.0)
ALKALINE PHOSPHATASE: 70 U/L (ref 46–116)
ALT (SGPT): 15 U/L (ref 10–49)
ANION GAP: 7 mmol/L (ref 5–14)
AST (SGOT): 19 U/L (ref <=34)
BILIRUBIN TOTAL: 0.3 mg/dL (ref 0.3–1.2)
BLOOD UREA NITROGEN: 11 mg/dL (ref 9–23)
BUN / CREAT RATIO: 16
CALCIUM: 9.5 mg/dL (ref 8.7–10.4)
CHLORIDE: 106 mmol/L (ref 98–107)
CO2: 27 mmol/L (ref 20.0–31.0)
CREATININE: 0.69 mg/dL
EGFR CKD-EPI (2021) FEMALE: >90 mL/min/{1.73_m2} (ref >=60)
GLUCOSE RANDOM: 98 mg/dL (ref 70–179)
POTASSIUM: 4 mmol/L (ref 3.4–4.8)
PROTEIN TOTAL: 7.6 g/dL (ref 5.7–8.2)
SODIUM: 140 mmol/L (ref 135–145)

## 2023-09-30 LAB — LUTEINIZING HORMONE: LUTEINIZING HORMONE: 38.6 m[IU]/mL

## 2023-09-30 LAB — CORTISOL: CORTISOL TOTAL: 7.4 ug/dL

## 2023-09-30 LAB — FOLLICLE STIMULATING HORMONE: FOLLICLE STIMULATING HORMONE: 79.2 m[IU]/mL

## 2023-09-30 NOTE — Unmapped (Signed)
Breast Oncology Return Patient Evaluation  Referring Physician: Pcp, None Per Patient  [address]  [place],  [place] [address].  PCP: Jeanie Cooks, MD    Cancer Team  Surgical Oncology: Sherilyn Cooter, DO  Surgical Oncology NP: Pollyann Samples, NP and Sonda Primes, NP  Radiation Oncology: Rogelio Seen, MD  Medical Oncology: Adonis Brook, MD  Plastic Surgery: None at this time  Genetics: None at this time Blood drawn today.    Reason for Visit: A 47 y.o. female with breast cancer referred for consultation for recommendations concerning the management of breast cancer.    Cancer stage   Cancer Staging   Malignant neoplasm of left breast in female, estrogen receptor positive (CMS-HCC)  Staging form: Breast, AJCC 8th Edition  - Clinical stage from 03/11/2022: Stage IIIB (cT2, cN2(f), cM0, G3, ER+, PR-, HER2-) - Signed by Talbert Cage, DO on 03/12/2022  - Pathologic stage from 12/11/2022: No Stage Recommended (ypT3, pN2a, G3, ER+, PR-, HER2-) - Signed by Rudie Meyer, ANP on 12/11/2022      Erin Gordon is a 47 year old lady recently dx with left breast cancer.     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 She was randomized to no upfront RT boost.  She has had multiple delays in her chemotherapy treatment for various reasons. Including infection and cytopenias. She completed last dose on 10/27/2022.     She is s/p left breast lumpectomy and axillary LN dissection of axillary staging on 11/27/2022 with 9 of 28 lymph nodes positive for macro metastatic carcinoma with the size of the largest tumor deposit 33 mm, treatment effect identified in multiple lymph nodes, extensive lymphovascular space invasion, and extranodal extension present less than 2 mm.  Residual left breast invasive ductal carcinoma with tumor size 64 mm in greatest dimension ER+(2%), PR -, HER2- (IHC 1+). She has Residual Cancer Burden Class: RCB-III     Adjuvant systemic therapy:  -Previously discussed option of enrolling in the ASCENT-05/OptimICE-RD (AFT-65): Phase 3, randomized, open-label study of adjuvant sacituzumab govitecan + pembrolizumab (pembro) vs pembro ?? capecitabine in patients with triple-negative breast cancer and residual disease. After meeting with study coordinator she declined.   -Proceeded with SOC using Capecitabine while continuing Pembrolizumab every 6 weeks.     Capecitabine 2000 mg po BID x 14 days on and 7 days off x 6 months. Started on 04/27/2023.  She took an extra week off last cycle because she felt tired and had some peeling with hyperpigmentation. She is starting her last cycle this week (cycle 8). Also has two more cycles of pembrolizumab left, last cycle on 12/27.    Staging Studies  - Complete staging with CT c/a/p, NM Bone scan; shows no evidence of distant metastasis . There was indeterminate left supraclavicular measuring up to 1 cm and subcentimeter right axillary lymph nodes measuring up to 0.5 cm; not considered suspicious and would not change management; monitor in follow up. We were unable to obtain a biospy of the 9 mm oval mass in the left breast that was seen on MRI of breast; patient declined MRI guided breast biospy.   -Repeat staging scans as above after surgery completed showed no evidence of distant metastasis. 1.0 cm enlarged left internal mammary lymph node could be  reactive   -Repeat CT chest to follow up on internal mammary LN.     Endocrine therapy  Low ER expression on residual tumor, we will attempt adjuvant ET given her high risk disease. Clarify her last menstrual cycle timing. check FSH, LH and estradiol today. Plan for tamoxifen if premenopausal or perimenopausal and letrozole if post menopausal.       Imaging  -Bilateral MMG in October WNL, repeat left MMG in April    Fertility   -She reports hx of partial hysterectomy; reports she still has ovaries.   -She is sexual active; we reviewed chemotherapy precautions     Genetics:   - Genetic testing: STAT Panel plus ATM and CHEK2 sent to [facility] - Negative     Supportive Care  -Low ACTH, cortisol normal and patient overall asymptomatic - econsult to endocrinology.  - HTN: patient taking hydrochlorothiazide. BP  elevated today; advised continue to follow up with [facility] family practice  -Tobacco use we discussed smoking cessation; she is interested. Referral placed.  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 We sent a referral for breast sleeve with [facility] and see if insurance will cover.    RTC:  Last cycle of xeloda  -11/15 and 12/27 for adjuvant pembro  -CT chest 11/20  -1/8 appointment to discuss adjuvant ET    ---------------------------------------------------------------------------------------------------------------------------------------------    HPI: Erin Gordon is a 46 y.o. female who returns today for follow up and continuation of treatment for recently dx breast cancer .    Interval hx:   In general she is feeling well, she is taking capecitabine as prescribed and tolerating well.   She reports occasional diarrhea, denies abdominal cramping, no nausea and no vomiting.   She denies blood in stool and no dark stools  She denies fever, chills and no pain, no cough or shortness of breath.   She is currently a smoker but reports she has cut back significantly.      Regarding lymphedema   patient has tried and failed four weeks of conservative treatment including compression, elevation, & exercise.    Patient tried and failed a one time basic pump trial due to increased swelling in chest post demo.     In summary:   Erin Gordon first noticed a knot in her upper outer left breast around 4-5 months ago (~ Dec 2022) She says she used to have boils on her breast when she was younger and says they would pop up, drain, and go away. She figured that this was a similar event, but became concerned when the lesion did not resolve.     She underwent diagnostic imaging on 02/12/22, which revealed a 3.2 x 1.8 x 2.4 cm irregular cystic and solid mass at the 6:00 5 CFN of the left breast. Echogenic foci within the mass likely correspond to pleomorphic calcifications seen on mammography. Additionally, there is a 1.0 x 0.5 x 0.7 cm hypoechoic circumscribed mass with internal vascularity at the 6:00 2 CFN of the left breast. Targeted ultrasound of the left axilla completed on 03/03/22 demonstrates multiple abnormally enlarged lymph nodes, largest demonstrating a cortical thickness of 1.9 cm. Lastly,  in the right breast 9:00 retroareolar there is a 0.6 x 0.4 x  0.5 cm irregular hypoechoic mass. Subsequent biopsy of the 6 o'clock 5 CFN left breast lesion and axillary lesion revealed IDC, G3, ER+(2%), ER-, Her2-(1+). Biopsy of the 6 o'clock 2 CFN left breast lesion and right breast retroareolar lesion demonstrated benign breast tissue with stromal fibrosis and apocrine cysts.   She is now s/p Neoadjuvant chemotherapy and partial mastectomy; completing  adjuvant radiation next week.      Performance status= ECOG 0    Breast Cancer History  Hematology/Oncology History Overview Note   2023: Left breast cT2 cN2 IDC, G3, weakly+2%/-/-     Malignant neoplasm of left breast in female, estrogen receptor positive (CMS-HCC)   02/12/2022 -  Presenting Symptoms    Self palpated left breast mass    MMG/US: Left breast 6:00 5 CFN there is a 3.2 x 1.8 x 2.4 cm irregular cystic and solid mass. Echogenic foci within the mass likely correspond to pleomorphic calcifications seen on mammography.  Left breast 6:00 2 CFN there is a 1.0 x 0.5 x 0.7 cm hypoechoic circumscribed mass with internal vascularity.  Left axilla there is multiple abnormally enlarged lymph nodes, largest demonstrating a cortical thickness of 1.9 cm.  Right breast 9:00 retroareolar there is  a 0.6 x 0.4 x  0.5 cm irregular hypoechoic mass.     03/03/2022 Biopsy    Left breast core biopsy, 6:00 5 CFN: IDC, G3, ER+(2%), ER-, Her2-(1+).    Left breast core biopsy, 6:00 2 CFN: benign breast tissue with stromal fibrosis and apocrine cysts.    Left axilla core biopsy: IDC, G3, ER+(2%), ER-, Her2-(1+).    Right breast core biopsy, retroareolar: benign breast tissue with stromal fibrosis and apocrine cysts.     03/11/2022 Initial Diagnosis    Malignant neoplasm of left breast in female, estrogen receptor positive (CMS-HCC)     03/11/2022 -  Cancer Staged    Staging form: Breast, AJCC 8th Edition  - Clinical stage from 03/11/2022: Stage IIIB (cT2, cN2(f), cM0, G3, ER+, PR-, HER2-) - Signed by Talbert Cage, DO on 03/12/2022       03/12/2022 Tumor Board    MDC recs: Left breast cT2 N1 IDC, G3, ER+(2%), PR-, HER2-  Imaging review with 3.2 cm area bx proven IDC and multiple (>7) enlarged axillary LNs. Additional left breast bx benign and concordant. Right breast bx benign and concordant.  NACT given locally advanced, schedule with med/onc add on next week  Staging studies  Possible PRAD trial candidate  Genetics: Meets GT criteria based on dx br cancer <50yo. Stat breast panel + ATM + CHEK2 and econsult     03/25/2022 Interval Scan(s)      MRI bilateral breast completed revealed 3.7 cm transverse by 3.3 cm AP by 2.6 cm craniocaudal. It demonstrates central necrosis with peripheral nodular enhancement. There is also an oval mass measuring 9 mm in the left breast, this mass has not been biopsied. However biopsied was recommended since patient is considering breast conservation surgery post chemotherapy. Patient was scheduled for biopsy of the 9 mm mass, but ultimately declined biopsy due to claustrophobia.     04/23/2022 -  Chemotherapy    STUDY TBCRC-053 IRB# [id number] NEOADJUVANT PEMBROLIZUMAB +/- RT WITH PACLItaxel / CARBOplatin FOLLOWED BY ddAC (v. 05/16/21)  P-RAD: A Randomized Study of Preoperative Chemotherapy, Pembrolizumab and No, Low or High Dose RADiation in Node-Positive, HER2-Negative Breast Cancer     12/11/2022 -  Cancer Staged    Staging form: Breast, AJCC 8th Edition  - Pathologic stage from 12/11/2022: No Stage Recommended (ypT3, pN2a, G3, ER+, PR-, HER2-) - Signed by Rudie Meyer, ANP on 12/11/2022       Malignant neoplasm of left breast in female, estrogen receptor positive, unspecified site of breast (CMS-HCC)   03/25/2022 Interval Scan(s)      MRI bilateral breast completed revealed 3.7 cm transverse by 3.3 cm AP by 2.6 cm craniocaudal. It demonstrates central necrosis with peripheral nodular enhancement. There is also an oval mass measuring 9 mm in the left breast, this mass has not been biopsied. However biopsied was recommended since patient is considering breast conservation surgery post chemotherapy. Patient was scheduled for biopsy of the 9 mm mass, but ultimately declined biopsy due to claustrophobia.     03/26/2022 Initial Diagnosis    Malignant neoplasm of left breast in female, estrogen receptor positive, unspecified site of breast (CMS-HCC)     03/26/2022 - 06/08/2022 Radiation    Radiation Therapy Treatment Details (03/26/2022 - 06/08/2022)  Site: Right Breast  Technique: IMRT  Goal: Curative  Planned Treatment Start Date: No planned start date specified     01/01/2023 -  Radiation    Radiation Therapy Treatment Details (Noted on 01/01/2023)  Site: Left Breast with lymph nodes  Technique: VMAT  Goal: No goal specified  Planned Treatment Start Date: No planned start date specified         Past Medical History  Past Medical History:   Diagnosis Date    Hypertension     Malignant neoplasm of left breast in female, estrogen receptor positive (CMS-HCC) 03/11/2022     HTN - stopped taking hydrochlorothiazide. Goes to [facility] family practice.    GERD - taking PRN PPI  Anxiety - not treated        Reproductive/GYN History  OB History   Gravida Para Term Preterm AB Living   4 4 4  0 0 0   SAB IAB Ectopic Molar Multiple Live Births   0 0 0 0 0 0      # Outcome Date GA Lbr Len/2nd Weight Sex Type Anes PTL Lv   4 Term            3 Term            2 Term            1 Term                Surgical History  Past Surgical History:   Procedure Laterality Date    BREAST BIOPSY      BREAST LUMPECTOMY Left 11/27/2022    CHEMOTHERAPY      HYSTERECTOMY      IR INSERT PORT AGE GREATER THAN 5 YRS  04/23/2022    IR INSERT PORT AGE GREATER THAN 5 YRS 04/23/2022 Dorene Ar, PA IMG VIR [facility]    PR INTRAOPERATIVE SENTINEL LYMPH NODE ID W DYE INJECTION Left 11/27/2022    Procedure: INTRAOPERATIVE IDENTIFICATION SENTINEL LYMPH NODE(S) INCLUDE INJECTION NON-RADIOACTIVE DYE, WHEN PERFORMED;  Surgeon: Talbert Cage, DO;  Location: OR [facility];  Service: Surgical Oncology Breast    PR MASTECTOMY, PARTIAL Left 11/27/2022    Procedure: MASTECTOMY, PARTIAL (EG, LUMPECTOMY, TYLECTOMY, QUADRANTECTOMY, SEGMENTECTOMY);  Surgeon: Talbert Cage, DO;  Location: OR [facility];  Service: Surgical Oncology Breast    PR REMOVE ARMPITS LYMPH NODES COMPLT Left 11/27/2022    Procedure: AXILLARY LYMPHADENECTOMY; COMPLETE;  Surgeon:  Talbert Cage, DO;  Location: OR [facility];  Service: Surgical Oncology Breast    RADIATION         Medications    Current Outpatient Medications:     albuterol HFA 90 mcg/actuation inhaler, Inhale 2 puffs every six (6) hours as needed for wheezing., Disp: 8 g, Rfl: 2    aspirin 325 MG tablet, Take 1 tablet (325 mg total) by mouth daily., Disp: , Rfl:     capecitabine (XELODA) 500 MG tablet, Take 4 tablets (2,000 mg total) by mouth two (2) times a day for 14 days on and 7 days off., Disp: 112 tablet, Rfl: 3    erythromycin (ROMYCIN) 5 mg/gram (0.5 %) ophthalmic ointment, Place a 1/2 inch ribbon of ointment into the lower eyelid., Disp: 3.5 g, Rfl: 0    hydrocortisone (ANUSOL-HC) 2.5 % rectal cream, Insert into the rectum two (2) times a day., Disp: 30 g, Rfl: 5    losartan (COZAAR) 50 MG tablet, Take 1 tablet (50 mg total) by mouth daily. (Patient not taking: Reported on 07/08/2023), Disp: 90 tablet, Rfl: 3    mometasone (ELOCON) 0.1 % cream, Apply to affected area daily, Disp: 90 g, Rfl: 1    omeprazole (PRILOSEC) 40 MG capsule, Take 1 capsule (40 mg total) by mouth daily., Disp: 30 capsule, Rfl: 3    oxybutynin (DITROPAN) 5 MG tablet, Take 0.5-1 tablets (2.5-5 mg total) by mouth Three (3) times a day as needed. For hot flashes, Disp: 90 tablet, Rfl: 11    silver sulfADIAZINE (SILVADENE, SSD) 1 % cream, Apply to affected area daily, Disp: 50 g, Rfl: 1    traMADol (ULTRAM) 50 mg tablet, Take 1 tablet (50 mg total) by mouth every six (6) hours as needed for pain., Disp: 30 tablet, Rfl: 0    Allergies  No Known Allergies    Personal and Social History  Social History     Social History Narrative    The patient is committed relationship.  She works as Scientist, research (medical). She has four children.   Lives in [place], [place] with her long-term boyfriend, Shon Baton, and two of her children. She has 4 children and now 5 grand children aged 55mo-25yrs. 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 Loves spending time at [employer]. Has been to the beach around 5 times the past year including [address], [address]     Smoking 1ppd x 10-15 years. Has quit before.  MJ smoking 1-4 joints per day  No cocaine, heroin, no IVDU  EtOH socially 2-3x weekly      Family History  Family History   Problem Relation Age of Onset    Diabetes Mother     Hypertension Mother     Parkinsonism Father     No Known Problems Sister     No Known Problems Daughter     No Known Problems Maternal Grandmother     No Known Problems Maternal Grandfather     Ovarian cancer Paternal Grandmother 45    Cancer Paternal Uncle         colon or prostate ?age    Breast cancer Paternal Cousin 15        recent dx, limited cntact    Breast cancer Other         3x paternal great aunts (PGF sisters) with breast ca ?ages; there may be other more distant paternal cousins w/ bresat ca    BRCA 1/2 Neg Hx  Colon cancer Neg Hx     Endometrial cancer Neg Hx    Parents still alive. Mother with DM. Father with parkinsons.   Uncle (paternal) cancer  Uncle (paternal) prostate  MGF colon cancer  Paternal aunt with breast cancer      Review of Systems: A 12-system review of systems was obtained including: Constitutional, Eyes, ENT, Cardiovascular, Respiratory, GI, GU, Musculoskeletal, Skin, Neurological, Psychiatric, Endocrine, Heme/Lymphatic, and Allergic/Immunologic systems. It is negative or non-contributory to the patient???s management except for the following: See Interval History.    Physical Examination:  Vital Signs: There were no vitals taken for this visit.  General:  Healthy-appearing female in no acute distress..  Cardiovasc:  No heaves, regular, no additional sounds. No lower extremity edema.    Respiratory:  Chest clear to percussion and auscultation, unlabored. Implanted port located in upper right chest, site clean, dry no swelling or erythema.  Gastrointestinal:  Soft, nontender, no hepatomegaly.   Musculoskeletal:  No bony pain or tenderness.   Skin and Subcutaneous Tissues:  Negative. No rash, ecchymoses, purpuric lesions.  Psychiatric: Mood is normal. Appropriately anxious.  No other symptoms.   Neuro:  Alert and oriented.  Gait and coordination normal  Upper Extremity Lymphedema: None.   Breasts: hyperpigmented areas on left chest wall post RT. left axillary incision is well healed no swelling or erythema with stitch in placed. Left breast full compared to right c/w lymphedema.   GU: No obvious lesions, discharge, erythema or other discoloration, ingrown hairs or skin breakdown. 1 incredibly small area of dry skin on R buttock. No signs of HS. No external hemorrhoids. No tenderness to palpation.     I have personally reviewed the following diagnostic studies:  Lab Results   Component Value Date    WBC 3.9 08/28/2023    HGB 10.6 (L) 08/28/2023    HCT 32.1 (L) 08/28/2023    PLT 154 08/28/2023    CHOL 181 06/03/2021    TRIG 82 06/03/2021    HDL 43 06/03/2021    ALT 15 08/28/2023    AST 26 08/28/2023    NA 142 08/28/2023    K 4.2 08/28/2023    CL 110 (H) 08/28/2023    CREATININE 0.60 08/28/2023    BUN 12 08/28/2023    CO2 26.9 08/28/2023    TSH 0.852 08/28/2023    INR 0.94 05/07/2022

## 2023-10-01 DIAGNOSIS — R7989 Other specified abnormal findings of blood chemistry: Principal | ICD-10-CM

## 2023-10-01 NOTE — Unmapped (Signed)
RN called and spoke to patient about moving her infusion up to 11.15 per her nurse navigators request to match with her 6 week time frame.  Patient stated [birth year] is her [birth year] and she has plans to go out of town, that's why she made her next appt on 11.22.  RN explained to patient she would let her team know and will not change any of her appointments.

## 2023-10-01 NOTE — Unmapped (Signed)
Thank you for your Endocrinology e-Consult.    To summarize: Erin Gordon is a 47 y.o. female  has a past medical history of Hypertension and Malignant neoplasm of left breast in female, estrogen receptor positive (CMS-HCC) (03/11/2022)..  This econsult is regarding  low cortisol .      Pembrolizumab since 03/2022. Every 6 weeks. Most recent dose 08/28/23.      Patient asymptomatic.       Latest Reference Range & Units 07/08/23 11:52 08/28/23 12:53 09/30/23 16:06   Cortisol See Comment ug/dL  5.2 7.4   ACTH pg/mL  5.2 (L)    FSH mIU/mL   79.2   LH mIU/mL   38.6   TSH 0.550 - 4.780 uIU/mL 1.035 0.852    Free T4 0.89 - 1.76 ng/dL 6.04     (L): Data is abnormally low        Patient Active Problem List   Diagnosis    Hypertension    Tobacco abuse    Gastroesophageal reflux disease    Abnormal EKG    Atypical chest pain    Polysubstance abuse (CMS-HCC)    Class 2 severe obesity due to excess calories with serious comorbidity in adult (CMS-HCC)    Mass of lower outer quadrant of left breast    Mass of left axilla    Malignant neoplasm of left breast in female, estrogen receptor positive (CMS-HCC)    Malignant neoplasm of left breast in female, estrogen receptor positive, unspecified site of breast (CMS-HCC)    Hypomagnesemia         Current Outpatient Medications:     albuterol HFA 90 mcg/actuation inhaler, Inhale 2 puffs every six (6) hours as needed for wheezing., Disp: 8 g, Rfl: 2    aspirin 325 MG tablet, Take 1 tablet (325 mg total) by mouth daily., Disp: , Rfl:     capecitabine (XELODA) 500 MG tablet, Take 4 tablets (2,000 mg total) by mouth two (2) times a day for 14 days on and 7 days off., Disp: 112 tablet, Rfl: 3    erythromycin (ROMYCIN) 5 mg/gram (0.5 %) ophthalmic ointment, Place a 1/2 inch ribbon of ointment into the lower eyelid., Disp: 3.5 g, Rfl: 0    hydrocortisone (ANUSOL-HC) 2.5 % rectal cream, Insert into the rectum two (2) times a day., Disp: 30 g, Rfl: 5    losartan (COZAAR) 50 MG tablet, Take 1 tablet (50 mg total) by mouth daily. (Patient not taking: Reported on 07/08/2023), Disp: 90 tablet, Rfl: 3    mometasone (ELOCON) 0.1 % cream, Apply to affected area daily, Disp: 90 g, Rfl: 1    omeprazole (PRILOSEC) 40 MG capsule, Take 1 capsule (40 mg total) by mouth daily., Disp: 30 capsule, Rfl: 3    oxybutynin (DITROPAN) 5 MG tablet, Take 0.5-1 tablets (2.5-5 mg total) by mouth Three (3) times a day as needed. For hot flashes, Disp: 90 tablet, Rfl: 11    silver sulfADIAZINE (SILVADENE, SSD) 1 % cream, Apply to affected area daily, Disp: 50 g, Rfl: 1    traMADol (ULTRAM) 50 mg tablet, Take 1 tablet (50 mg total) by mouth every six (6) hours as needed for pain., Disp: 30 tablet, Rfl: 0      My recommendations are as follows:     In asymptomatic patient and timing of labs (after 10am), do not suspect this represents ACTH deficiency from pembro so would hold on steroid treatment.     FSH and LH elevated, but this  does not rule out isolated ACTH deficiency.     She will remain at risk for hypophysititis on immunotherapy so should keep a close eye on symptoms (severe fatigue, nausea, lightheadedness, anorexia).  Ideally, time ACTH and cortisol draws prior to 10am going forward.      I spent 5-10 minutes in medical consultative discussion and review of medical records, including a written report to the treating provider via electronic health record regarding the condition of this patient.    This e-Consult did include an answerable clinical question and did not recommend a clinic visit.    Denzil Magnuson, MD     @[email or web address] @

## 2023-10-02 DIAGNOSIS — C50912 Malignant neoplasm of unspecified site of left female breast: Principal | ICD-10-CM

## 2023-10-02 DIAGNOSIS — Z170 Estrogen receptor positive status [ER+]: Principal | ICD-10-CM

## 2023-10-02 DIAGNOSIS — R7989 Other specified abnormal findings of blood chemistry: Principal | ICD-10-CM

## 2023-10-13 NOTE — Unmapped (Unsigned)
Breast Oncology Return Patient Evaluation  Referring Physician: Jeanie Cooks, Md  [address]  [facility] Hem/onc  [place],  [place] [address].  PCP: Jeanie Cooks, MD    Cancer Team  Surgical Oncology: Sherilyn Cooter, DO  Surgical Oncology NP: Pollyann Samples, NP and Sonda Primes, NP  Radiation Oncology: Rogelio Seen, MD  Medical Oncology: Adonis Brook, MD  Plastic Surgery: None at this time  Genetics: None at this time Blood drawn today.    Reason for Visit: A 47 y.o. female with breast cancer referred for consultation for recommendations concerning the management of breast cancer. ***    Cancer stage   Cancer Staging   Malignant neoplasm of left breast in female, estrogen receptor positive (CMS-HCC)  Staging form: Breast, AJCC 8th Edition  - Clinical stage from 03/11/2022: Stage IIIB (cT2, cN2(f), cM0, G3, ER+, PR-, HER2-) - Signed by Talbert Cage, DO on 03/12/2022  - Pathologic stage from 12/11/2022: No Stage Recommended (ypT3, pN2a, G3, ER+, PR-, HER2-) - Signed by Rudie Meyer, ANP on 12/11/2022      [name] is a 47 year old lady recently dx with left breast cancer.     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 She was randomized to no upfront RT boost.  She has had multiple delays in her chemotherapy treatment for various reasons. Including infection and cytopenias. She completed last dose on 10/27/2022.     She is s/p left breast lumpectomy and axillary LN dissection of axillary staging on 11/27/2022 with 9 of 28 lymph nodes positive for macro metastatic carcinoma with the size of the largest tumor deposit 33 mm, treatment effect identified in multiple lymph nodes, extensive lymphovascular space invasion, and extranodal extension present less than 2 mm.  Residual left breast invasive ductal carcinoma with tumor size 64 mm in greatest dimension ER+(2%), PR -, HER2- (IHC 1+). She has Residual Cancer Burden Class: RCB-III     Adjuvant systemic therapy:  -Previously discussed option of enrolling in the ASCENT-05/OptimICE-RD (AFT-65): Phase 3, randomized, open-label study of adjuvant sacituzumab govitecan + pembrolizumab (pembro) vs pembro ?? capecitabine in patients with triple-negative breast cancer and residual disease. After meeting with study coordinator she declined.   -Proceeded with SOC using Capecitabine while continuing Pembrolizumab every 6 weeks.     Capecitabine 2000 mg po BID x 14 days on and 7 days off x 6 months. Started on 04/27/2023.  She took an extra week off last cycle because she felt tired and had some peeling with hyperpigmentation. She is starting her last cycle this week (cycle 8). Also has two more cycles of pembrolizumab left, last cycle on 12/27.    Staging Studies  - Complete staging with CT c/a/p, NM Bone scan; shows no evidence of distant metastasis . There was indeterminate left supraclavicular measuring up to 1 cm and subcentimeter right axillary lymph nodes measuring up to 0.5 cm; not considered suspicious and would not change management; monitor in follow up. We were unable to obtain a biospy of the 9 mm oval mass in the left breast that was seen on MRI of breast; patient declined MRI guided breast biospy.   -Repeat staging scans as above after surgery completed showed no evidence of distant metastasis. 1.0 cm enlarged left internal mammary lymph node  could be reactive   -Repeat CT chest to follow up on internal mammary LN.     Endocrine therapy  Low ER expression on residual tumor, we will attempt adjuvant ET given her high risk disease. Clarify her last menstrual cycle timing. check FSH, LH and estradiol today. Plan for tamoxifen if premenopausal or perimenopausal and letrozole if post menopausal.     Imaging  -Bilateral MMG in October WNL, repeat left MMG in April    Fertility   -She reports hx of partial hysterectomy; reports she still has ovaries.   -She is sexual active; we reviewed chemotherapy precautions     Genetics:   - Genetic testing: STAT Panel plus ATM and CHEK2 sent to [facility] - Negative     Supportive Care  -Low ACTH, cortisol normal and patient overall asymptomatic - econsult to endocrinology.  - HTN: patient taking hydrochlorothiazide. BP  elevated today; advised continue to follow up with [facility] family practice  -Tobacco use we discussed smoking cessation; she is interested. Referral placed.  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 We sent a referral for breast sleeve with [facility] and see if insurance will cover.    RTC:  Last cycle of xeloda  -11/15 and 12/27 for adjuvant pembro  -CT chest 11/20  -1/8 appointment to discuss adjuvant ET    ---------------------------------------------------------------------------------------------------------------------------------------------    HPI: Erin Gordon is a 47 y.o. female who returns today for follow up and continuation of treatment for recently dx breast cancer .    Interval hx:   In general she is feeling well, she is taking capecitabine as prescribed and tolerating well.   She reports occasional diarrhea, denies abdominal cramping, no nausea and no vomiting.   She denies blood in stool and no dark stools  She denies fever, chills and no pain, no cough or shortness of breath.   She is currently a smoker but reports she has cut back significantly.      Regarding lymphedema   patient has tried and failed four weeks of conservative treatment including compression, elevation, & exercise.    Patient tried and failed a one time basic pump trial due to increased swelling in chest post demo.     In summary:   Ms. Wingrove first noticed a knot in her upper outer left breast around 4-5 months ago (~ Dec 2022) She says she used to have boils on her breast when she was younger and says they would pop up, drain, and go away. She figured that this was a similar event, but became concerned when the lesion did not resolve.     She underwent diagnostic imaging on 02/12/22, which revealed a 3.2 x 1.8 x 2.4 cm irregular cystic and solid mass at the 6:00 5 CFN of the left breast. Echogenic foci within the mass likely correspond to pleomorphic calcifications seen on mammography. Additionally, there is a 1.0 x 0.5 x 0.7 cm hypoechoic circumscribed mass with internal vascularity at the 6:00 2 CFN of the left breast. Targeted ultrasound of the left axilla completed on 03/03/22 demonstrates multiple abnormally enlarged lymph nodes, largest demonstrating a cortical thickness of 1.9 cm. Lastly,  in the right breast 9:00 retroareolar there is a 0.6 x 0.4 x  0.5 cm irregular hypoechoic mass. Subsequent biopsy of the 6 o'clock 5 CFN left breast lesion and axillary lesion revealed IDC, G3, ER+(2%), ER-, Her2-(1+). Biopsy of the 6 o'clock 2 CFN left breast lesion and right breast retroareolar lesion demonstrated benign breast tissue with stromal fibrosis and apocrine cysts.   She is now s/p Neoadjuvant chemotherapy and partial mastectomy; completing  adjuvant radiation next week.      Performance status= ECOG 0    Breast Cancer History  Hematology/Oncology History Overview Note   2023: Left breast cT2 cN2 IDC, G3, weakly+2%/-/-     Malignant neoplasm of left breast in female, estrogen receptor positive (CMS-HCC)   02/12/2022 -  Presenting Symptoms    Self palpated left breast mass    MMG/US: Left breast 6:00 5 CFN there is a 3.2 x 1.8 x 2.4 cm irregular cystic and solid mass. Echogenic foci within the mass likely correspond to pleomorphic calcifications seen on mammography.  Left breast 6:00 2 CFN there is a 1.0 x 0.5 x 0.7 cm hypoechoic circumscribed mass with internal vascularity.  Left axilla there is multiple abnormally enlarged lymph nodes, largest demonstrating a cortical thickness of 1.9 cm.  Right breast 9:00 retroareolar there is  a 0.6 x 0.4 x  0.5 cm irregular hypoechoic mass.     03/03/2022 Biopsy    Left breast core biopsy, 6:00 5 CFN: IDC, G3, ER+(2%), ER-, Her2-(1+).    Left breast core biopsy, 6:00 2 CFN: benign breast tissue with stromal fibrosis and apocrine cysts.    Left axilla core biopsy: IDC, G3, ER+(2%), ER-, Her2-(1+).    Right breast core biopsy, retroareolar: benign breast tissue with stromal fibrosis and apocrine cysts.     03/11/2022 Initial Diagnosis    Malignant neoplasm of left breast in female, estrogen receptor positive (CMS-HCC)     03/11/2022 -  Cancer Staged    Staging form: Breast, AJCC 8th Edition  - Clinical stage from 03/11/2022: Stage IIIB (cT2, cN2(f), cM0, G3, ER+, PR-, HER2-) - Signed by Talbert Cage, DO on 03/12/2022       03/12/2022 Tumor Board    MDC recs: Left breast cT2 N1 IDC, G3, ER+(2%), PR-, HER2-  Imaging review with 3.2 cm area bx proven IDC and multiple (>7) enlarged axillary LNs. Additional left breast bx benign and concordant. Right breast bx benign and concordant.  NACT given locally advanced, schedule with med/onc add on next week  Staging studies  Possible PRAD trial candidate  Genetics: Meets GT criteria based on dx br cancer <50yo. Stat breast panel + ATM + CHEK2 and econsult     03/25/2022 Interval Scan(s)      MRI bilateral breast completed revealed 3.7 cm transverse by 3.3 cm AP by 2.6 cm craniocaudal. It demonstrates central necrosis with peripheral nodular enhancement. There is also an oval mass measuring 9 mm in the left breast, this mass has not been biopsied. However biopsied was recommended since patient is considering breast conservation surgery post chemotherapy. Patient was scheduled for biopsy of the 9 mm mass, but ultimately declined biopsy due to claustrophobia.     04/23/2022 -  Chemotherapy    STUDY TBCRC-053 IRB# [id number] NEOADJUVANT PEMBROLIZUMAB +/- RT WITH PACLItaxel / CARBOplatin FOLLOWED BY ddAC (v. 05/16/21)  P-RAD: A Randomized Study of Preoperative Chemotherapy, Pembrolizumab and No, Low or High Dose RADiation in Node-Positive, HER2-Negative Breast Cancer     12/11/2022 -  Cancer Staged    Staging form: Breast, AJCC 8th Edition  - Pathologic stage from 12/11/2022: No Stage Recommended (ypT3, pN2a, G3, ER+, PR-, HER2-) - Signed by Rudie Meyer, ANP on 12/11/2022       Malignant neoplasm of left breast in female, estrogen receptor positive, unspecified site of breast (CMS-HCC)   03/25/2022 Interval Scan(s)      MRI bilateral breast completed revealed 3.7 cm transverse by 3.3 cm AP by 2.6 cm craniocaudal. It demonstrates central necrosis with peripheral nodular enhancement. There is also an oval mass measuring 9 mm in the left breast, this mass has not been biopsied. However biopsied was recommended since patient is considering breast conservation surgery post chemotherapy. Patient was scheduled for biopsy of the 9 mm mass, but ultimately declined biopsy due to claustrophobia.     03/26/2022 Initial Diagnosis    Malignant neoplasm of left breast in female, estrogen receptor positive, unspecified site of breast (CMS-HCC)     03/26/2022 - 06/08/2022 Radiation    Radiation Therapy Treatment Details (03/26/2022 - 06/08/2022)  Site: Right Breast  Technique: IMRT  Goal: Curative  Planned Treatment Start Date: No planned start date specified     01/01/2023 -  Radiation    Radiation Therapy Treatment Details (Noted on 01/01/2023)  Site: Left Breast with lymph nodes  Technique: VMAT  Goal: No goal specified  Planned Treatment Start Date: No planned start date specified         Past Medical History  Past Medical History:   Diagnosis Date    Hypertension     Malignant neoplasm of left breast in female, estrogen receptor positive (CMS-HCC) 03/11/2022     HTN - stopped taking hydrochlorothiazide. Goes to [facility] family practice.    GERD - taking PRN PPI  Anxiety - not treated        Reproductive/GYN History  OB History   Gravida Para Term Preterm AB Living   4 4 4  0 0 0   SAB IAB Ectopic Molar Multiple Live Births   0 0 0 0 0 0      # Outcome Date GA Lbr Len/2nd Weight Sex Type Anes PTL Lv   4 Term            3 Term            2 Term            1 Term                Surgical History  Past Surgical History:   Procedure Laterality Date    BREAST BIOPSY      BREAST LUMPECTOMY Left 11/27/2022    CHEMOTHERAPY      HYSTERECTOMY      IR INSERT PORT AGE GREATER THAN 5 YRS  04/23/2022    IR INSERT PORT AGE GREATER THAN 5 YRS 04/23/2022 Dorene Ar, PA IMG VIR [facility]    PR INTRAOPERATIVE SENTINEL LYMPH NODE ID W DYE INJECTION Left 11/27/2022    Procedure: INTRAOPERATIVE IDENTIFICATION SENTINEL LYMPH NODE(S) INCLUDE INJECTION NON-RADIOACTIVE DYE, WHEN PERFORMED;  Surgeon: Talbert Cage, DO;  Location: OR [facility];  Service: Surgical Oncology Breast    PR MASTECTOMY, PARTIAL Left 11/27/2022    Procedure: MASTECTOMY, PARTIAL (EG, LUMPECTOMY, TYLECTOMY, QUADRANTECTOMY, SEGMENTECTOMY);  Surgeon: Talbert Cage, DO;  Location: OR [facility];  Service: Surgical Oncology Breast    PR REMOVE ARMPITS LYMPH NODES COMPLT Left 11/27/2022    Procedure: AXILLARY LYMPHADENECTOMY; COMPLETE;  Surgeon:  Talbert Cage, DO;  Location: OR [facility];  Service: Surgical Oncology Breast    RADIATION         Medications    Current Outpatient Medications:     albuterol HFA 90 mcg/actuation inhaler, Inhale 2 puffs every six (6) hours as needed for wheezing., Disp: 8 g, Rfl: 2    aspirin 325 MG tablet, Take 1 tablet (325 mg total) by mouth daily., Disp: , Rfl:     capecitabine (XELODA) 500 MG tablet, Take 4 tablets (2,000 mg total) by mouth two (2) times a day for 14 days on and 7 days off., Disp: 112 tablet, Rfl: 3    erythromycin (ROMYCIN) 5 mg/gram (0.5 %) ophthalmic ointment, Place a 1/2 inch ribbon of ointment into the lower eyelid., Disp: 3.5 g, Rfl: 0    hydrocortisone (ANUSOL-HC) 2.5 % rectal cream, Insert into the rectum two (2) times a day., Disp: 30 g, Rfl: 5    losartan (COZAAR) 50 MG tablet, Take 1 tablet (50 mg total) by mouth daily. (Patient not taking: Reported on 07/08/2023), Disp: 90 tablet, Rfl: 3    mometasone (ELOCON) 0.1 % cream, Apply to affected area daily, Disp: 90 g, Rfl: 1    omeprazole (PRILOSEC) 40 MG capsule, Take 1 capsule (40 mg total) by mouth daily., Disp: 30 capsule, Rfl: 3    oxybutynin (DITROPAN) 5 MG tablet, Take 0.5-1 tablets (2.5-5 mg total) by mouth Three (3) times a day as needed. For hot flashes, Disp: 90 tablet, Rfl: 11    silver sulfADIAZINE (SILVADENE, SSD) 1 % cream, Apply to affected area daily, Disp: 50 g, Rfl: 1    traMADol (ULTRAM) 50 mg tablet, Take 1 tablet (50 mg total) by mouth every six (6) hours as needed for pain., Disp: 30 tablet, Rfl: 0    Allergies  No Known Allergies    Personal and Social History  Social History     Social History Narrative    The patient is committed relationship.  She works as Scientist, research (medical). She has four children.   Lives in [place], [place] with her long-term boyfriend, Shon Baton, and two of her children. She has 4 children and now 5 grand children aged 40mo-79yrs. 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 Loves spending time at [employer]. Has been to the beach around 5 times the past year including [address], [address]     Smoking 1ppd x 10-15 years. Has quit before.  MJ smoking 1-4 joints per day  No cocaine, heroin, no IVDU  EtOH socially 2-3x weekly      Family History  Family History   Problem Relation Age of Onset    Diabetes Mother     Hypertension Mother     Parkinsonism Father     No Known Problems Sister     No Known Problems Daughter     No Known Problems Maternal Grandmother     No Known Problems Maternal Grandfather     Ovarian cancer Paternal Grandmother 64    Cancer Paternal Uncle         colon or prostate ?age    Breast cancer Paternal Cousin 109        recent dx, limited cntact    Breast cancer Other         3x paternal great aunts (PGF sisters) with breast ca ?ages; there may be other more distant paternal cousins w/ bresat ca    BRCA 1/2 Neg Hx  Colon cancer Neg Hx     Endometrial cancer Neg Hx    Parents still alive. Mother with DM. Father with parkinsons.   Uncle (paternal) cancer  Uncle (paternal) prostate  MGF colon cancer  Paternal aunt with breast cancer      Review of Systems: A 12-system review of systems was obtained including: Constitutional, Eyes, ENT, Cardiovascular, Respiratory, GI, GU, Musculoskeletal, Skin, Neurological, Psychiatric, Endocrine, Heme/Lymphatic, and Allergic/Immunologic systems. It is negative or non-contributory to the patient???s management except for the following: See Interval History.    Physical Examination:  Vital Signs: There were no vitals taken for this visit.  General:  Healthy-appearing female in no acute distress..  Cardiovasc:  No heaves, regular, no additional sounds. No lower extremity edema.    Respiratory:  Chest clear to percussion and auscultation, unlabored. Implanted port located in upper right chest, site clean, dry no swelling or erythema.  Gastrointestinal:  Soft, nontender, no hepatomegaly.   Musculoskeletal:  No bony pain or tenderness.   Skin and Subcutaneous Tissues:  Negative. No rash, ecchymoses, purpuric lesions.  Psychiatric: Mood is normal. Appropriately anxious.  No other symptoms.   Neuro:  Alert and oriented.  Gait and coordination normal  Upper Extremity Lymphedema: None.   Breasts: hyperpigmented areas on left chest wall post RT. left axillary incision is well healed no swelling or erythema with stitch in placed. Left breast full compared to right c/w lymphedema.   GU: No obvious lesions, discharge, erythema or other discoloration, ingrown hairs or skin breakdown. 1 incredibly small area of dry skin on R buttock. No signs of HS. No external hemorrhoids. No tenderness to palpation.     I have personally reviewed the following diagnostic studies:  Lab Results   Component Value Date    WBC 4.0 09/30/2023    HGB 11.8 09/30/2023    HCT 35.9 09/30/2023    PLT 168 09/30/2023    CHOL 181 06/03/2021    TRIG 82 06/03/2021    HDL 43 06/03/2021    ALT 15 09/30/2023    AST 19 09/30/2023    NA 140 09/30/2023    K 4.0 09/30/2023    CL 106 09/30/2023    CREATININE 0.69 09/30/2023    BUN 11 09/30/2023    CO2 27.0 09/30/2023    TSH 0.852 08/28/2023    INR 0.94 05/07/2022

## 2023-10-14 ENCOUNTER — Ambulatory Visit: Admit: 2023-10-14 | Discharge: 2023-10-15 | Payer: PRIVATE HEALTH INSURANCE

## 2023-10-14 DIAGNOSIS — Z170 Estrogen receptor positive status [ER+]: Secondary | ICD-10-CM | POA: Diagnosis not present

## 2023-10-14 DIAGNOSIS — C50912 Malignant neoplasm of unspecified site of left female breast: Secondary | ICD-10-CM | POA: Diagnosis not present

## 2023-10-14 MED ADMIN — iohexol (OMNIPAQUE) 350 mg iodine/mL solution 75 mL: 75 mL | INTRAVENOUS | @ 19:00:00 | Stop: 2023-10-14

## 2023-10-15 DIAGNOSIS — C50912 Malignant neoplasm of unspecified site of left female breast: Principal | ICD-10-CM

## 2023-10-15 DIAGNOSIS — Z170 Estrogen receptor positive status [ER+]: Principal | ICD-10-CM

## 2023-10-15 NOTE — Unmapped (Signed)
[facility]Oper Other Call ONC Phone Room Smart Lists: Results -     Hi,     Erin Gordon contacted the Communication Center requesting results of the following:     Procedure: CT Scan  Completed On: 10/14/2023    Please contact Erin Gordon at [phone] for proper follow up.    Thank you,   Iona Hansen  [facility] Communication [facility]   [phone]    [facility]

## 2023-10-15 NOTE — Unmapped (Signed)
Patient has completed therapy, will forward information to pharmacist.

## 2023-10-15 NOTE — Unmapped (Signed)
Specialty Medication(s): capecitabine    Erin Gordon has been dis-enrolled from the [employer] [facility] specialty pharmacy services due to therapy completion - expected therapy completion date: Nov 2024 .    Additional information provided to the patient: no    Rollen Sox, [facility]  [facility] and [facility] Specialty Pharmacist

## 2023-10-15 NOTE — Unmapped (Signed)
Returned call to pt. Let her know per BJYNWGN NP: let her know that it looks like no concerns for malignancy on CT chest and I will discuss the complete results with her tomorrow when she in in clinic.     Pt was appreciative of the call and agreeable to the plan.Will call back with any further needs.

## 2023-10-16 ENCOUNTER — Institutional Professional Consult (permissible substitution): Admit: 2023-10-16 | Discharge: 2023-10-17 | Payer: PRIVATE HEALTH INSURANCE

## 2023-10-16 ENCOUNTER — Ambulatory Visit: Admit: 2023-10-16 | Discharge: 2023-10-17 | Payer: PRIVATE HEALTH INSURANCE

## 2023-10-16 DIAGNOSIS — C50912 Malignant neoplasm of unspecified site of left female breast: Secondary | ICD-10-CM | POA: Diagnosis not present

## 2023-10-16 DIAGNOSIS — Z5112 Encounter for antineoplastic immunotherapy: Secondary | ICD-10-CM | POA: Diagnosis not present

## 2023-10-16 DIAGNOSIS — Z170 Estrogen receptor positive status [ER+]: Secondary | ICD-10-CM | POA: Diagnosis not present

## 2023-10-16 DIAGNOSIS — Z79899 Other long term (current) drug therapy: Secondary | ICD-10-CM | POA: Diagnosis not present

## 2023-10-16 DIAGNOSIS — I10 Essential (primary) hypertension: Secondary | ICD-10-CM | POA: Diagnosis not present

## 2023-10-16 DIAGNOSIS — R911 Solitary pulmonary nodule: Secondary | ICD-10-CM | POA: Diagnosis not present

## 2023-10-24 DIAGNOSIS — C50912 Malignant neoplasm of unspecified site of left female breast: Principal | ICD-10-CM

## 2023-10-24 DIAGNOSIS — Z170 Estrogen receptor positive status [ER+]: Principal | ICD-10-CM

## 2023-10-25 DIAGNOSIS — Z419 Encounter for procedure for purposes other than remedying health state, unspecified: Secondary | ICD-10-CM | POA: Diagnosis not present

## 2023-10-26 ENCOUNTER — Ambulatory Visit
Admit: 2023-10-26 | Discharge: 2023-11-24 | Payer: PRIVATE HEALTH INSURANCE | Attending: Radiation Oncology | Primary: Radiation Oncology

## 2023-10-26 ENCOUNTER — Ambulatory Visit
Admit: 2023-10-26 | Discharge: 2023-11-12 | Payer: PRIVATE HEALTH INSURANCE | Attending: Radiation Oncology | Primary: Radiation Oncology

## 2023-10-26 ENCOUNTER — Ambulatory Visit
Admit: 2023-10-26 | Discharge: 2023-11-10 | Payer: PRIVATE HEALTH INSURANCE | Attending: Radiation Oncology | Primary: Radiation Oncology

## 2023-10-26 ENCOUNTER — Ambulatory Visit: Admit: 2023-10-26 | Payer: PRIVATE HEALTH INSURANCE | Attending: Radiation Oncology | Primary: Radiation Oncology

## 2023-10-26 ENCOUNTER — Ambulatory Visit
Admit: 2023-10-26 | Discharge: 2023-11-14 | Payer: PRIVATE HEALTH INSURANCE | Attending: Radiation Oncology | Primary: Radiation Oncology

## 2023-11-01 ENCOUNTER — Other Ambulatory Visit: Payer: Self-pay

## 2023-11-01 ENCOUNTER — Encounter: Payer: Self-pay | Admitting: Emergency Medicine

## 2023-11-01 ENCOUNTER — Ambulatory Visit
Admit: 2023-11-01 | Discharge: 2023-11-01 | Disposition: A | Discharge status: Home / Self Care | Payer: PRIVATE HEALTH INSURANCE | Attending: Emergency Medicine

## 2023-11-01 DIAGNOSIS — Z79899 Other long term (current) drug therapy: Secondary | ICD-10-CM | POA: Diagnosis not present

## 2023-11-01 DIAGNOSIS — R253 Fasciculation: Secondary | ICD-10-CM | POA: Diagnosis present

## 2023-11-01 DIAGNOSIS — C50912 Malignant neoplasm of unspecified site of left female breast: Secondary | ICD-10-CM | POA: Diagnosis not present

## 2023-11-01 DIAGNOSIS — F17210 Nicotine dependence, cigarettes, uncomplicated: Secondary | ICD-10-CM | POA: Insufficient documentation

## 2023-11-01 DIAGNOSIS — R569 Unspecified convulsions: Secondary | ICD-10-CM | POA: Insufficient documentation

## 2023-11-01 DIAGNOSIS — Z7982 Long term (current) use of aspirin: Secondary | ICD-10-CM | POA: Diagnosis not present

## 2023-11-01 DIAGNOSIS — R220 Localized swelling, mass and lump, head: Secondary | ICD-10-CM | POA: Diagnosis not present

## 2023-11-01 DIAGNOSIS — G40109 Localization-related (focal) (partial) symptomatic epilepsy and epileptic syndromes with simple partial seizures, not intractable, without status epilepticus: Secondary | ICD-10-CM | POA: Diagnosis not present

## 2023-11-01 DIAGNOSIS — G939 Disorder of brain, unspecified: Secondary | ICD-10-CM | POA: Diagnosis not present

## 2023-11-01 DIAGNOSIS — I10 Essential (primary) hypertension: Secondary | ICD-10-CM | POA: Diagnosis not present

## 2023-11-01 DIAGNOSIS — R202 Paresthesia of skin: Secondary | ICD-10-CM | POA: Diagnosis not present

## 2023-11-01 DIAGNOSIS — C7931 Secondary malignant neoplasm of brain: Secondary | ICD-10-CM | POA: Insufficient documentation

## 2023-11-01 DIAGNOSIS — C50919 Malignant neoplasm of unspecified site of unspecified female breast: Secondary | ICD-10-CM | POA: Diagnosis not present

## 2023-11-01 DIAGNOSIS — Z743 Need for continuous supervision: Secondary | ICD-10-CM | POA: Diagnosis not present

## 2023-11-01 DIAGNOSIS — Z87891 Personal history of nicotine dependence: Secondary | ICD-10-CM | POA: Diagnosis not present

## 2023-11-01 NOTE — ED Notes (Addendum)
First Nurse note: pt BIB ems with c/o seizure lasting appx 1 minute.  Per ems, seizure was localized to left side of face.  Ems states pt has no hx of seizures, and was seen at [facility] today for same.  Per note at [facility], pt declined CT scan at that facility.

## 2023-11-01 NOTE — ED Triage Notes (Signed)
Patient reports facial twitching that started at 0800 this morning.  Patient reports it has went away, and she was seen at [facility] and had labs drawn but no CT and was discharged.  Patient denies any other symptoms, patient's face not currently twitching.

## 2023-11-02 ENCOUNTER — Emergency Department: Payer: Medicaid Other

## 2023-11-02 ENCOUNTER — Emergency Department
Admission: EM | Admit: 2023-11-02 | Discharge: 2023-11-02 | Disposition: A | Discharge status: Home / Self Care | Payer: Medicaid Other | Attending: Emergency Medicine | Admitting: Emergency Medicine

## 2023-11-02 ENCOUNTER — Encounter: Payer: Self-pay | Admitting: Radiology

## 2023-11-02 DIAGNOSIS — R569 Unspecified convulsions: Secondary | ICD-10-CM | POA: Diagnosis not present

## 2023-11-02 DIAGNOSIS — C7931 Secondary malignant neoplasm of brain: Secondary | ICD-10-CM | POA: Diagnosis not present

## 2023-11-02 DIAGNOSIS — C50912 Malignant neoplasm of unspecified site of left female breast: Secondary | ICD-10-CM

## 2023-11-02 DIAGNOSIS — G939 Disorder of brain, unspecified: Secondary | ICD-10-CM | POA: Diagnosis not present

## 2023-11-02 DIAGNOSIS — C50919 Malignant neoplasm of unspecified site of unspecified female breast: Secondary | ICD-10-CM | POA: Diagnosis not present

## 2023-11-02 DIAGNOSIS — R220 Localized swelling, mass and lump, head: Secondary | ICD-10-CM | POA: Diagnosis not present

## 2023-11-02 DIAGNOSIS — Z170 Estrogen receptor positive status [ER+]: Principal | ICD-10-CM

## 2023-11-02 LAB — CBC WITH DIFFERENTIAL/PLATELET
Abs Immature Granulocytes: 0.02 10*3/uL (ref 0.00–0.07)
Basophils Absolute: 0 10*3/uL (ref 0.0–0.1)
Basophils Relative: 0 %
Eosinophils Absolute: 0.1 10*3/uL (ref 0.0–0.5)
Eosinophils Relative: 1 %
HCT: 32.1 % — ABNORMAL LOW (ref 36.0–46.0)
Hemoglobin: 10.8 g/dL — ABNORMAL LOW (ref 12.0–15.0)
Immature Granulocytes: 0 %
Lymphocytes Relative: 24 %
Lymphs Abs: 1.4 10*3/uL (ref 0.7–4.0)
MCH: 26.2 pg (ref 26.0–34.0)
MCHC: 33.6 g/dL (ref 30.0–36.0)
MCV: 77.7 fL — ABNORMAL LOW (ref 80.0–100.0)
Monocytes Absolute: 0.5 10*3/uL (ref 0.1–1.0)
Monocytes Relative: 9 %
Neutro Abs: 3.9 10*3/uL (ref 1.7–7.7)
Neutrophils Relative %: 66 %
Platelets: 172 10*3/uL (ref 150–400)
RBC: 4.13 MIL/uL (ref 3.87–5.11)
RDW: 13.3 % (ref 11.5–15.5)
WBC: 5.9 10*3/uL (ref 4.0–10.5)
nRBC: 0 % (ref 0.0–0.2)

## 2023-11-02 LAB — BASIC METABOLIC PANEL
Anion gap: 8 (ref 5–15)
BUN: 17 mg/dL (ref 6–20)
CO2: 26 mmol/L (ref 22–32)
Calcium: 8.6 mg/dL — ABNORMAL LOW (ref 8.9–10.3)
Chloride: 104 mmol/L (ref 98–111)
Creatinine, Ser: 0.84 mg/dL (ref 0.44–1.00)
GFR, Estimated: >60 mL/min (ref >60)
Glucose, Bld: 124 mg/dL — ABNORMAL HIGH (ref 70–99)
Potassium: 3.4 mmol/L — ABNORMAL LOW (ref 3.5–5.1)
Sodium: 138 mmol/L (ref 135–145)

## 2023-11-02 MED ORDER — LEVETIRACETAM 500 MG PO TABS: 500.0000 mg | ORAL_TABLET | Freq: Two times a day (BID) | ORAL | 2 refills | Status: AC

## 2023-11-02 MED ORDER — LORAZEPAM 2 MG/ML IJ SOLN
1.0000 mg | Freq: Once | INTRAMUSCULAR | Status: AC | PRN
  Administered 2023-11-02: 1 mg via INTRAVENOUS
  Filled 2023-11-02: qty 1

## 2023-11-02 MED ORDER — GADOBUTROL 1 MMOL/ML IV SOLN
9.0000 mL | Freq: Once | INTRAVENOUS | Status: AC | PRN
  Administered 2023-11-02: 9 mL via INTRAVENOUS

## 2023-11-02 MED ORDER — DEXAMETHASONE SODIUM PHOSPHATE 10 MG/ML IJ SOLN
10.0000 mg | Freq: Once | INTRAMUSCULAR | Status: AC
  Administered 2023-11-02: 10 mg via INTRAVENOUS
  Filled 2023-11-02: qty 1

## 2023-11-02 MED ORDER — DEXAMETHASONE 4 MG PO TABS: 4.0000 mg | ORAL_TABLET | Freq: Four times a day (QID) | ORAL | 0 refills | Status: AC

## 2023-11-02 MED ORDER — LEVETIRACETAM IN NACL 1500 MG/100ML IV SOLN
1500.0000 mg | INTRAVENOUS | Status: AC
  Administered 2023-11-02: 1500 mg via INTRAVENOUS
  Filled 2023-11-02: qty 100

## 2023-11-02 MED ORDER — SODIUM CHLORIDE 0.9 % IV SOLN: Freq: Once | INTRAVENOUS | Status: AC

## 2023-11-02 NOTE — ED Provider Notes (Signed)
  [facility] no beds declined Pt stable no further seizure activity. Neuro consulted and reviewed case, recommend Keppra and dexamethasone and outpatient follow-up.  Patient is in agreement with this plan has had no further seizure activity and is anxious to go home.  Discharge.    Pilar Jarvis, MD 11/02/23 [phone]

## 2023-11-02 NOTE — ED Notes (Signed)
Per Sec Adair Laundry @0235  Called [facility]/ Rep:Ann/Consult to transfer/Images Power shared & Facesheet Faxed.

## 2023-11-02 NOTE — Discharge Instructions (Addendum)
Take Dexamethasone and Keppra as prescribed.  Call your cancer doctors for a follow-up appointment.  Thank you for choosing [place] for your health care today!  Please see your primary doctor this week for a follow up appointment.   If you have any new, worsening, or unexpected symptoms call your doctor right away or come back to the emergency department for reevaluation.  It was my pleasure to care for you today.   Daneil Dan Modesto Charon, MD

## 2023-11-02 NOTE — ED Provider Notes (Signed)
[facility] Provider Note    Event Date/Time   First MD Initiated Contact with Patient 11/02/23 0022     (approximate)   History   Facial Twitching   HPI Tara Hanna is a 47 y.o. female with a history of left breast cancer undergoing immunotherapy managed by Dr. Laney Pastor at [facility].  She presents for evaluation of some facial twitching.  She reports that this morning she had 1 episode of left-sided facial twitching where her face was twitching and moving outside of her control.  She went to the emergency department at [facility] for evaluation.  She said that she did not receive any imaging at the time, and that evaluation of the chart indicates that she declined a head CT.  She said she was doing okay until later this evening when she had another episode that lasted perhaps several minutes.  Her daughter captured this episode on video which they showed me and it appears consistent with a focal seizure of the left side of her face.  She has no numbness or weakness in her extremities.  She still has some abnormal sensation and some droop in the left side of her face.  No visual changes.  No recent fever.  No neck pain or stiffness.  No prior history of seizure.  No changes to her medication regimen recently.     Physical Exam   Triage Vital Signs: ED Triage Vitals  Encounter Vitals Group     BP 11/01/23 2322 (!) 141/93     Systolic BP Percentile --      Diastolic BP Percentile --      Pulse Rate 11/01/23 2322 80     Resp 11/01/23 2322 16     Temp 11/01/23 2322 97.6 F (36.4 C)     Temp Source 11/01/23 2322 Oral     SpO2 11/01/23 2322 97 %     Weight 11/01/23 2322 86.2 kg (190 lb)     Height 11/01/23 2322 1.626 m (5\' 4" )     Head Circumference --      Peak Flow --      Pain Score 11/01/23 2328 0     Pain Loc --      Pain Education --      Exclude from Growth Chart --     Most recent vital signs: Vitals:   11/02/23 0458 11/02/23 0601  BP: 115/83  (!) 133/93  Pulse: 92 95  Resp: 17 15  Temp:  97.8 F (36.6 C)  SpO2: 98% 98%    General: Awake, alert, no obvious distress. CV:  Good peripheral perfusion.  Regular rate and rhythm. Resp:  Normal effort. Speaking easily and comfortably, no accessory muscle usage nor intercostal retractions.   Abd:  No distention.  Neuro:  Some left-sided facial droop although it is inconsistent, most evident when she is talking.  No seizure-like activity observed by me.  Alert and oriented, no aphasia, no cerebellar deficits appreciated.  Some dysarthria as result of the left side of her mouth having some degree of droop.   ED Results / Procedures / Treatments   Labs (all labs ordered are listed, but only abnormal results are displayed) Labs Reviewed  BASIC METABOLIC PANEL - Abnormal; Notable for the following components:      Result Value   Potassium 3.4 (*)    Glucose, Bld 124 (*)    Calcium 8.6 (*)    All other components within normal limits  CBC  WITH DIFFERENTIAL/PLATELET - Abnormal; Notable for the following components:   Hemoglobin 10.8 (*)    HCT 32.1 (*)    MCV 77.7 (*)    All other components within normal limits     RADIOLOGY I viewed and interpreted the patient's head CT and it is concerning for possible neoplasm or metastatic disease.  Radiology report confirms and recommends MRI brain with and without contrast for further characterization.  I also viewed and interpreted the patient's MRI brain with and without contrast.  See hospital course for details.   PROCEDURES:  Critical Care performed: Yes, see critical care procedure note(s)  .1-3 Lead EKG Interpretation  Performed by: Loleta Rose, MD Authorized by: Loleta Rose, MD     Interpretation: normal     ECG rate:  90   ECG rate assessment: normal     Rhythm: sinus rhythm     Ectopy: none     Conduction: normal   .Critical Care  Performed by: Loleta Rose, MD Authorized by: Loleta Rose, MD   Critical care  provider statement:    Critical care time (minutes):  60   Critical care time was exclusive of:  Separately billable procedures and treating other patients   Critical care was necessary to treat or prevent imminent or life-threatening deterioration of the following conditions:  CNS failure or compromise   Critical care was time spent personally by me on the following activities:  Development of treatment plan with patient or surrogate, evaluation of patient's response to treatment, examination of patient, obtaining history from patient or surrogate, ordering and performing treatments and interventions, ordering and review of laboratory studies, ordering and review of radiographic studies, pulse oximetry, re-evaluation of patient's condition and review of old charts     IMPRESSION / MDM / ASSESSMENT AND PLAN / ED COURSE  I reviewed the triage vital signs and the nursing notes.                              Differential diagnosis includes, but is not limited to, metastatic disease to the brain, acute intracranial hemorrhage, nonspecific seizure-like activity, electrolyte or metabolic abnormality, medication or drug side effect.  Patient's presentation is most consistent with acute presentation with potential threat to life or bodily function.  Labs/studies ordered: BMP, CBC with differential, CT head, MR brain with and without contrast  Interventions/Medications given:  Medications  dexamethasone (DECADRON) injection 10 mg (10 mg Intravenous Given 11/02/23 0102)  LORazepam (ATIVAN) injection 1 mg (1 mg Intravenous Given 11/02/23 0103)  gadobutrol (GADAVIST) 1 MMOL/ML injection 9 mL (9 mLs Intravenous Contrast Given 11/02/23 0138)  levETIRAcetam (KEPPRA) IVPB 1500 mg/ 100 mL premix (0 mg Intravenous Stopped 11/02/23 0456)  0.9 %  sodium chloride infusion ( Intravenous New Bag/Given 11/02/23 0604)    (Note:  hospital course my include additional interventions and/or labs/studies not listed  above.)   Vital signs stable and patient is neurologically stable at this time.  However the video that her daughter showed me is very consistent with focal seizure.  CT is concerning for metastatic cancer in the setting of known breast cancer.  I talked with the patient and the family and we will proceed with MRI brain with and without contrast.  I will hold off on any antiepileptic medications at this time since she is not having frequent seizures (although 2 in the last 24 hours) and because I do not want to limit neurology's ability to  assess the patient with any EEG, for example, and less she requires seizure prophylaxis.  The patient is on the cardiac monitor to evaluate for evidence of arrhythmia and/or significant heart rate changes.   Clinical Course as of 11/02/23 0750  Mon Nov 02, 2023  0231 MR Brain W and Wo Contrast I viewed and interpreted the patient's MRI brain.  Very obvious right frontal and right parietal abnormalities concerning for metastases.  Radiology confirms probable metastatic disease with associated edema.  I updated the patient and her family.  She continues to have some left-sided mouth droop although this is somewhat intermittent.  She has had no other witnessed seizures.  We talked about the plan and given that she is an established patient at [facility] oncology, it makes sense to contact them for continuity of care.  I have asked the secretary to call the [facility] logistics center so that I can speak with the oncology team to determine the appropriate disposition plan. [CF]  [phone] Spoke with [facility] logistics center, explained the situation, they will page out the call and call me back. [CF]  [id number] I spoke via the [facility] logistics center with Dr. Morrison Old , who is the on-call oncology fellow.  At this time he agrees that the patient would benefit from transfer but they have no beds available.  He recommended that we call back in the morning at 8 AM to 8:30 AM to see if there is a change  in status.  The neurointensivist declined to take the patient to the neuro ICU.  I ask about recommendations as far as antiepileptic drugs and he declined to provide any recommendations over the phone.  As a result, I have decided to order a loading dose of Keppra 1.5 g IV to try to prevent additional focal seizures.  I updated the patient and family about the plan and the delay and they understand and will continue to wait.  I let the ED secretary know the plan as well. [CF]  [id number] Patient still sleeping.  Ordered 100 mL/hour normal saline infusion. [CF]  [phone] Transferring ED care to Dr. Modesto Charon to continue to manage the patient while in the [facility] ED and to facilitate transfer. [CF]  0750 Transferred ED care to Dr. Modesto Charon to follow-up with [facility] about possible transfer as previously described.  I reassessed the patient at about 6:45 AM and she remained stable with no new seizure-like activity. 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   Loleta Rose, MD 11/02/23 [phone]

## 2023-11-02 NOTE — Consult Note (Addendum)
NEUROLOGY CONSULT NOTE   Date of service: November 02, 2023 Patient Name: Tara Hanna MRN:  [id number] DOB:  08/04/1976 Chief Complaint: "New onset seizure" Requesting Provider: Pilar Jarvis, MD  History of Present Illness  Tara Hanna is a 47 y.o. female  has a past medical history of Fibroids.  Breast cancer-left breast undergoing immunotherapy at [facility], presented to the emergency department for sudden onset of left face and eye twitching yesterday prior to presenting to the hospital.  He reports that she was taking a nap and when she woke up, her grandchild noticed that she was having twitching and made a video of it.  The clinical presentation seemed consistent with left facial focal seizures with preserved consciousness.  She was seen in the emergency department, given Keppra load.  Her left face remains somewhat droopy including the left eyelid after the twitching episode.  MRI of the brain was completed that showed to round enhancing lesions concerning for metastatic disease.  Her care was discussed with [facility] but there is no bed availability and to continue to wait in our emergency department.  She has not had a seizure for the last about 12 hours after receiving Keppra and dexamethasone.  She has not had any recent brain imaging at [facility] that I can see in care everywhere she says that she is compliant with her treatment regimen as prescribed by [facility] oncology.    ROS  Comprehensive ROS performed and pertinent positives documented in HPI    Past History   Past Medical History:  Diagnosis Date   Fibroids     Past Surgical History:  Procedure Laterality Date   ABDOMINAL HYSTERECTOMY      Family History: Family History  Problem Relation Age of Onset   Diabetes Mother    Hypertension Mother    Heart failure Maternal Grandmother    Diabetes Maternal Grandmother     Social History  reports that she has been smoking cigarettes. She has a 22 pack-year smoking history. She has  never used smokeless tobacco. She reports current alcohol use of about 2.0 standard drinks of alcohol per week. She reports current drug use. Frequency: 2.00 times per week. Drug: Marijuana.  No Known Allergies  Medications  No current facility-administered medications for this encounter.  Current Outpatient Medications:    albuterol (PROVENTIL HFA;VENTOLIN HFA) 108 (90 Base) MCG/ACT inhaler, Inhale 2-4 puffs by mouth every 4 hours as needed for wheezing, cough, and/or shortness of breath, Disp: 1 Inhaler, Rfl: 1   famotidine (PEPCID) 20 MG tablet, TAKE 1 TABLET BY MOUTH TWICE A DAY, Disp: 30 tablet, Rfl: 0  Vitals   Vitals:   11/02/23 0601 11/02/23 0700 11/02/23 0800 11/02/23 0930  BP: (!) 133/93 116/81 (!) 129/93 121/83  Pulse: 95 87 79 76  Resp: 15 15 16 18   Temp: 97.8 F (36.6 C)     TempSrc: Oral     SpO2: 98% 97% 96% 100%  Weight:      Height:        Body mass index is 32.61 kg/m.  Physical Exam  General: Well-developed well-nourished woman in no acute distress HEENT: Normocephalic, atraumatic. CVS: Regular rhythm Respiratory: Breathing well saturating normally on room air Abdomen nondistended nontender Neurological exam Awake alert oriented x 3 No dysarthria No aphasia Cranial nerves: Pupils are equal round react light, extraocular movements are intact, visual fields are full, there is mild left ptosis, mild left facial asymmetry with flattened left nasolabial fold and mild weakness on  smiling on the left, auditory acuity intact.  Tongue and palate midline. Motor examination with no drift in any of the 4 extremities although subjectively says she feels the left arm is slightly heavier than the right arm. Sensory exam intact sensation bilaterally Coordination: No dysmetria   Labs/Imaging/Neurodiagnostic studies   CBC:  Recent Labs  Lab November 13, 2023 0058  WBC 5.9  NEUTROABS 3.9  HGB 10.8*  HCT 32.1*  MCV 77.7*  PLT 172   Basic Metabolic Panel:  Lab Results   Component Value Date   NA 138 11-13-23   K 3.4 (L) 2023/11/13   CO2 26 13-Nov-2023   GLUCOSE 124 (H) 2023/11/13   BUN 17 November 13, 2023   CREATININE 0.84 November 13, 2023   CALCIUM 8.6 (L) November 13, 2023   GFRNONAA >60 11/13/23   GFRAA >60 11/04/2017   Lipid Panel:  Lab Results  Component Value Date   LDLCALC 96 01/04/2018   CT Head without contrast(Personally reviewed): Hyperdense masses in the right frontal and parietal lobes associated with surrounding edema concerning for metastatic disease.  MRI brain with and without contrast recommended  MRI Brain(Personally reviewed): With and without contrast study.  Enhancing masses in the right frontal and parietal lobes with surrounding edema consistent with metastatic disease.  No other suspicious lesions seen.  Mucosal thickening throughout paranasal sinuses with air-fluid levels in the maxillary sinus   ASSESSMENT   Tara Hanna is a 47 y.o. female  has a past medical history of Fibroids.  Left breast breast cancer currently on treatment at [facility] presenting with new onset left facial twitching consistent with focal seizure with preserved consciousness.  Brain imaging in the form of MRI brain with and without contrast reveals enhancing masses in right frontal and parietal lobes with surrounding edema consistent with metastatic disease. She has been loaded with Keppra and dexamethasone and will require continuing doses of both the steroids and antiepileptics as well as evaluation by oncology for further treatment options from an oncological perspective.  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 Continue Keppra 500 mg twice daily Also given dexamethasone 10 mg IV.  Continue dexamethasone 4 mg every 6 hours. Seizure precautions as detailed below which were discussed with the family  and the patient and they all verbalized understanding. Follow-up with [facility] oncology, and possibly neuro-oncology-will defer that to her current oncologist in the next few days. Plan discussed with the patient and `family at bedside as well as Dr. Modesto Charon, EDP  Please call with questions as needed   Signed, Milon Dikes, MD [facility]   SEIZURE PRECAUTIONS Per [facility] statutes, patients with seizures are not allowed to drive until they have been seizure-free for six months.   Use caution when using heavy equipment or power tools. Avoid working on ladders or at heights. Take showers instead of baths. Ensure the water temperature is not too high on the home water heater. Do not go swimming alone. Do not lock yourself in a room alone (i.e. bathroom). When caring for infants or small children, sit down when holding, feeding, or changing them to minimize risk of injury to the child in the event you have a seizure. Maintain good sleep hygiene. Avoid alcohol.    If patient has another seizure, call 911 and bring them back to the ED if: A.  The seizure lasts longer than 5 minutes.  B.  The patient doesn't wake shortly after the seizure or has new problems such as difficulty seeing, speaking or moving following the seizure C.  The patient was injured during the seizure D.  The patient has a temperature over 102 F (39C) E.  The patient vomited during the seizure and now is having trouble breathing

## 2023-11-02 NOTE — ED Notes (Signed)
Per MD Johnathan Hausen are no available beds at [facility]/advised to reach back out @ 8-830am.

## 2023-11-02 NOTE — ED Notes (Signed)
[facility] Called back stating up to capacity with beds, to call back in the afternoon , Informed DR.Modesto Charon MD.

## 2023-11-02 NOTE — ED Notes (Signed)
Pt assisted to bathroom

## 2023-11-02 NOTE — ED Notes (Signed)
PT back from MRI. 

## 2023-11-03 DIAGNOSIS — C50912 Malignant neoplasm of unspecified site of left female breast: Principal | ICD-10-CM

## 2023-11-03 DIAGNOSIS — R9089 Other abnormal findings on diagnostic imaging of central nervous system: Principal | ICD-10-CM

## 2023-11-03 DIAGNOSIS — Z170 Estrogen receptor positive status [ER+]: Principal | ICD-10-CM

## 2023-11-04 ENCOUNTER — Ambulatory Visit: Admit: 2023-11-04 | Discharge: 2023-11-05 | Payer: PRIVATE HEALTH INSURANCE

## 2023-11-04 DIAGNOSIS — C50912 Malignant neoplasm of unspecified site of left female breast: Secondary | ICD-10-CM | POA: Diagnosis not present

## 2023-11-04 DIAGNOSIS — Z170 Estrogen receptor positive status [ER+]: Secondary | ICD-10-CM | POA: Diagnosis not present

## 2023-11-08 ENCOUNTER — Ambulatory Visit: Admit: 2023-11-08 | Discharge: 2023-11-09 | Payer: PRIVATE HEALTH INSURANCE

## 2023-11-08 DIAGNOSIS — R911 Solitary pulmonary nodule: Secondary | ICD-10-CM | POA: Diagnosis not present

## 2023-11-08 DIAGNOSIS — C50912 Malignant neoplasm of unspecified site of left female breast: Secondary | ICD-10-CM | POA: Diagnosis not present

## 2023-11-08 DIAGNOSIS — Z170 Estrogen receptor positive status [ER+]: Secondary | ICD-10-CM | POA: Diagnosis not present

## 2023-11-08 DIAGNOSIS — C50919 Malignant neoplasm of unspecified site of unspecified female breast: Secondary | ICD-10-CM | POA: Diagnosis not present

## 2023-11-09 ENCOUNTER — Ambulatory Visit: Admit: 2023-11-09 | Discharge: 2023-11-11 | Payer: PRIVATE HEALTH INSURANCE

## 2023-11-09 DIAGNOSIS — Z90710 Acquired absence of both cervix and uterus: Secondary | ICD-10-CM | POA: Diagnosis not present

## 2023-11-09 DIAGNOSIS — C50912 Malignant neoplasm of unspecified site of left female breast: Secondary | ICD-10-CM | POA: Diagnosis not present

## 2023-11-09 DIAGNOSIS — Z170 Estrogen receptor positive status [ER+]: Secondary | ICD-10-CM | POA: Diagnosis not present

## 2023-11-09 DIAGNOSIS — R9089 Other abnormal findings on diagnostic imaging of central nervous system: Secondary | ICD-10-CM | POA: Diagnosis not present

## 2023-11-09 DIAGNOSIS — Z87891 Personal history of nicotine dependence: Secondary | ICD-10-CM | POA: Diagnosis not present

## 2023-11-09 DIAGNOSIS — I10 Essential (primary) hypertension: Secondary | ICD-10-CM | POA: Diagnosis not present

## 2023-11-09 DIAGNOSIS — C7931 Secondary malignant neoplasm of brain: Secondary | ICD-10-CM | POA: Diagnosis not present

## 2023-11-09 DIAGNOSIS — Z006 Encounter for examination for normal comparison and control in clinical research program: Secondary | ICD-10-CM | POA: Diagnosis not present

## 2023-11-09 DIAGNOSIS — Z7952 Long term (current) use of systemic steroids: Secondary | ICD-10-CM | POA: Diagnosis not present

## 2023-11-09 DIAGNOSIS — Z510 Encounter for antineoplastic radiation therapy: Secondary | ICD-10-CM | POA: Diagnosis not present

## 2023-11-09 DIAGNOSIS — C773 Secondary and unspecified malignant neoplasm of axilla and upper limb lymph nodes: Secondary | ICD-10-CM | POA: Diagnosis not present

## 2023-11-09 DIAGNOSIS — Z9012 Acquired absence of left breast and nipple: Secondary | ICD-10-CM | POA: Diagnosis not present

## 2023-11-09 DIAGNOSIS — Z7982 Long term (current) use of aspirin: Secondary | ICD-10-CM | POA: Diagnosis not present

## 2023-11-09 DIAGNOSIS — Z79899 Other long term (current) drug therapy: Secondary | ICD-10-CM | POA: Diagnosis not present

## 2023-11-11 ENCOUNTER — Encounter: Admit: 2023-11-11 | Discharge: 2023-11-11 | Payer: PRIVATE HEALTH INSURANCE

## 2023-11-11 ENCOUNTER — Ambulatory Visit: Admit: 2023-11-11 | Discharge: 2023-11-11 | Payer: PRIVATE HEALTH INSURANCE

## 2023-11-11 DIAGNOSIS — Z9012 Acquired absence of left breast and nipple: Secondary | ICD-10-CM | POA: Diagnosis not present

## 2023-11-11 DIAGNOSIS — Z7982 Long term (current) use of aspirin: Secondary | ICD-10-CM | POA: Diagnosis not present

## 2023-11-11 DIAGNOSIS — Z90710 Acquired absence of both cervix and uterus: Secondary | ICD-10-CM | POA: Diagnosis not present

## 2023-11-11 DIAGNOSIS — C50912 Malignant neoplasm of unspecified site of left female breast: Secondary | ICD-10-CM | POA: Diagnosis not present

## 2023-11-11 DIAGNOSIS — C7931 Secondary malignant neoplasm of brain: Secondary | ICD-10-CM | POA: Diagnosis not present

## 2023-11-11 DIAGNOSIS — Z7952 Long term (current) use of systemic steroids: Secondary | ICD-10-CM | POA: Diagnosis not present

## 2023-11-11 DIAGNOSIS — Z006 Encounter for examination for normal comparison and control in clinical research program: Secondary | ICD-10-CM | POA: Diagnosis not present

## 2023-11-11 DIAGNOSIS — C50812 Malignant neoplasm of overlapping sites of left female breast: Secondary | ICD-10-CM | POA: Diagnosis not present

## 2023-11-11 DIAGNOSIS — Z170 Estrogen receptor positive status [ER+]: Secondary | ICD-10-CM | POA: Diagnosis not present

## 2023-11-11 DIAGNOSIS — I10 Essential (primary) hypertension: Secondary | ICD-10-CM | POA: Diagnosis not present

## 2023-11-11 DIAGNOSIS — Z87891 Personal history of nicotine dependence: Secondary | ICD-10-CM | POA: Diagnosis not present

## 2023-11-11 DIAGNOSIS — C773 Secondary and unspecified malignant neoplasm of axilla and upper limb lymph nodes: Secondary | ICD-10-CM | POA: Diagnosis not present

## 2023-11-11 DIAGNOSIS — Z79899 Other long term (current) drug therapy: Secondary | ICD-10-CM | POA: Diagnosis not present

## 2023-11-11 DIAGNOSIS — Z510 Encounter for antineoplastic radiation therapy: Secondary | ICD-10-CM | POA: Diagnosis not present

## 2023-11-12 ENCOUNTER — Telehealth: Admit: 2023-11-12 | Discharge: 2023-11-13 | Payer: PRIVATE HEALTH INSURANCE

## 2023-11-12 DIAGNOSIS — C7931 Secondary malignant neoplasm of brain: Secondary | ICD-10-CM | POA: Diagnosis not present

## 2023-11-12 DIAGNOSIS — Z170 Estrogen receptor positive status [ER+]: Secondary | ICD-10-CM | POA: Diagnosis not present

## 2023-11-12 DIAGNOSIS — C50912 Malignant neoplasm of unspecified site of left female breast: Secondary | ICD-10-CM | POA: Diagnosis not present

## 2023-11-13 DIAGNOSIS — Z170 Estrogen receptor positive status [ER+]: Principal | ICD-10-CM

## 2023-11-13 DIAGNOSIS — C50912 Malignant neoplasm of unspecified site of left female breast: Principal | ICD-10-CM

## 2023-11-17 DIAGNOSIS — C50912 Malignant neoplasm of unspecified site of left female breast: Principal | ICD-10-CM

## 2023-11-17 DIAGNOSIS — Z170 Estrogen receptor positive status [ER+]: Principal | ICD-10-CM

## 2023-11-19 DIAGNOSIS — C50912 Malignant neoplasm of unspecified site of left female breast: Principal | ICD-10-CM

## 2023-11-19 DIAGNOSIS — Z170 Estrogen receptor positive status [ER+]: Principal | ICD-10-CM

## 2023-11-19 MED ORDER — ONDANSETRON HCL 8 MG TABLET
ORAL_TABLET | Freq: Three times a day (TID) | ORAL | 2 refills | 10.00 days | Status: CP | PRN
Start: 2023-11-19 — End: 2024-11-18

## 2023-11-20 ENCOUNTER — Other Ambulatory Visit: Admit: 2023-11-20 | Discharge: 2023-11-21 | Payer: PRIVATE HEALTH INSURANCE

## 2023-11-20 DIAGNOSIS — Z79899 Other long term (current) drug therapy: Secondary | ICD-10-CM | POA: Diagnosis not present

## 2023-11-20 DIAGNOSIS — C50912 Malignant neoplasm of unspecified site of left female breast: Secondary | ICD-10-CM | POA: Diagnosis not present

## 2023-11-20 DIAGNOSIS — Z90710 Acquired absence of both cervix and uterus: Secondary | ICD-10-CM | POA: Diagnosis not present

## 2023-11-20 DIAGNOSIS — Z9012 Acquired absence of left breast and nipple: Secondary | ICD-10-CM | POA: Diagnosis not present

## 2023-11-20 DIAGNOSIS — Z006 Encounter for examination for normal comparison and control in clinical research program: Secondary | ICD-10-CM | POA: Diagnosis not present

## 2023-11-20 DIAGNOSIS — Z510 Encounter for antineoplastic radiation therapy: Secondary | ICD-10-CM | POA: Diagnosis not present

## 2023-11-20 DIAGNOSIS — Z7952 Long term (current) use of systemic steroids: Secondary | ICD-10-CM | POA: Diagnosis not present

## 2023-11-20 DIAGNOSIS — C773 Secondary and unspecified malignant neoplasm of axilla and upper limb lymph nodes: Secondary | ICD-10-CM | POA: Diagnosis not present

## 2023-11-20 DIAGNOSIS — Z87891 Personal history of nicotine dependence: Secondary | ICD-10-CM | POA: Diagnosis not present

## 2023-11-20 DIAGNOSIS — C7931 Secondary malignant neoplasm of brain: Secondary | ICD-10-CM | POA: Diagnosis not present

## 2023-11-20 DIAGNOSIS — I10 Essential (primary) hypertension: Secondary | ICD-10-CM | POA: Diagnosis not present

## 2023-11-20 DIAGNOSIS — Z7982 Long term (current) use of aspirin: Secondary | ICD-10-CM | POA: Diagnosis not present

## 2023-11-20 DIAGNOSIS — Z170 Estrogen receptor positive status [ER+]: Secondary | ICD-10-CM | POA: Diagnosis not present

## 2023-11-20 MED ORDER — LEVETIRACETAM 750 MG TABLET
ORAL_TABLET | Freq: Two times a day (BID) | ORAL | 2 refills | 30.00 days | Status: CP
Start: 2023-11-20 — End: 2024-02-18

## 2023-11-20 MED ORDER — STUDY BRE18-360 OLAPARIB 150 MG TABLET
PACK | Freq: Two times a day (BID) | ORAL | 0 refills | 30 days | Status: CP
Start: 2023-11-20 — End: 2023-12-18

## 2023-11-21 DIAGNOSIS — C50912 Malignant neoplasm of unspecified site of left female breast: Principal | ICD-10-CM

## 2023-11-21 DIAGNOSIS — C7931 Secondary malignant neoplasm of brain: Principal | ICD-10-CM

## 2023-11-21 DIAGNOSIS — Z170 Estrogen receptor positive status [ER+]: Principal | ICD-10-CM

## 2023-11-25 DIAGNOSIS — Z419 Encounter for procedure for purposes other than remedying health state, unspecified: Secondary | ICD-10-CM | POA: Diagnosis not present

## 2023-11-26 ENCOUNTER — Ambulatory Visit: Admit: 2023-11-26 | Payer: PRIVATE HEALTH INSURANCE | Attending: Radiation Oncology | Primary: Radiation Oncology

## 2023-11-26 ENCOUNTER — Inpatient Hospital Stay
Admit: 2023-11-26 | Discharge: 2023-12-04 | Payer: PRIVATE HEALTH INSURANCE | Attending: Student in an Organized Health Care Education/Training Program | Primary: Student in an Organized Health Care Education/Training Program

## 2023-11-26 ENCOUNTER — Inpatient Hospital Stay: Admit: 2023-11-26 | Discharge: 2023-11-27 | Payer: PRIVATE HEALTH INSURANCE

## 2023-11-26 ENCOUNTER — Inpatient Hospital Stay
Admit: 2023-11-26 | Discharge: 2023-11-28 | Payer: PRIVATE HEALTH INSURANCE | Attending: Student in an Organized Health Care Education/Training Program | Primary: Student in an Organized Health Care Education/Training Program

## 2023-11-26 ENCOUNTER — Ambulatory Visit
Admit: 2023-11-26 | Payer: PRIVATE HEALTH INSURANCE | Attending: Student in an Organized Health Care Education/Training Program | Primary: Student in an Organized Health Care Education/Training Program

## 2023-11-26 ENCOUNTER — Inpatient Hospital Stay
Admit: 2023-11-26 | Discharge: 2023-12-02 | Payer: PRIVATE HEALTH INSURANCE | Attending: Radiation Oncology | Primary: Radiation Oncology

## 2023-11-26 DIAGNOSIS — Z79899 Other long term (current) drug therapy: Secondary | ICD-10-CM | POA: Diagnosis not present

## 2023-11-26 DIAGNOSIS — Z170 Estrogen receptor positive status [ER+]: Secondary | ICD-10-CM | POA: Diagnosis not present

## 2023-11-26 DIAGNOSIS — C7931 Secondary malignant neoplasm of brain: Secondary | ICD-10-CM | POA: Diagnosis not present

## 2023-11-26 DIAGNOSIS — C773 Secondary and unspecified malignant neoplasm of axilla and upper limb lymph nodes: Secondary | ICD-10-CM | POA: Diagnosis not present

## 2023-11-26 DIAGNOSIS — Z7982 Long term (current) use of aspirin: Secondary | ICD-10-CM | POA: Diagnosis not present

## 2023-11-26 DIAGNOSIS — C50812 Malignant neoplasm of overlapping sites of left female breast: Secondary | ICD-10-CM | POA: Diagnosis not present

## 2023-11-26 DIAGNOSIS — Z510 Encounter for antineoplastic radiation therapy: Secondary | ICD-10-CM | POA: Diagnosis not present

## 2023-11-26 DIAGNOSIS — C50912 Malignant neoplasm of unspecified site of left female breast: Secondary | ICD-10-CM | POA: Diagnosis not present

## 2023-11-26 DIAGNOSIS — Z90710 Acquired absence of both cervix and uterus: Secondary | ICD-10-CM | POA: Diagnosis not present

## 2023-11-26 DIAGNOSIS — Z9012 Acquired absence of left breast and nipple: Secondary | ICD-10-CM | POA: Diagnosis not present

## 2023-11-26 DIAGNOSIS — Z7952 Long term (current) use of systemic steroids: Secondary | ICD-10-CM | POA: Diagnosis not present

## 2023-11-26 DIAGNOSIS — Z006 Encounter for examination for normal comparison and control in clinical research program: Secondary | ICD-10-CM | POA: Diagnosis not present

## 2023-11-26 DIAGNOSIS — I10 Essential (primary) hypertension: Secondary | ICD-10-CM | POA: Diagnosis not present

## 2023-11-26 DIAGNOSIS — Z87891 Personal history of nicotine dependence: Secondary | ICD-10-CM | POA: Diagnosis not present

## 2023-11-27 DIAGNOSIS — Z9012 Acquired absence of left breast and nipple: Secondary | ICD-10-CM | POA: Diagnosis not present

## 2023-11-27 DIAGNOSIS — Z79899 Other long term (current) drug therapy: Secondary | ICD-10-CM | POA: Diagnosis not present

## 2023-11-27 DIAGNOSIS — Z170 Estrogen receptor positive status [ER+]: Secondary | ICD-10-CM | POA: Diagnosis not present

## 2023-11-27 DIAGNOSIS — Z90710 Acquired absence of both cervix and uterus: Secondary | ICD-10-CM | POA: Diagnosis not present

## 2023-11-27 DIAGNOSIS — I10 Essential (primary) hypertension: Secondary | ICD-10-CM | POA: Diagnosis not present

## 2023-11-27 DIAGNOSIS — C50912 Malignant neoplasm of unspecified site of left female breast: Secondary | ICD-10-CM | POA: Diagnosis not present

## 2023-11-27 DIAGNOSIS — Z006 Encounter for examination for normal comparison and control in clinical research program: Secondary | ICD-10-CM | POA: Diagnosis not present

## 2023-11-27 DIAGNOSIS — C50919 Malignant neoplasm of unspecified site of unspecified female breast: Secondary | ICD-10-CM | POA: Diagnosis not present

## 2023-11-27 DIAGNOSIS — Z87891 Personal history of nicotine dependence: Secondary | ICD-10-CM | POA: Diagnosis not present

## 2023-11-27 DIAGNOSIS — Z7952 Long term (current) use of systemic steroids: Secondary | ICD-10-CM | POA: Diagnosis not present

## 2023-11-27 DIAGNOSIS — C773 Secondary and unspecified malignant neoplasm of axilla and upper limb lymph nodes: Secondary | ICD-10-CM | POA: Diagnosis not present

## 2023-11-27 DIAGNOSIS — C7931 Secondary malignant neoplasm of brain: Secondary | ICD-10-CM | POA: Diagnosis not present

## 2023-11-27 DIAGNOSIS — Z510 Encounter for antineoplastic radiation therapy: Secondary | ICD-10-CM | POA: Diagnosis not present

## 2023-11-27 DIAGNOSIS — Z7982 Long term (current) use of aspirin: Secondary | ICD-10-CM | POA: Diagnosis not present

## 2023-11-30 DIAGNOSIS — Z170 Estrogen receptor positive status [ER+]: Secondary | ICD-10-CM | POA: Diagnosis not present

## 2023-11-30 DIAGNOSIS — Z510 Encounter for antineoplastic radiation therapy: Secondary | ICD-10-CM | POA: Diagnosis not present

## 2023-11-30 DIAGNOSIS — I10 Essential (primary) hypertension: Secondary | ICD-10-CM | POA: Diagnosis not present

## 2023-11-30 DIAGNOSIS — Z9012 Acquired absence of left breast and nipple: Secondary | ICD-10-CM | POA: Diagnosis not present

## 2023-11-30 DIAGNOSIS — C773 Secondary and unspecified malignant neoplasm of axilla and upper limb lymph nodes: Secondary | ICD-10-CM | POA: Diagnosis not present

## 2023-11-30 DIAGNOSIS — Z79899 Other long term (current) drug therapy: Secondary | ICD-10-CM | POA: Diagnosis not present

## 2023-11-30 DIAGNOSIS — Z7982 Long term (current) use of aspirin: Secondary | ICD-10-CM | POA: Diagnosis not present

## 2023-11-30 DIAGNOSIS — Z87891 Personal history of nicotine dependence: Secondary | ICD-10-CM | POA: Diagnosis not present

## 2023-11-30 DIAGNOSIS — C50912 Malignant neoplasm of unspecified site of left female breast: Secondary | ICD-10-CM | POA: Diagnosis not present

## 2023-11-30 DIAGNOSIS — Z7952 Long term (current) use of systemic steroids: Secondary | ICD-10-CM | POA: Diagnosis not present

## 2023-11-30 DIAGNOSIS — Z90710 Acquired absence of both cervix and uterus: Secondary | ICD-10-CM | POA: Diagnosis not present

## 2023-11-30 DIAGNOSIS — Z006 Encounter for examination for normal comparison and control in clinical research program: Secondary | ICD-10-CM | POA: Diagnosis not present

## 2023-11-30 DIAGNOSIS — C7931 Secondary malignant neoplasm of brain: Secondary | ICD-10-CM | POA: Diagnosis not present

## 2023-12-01 DIAGNOSIS — C7931 Secondary malignant neoplasm of brain: Secondary | ICD-10-CM | POA: Diagnosis not present

## 2023-12-01 DIAGNOSIS — Z87891 Personal history of nicotine dependence: Secondary | ICD-10-CM | POA: Diagnosis not present

## 2023-12-01 DIAGNOSIS — Z9012 Acquired absence of left breast and nipple: Secondary | ICD-10-CM | POA: Diagnosis not present

## 2023-12-01 DIAGNOSIS — Z7982 Long term (current) use of aspirin: Secondary | ICD-10-CM | POA: Diagnosis not present

## 2023-12-01 DIAGNOSIS — C773 Secondary and unspecified malignant neoplasm of axilla and upper limb lymph nodes: Secondary | ICD-10-CM | POA: Diagnosis not present

## 2023-12-01 DIAGNOSIS — Z170 Estrogen receptor positive status [ER+]: Secondary | ICD-10-CM | POA: Diagnosis not present

## 2023-12-01 DIAGNOSIS — Z7952 Long term (current) use of systemic steroids: Secondary | ICD-10-CM | POA: Diagnosis not present

## 2023-12-01 DIAGNOSIS — Z90710 Acquired absence of both cervix and uterus: Secondary | ICD-10-CM | POA: Diagnosis not present

## 2023-12-01 DIAGNOSIS — Z510 Encounter for antineoplastic radiation therapy: Secondary | ICD-10-CM | POA: Diagnosis not present

## 2023-12-01 DIAGNOSIS — I10 Essential (primary) hypertension: Secondary | ICD-10-CM | POA: Diagnosis not present

## 2023-12-01 DIAGNOSIS — C50912 Malignant neoplasm of unspecified site of left female breast: Secondary | ICD-10-CM | POA: Diagnosis not present

## 2023-12-01 DIAGNOSIS — Z006 Encounter for examination for normal comparison and control in clinical research program: Secondary | ICD-10-CM | POA: Diagnosis not present

## 2023-12-01 DIAGNOSIS — Z79899 Other long term (current) drug therapy: Secondary | ICD-10-CM | POA: Diagnosis not present

## 2023-12-01 DIAGNOSIS — R7989 Other specified abnormal findings of blood chemistry: Principal | ICD-10-CM

## 2023-12-02 ENCOUNTER — Ambulatory Visit: Admit: 2023-12-02 | Payer: PRIVATE HEALTH INSURANCE

## 2023-12-02 DIAGNOSIS — Z170 Estrogen receptor positive status [ER+]: Principal | ICD-10-CM

## 2023-12-02 DIAGNOSIS — C50912 Malignant neoplasm of unspecified site of left female breast: Principal | ICD-10-CM

## 2023-12-03 DIAGNOSIS — Z79899 Other long term (current) drug therapy: Secondary | ICD-10-CM | POA: Diagnosis not present

## 2023-12-03 DIAGNOSIS — Z170 Estrogen receptor positive status [ER+]: Secondary | ICD-10-CM | POA: Diagnosis not present

## 2023-12-03 DIAGNOSIS — Z510 Encounter for antineoplastic radiation therapy: Secondary | ICD-10-CM | POA: Diagnosis not present

## 2023-12-03 DIAGNOSIS — Z87891 Personal history of nicotine dependence: Secondary | ICD-10-CM | POA: Diagnosis not present

## 2023-12-03 DIAGNOSIS — C773 Secondary and unspecified malignant neoplasm of axilla and upper limb lymph nodes: Secondary | ICD-10-CM | POA: Diagnosis not present

## 2023-12-03 DIAGNOSIS — C50912 Malignant neoplasm of unspecified site of left female breast: Secondary | ICD-10-CM | POA: Diagnosis not present

## 2023-12-03 DIAGNOSIS — Z7982 Long term (current) use of aspirin: Secondary | ICD-10-CM | POA: Diagnosis not present

## 2023-12-03 DIAGNOSIS — Z9012 Acquired absence of left breast and nipple: Secondary | ICD-10-CM | POA: Diagnosis not present

## 2023-12-03 DIAGNOSIS — C7931 Secondary malignant neoplasm of brain: Secondary | ICD-10-CM | POA: Diagnosis not present

## 2023-12-03 DIAGNOSIS — Z7952 Long term (current) use of systemic steroids: Secondary | ICD-10-CM | POA: Diagnosis not present

## 2023-12-03 DIAGNOSIS — Z90710 Acquired absence of both cervix and uterus: Secondary | ICD-10-CM | POA: Diagnosis not present

## 2023-12-03 DIAGNOSIS — Z006 Encounter for examination for normal comparison and control in clinical research program: Secondary | ICD-10-CM | POA: Diagnosis not present

## 2023-12-03 DIAGNOSIS — I10 Essential (primary) hypertension: Secondary | ICD-10-CM | POA: Diagnosis not present

## 2023-12-03 DIAGNOSIS — C50812 Malignant neoplasm of overlapping sites of left female breast: Secondary | ICD-10-CM | POA: Diagnosis not present

## 2023-12-04 MED ORDER — DEXAMETHASONE 2 MG TABLET
ORAL_TABLET | ORAL | 0 refills | 14.00 days | Status: CP
Start: 2023-12-04 — End: 2023-12-18

## 2023-12-08 DIAGNOSIS — Z170 Estrogen receptor positive status [ER+]: Principal | ICD-10-CM

## 2023-12-08 DIAGNOSIS — C50912 Malignant neoplasm of unspecified site of left female breast: Principal | ICD-10-CM

## 2023-12-09 DIAGNOSIS — F411 Generalized anxiety disorder: Secondary | ICD-10-CM | POA: Diagnosis not present

## 2023-12-12 DIAGNOSIS — F411 Generalized anxiety disorder: Secondary | ICD-10-CM | POA: Diagnosis not present

## 2023-12-14 DIAGNOSIS — F411 Generalized anxiety disorder: Secondary | ICD-10-CM | POA: Diagnosis not present

## 2023-12-14 DIAGNOSIS — Z170 Estrogen receptor positive status [ER+]: Principal | ICD-10-CM

## 2023-12-14 DIAGNOSIS — C50912 Malignant neoplasm of unspecified site of left female breast: Principal | ICD-10-CM

## 2023-12-16 DIAGNOSIS — C50912 Malignant neoplasm of unspecified site of left female breast: Principal | ICD-10-CM

## 2023-12-16 DIAGNOSIS — C7931 Secondary malignant neoplasm of brain: Principal | ICD-10-CM

## 2023-12-16 DIAGNOSIS — Z170 Estrogen receptor positive status [ER+]: Principal | ICD-10-CM

## 2023-12-17 ENCOUNTER — Ambulatory Visit: Admit: 2023-12-17 | Discharge: 2023-12-18 | Payer: PRIVATE HEALTH INSURANCE

## 2023-12-17 ENCOUNTER — Inpatient Hospital Stay: Admit: 2023-12-17 | Discharge: 2023-12-18 | Payer: PRIVATE HEALTH INSURANCE

## 2023-12-17 ENCOUNTER — Other Ambulatory Visit: Admit: 2023-12-17 | Discharge: 2023-12-18 | Payer: PRIVATE HEALTH INSURANCE

## 2023-12-17 DIAGNOSIS — F411 Generalized anxiety disorder: Secondary | ICD-10-CM | POA: Diagnosis not present

## 2023-12-17 DIAGNOSIS — Z170 Estrogen receptor positive status [ER+]: Secondary | ICD-10-CM | POA: Diagnosis not present

## 2023-12-17 DIAGNOSIS — Z006 Encounter for examination for normal comparison and control in clinical research program: Secondary | ICD-10-CM | POA: Diagnosis not present

## 2023-12-17 DIAGNOSIS — C50912 Malignant neoplasm of unspecified site of left female breast: Secondary | ICD-10-CM | POA: Diagnosis not present

## 2023-12-17 DIAGNOSIS — C7931 Secondary malignant neoplasm of brain: Principal | ICD-10-CM

## 2023-12-17 MED ORDER — STUDY BRE18-360 OLAPARIB 150 MG TABLET
PACK | Freq: Two times a day (BID) | ORAL | 0 refills | 30 days | Status: CP
Start: 2023-12-17 — End: 2024-01-07

## 2023-12-17 MED ORDER — STUDY BRE18-360 OLAPARIB 100 MG TABLET
PACK | Freq: Two times a day (BID) | ORAL | 0 refills | 30 days | Status: CP
Start: 2023-12-17 — End: 2024-01-07

## 2023-12-17 MED ORDER — ZZ IMS TEMPLATE
Freq: Two times a day (BID) | ORAL | 0 refills | 21.00 days | Status: CN
Start: 2023-12-17 — End: 2024-01-07

## 2023-12-18 DIAGNOSIS — C50912 Malignant neoplasm of unspecified site of left female breast: Principal | ICD-10-CM

## 2023-12-18 DIAGNOSIS — Z170 Estrogen receptor positive status [ER+]: Principal | ICD-10-CM

## 2023-12-20 DIAGNOSIS — F411 Generalized anxiety disorder: Secondary | ICD-10-CM | POA: Diagnosis not present

## 2023-12-24 DIAGNOSIS — F411 Generalized anxiety disorder: Secondary | ICD-10-CM | POA: Diagnosis not present

## 2023-12-25 DIAGNOSIS — Z170 Estrogen receptor positive status [ER+]: Principal | ICD-10-CM

## 2023-12-25 DIAGNOSIS — C50912 Malignant neoplasm of unspecified site of left female breast: Principal | ICD-10-CM

## 2023-12-26 DIAGNOSIS — Z419 Encounter for procedure for purposes other than remedying health state, unspecified: Secondary | ICD-10-CM | POA: Diagnosis not present

## 2023-12-28 DIAGNOSIS — F411 Generalized anxiety disorder: Secondary | ICD-10-CM | POA: Diagnosis not present

## 2023-12-30 DIAGNOSIS — C50912 Malignant neoplasm of unspecified site of left female breast: Principal | ICD-10-CM

## 2023-12-30 DIAGNOSIS — Z170 Estrogen receptor positive status [ER+]: Principal | ICD-10-CM

## 2023-12-31 DIAGNOSIS — F411 Generalized anxiety disorder: Secondary | ICD-10-CM | POA: Diagnosis not present

## 2024-01-01 DIAGNOSIS — Z170 Estrogen receptor positive status [ER+]: Principal | ICD-10-CM

## 2024-01-01 DIAGNOSIS — C50912 Malignant neoplasm of unspecified site of left female breast: Principal | ICD-10-CM

## 2024-01-04 DIAGNOSIS — Z170 Estrogen receptor positive status [ER+]: Principal | ICD-10-CM

## 2024-01-04 DIAGNOSIS — C50912 Malignant neoplasm of unspecified site of left female breast: Principal | ICD-10-CM

## 2024-01-05 ENCOUNTER — Ambulatory Visit: Admit: 2024-01-05 | Payer: PRIVATE HEALTH INSURANCE

## 2024-01-05 DIAGNOSIS — C50912 Malignant neoplasm of unspecified site of left female breast: Principal | ICD-10-CM

## 2024-01-05 DIAGNOSIS — Z170 Estrogen receptor positive status [ER+]: Principal | ICD-10-CM

## 2024-01-06 ENCOUNTER — Ambulatory Visit: Admit: 2024-01-06 | Discharge: 2024-01-07 | Payer: PRIVATE HEALTH INSURANCE

## 2024-01-06 ENCOUNTER — Inpatient Hospital Stay: Admit: 2024-01-06 | Discharge: 2024-01-07 | Payer: PRIVATE HEALTH INSURANCE

## 2024-01-06 ENCOUNTER — Other Ambulatory Visit: Admit: 2024-01-06 | Discharge: 2024-01-07 | Payer: PRIVATE HEALTH INSURANCE

## 2024-01-06 DIAGNOSIS — F411 Generalized anxiety disorder: Secondary | ICD-10-CM | POA: Diagnosis not present

## 2024-01-06 DIAGNOSIS — Z006 Encounter for examination for normal comparison and control in clinical research program: Secondary | ICD-10-CM | POA: Diagnosis not present

## 2024-01-06 DIAGNOSIS — Z170 Estrogen receptor positive status [ER+]: Secondary | ICD-10-CM | POA: Diagnosis not present

## 2024-01-06 DIAGNOSIS — C50912 Malignant neoplasm of unspecified site of left female breast: Secondary | ICD-10-CM | POA: Diagnosis not present

## 2024-01-06 DIAGNOSIS — C7931 Secondary malignant neoplasm of brain: Principal | ICD-10-CM

## 2024-01-06 MED ORDER — STUDY BRE18-360 OLAPARIB 150 MG TABLET
PACK | Freq: Two times a day (BID) | ORAL | 0 refills | 30 days | Status: CP
Start: 2024-01-06 — End: 2024-01-27

## 2024-01-06 MED ORDER — ZZ IMS TEMPLATE
Freq: Two times a day (BID) | ORAL | 0 refills | 21.00 days | Status: CN
Start: 2024-01-06 — End: 2024-01-27

## 2024-01-06 MED ORDER — STUDY BRE18-360 OLAPARIB 100 MG TABLET
PACK | Freq: Two times a day (BID) | ORAL | 0 refills | 30 days | Status: CP
Start: 2024-01-06 — End: 2024-01-27

## 2024-01-07 DIAGNOSIS — F411 Generalized anxiety disorder: Secondary | ICD-10-CM | POA: Diagnosis not present

## 2024-01-07 DIAGNOSIS — C50912 Malignant neoplasm of unspecified site of left female breast: Principal | ICD-10-CM

## 2024-01-07 DIAGNOSIS — Z170 Estrogen receptor positive status [ER+]: Principal | ICD-10-CM

## 2024-01-08 DIAGNOSIS — C50912 Malignant neoplasm of unspecified site of left female breast: Principal | ICD-10-CM

## 2024-01-08 DIAGNOSIS — Z170 Estrogen receptor positive status [ER+]: Principal | ICD-10-CM

## 2024-01-10 DIAGNOSIS — F411 Generalized anxiety disorder: Secondary | ICD-10-CM | POA: Diagnosis not present

## 2024-01-12 ENCOUNTER — Ambulatory Visit: Admit: 2024-01-12 | Discharge: 2024-01-13 | Payer: PRIVATE HEALTH INSURANCE

## 2024-01-12 DIAGNOSIS — G40109 Localization-related (focal) (partial) symptomatic epilepsy and epileptic syndromes with simple partial seizures, not intractable, without status epilepticus: Secondary | ICD-10-CM | POA: Diagnosis not present

## 2024-01-12 MED ORDER — LACOSAMIDE 100 MG TABLET
ORAL_TABLET | Freq: Two times a day (BID) | ORAL | 5 refills | 90.00 days | Status: CP
Start: 2024-01-12 — End: ?

## 2024-01-13 DIAGNOSIS — Z170 Estrogen receptor positive status [ER+]: Principal | ICD-10-CM

## 2024-01-13 DIAGNOSIS — C50912 Malignant neoplasm of unspecified site of left female breast: Principal | ICD-10-CM

## 2024-01-14 DIAGNOSIS — F411 Generalized anxiety disorder: Secondary | ICD-10-CM | POA: Diagnosis not present

## 2024-01-15 DIAGNOSIS — Z170 Estrogen receptor positive status [ER+]: Principal | ICD-10-CM

## 2024-01-15 DIAGNOSIS — B029 Zoster without complications: Principal | ICD-10-CM

## 2024-01-15 DIAGNOSIS — C50912 Malignant neoplasm of unspecified site of left female breast: Principal | ICD-10-CM

## 2024-01-15 MED ORDER — VALACYCLOVIR 1 GRAM TABLET
ORAL_TABLET | Freq: Three times a day (TID) | ORAL | 0 refills | 7.00 days | Status: CP
Start: 2024-01-15 — End: 2024-01-22

## 2024-01-19 DIAGNOSIS — F411 Generalized anxiety disorder: Secondary | ICD-10-CM | POA: Diagnosis not present

## 2024-01-21 DIAGNOSIS — F411 Generalized anxiety disorder: Secondary | ICD-10-CM | POA: Diagnosis not present

## 2024-01-23 DIAGNOSIS — Z419 Encounter for procedure for purposes other than remedying health state, unspecified: Secondary | ICD-10-CM | POA: Diagnosis not present

## 2024-01-26 DIAGNOSIS — C50912 Malignant neoplasm of unspecified site of left female breast: Principal | ICD-10-CM

## 2024-01-26 DIAGNOSIS — Z170 Estrogen receptor positive status [ER+]: Principal | ICD-10-CM

## 2024-01-27 DIAGNOSIS — C50912 Malignant neoplasm of unspecified site of left female breast: Principal | ICD-10-CM

## 2024-01-27 DIAGNOSIS — Z170 Estrogen receptor positive status [ER+]: Principal | ICD-10-CM

## 2024-01-28 ENCOUNTER — Ambulatory Visit: Admit: 2024-01-28 | Discharge: 2024-01-29 | Payer: PRIVATE HEALTH INSURANCE

## 2024-01-28 ENCOUNTER — Inpatient Hospital Stay: Admit: 2024-01-28 | Discharge: 2024-01-29 | Payer: PRIVATE HEALTH INSURANCE

## 2024-01-28 DIAGNOSIS — Z853 Personal history of malignant neoplasm of breast: Secondary | ICD-10-CM | POA: Diagnosis not present

## 2024-01-28 DIAGNOSIS — C7931 Secondary malignant neoplasm of brain: Secondary | ICD-10-CM | POA: Diagnosis not present

## 2024-01-28 DIAGNOSIS — C50912 Malignant neoplasm of unspecified site of left female breast: Secondary | ICD-10-CM | POA: Diagnosis not present

## 2024-01-28 DIAGNOSIS — Z006 Encounter for examination for normal comparison and control in clinical research program: Secondary | ICD-10-CM | POA: Diagnosis not present

## 2024-01-28 DIAGNOSIS — Z170 Estrogen receptor positive status [ER+]: Secondary | ICD-10-CM | POA: Diagnosis not present

## 2024-01-28 DIAGNOSIS — F411 Generalized anxiety disorder: Secondary | ICD-10-CM | POA: Diagnosis not present

## 2024-01-28 DIAGNOSIS — Z08 Encounter for follow-up examination after completed treatment for malignant neoplasm: Secondary | ICD-10-CM | POA: Diagnosis not present

## 2024-01-28 MED ORDER — STUDY BRE18-360 OLAPARIB 100 MG TABLET
PACK | Freq: Two times a day (BID) | ORAL | 0 refills | 30 days | Status: CP
Start: 2024-01-28 — End: 2024-02-18

## 2024-01-28 MED ORDER — STUDY BRE18-360 OLAPARIB 150 MG TABLET
PACK | Freq: Two times a day (BID) | ORAL | 0 refills | 30 days | Status: CP
Start: 2024-01-28 — End: 2024-02-18

## 2024-01-28 MED ORDER — ZZ IMS TEMPLATE
Freq: Two times a day (BID) | ORAL | 0 refills | 21.00 days | Status: CN
Start: 2024-01-28 — End: 2024-02-18

## 2024-01-29 DIAGNOSIS — F411 Generalized anxiety disorder: Secondary | ICD-10-CM | POA: Diagnosis not present

## 2024-01-29 DIAGNOSIS — Z170 Estrogen receptor positive status [ER+]: Principal | ICD-10-CM

## 2024-01-29 DIAGNOSIS — C7931 Secondary malignant neoplasm of brain: Principal | ICD-10-CM

## 2024-01-29 DIAGNOSIS — C50912 Malignant neoplasm of unspecified site of left female breast: Principal | ICD-10-CM

## 2024-02-01 DIAGNOSIS — Z170 Estrogen receptor positive status [ER+]: Principal | ICD-10-CM

## 2024-02-01 DIAGNOSIS — C7931 Secondary malignant neoplasm of brain: Principal | ICD-10-CM

## 2024-02-01 DIAGNOSIS — C50912 Malignant neoplasm of unspecified site of left female breast: Principal | ICD-10-CM

## 2024-02-03 DIAGNOSIS — F411 Generalized anxiety disorder: Secondary | ICD-10-CM | POA: Diagnosis not present

## 2024-02-06 DIAGNOSIS — F411 Generalized anxiety disorder: Secondary | ICD-10-CM | POA: Diagnosis not present

## 2024-02-08 DIAGNOSIS — C50912 Malignant neoplasm of unspecified site of left female breast: Principal | ICD-10-CM

## 2024-02-08 DIAGNOSIS — Z170 Estrogen receptor positive status [ER+]: Principal | ICD-10-CM

## 2024-02-08 DIAGNOSIS — C7931 Secondary malignant neoplasm of brain: Principal | ICD-10-CM

## 2024-02-09 DIAGNOSIS — Z170 Estrogen receptor positive status [ER+]: Principal | ICD-10-CM

## 2024-02-09 DIAGNOSIS — C50912 Malignant neoplasm of unspecified site of left female breast: Principal | ICD-10-CM

## 2024-02-09 DIAGNOSIS — C7931 Secondary malignant neoplasm of brain: Principal | ICD-10-CM

## 2024-02-10 DIAGNOSIS — F411 Generalized anxiety disorder: Secondary | ICD-10-CM | POA: Diagnosis not present

## 2024-02-11 DIAGNOSIS — Z170 Estrogen receptor positive status [ER+]: Principal | ICD-10-CM

## 2024-02-11 DIAGNOSIS — C7931 Secondary malignant neoplasm of brain: Principal | ICD-10-CM

## 2024-02-11 DIAGNOSIS — C50912 Malignant neoplasm of unspecified site of left female breast: Principal | ICD-10-CM

## 2024-02-12 DIAGNOSIS — F411 Generalized anxiety disorder: Secondary | ICD-10-CM | POA: Diagnosis not present

## 2024-02-16 ENCOUNTER — Ambulatory Visit: Admit: 2024-02-16 | Discharge: 2024-02-17 | Payer: PRIVATE HEALTH INSURANCE

## 2024-02-16 DIAGNOSIS — R7989 Other specified abnormal findings of blood chemistry: Secondary | ICD-10-CM | POA: Diagnosis not present

## 2024-02-16 DIAGNOSIS — Z2989 Encounter for other specified prophylactic measures: Secondary | ICD-10-CM | POA: Diagnosis not present

## 2024-02-16 DIAGNOSIS — Z170 Estrogen receptor positive status [ER+]: Secondary | ICD-10-CM | POA: Diagnosis not present

## 2024-02-16 DIAGNOSIS — C50912 Malignant neoplasm of unspecified site of left female breast: Secondary | ICD-10-CM | POA: Diagnosis not present

## 2024-02-16 DIAGNOSIS — C7931 Secondary malignant neoplasm of brain: Principal | ICD-10-CM

## 2024-02-17 ENCOUNTER — Inpatient Hospital Stay: Admit: 2024-02-17 | Discharge: 2024-02-18 | Payer: PRIVATE HEALTH INSURANCE

## 2024-02-17 ENCOUNTER — Inpatient Hospital Stay
Admit: 2024-02-17 | Discharge: 2024-02-22 | Payer: PRIVATE HEALTH INSURANCE | Attending: Radiation Oncology | Primary: Radiation Oncology

## 2024-02-17 ENCOUNTER — Other Ambulatory Visit: Admit: 2024-02-17 | Discharge: 2024-02-18 | Payer: PRIVATE HEALTH INSURANCE

## 2024-02-17 DIAGNOSIS — Z006 Encounter for examination for normal comparison and control in clinical research program: Secondary | ICD-10-CM | POA: Diagnosis not present

## 2024-02-17 DIAGNOSIS — Z170 Estrogen receptor positive status [ER+]: Secondary | ICD-10-CM | POA: Diagnosis not present

## 2024-02-17 DIAGNOSIS — C7931 Secondary malignant neoplasm of brain: Secondary | ICD-10-CM | POA: Diagnosis not present

## 2024-02-17 DIAGNOSIS — C50912 Malignant neoplasm of unspecified site of left female breast: Secondary | ICD-10-CM | POA: Diagnosis not present

## 2024-02-17 DIAGNOSIS — R7989 Other specified abnormal findings of blood chemistry: Principal | ICD-10-CM

## 2024-02-17 MED ORDER — STUDY BRE18-360 OLAPARIB 150 MG TABLET
PACK | Freq: Two times a day (BID) | ORAL | 0 refills | 30 days | Status: CP
Start: 2024-02-17 — End: 2024-03-09

## 2024-02-17 MED ORDER — ZZ IMS TEMPLATE
Freq: Two times a day (BID) | ORAL | 0 refills | 21 days | Status: CN
Start: 2024-02-17 — End: 2024-03-09

## 2024-02-17 MED ORDER — STUDY BRE18-360 OLAPARIB 100 MG TABLET
PACK | Freq: Two times a day (BID) | ORAL | 0 refills | 30 days | Status: CP
Start: 2024-02-17 — End: 2024-03-09

## 2024-02-18 DIAGNOSIS — Z170 Estrogen receptor positive status [ER+]: Principal | ICD-10-CM

## 2024-02-18 DIAGNOSIS — C7931 Secondary malignant neoplasm of brain: Principal | ICD-10-CM

## 2024-02-18 DIAGNOSIS — C50912 Malignant neoplasm of unspecified site of left female breast: Principal | ICD-10-CM

## 2024-02-19 DIAGNOSIS — F411 Generalized anxiety disorder: Secondary | ICD-10-CM | POA: Diagnosis not present

## 2024-02-20 DIAGNOSIS — F411 Generalized anxiety disorder: Secondary | ICD-10-CM | POA: Diagnosis not present

## 2024-02-22 DIAGNOSIS — C7931 Secondary malignant neoplasm of brain: Principal | ICD-10-CM

## 2024-02-22 DIAGNOSIS — C50912 Malignant neoplasm of unspecified site of left female breast: Principal | ICD-10-CM

## 2024-02-22 DIAGNOSIS — Z170 Estrogen receptor positive status [ER+]: Principal | ICD-10-CM

## 2024-02-24 DIAGNOSIS — C7931 Secondary malignant neoplasm of brain: Principal | ICD-10-CM

## 2024-02-24 DIAGNOSIS — Z170 Estrogen receptor positive status [ER+]: Principal | ICD-10-CM

## 2024-02-24 DIAGNOSIS — C50912 Malignant neoplasm of unspecified site of left female breast: Principal | ICD-10-CM

## 2024-02-26 DIAGNOSIS — K219 Gastro-esophageal reflux disease without esophagitis: Principal | ICD-10-CM

## 2024-02-26 MED ORDER — OMEPRAZOLE 40 MG CAPSULE,DELAYED RELEASE
ORAL_CAPSULE | Freq: Every day | ORAL | 3 refills | 0 days
Start: 2024-02-26 — End: ?

## 2024-02-29 MED ORDER — OMEPRAZOLE 40 MG CAPSULE,DELAYED RELEASE
ORAL_CAPSULE | Freq: Every day | ORAL | 3 refills | 30.00 days
Start: 2024-02-29 — End: ?

## 2024-03-03 DIAGNOSIS — C50912 Malignant neoplasm of unspecified site of left female breast: Principal | ICD-10-CM

## 2024-03-03 DIAGNOSIS — Z170 Estrogen receptor positive status [ER+]: Principal | ICD-10-CM

## 2024-03-03 DIAGNOSIS — C7931 Secondary malignant neoplasm of brain: Principal | ICD-10-CM

## 2024-03-05 DIAGNOSIS — Z419 Encounter for procedure for purposes other than remedying health state, unspecified: Secondary | ICD-10-CM | POA: Diagnosis not present

## 2024-03-07 ENCOUNTER — Inpatient Hospital Stay: Admit: 2024-03-07 | Discharge: 2024-03-07 | Payer: Medicaid (Managed Care)

## 2024-03-07 ENCOUNTER — Ambulatory Visit
Admit: 2024-03-07 | Discharge: 2024-03-07 | Payer: Medicaid (Managed Care) | Attending: Adult Health | Primary: Adult Health

## 2024-03-07 DIAGNOSIS — Z170 Estrogen receptor positive status [ER+]: Secondary | ICD-10-CM | POA: Diagnosis not present

## 2024-03-07 DIAGNOSIS — C50912 Malignant neoplasm of unspecified site of left female breast: Secondary | ICD-10-CM | POA: Diagnosis not present

## 2024-03-07 DIAGNOSIS — R928 Other abnormal and inconclusive findings on diagnostic imaging of breast: Secondary | ICD-10-CM | POA: Diagnosis not present

## 2024-03-08 DIAGNOSIS — C50912 Malignant neoplasm of unspecified site of left female breast: Principal | ICD-10-CM

## 2024-03-08 DIAGNOSIS — C7931 Secondary malignant neoplasm of brain: Principal | ICD-10-CM

## 2024-03-08 DIAGNOSIS — Z170 Estrogen receptor positive status [ER+]: Principal | ICD-10-CM

## 2024-03-09 ENCOUNTER — Other Ambulatory Visit: Admit: 2024-03-09 | Discharge: 2024-03-10 | Payer: Medicaid (Managed Care)

## 2024-03-09 ENCOUNTER — Ambulatory Visit: Admit: 2024-03-09 | Discharge: 2024-03-10 | Payer: Medicaid (Managed Care)

## 2024-03-09 ENCOUNTER — Inpatient Hospital Stay: Admit: 2024-03-09 | Discharge: 2024-03-10 | Payer: Medicaid (Managed Care)

## 2024-03-09 DIAGNOSIS — Z006 Encounter for examination for normal comparison and control in clinical research program: Secondary | ICD-10-CM | POA: Diagnosis not present

## 2024-03-09 DIAGNOSIS — C7931 Secondary malignant neoplasm of brain: Secondary | ICD-10-CM | POA: Diagnosis not present

## 2024-03-09 DIAGNOSIS — Z170 Estrogen receptor positive status [ER+]: Secondary | ICD-10-CM | POA: Diagnosis not present

## 2024-03-09 DIAGNOSIS — K0510 Chronic gingivitis, plaque induced: Secondary | ICD-10-CM | POA: Diagnosis not present

## 2024-03-09 DIAGNOSIS — C50912 Malignant neoplasm of unspecified site of left female breast: Secondary | ICD-10-CM | POA: Diagnosis not present

## 2024-03-09 DIAGNOSIS — K219 Gastro-esophageal reflux disease without esophagitis: Principal | ICD-10-CM

## 2024-03-09 MED ORDER — STUDY BRE18-360 OLAPARIB 100 MG TABLET
PACK | Freq: Two times a day (BID) | ORAL | 0 refills | 30.00 days | Status: CP
Start: 2024-03-09 — End: 2024-03-30

## 2024-03-09 MED ORDER — ZZ IMS TEMPLATE
Freq: Two times a day (BID) | ORAL | 0 refills | 21.00 days | Status: CN
Start: 2024-03-09 — End: 2024-03-30

## 2024-03-09 MED ORDER — STUDY BRE18-360 OLAPARIB 150 MG TABLET
PACK | Freq: Two times a day (BID) | ORAL | 0 refills | 30.00 days | Status: CP
Start: 2024-03-09 — End: 2024-03-30

## 2024-03-09 MED ORDER — OMEPRAZOLE 40 MG CAPSULE,DELAYED RELEASE
ORAL_CAPSULE | Freq: Every day | ORAL | 3 refills | 30.00 days | Status: CP
Start: 2024-03-09 — End: 2025-03-09

## 2024-03-10 DIAGNOSIS — R5383 Other fatigue: Principal | ICD-10-CM

## 2024-03-10 DIAGNOSIS — Z170 Estrogen receptor positive status [ER+]: Principal | ICD-10-CM

## 2024-03-10 DIAGNOSIS — C7931 Secondary malignant neoplasm of brain: Principal | ICD-10-CM

## 2024-03-10 DIAGNOSIS — C50912 Malignant neoplasm of unspecified site of left female breast: Principal | ICD-10-CM

## 2024-03-11 DIAGNOSIS — C50912 Malignant neoplasm of unspecified site of left female breast: Principal | ICD-10-CM

## 2024-03-11 DIAGNOSIS — C7931 Secondary malignant neoplasm of brain: Principal | ICD-10-CM

## 2024-03-11 DIAGNOSIS — Z170 Estrogen receptor positive status [ER+]: Principal | ICD-10-CM

## 2024-03-14 DIAGNOSIS — Z170 Estrogen receptor positive status [ER+]: Principal | ICD-10-CM

## 2024-03-14 DIAGNOSIS — C50912 Malignant neoplasm of unspecified site of left female breast: Principal | ICD-10-CM

## 2024-03-14 DIAGNOSIS — C7931 Secondary malignant neoplasm of brain: Principal | ICD-10-CM

## 2024-03-15 MED ORDER — LACOSAMIDE 150 MG TABLET
ORAL_TABLET | Freq: Two times a day (BID) | ORAL | 6 refills | 90.00 days | Status: CP
Start: 2024-03-15 — End: ?

## 2024-03-16 DIAGNOSIS — C50912 Malignant neoplasm of unspecified site of left female breast: Principal | ICD-10-CM

## 2024-03-16 DIAGNOSIS — C7931 Secondary malignant neoplasm of brain: Principal | ICD-10-CM

## 2024-03-16 DIAGNOSIS — Z170 Estrogen receptor positive status [ER+]: Principal | ICD-10-CM

## 2024-03-17 DIAGNOSIS — C7931 Secondary malignant neoplasm of brain: Principal | ICD-10-CM

## 2024-03-17 DIAGNOSIS — C50912 Malignant neoplasm of unspecified site of left female breast: Principal | ICD-10-CM

## 2024-03-17 DIAGNOSIS — Z170 Estrogen receptor positive status [ER+]: Principal | ICD-10-CM

## 2024-03-21 DIAGNOSIS — Z170 Estrogen receptor positive status [ER+]: Principal | ICD-10-CM

## 2024-03-21 DIAGNOSIS — C7931 Secondary malignant neoplasm of brain: Principal | ICD-10-CM

## 2024-03-21 DIAGNOSIS — C50912 Malignant neoplasm of unspecified site of left female breast: Principal | ICD-10-CM

## 2024-03-23 DIAGNOSIS — C7931 Secondary malignant neoplasm of brain: Principal | ICD-10-CM

## 2024-03-23 DIAGNOSIS — C50912 Malignant neoplasm of unspecified site of left female breast: Principal | ICD-10-CM

## 2024-03-23 DIAGNOSIS — Z170 Estrogen receptor positive status [ER+]: Principal | ICD-10-CM

## 2024-03-28 DIAGNOSIS — C50912 Malignant neoplasm of unspecified site of left female breast: Principal | ICD-10-CM

## 2024-03-28 DIAGNOSIS — Z170 Estrogen receptor positive status [ER+]: Principal | ICD-10-CM

## 2024-03-28 DIAGNOSIS — C7931 Secondary malignant neoplasm of brain: Principal | ICD-10-CM

## 2024-03-29 DIAGNOSIS — Z170 Estrogen receptor positive status [ER+]: Principal | ICD-10-CM

## 2024-03-29 DIAGNOSIS — C50912 Malignant neoplasm of unspecified site of left female breast: Principal | ICD-10-CM

## 2024-03-29 DIAGNOSIS — C7931 Secondary malignant neoplasm of brain: Principal | ICD-10-CM

## 2024-03-30 ENCOUNTER — Ambulatory Visit: Admit: 2024-03-30 | Discharge: 2024-03-31 | Payer: Medicaid (Managed Care)

## 2024-03-30 ENCOUNTER — Other Ambulatory Visit: Admit: 2024-03-30 | Discharge: 2024-03-31 | Payer: Medicaid (Managed Care)

## 2024-03-30 ENCOUNTER — Inpatient Hospital Stay: Admit: 2024-03-30 | Discharge: 2024-03-31 | Payer: Medicaid (Managed Care)

## 2024-03-30 DIAGNOSIS — C50912 Malignant neoplasm of unspecified site of left female breast: Principal | ICD-10-CM

## 2024-03-30 DIAGNOSIS — Z170 Estrogen receptor positive status [ER+]: Principal | ICD-10-CM

## 2024-03-30 DIAGNOSIS — C7931 Secondary malignant neoplasm of brain: Principal | ICD-10-CM

## 2024-03-30 DIAGNOSIS — R079 Chest pain, unspecified: Principal | ICD-10-CM

## 2024-03-30 MED ORDER — ZZ IMS TEMPLATE
Freq: Two times a day (BID) | ORAL | 0 refills | 1.00000 days | Status: CN
Start: 2024-03-30 — End: 2024-04-20

## 2024-03-30 MED ORDER — STUDY BRE18-360 OLAPARIB 100 MG TABLET
PACK | Freq: Two times a day (BID) | ORAL | 0 refills | 30.00000 days | Status: CP
Start: 2024-03-30 — End: 2024-04-20

## 2024-03-30 MED ORDER — STUDY BRE18-360 OLAPARIB 150 MG TABLET
PACK | Freq: Two times a day (BID) | ORAL | 0 refills | 30.00000 days | Status: CP
Start: 2024-03-30 — End: 2024-04-20

## 2024-04-01 ENCOUNTER — Inpatient Hospital Stay: Admit: 2024-04-01 | Discharge: 2024-04-02 | Payer: Medicaid (Managed Care)

## 2024-04-04 DIAGNOSIS — Z419 Encounter for procedure for purposes other than remedying health state, unspecified: Secondary | ICD-10-CM | POA: Diagnosis not present

## 2024-04-04 DIAGNOSIS — Z170 Estrogen receptor positive status [ER+]: Principal | ICD-10-CM

## 2024-04-04 DIAGNOSIS — C7931 Secondary malignant neoplasm of brain: Principal | ICD-10-CM

## 2024-04-04 DIAGNOSIS — C50912 Malignant neoplasm of unspecified site of left female breast: Principal | ICD-10-CM

## 2024-04-07 ENCOUNTER — Ambulatory Visit: Admit: 2024-04-07 | Discharge: 2024-04-08 | Payer: Medicaid (Managed Care)

## 2024-04-07 DIAGNOSIS — C7931 Secondary malignant neoplasm of brain: Principal | ICD-10-CM

## 2024-04-07 DIAGNOSIS — G40109 Localization-related (focal) (partial) symptomatic epilepsy and epileptic syndromes with simple partial seizures, not intractable, without status epilepticus: Principal | ICD-10-CM

## 2024-04-07 MED ORDER — LEVETIRACETAM 750 MG TABLET
ORAL_TABLET | Freq: Two times a day (BID) | ORAL | 3 refills | 90.00000 days | Status: CP
Start: 2024-04-07 — End: ?

## 2024-04-08 DIAGNOSIS — C50912 Malignant neoplasm of unspecified site of left female breast: Principal | ICD-10-CM

## 2024-04-08 DIAGNOSIS — C7931 Secondary malignant neoplasm of brain: Principal | ICD-10-CM

## 2024-04-08 DIAGNOSIS — Z170 Estrogen receptor positive status [ER+]: Principal | ICD-10-CM

## 2024-04-11 ENCOUNTER — Ambulatory Visit: Admit: 2024-04-11 | Payer: Medicaid (Managed Care)

## 2024-04-19 ENCOUNTER — Inpatient Hospital Stay: Admit: 2024-04-19 | Discharge: 2024-04-19 | Payer: Medicaid (Managed Care)

## 2024-04-19 DIAGNOSIS — Z170 Estrogen receptor positive status [ER+]: Principal | ICD-10-CM

## 2024-04-19 DIAGNOSIS — C7931 Secondary malignant neoplasm of brain: Principal | ICD-10-CM

## 2024-04-19 DIAGNOSIS — C50912 Malignant neoplasm of unspecified site of left female breast: Principal | ICD-10-CM

## 2024-04-20 ENCOUNTER — Ambulatory Visit: Admit: 2024-04-20 | Discharge: 2024-04-21 | Payer: Medicaid (Managed Care)

## 2024-04-20 ENCOUNTER — Inpatient Hospital Stay
Admit: 2024-04-20 | Discharge: 2024-04-23 | Payer: Medicaid (Managed Care) | Attending: Radiation Oncology | Primary: Radiation Oncology

## 2024-04-20 ENCOUNTER — Other Ambulatory Visit: Admit: 2024-04-20 | Discharge: 2024-04-21 | Payer: Medicaid (Managed Care)

## 2024-04-20 DIAGNOSIS — C50912 Malignant neoplasm of unspecified site of left female breast: Principal | ICD-10-CM

## 2024-04-20 DIAGNOSIS — Z170 Estrogen receptor positive status [ER+]: Principal | ICD-10-CM

## 2024-04-20 DIAGNOSIS — C7931 Secondary malignant neoplasm of brain: Principal | ICD-10-CM

## 2024-04-21 DIAGNOSIS — Z170 Estrogen receptor positive status [ER+]: Principal | ICD-10-CM

## 2024-04-21 DIAGNOSIS — C7931 Secondary malignant neoplasm of brain: Principal | ICD-10-CM

## 2024-04-21 DIAGNOSIS — C50912 Malignant neoplasm of unspecified site of left female breast: Principal | ICD-10-CM

## 2024-04-22 DIAGNOSIS — Z170 Estrogen receptor positive status [ER+]: Principal | ICD-10-CM

## 2024-04-22 DIAGNOSIS — C7931 Secondary malignant neoplasm of brain: Principal | ICD-10-CM

## 2024-04-22 DIAGNOSIS — C50912 Malignant neoplasm of unspecified site of left female breast: Principal | ICD-10-CM

## 2024-04-26 ENCOUNTER — Encounter: Admit: 2024-04-26 | Discharge: 2024-04-27 | Payer: Medicaid (Managed Care)

## 2024-04-26 DIAGNOSIS — C7931 Secondary malignant neoplasm of brain: Principal | ICD-10-CM

## 2024-04-26 DIAGNOSIS — C50912 Malignant neoplasm of unspecified site of left female breast: Principal | ICD-10-CM

## 2024-04-26 DIAGNOSIS — R590 Localized enlarged lymph nodes: Principal | ICD-10-CM

## 2024-04-26 DIAGNOSIS — Z170 Estrogen receptor positive status [ER+]: Principal | ICD-10-CM

## 2024-04-27 DIAGNOSIS — C50912 Malignant neoplasm of unspecified site of left female breast: Principal | ICD-10-CM

## 2024-04-27 DIAGNOSIS — C7931 Secondary malignant neoplasm of brain: Principal | ICD-10-CM

## 2024-04-27 DIAGNOSIS — Z170 Estrogen receptor positive status [ER+]: Principal | ICD-10-CM

## 2024-04-28 DIAGNOSIS — Z170 Estrogen receptor positive status [ER+]: Principal | ICD-10-CM

## 2024-04-28 DIAGNOSIS — C50912 Malignant neoplasm of unspecified site of left female breast: Principal | ICD-10-CM

## 2024-04-28 DIAGNOSIS — C7931 Secondary malignant neoplasm of brain: Principal | ICD-10-CM

## 2024-04-29 DIAGNOSIS — Z170 Estrogen receptor positive status [ER+]: Principal | ICD-10-CM

## 2024-04-29 DIAGNOSIS — C50912 Malignant neoplasm of unspecified site of left female breast: Principal | ICD-10-CM

## 2024-04-29 DIAGNOSIS — C7931 Secondary malignant neoplasm of brain: Principal | ICD-10-CM

## 2024-05-02 ENCOUNTER — Ambulatory Visit: Admit: 2024-05-02 | Payer: Medicaid (Managed Care)

## 2024-05-02 DIAGNOSIS — Z170 Estrogen receptor positive status [ER+]: Principal | ICD-10-CM

## 2024-05-02 DIAGNOSIS — C7931 Secondary malignant neoplasm of brain: Principal | ICD-10-CM

## 2024-05-02 DIAGNOSIS — C50912 Malignant neoplasm of unspecified site of left female breast: Principal | ICD-10-CM

## 2024-05-03 ENCOUNTER — Inpatient Hospital Stay: Admit: 2024-05-03 | Discharge: 2024-05-04 | Payer: Medicaid (Managed Care)

## 2024-05-03 DIAGNOSIS — C50912 Malignant neoplasm of unspecified site of left female breast: Principal | ICD-10-CM

## 2024-05-03 DIAGNOSIS — C7931 Secondary malignant neoplasm of brain: Principal | ICD-10-CM

## 2024-05-03 DIAGNOSIS — Z170 Estrogen receptor positive status [ER+]: Principal | ICD-10-CM

## 2024-05-04 ENCOUNTER — Encounter: Admit: 2024-05-04 | Discharge: 2024-05-04 | Payer: Medicaid (Managed Care)

## 2024-05-04 ENCOUNTER — Inpatient Hospital Stay: Admit: 2024-05-04 | Discharge: 2024-05-04 | Payer: Medicaid (Managed Care)

## 2024-05-04 DIAGNOSIS — C7931 Secondary malignant neoplasm of brain: Principal | ICD-10-CM

## 2024-05-04 DIAGNOSIS — Z170 Estrogen receptor positive status [ER+]: Principal | ICD-10-CM

## 2024-05-04 DIAGNOSIS — C50912 Malignant neoplasm of unspecified site of left female breast: Principal | ICD-10-CM

## 2024-05-10 DIAGNOSIS — C7931 Secondary malignant neoplasm of brain: Principal | ICD-10-CM

## 2024-05-10 DIAGNOSIS — Z170 Estrogen receptor positive status [ER+]: Principal | ICD-10-CM

## 2024-05-10 DIAGNOSIS — C50912 Malignant neoplasm of unspecified site of left female breast: Principal | ICD-10-CM

## 2024-05-10 MED ORDER — ONDANSETRON HCL 8 MG TABLET
ORAL_TABLET | Freq: Two times a day (BID) | ORAL | 5 refills | 15.00000 days | Status: CP
Start: 2024-05-10 — End: ?

## 2024-05-10 MED ORDER — DEXAMETHASONE 4 MG TABLET
ORAL_TABLET | Freq: Every day | ORAL | 1 refills | 30.00000 days | Status: CP
Start: 2024-05-10 — End: ?

## 2024-05-10 MED ORDER — PROCHLORPERAZINE MALEATE 10 MG TABLET
ORAL_TABLET | Freq: Four times a day (QID) | ORAL | 5 refills | 8.00000 days | Status: CP | PRN
Start: 2024-05-10 — End: ?

## 2024-05-10 MED ORDER — LOPERAMIDE 2 MG CAPSULE
ORAL_CAPSULE | ORAL | prn refills | 0.00000 days | Status: CP
Start: 2024-05-10 — End: ?

## 2024-05-11 ENCOUNTER — Ambulatory Visit: Admit: 2024-05-11 | Discharge: 2024-05-11 | Payer: Medicaid (Managed Care)

## 2024-05-11 ENCOUNTER — Other Ambulatory Visit: Admit: 2024-05-11 | Discharge: 2024-05-11 | Payer: Medicaid (Managed Care)

## 2024-05-11 ENCOUNTER — Encounter: Admit: 2024-05-11 | Discharge: 2024-05-11 | Payer: Medicaid (Managed Care)

## 2024-05-11 DIAGNOSIS — Z170 Estrogen receptor positive status [ER+]: Principal | ICD-10-CM

## 2024-05-11 DIAGNOSIS — C50912 Malignant neoplasm of unspecified site of left female breast: Principal | ICD-10-CM

## 2024-05-11 DIAGNOSIS — C7931 Secondary malignant neoplasm of brain: Principal | ICD-10-CM

## 2024-05-12 DIAGNOSIS — C50912 Malignant neoplasm of unspecified site of left female breast: Principal | ICD-10-CM

## 2024-05-12 DIAGNOSIS — Z170 Estrogen receptor positive status [ER+]: Principal | ICD-10-CM

## 2024-05-12 DIAGNOSIS — C7931 Secondary malignant neoplasm of brain: Principal | ICD-10-CM

## 2024-05-16 DIAGNOSIS — C7931 Secondary malignant neoplasm of brain: Principal | ICD-10-CM

## 2024-05-16 DIAGNOSIS — Z170 Estrogen receptor positive status [ER+]: Principal | ICD-10-CM

## 2024-05-16 DIAGNOSIS — C50912 Malignant neoplasm of unspecified site of left female breast: Principal | ICD-10-CM

## 2024-05-17 DIAGNOSIS — C7931 Secondary malignant neoplasm of brain: Principal | ICD-10-CM

## 2024-05-17 DIAGNOSIS — Z170 Estrogen receptor positive status [ER+]: Principal | ICD-10-CM

## 2024-05-17 DIAGNOSIS — C50912 Malignant neoplasm of unspecified site of left female breast: Principal | ICD-10-CM

## 2024-05-18 ENCOUNTER — Encounter: Admit: 2024-05-18 | Discharge: 2024-05-19 | Payer: MEDICARE

## 2024-05-18 ENCOUNTER — Ambulatory Visit: Admit: 2024-05-18 | Discharge: 2024-05-19 | Payer: MEDICARE

## 2024-05-18 DIAGNOSIS — C7931 Secondary malignant neoplasm of brain: Principal | ICD-10-CM

## 2024-05-18 DIAGNOSIS — C50912 Malignant neoplasm of unspecified site of left female breast: Principal | ICD-10-CM

## 2024-05-18 DIAGNOSIS — Z170 Estrogen receptor positive status [ER+]: Principal | ICD-10-CM

## 2024-05-19 DIAGNOSIS — C50912 Malignant neoplasm of unspecified site of left female breast: Principal | ICD-10-CM

## 2024-05-19 DIAGNOSIS — C7931 Secondary malignant neoplasm of brain: Principal | ICD-10-CM

## 2024-05-19 DIAGNOSIS — Z170 Estrogen receptor positive status [ER+]: Principal | ICD-10-CM

## 2024-05-20 DIAGNOSIS — C50912 Malignant neoplasm of unspecified site of left female breast: Principal | ICD-10-CM

## 2024-05-20 DIAGNOSIS — Z170 Estrogen receptor positive status [ER+]: Principal | ICD-10-CM

## 2024-05-20 DIAGNOSIS — C7931 Secondary malignant neoplasm of brain: Principal | ICD-10-CM

## 2024-05-31 ENCOUNTER — Emergency Department: Admit: 2024-05-31 | Discharge: 2024-06-01 | Disposition: A | Discharge status: Home / Self Care | Payer: MEDICARE

## 2024-05-31 DIAGNOSIS — Z170 Estrogen receptor positive status [ER+]: Principal | ICD-10-CM

## 2024-05-31 DIAGNOSIS — C50912 Malignant neoplasm of unspecified site of left female breast: Principal | ICD-10-CM

## 2024-05-31 DIAGNOSIS — C7931 Secondary malignant neoplasm of brain: Principal | ICD-10-CM

## 2024-05-31 DIAGNOSIS — L732 Hidradenitis suppurativa: Principal | ICD-10-CM

## 2024-05-31 MED ORDER — OXYCODONE-ACETAMINOPHEN 5 MG-325 MG TABLET
ORAL_TABLET | ORAL | 0 refills | 2.00000 days | Status: CP | PRN
Start: 2024-05-31 — End: 2024-06-05

## 2024-05-31 MED ORDER — SULFAMETHOXAZOLE 800 MG-TRIMETHOPRIM 160 MG TABLET
ORAL_TABLET | Freq: Two times a day (BID) | ORAL | 0 refills | 10.00000 days | Status: CP
Start: 2024-05-31 — End: 2024-06-10

## 2024-06-01 ENCOUNTER — Ambulatory Visit: Admit: 2024-06-01 | Payer: MEDICARE

## 2024-06-01 DIAGNOSIS — C7931 Secondary malignant neoplasm of brain: Principal | ICD-10-CM

## 2024-06-01 DIAGNOSIS — C50912 Malignant neoplasm of unspecified site of left female breast: Principal | ICD-10-CM

## 2024-06-01 DIAGNOSIS — Z170 Estrogen receptor positive status [ER+]: Principal | ICD-10-CM

## 2024-06-04 DIAGNOSIS — C50912 Malignant neoplasm of unspecified site of left female breast: Principal | ICD-10-CM

## 2024-06-04 DIAGNOSIS — Z170 Estrogen receptor positive status [ER+]: Principal | ICD-10-CM

## 2024-06-04 DIAGNOSIS — C7931 Secondary malignant neoplasm of brain: Principal | ICD-10-CM

## 2024-06-08 ENCOUNTER — Inpatient Hospital Stay: Admit: 2024-06-08 | Discharge: 2024-06-08 | Payer: MEDICARE

## 2024-06-08 ENCOUNTER — Ambulatory Visit: Admit: 2024-06-08 | Discharge: 2024-06-08 | Payer: MEDICARE

## 2024-06-08 ENCOUNTER — Other Ambulatory Visit: Admit: 2024-06-08 | Discharge: 2024-06-08 | Payer: MEDICARE

## 2024-06-08 DIAGNOSIS — L732 Hidradenitis suppurativa: Principal | ICD-10-CM

## 2024-06-08 DIAGNOSIS — C50912 Malignant neoplasm of unspecified site of left female breast: Principal | ICD-10-CM

## 2024-06-08 DIAGNOSIS — C7931 Secondary malignant neoplasm of brain: Principal | ICD-10-CM

## 2024-06-08 DIAGNOSIS — Z170 Estrogen receptor positive status [ER+]: Principal | ICD-10-CM

## 2024-06-08 MED ORDER — SULFAMETHOXAZOLE 800 MG-TRIMETHOPRIM 160 MG TABLET
ORAL_TABLET | Freq: Two times a day (BID) | ORAL | 0 refills | 5.00000 days | Status: CP
Start: 2024-06-08 — End: 2024-06-13

## 2024-06-09 DIAGNOSIS — Z170 Estrogen receptor positive status [ER+]: Principal | ICD-10-CM

## 2024-06-09 DIAGNOSIS — C50912 Malignant neoplasm of unspecified site of left female breast: Principal | ICD-10-CM

## 2024-06-09 DIAGNOSIS — C7931 Secondary malignant neoplasm of brain: Principal | ICD-10-CM

## 2024-06-20 ENCOUNTER — Inpatient Hospital Stay: Admit: 2024-06-20 | Discharge: 2024-06-20 | Payer: MEDICARE

## 2024-06-22 ENCOUNTER — Ambulatory Visit: Admit: 2024-06-22 | Discharge: 2024-06-23 | Payer: MEDICARE

## 2024-06-22 ENCOUNTER — Inpatient Hospital Stay: Admit: 2024-06-22 | Discharge: 2024-06-23 | Payer: MEDICARE

## 2024-06-22 ENCOUNTER — Other Ambulatory Visit: Admit: 2024-06-22 | Discharge: 2024-06-23 | Payer: MEDICARE

## 2024-06-22 DIAGNOSIS — Z170 Estrogen receptor positive status [ER+]: Principal | ICD-10-CM

## 2024-06-22 DIAGNOSIS — C7931 Secondary malignant neoplasm of brain: Principal | ICD-10-CM

## 2024-06-22 DIAGNOSIS — C50912 Malignant neoplasm of unspecified site of left female breast: Principal | ICD-10-CM

## 2024-06-22 NOTE — Unmapped (Addendum)
 Clinical Study: [id number] Phase I/II Study of Stereotactic Radiosurgery with Concurrent Administration of DNA Damage Response (DDR) Inhibitor (Olaparib ) Followed by Adjuvant Combination of Durvalumab ([id number]) and Physicians Choice Systemic Therapy in Subjects with Breast Cancer Brain Metastases   Cycle / Day: EOS Cont. (Questionnaires)  DOS: 06/22/2024  Performance Status: N/A  Subject #: [id number]  Cohort / Treatment: MD Systemic Therapy Choice - Olaparib        Medical / Surgical History  Medical History Start Date End Date Grade Related to Prior Therapy? (Y / N)   Seizure 11/01/2023 11/01/2023 3 N   Dysgeusia 11/01/2023 Ongoing 1 N   Brain metastases 11/02/2023 Ongoing - N   Breast cancer, ER+(2%), PR-, HER2- 03/03/22 Ongoing - N   Gastroesophageal reflux disease 12/30/2017 Ongoing 2 N   Hypertension 03/25/2022 11/20/2023 1 N   Cough 12/30/2017 Ongoing 2 N   Skin and subcutaneous disorders, other: Boils 1986 Ongoing 1 N   Pruritus (groin) 05/20/2023 Ongoing 1 Y   Hot flashes  05/20/2023 Ongoing 1 Y   Lymphedema, left arm 11/27/2022 Ongoing 2 Y   Hysterectomy, partial 2006 2006 - N   Lumpectomy, left, with lymph node dissection 11/27/2022 11/27/2022 - N   Peripheral sensory neuropathy (fingers) 06/21/2022 Ongoing 1 Y     Pregnancy Assessment:  OB/GYN Status: Hysterectomy  LMP: No LMP recorded. Patient has had a hysterectomy.  OB/Gyn Status Last Reviewed       Last reviewed by Thedora Rachel CROME, RN on 05/31/2024  4:30 PM          Oral Study Medication Compliance: C2D1 (First Dose Initiated on 11/20/2023)  Oral Medication # of Pills Dispensed # of Pills Taken Expected Returned Actual Returned % Compliance   C1 - Olaparib  300mg , BID  (Dispensed 11/20/2023) 120 tabs 62 58 58 48%  (Expected due to patient holding treatment for last 2 weeks of cycle)   C2 - Olaparib  250mg , BID  (Dispensed 12/17/2023) 60 tabs of 100mg  tablets; 60 tabs of 150mg  tablets   0    C3 - Olaparib  250mg , BID  (Dispensed 01/06/2024) 60 tabs of 100mg  tablets; 60 tabs of 150mg  tablets 82 36 38 97.6%   C4 - Olaparib  250mg , BID  (Dispensed 01/28/2024 60 tabs of 100mg  tablets; 60 tabs of 150mg  tablets 84 36 36 100%   C5 - Olaparib  250mg , BID  (Dispensed 02/17/2024) 60 tabs of 100mg  tablets; 60 tabs of 150mg  tablets 83 36 37 98.8%   C6 - Olaparib  250mg , BID  (Dispensed 03/09/2024) 60 tabs of 100mg  tablets; 60 tabs of 150mg  tablets       C7 - Olaparib  250mg , BID (Dispensed 03/30/2024) 60 tabs of 100mg  tablets; 60 tabs of 150mg  tablets       Date and reason for any missed dose(s):  Patient reports full compliance for this cycle (although one pill off) She brought back in her empty bottles but not her dose diaries. Patient reminded to bring them in next visit or scan and email them to myself.  Reason for discrepancies between diary and pill count: See above Reason for Missed Doses  Patient did not return previous cycle's bottles or pill diary, expressed to bring them to her next clinic visit.    New diary issued: No, not necessary, patient coming off study  Educational and/or other handouts issued: Patient instructed as follows: Olaparib  is taken twice a day by mouth with or without food at the same time each day approximately 12 hours apart with one glass of water.  Take the medication exactly as instructed. If you forget to take a dose on time and/or you vomit immediately after dosing (and if all pills are visible) you may retake the dose within 2 hours. Store this medication as directed. Patient instructed to take one (1) 100mg  tablet and one (1) 150mg  tablet together to make a 250mg  dose. She was instructed to take this dose both in the morning and at night. Patient reminded to bring back all extra pills and bottles at each visit. Patient verbalized understanding.  Patient education performed: Patient instructed to bring back diary and pills/bottles every clinic visit.    Research Narrative:   Patient seen in RadOnc for a follow up with Karilyn Pop. All EOS activities minus questionnaires were completed at prior visits. Today's visit for research purposes was to complete the end of study questionnaires required per protocol.   No other study activities performed.  Patient does not want to partake in the long-term follow-up visits with this study. She is okay with the 90 day post-dose phone call but does not want any other visits regarding the BRE18-360 study.    Verbal notification:  Verbal update regarding new information for clinical trial [id number] (Protocol number) was presented to the patient during clinic visit on 06/22/2024. The new information presented included updates to possible side effects of olaparib  as well as durvalumab, and new language to ICF for clarity.    The patient had ample time to consider continued participation in the study, in the absence of coercion or undue influence. The patient was offered an opportunity to ask questions and these questions were answered to their satisfaction. The patient verbalized understanding of information presented and wishes to continue on trial. Written informed re-consent will be conducted once documents are available.        Research Plan:   Study coordinator to call patient 90 days post-dose to go over AE's and conmeds. After this phone call, patient will officially come off study.      Adverse Events   AE Start Date End Date Grade Attribution: SRS (Radiation) Attribution: Olaparib  Attribution: Durvalumab Attribution: Other (indicate) Clinically Significant?  (N / Y: Action Taken)   Insomnia  11/02/2023 12/14/2023  2 Unrelated Unrelated - Steroids N   Insomnia 12/14/2023 01/06/2024 1 Unrelated Unrelated - Steroids N   Hypertension  11/20/2023 12/17/2023 2 Unrelated Unrelated - - N   Hypertension 12/17/2023 01/06/2024 1 Unrelated Unrelated - - N   Hypertension 01/06/2024 Ongoing 2 Unrelated Unrelated - - N   Platelet decreased 11/20/2023 01/06/2024 1 Unrelated Unrelated - Prior Treatment N   Lymphocyte decreased 11/20/2023 11/27/2023 3 Unlikely Unlikely - Prior Treatment N   Lymphocyte decreased 11/27/2023 12/17/2023 4 Unlikely Unlikely - Prior Treatment N   Blurred Vision 11/27/2023 Ongoing 1 Possible Unlikely - Tumor Location N   Skin and subcutaneous tissue disorders - Other, specify: Nonhealing wounds/cuts to hands 12/01/2023 12/17/2023 1 Unrelated Possible - - N   Alopecia 12/09/2023 Ongoing 1 Definite Unlikely - - N   Hypoalbuminemia 12/17/2023 01/06/2024 1 Unlikely Unlikely - - N   Blood lactate dehydrogenase increased  12/17/2023 01/06/2024 1 Unlikely Unlikely - - N   Muscle cramps (feet, legs, intermittently) 12/28/2023 01/16/2024 1 Unrelated Unlikely Possible - N   Rash maculo-papular 01/13/2024 01/23/2024 1 Unrelated Possible Unlikely - Yes; Valcyclovir prescribed   Anemia 02/17/2024 03/09/2024 1 Unrelated Probably Unlikely - N   Periodontal disease (gingivitis) 03/09/2024 Ongoing 1 Unrelated Possibly Unlikely - N   Seizure (facial twitching) 03/02/2024  03/02/2024 1 Unrelated Unlikely Unlikely - N   Fatigue 03/09/2024 Ongoing 1 Unrelated Probably Possibly - N   Edema face 03/06/2024 Ongoing 1 Unrelated Unlikely Unlikely - N   Blood lactate dehydrogenase increased 03/30/2024 Ongoing 1 Unrelated Unlikely Unlikely - N   Chest pain- cardiac 03/27/2024 Ongoing 1 Unrelated Unlikely Unlikely - N   All lab values and assessments were reviewed by the investigator and are considered not clinically significant unless otherwise noted.    Concomitant Medications     Medication Dose Start Date End Date Indication Related AE, if any   albuterol  HFA  90 mcg/actuation inhaler; 2 puffs q6hrs PRN      03/25/2022 Ongoing Cough      dexamethasone  4mg  BID, PO 11/02/23 12/16/2023 Seizures     levetiracetam  (KEPPRA ) 500mg  daily, PO 11/02/23 11/27/2023 Seizures     omeprazole  40mg  daily, PO 03/25/2022 Ongoing GERD     EMLA  Cream (lidocaine  and prilocaine ) 2.5%-2.5%, topical 05/07/22 Ongoing Port Access     oxybutynin  5mg , TID PRN, PO 02/03/23 Ongoing Hot flashes     levetiracetam  (KEPPRA ) 750mg  daily, PO 11/27/2023 01/13/2024 Seizures    Lacosamide  (VIMPAT ) 100mg , BID, PO 01/14/2024 Ongoing Seizures    valACYclovir  (VALTREX ) 1000mg , TID, PO 01/16/2024 01/23/2024 Rash maculo-papular    benadryl  25mg , Q6hr PRN 01/08/2024 Ongoing Rash maculo-papular          ATTENDING ATTESTATION:    I agree with the documentation in the research coordinator's note.      Marce Kallman, MD, PhD  Associate Professor  Department of Radiation Oncology  [facility]  [employer] of [employer]  [address], [id number]  [place], [place] [address]  O: [phone]  P: [phone]

## 2024-06-26 MED ORDER — LEVETIRACETAM 500 MG TABLET
ORAL_TABLET | Freq: Two times a day (BID) | ORAL | 3 refills | 90.00000 days | Status: CP
Start: 2024-06-26 — End: 2025-06-26

## 2024-06-28 DIAGNOSIS — C7931 Secondary malignant neoplasm of brain: Principal | ICD-10-CM

## 2024-06-28 DIAGNOSIS — C50912 Malignant neoplasm of unspecified site of left female breast: Principal | ICD-10-CM

## 2024-06-28 DIAGNOSIS — Z170 Estrogen receptor positive status [ER+]: Principal | ICD-10-CM

## 2024-06-29 ENCOUNTER — Inpatient Hospital Stay: Admit: 2024-06-29 | Discharge: 2024-06-29 | Payer: Medicare (Managed Care)

## 2024-06-29 ENCOUNTER — Other Ambulatory Visit: Admit: 2024-06-29 | Discharge: 2024-06-29 | Payer: Medicare (Managed Care)

## 2024-06-29 DIAGNOSIS — C7931 Secondary malignant neoplasm of brain: Principal | ICD-10-CM

## 2024-06-29 DIAGNOSIS — C50912 Malignant neoplasm of unspecified site of left female breast: Principal | ICD-10-CM

## 2024-06-29 DIAGNOSIS — D701 Agranulocytosis secondary to cancer chemotherapy: Principal | ICD-10-CM

## 2024-06-29 DIAGNOSIS — T451X5A Adverse effect of antineoplastic and immunosuppressive drugs, initial encounter: Principal | ICD-10-CM

## 2024-06-29 DIAGNOSIS — Z170 Estrogen receptor positive status [ER+]: Principal | ICD-10-CM

## 2024-06-29 MED ORDER — PEGFILGRASTIM-APGF 6 MG/0.6 ML SUBCUTANEOUS SYRINGE
SUBCUTANEOUS | 5 refills | 21.00000 days | Status: CP
Start: 2024-06-29 — End: ?
  Filled 2024-07-07: qty 0.6, 21d supply, fill #0

## 2024-06-30 DIAGNOSIS — C7931 Secondary malignant neoplasm of brain: Principal | ICD-10-CM

## 2024-06-30 DIAGNOSIS — Z170 Estrogen receptor positive status [ER+]: Principal | ICD-10-CM

## 2024-06-30 DIAGNOSIS — C50912 Malignant neoplasm of unspecified site of left female breast: Principal | ICD-10-CM

## 2024-07-01 DIAGNOSIS — Z170 Estrogen receptor positive status [ER+]: Principal | ICD-10-CM

## 2024-07-01 DIAGNOSIS — C7931 Secondary malignant neoplasm of brain: Principal | ICD-10-CM

## 2024-07-01 DIAGNOSIS — C50912 Malignant neoplasm of unspecified site of left female breast: Principal | ICD-10-CM

## 2024-07-01 NOTE — Unmapped (Signed)
 [facility] Specialty Medication Onboarding    Specialty Medication: NYVEPRIA  6 mg/0.6 mL injection (pegfilgrastim -apgf)  Prior Authorization: Not Required   Financial Assistance: No - copay  <$25  Final Copay/Day Supply: $0.00 / 21 days    Insurance Restrictions: None     Notes to Pharmacist: none  Credit Card on File: no  Start Date on Rx:  06/29/24    The triage team has completed the benefits investigation and has determined that the patient is able to fill this medication at [facility]. Please contact the patient to complete the onboarding or follow up with the prescribing physician as needed.

## 2024-07-05 NOTE — Unmapped (Signed)
 [facility]    Patient Onboarding/Medication Counseling    Erin Gordon is a 48 y.o. 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  I am speaking to the patient.    Was a Nurse, learning disability used for this call? No    Verified patient's date of birth / HIPAA.    Specialty medication(s) to be sent: Hematology/Oncology: Nyvepria       Non-specialty medications/supplies to be sent: n/a      Medications not needed at this time: n/a         Nyvepria  (pegfilgrastim -apgf)    Medication & Administration     Dosage: Inejct contents of 1 syringe under the skin every 21 days (day 9 of cycle)    Administration: Inject under the skin of the thigh, abdomen, buttocks or upper arm. Rotate sites with each injection.  Injection instructions   Take 1 syringe out of the refrigerator and allow to stand at room temperature for at least 30 minutes  Wash hands and remove syringe from the tray  Check the syringe for the following   Expiration date  Medication is clear and colorless and free from particles   It appears unused or damaged and the needle cap is securely attached and the needle guard has not been activated (if the needle guard is covering the needle that means it has been activated)  Choose your injection site (abdomen but not within 2 inches of navel, thigh or if someone else is injecting may also use upper arms or upper outer area of buttocks)  Clean the injection site with an alcohol wipe using a circular motion and allow to air dry completely  Hold the syringe by grabbing the sides of the needle guard with your thumb and forefinger.  Pull the needle cap straight off and discard  Pinch the skin to create a firm surface and insert the needle at a 45-90 degree angle (keep skin pinched while injecting)  Using slow and constant pressure, push the plunger rod until it reaches the bottom (the plunger must be pushed fully in order to inject the full dose)  When the syringe is empty, keep the plunger rod fully depressed while you pull the needle out of the skin.  Slowly release the plunger rod and allow the needle guard to automatically cover the exposed needle. activate the needle guard.   Dispose of the used prefilled syringe into a sharps container or hard plastic bottle.  If there is blood at the injection site gently press a cotton ball or gauze to the site. Do not rub the injection site.    Adherence/Missed dose instruction: If a dose is missed, call your doctor.    Goals of Therapy     Stimulate the growth of neutrophils (a type of white blood important to fight against infection) used after chemotherapy.    Side Effects & Monitoring Parameters   Injection site irritation  Pain/aching in the bones, arms and legs    The following side effects should be reported to the provider:  Signs of an allergic reaction    Contraindications, Warnings, & Precautions     Hypersensitivity    Drug/Food Interactions     Medication list reviewed in Epic. The patient was instructed to inform the care team before taking any new medications or supplements. No drug interactions identified.     Storage, Handling Precautions, & Disposal     Nyvepria  should be stored in the refrigerator.  Avoid freezing syringe but if frozen may be thawed one time  May be kept at room temperature for up to 15 days  Do not shake the prefilled syringe  Keep out of the reach of children  Place used devices into a sharps container for disposal (which we can supply along with band-aids and alcohol pads) or hard plastic container       Current Medications (including OTC/herbals), Comorbidities and Allergies     Current Medications[1]    Allergies[2]    Problem List[3]    Medication list has been reviewed and updated in Epic: Yes    Allergies have been reviewed and updated in Epic: Yes    Appropriateness of Therapy     Acute infections noted within Epic:  No active infections  Patient reported infection: None    Is the medication and dose appropriate based on diagnosis, medication list, comorbidities, allergies, medical history, patient???s ability to self-administer the medication, and therapeutic goals? Yes    Prescription has been clinically reviewed: Yes      Baseline Quality of Life Assessment      How many days over the past month did your condition  keep you from your normal activities? For example, brushing your teeth or getting up in the morning. 0    Financial Information     Medication Assistance provided: None Required    Anticipated copay of $0 reviewed with patient. Verified delivery address.    Delivery Information     Scheduled delivery date: 07/07/24    Expected start date: 07/07/24      Medication will be delivered via Same Day Courier to the prescription address in [facility].  This shipment will not require a signature.      Explained the services we provide at [facility] and that each month we would call to set up refills.  Stressed importance of returning phone calls so that we could ensure they receive their medications in time each month.  Informed patient that we should be setting up refills 7-10 days prior to when they will run out of medication.  A pharmacist will reach out to perform a clinical assessment periodically.  Informed patient that a welcome packet, containing information about our pharmacy and other support services, a Notice of Privacy Practices, and a drug information handout will be sent.      The patient or caregiver noted above participated in the development of this care plan and knows that they can request review of or adjustments to the care plan at any time.      Patient or caregiver verbalized understanding of the above information as well as how to contact the pharmacy at [phone] option 4 with any questions/concerns.  The pharmacy is open Monday through Friday 8:30am-4:30pm.  A pharmacist is available 24/7 via pager to answer any clinical questions they may have.    Patient Specific Needs     Does the patient have any physical, cognitive, or cultural barriers? No    Does the patient have adequate living arrangements? (i.e. the ability to store and take their medication appropriately) Yes    Did you identify any home environmental safety or security hazards? No    Patient prefers to have medications discussed with  Patient     Is the patient or caregiver able to read and understand education materials at a high school level or above? Yes    Patient's primary language is  [place]  Is the patient high risk? No    Does the patient have an additional or emergency contact listed in their chart? Yes    SOCIAL DETERMINANTS OF HEALTH     At the [facility], we have learned that life circumstances - like trouble affording food, housing, utilities, or transportation can affect the health of many of our patients.   That is why we wanted to ask: are you currently experiencing any life circumstances that are negatively impacting your health and/or quality of life? No    Social Drivers of Health     Food Insecurity: Food Insecurity Present (03/21/2022)    Hunger Vital Sign     Worried About Running Out of Food in the Last Year: Sometimes true     Ran Out of Food in the Last Year: Sometimes true   Tobacco Use: High Risk (04/07/2024)    Patient History     Smoking Tobacco Use: Every Day     Smokeless Tobacco Use: Never     Passive Exposure: Not on file   Transportation Needs: Unmet Transportation Needs (03/21/2022)    PRAPARE - Transportation     Lack of Transportation (Medical): No     Lack of Transportation (Non-Medical): Yes   Alcohol Use: Not on file   Housing: Not on file   Physical Activity: Not on file   Utilities: Not on file   Stress: Not on file   Interpersonal Safety: Unknown (05/04/2024)    Interpersonal Safety     Unsafe Where You Currently Live: No     Physically Hurt by Anyone: Not on file     Abused by Anyone: Not on file   Substance Use: Not on file (09/29/2023) Intimate Partner Violence: Not on file   Social Connections: Not on file   Financial Resource Strain: High Risk (03/21/2022)    Overall Financial Resource Strain (CARDIA)     Difficulty of Paying Living Expenses: Hard   Health Literacy: Low Risk  (04/26/2021)    Health Literacy     : Never   Internet Connectivity: Not on file       Would you be willing to receive help with any of the needs that you have identified today? Not applicable       KATE MUNSTER, [facility]  [facility] Specialty Pharmacist       [1]   Current Outpatient Medications   Medication Sig Dispense Refill    albuterol  HFA 90 mcg/actuation inhaler Inhale 2 puffs every six (6) hours as needed for wheezing. 8 g 2    aspirin 325 MG tablet Take 1 tablet (325 mg total) by mouth daily. (Patient not taking: Reported on 06/22/2024)      dexAMETHasone  (DECADRON ) 4 MG tablet Take 2 tablets (8 mg total) by mouth daily. Take every morning on Days 2, 3, and 4 and 9, 10 and 11. 60 tablet 1    hydrocortisone  (ANUSOL -HC) 2.5 % rectal cream Insert into the rectum two (2) times a day. 30 g 5    levETIRAcetam  (KEPPRA ) 500 MG tablet Take 1 tablet (500 mg total) by mouth two (2) times a day. 180 tablet 3    loperamide  (IMODIUM ) 2 mg capsule Take 2 capsules to start, then 1 capsule every 2 hours until diarrhea free for 12 hours. 60 capsule prn    omeprazole  (PRILOSEC) 40 MG capsule Take 1 capsule (40 mg total) by mouth daily. 30 capsule 3    ondansetron  (ZOFRAN ) 8  MG tablet Take 1 tablet (8 mg total) by mouth every eight (8) hours as needed for nausea. 30 tablet 2    ondansetron  (ZOFRAN ) 8 MG tablet Take 1 tablet (8 mg total) by mouth two (2) times a day. Take on Days 2, 3, and 4 and 9, 10 and 11; And then, 8 mg every 8 hours as needed. 30 tablet 5    pegfilgrastim -apgf (NYVEPRIA ) 6 mg/0.6 mL injection Inject 0.6 mL (6 mg total) under the skin every twenty-one (21) days. Administer on day 9 of every 21 day cycle. (Chemo is D1, 8 every 21 days) 0.6 mL 5 prochlorperazine  (COMPAZINE ) 10 MG tablet Take 1 tablet (10 mg total) by mouth every six (6) hours as needed (nausea). 30 tablet 5     No current facility-administered medications for this visit.   [2] No Known Allergies  [3]   Patient Active Problem List  Diagnosis    Hypertension    Tobacco abuse    Gastroesophageal reflux disease    Abnormal EKG    Atypical chest pain    Polysubstance abuse    Class 2 severe obesity due to excess calories with serious comorbidity in adult    Mass of lower outer quadrant of left breast    Mass of left axilla    Malignant neoplasm of left breast in female, estrogen receptor positive       Malignant neoplasm of left breast in female, estrogen receptor positive, unspecified site of breast       Hypomagnesemia    Metastasis to brain       Mediastinal adenopathy

## 2024-07-06 ENCOUNTER — Inpatient Hospital Stay: Admit: 2024-07-06 | Discharge: 2024-07-06 | Payer: Medicare (Managed Care)

## 2024-07-06 ENCOUNTER — Other Ambulatory Visit: Admit: 2024-07-06 | Discharge: 2024-07-06 | Payer: Medicare (Managed Care)

## 2024-07-06 DIAGNOSIS — Z170 Estrogen receptor positive status [ER+]: Principal | ICD-10-CM

## 2024-07-06 DIAGNOSIS — C50912 Malignant neoplasm of unspecified site of left female breast: Principal | ICD-10-CM

## 2024-07-06 DIAGNOSIS — C7931 Secondary malignant neoplasm of brain: Principal | ICD-10-CM

## 2024-07-06 LAB — MAGNESIUM: MAGNESIUM: 1.7 mg/dL (ref 1.6–2.6)

## 2024-07-06 LAB — CBC W/ AUTO DIFF
HEMATOCRIT: 30.8 % — ABNORMAL LOW (ref 34.0–44.0)
HEMOGLOBIN: 9.9 g/dL — ABNORMAL LOW (ref 11.3–14.9)
MEAN CORPUSCULAR HEMOGLOBIN CONC: 32.2 g/dL (ref 32.0–36.0)
MEAN CORPUSCULAR HEMOGLOBIN: 23.9 pg — ABNORMAL LOW (ref 25.9–32.4)
MEAN CORPUSCULAR VOLUME: 74.3 fL — ABNORMAL LOW (ref 77.6–95.7)
MEAN PLATELET VOLUME: 7.7 fL (ref 6.8–10.7)
PLATELET COUNT: 243 10*9/L (ref 150–450)
RED BLOOD CELL COUNT: 4.14 10*12/L (ref 3.95–5.13)
RED CELL DISTRIBUTION WIDTH: 20.6 % — ABNORMAL HIGH (ref 12.2–15.2)
WBC ADJUSTED: 4.8 10*9/L (ref 3.6–11.2)

## 2024-07-06 LAB — COMPREHENSIVE METABOLIC PANEL
ALBUMIN: 3.3 g/dL — ABNORMAL LOW (ref 3.4–5.0)
ALKALINE PHOSPHATASE: 69 U/L (ref 46–116)
ALT (SGPT): 27 U/L (ref 10–49)
ANION GAP: 13 mmol/L (ref 5–14)
AST (SGOT): 12 U/L (ref <=34)
BILIRUBIN TOTAL: 0.3 mg/dL (ref 0.3–1.2)
BLOOD UREA NITROGEN: 11 mg/dL (ref 9–23)
BUN / CREAT RATIO: 19
CALCIUM: 9.1 mg/dL (ref 8.7–10.4)
CHLORIDE: 104 mmol/L (ref 98–107)
CO2: 24 mmol/L (ref 20.0–31.0)
CREATININE: 0.58 mg/dL (ref 0.55–1.02)
EGFR CKD-EPI (2021) FEMALE: >90 mL/min/1.73m2 (ref >=60)
GLUCOSE RANDOM: 165 mg/dL (ref 70–179)
POTASSIUM: 3.7 mmol/L (ref 3.4–4.8)
PROTEIN TOTAL: 6.9 g/dL (ref 5.7–8.2)
SODIUM: 141 mmol/L (ref 135–145)

## 2024-07-06 LAB — MANUAL DIFFERENTIAL
BASOPHILS - ABS (DIFF): 0 10*9/L (ref 0.0–0.1)
BASOPHILS - REL (DIFF): 0 %
EOSINOPHILS - ABS (DIFF): 0 10*9/L (ref 0.0–0.5)
EOSINOPHILS - REL (DIFF): 0 %
LYMPHOCYTES - ABS (DIFF): 1.2 10*9/L (ref 1.1–3.6)
LYMPHOCYTES - REL (DIFF): 24 %
MONOCYTES - ABS (DIFF): 0.4 10*9/L (ref 0.3–0.8)
MONOCYTES - REL (DIFF): 8 %
NEUTROPHILS - ABS (DIFF): 3.3 10*9/L (ref 1.8–7.8)
NEUTROPHILS - REL (DIFF): 68 %

## 2024-07-06 LAB — PHOSPHORUS: PHOSPHORUS: 3.2 mg/dL (ref 2.4–5.1)

## 2024-07-06 MED ADMIN — fosaprepitant (EMEND) 150 mg in sodium chloride (NS) 0.9 % 100 mL IVPB: 150 mg | INTRAVENOUS | @ 16:00:00 | Stop: 2024-07-06

## 2024-07-06 MED ADMIN — sacituzumab govitecan-hziy (TRODELVY) 994 mg in sodium chloride (non-PVC) 0.9 % 250 mL IVPB: 10 mg/kg | INTRAVENOUS | @ 17:00:00 | Stop: 2024-07-06

## 2024-07-06 MED ADMIN — ondansetron (ZOFRAN) tablet 24 mg: 24 mg | ORAL | @ 16:00:00 | Stop: 2024-07-06

## 2024-07-06 MED ADMIN — sodium chloride (NS) 0.9 % infusion: 100 mL/h | INTRAVENOUS | @ 17:00:00

## 2024-07-06 MED ADMIN — famotidine (PF) (PEPCID) injection 20 mg: 20 mg | INTRAVENOUS | @ 16:00:00 | Stop: 2024-07-06

## 2024-07-06 MED ADMIN — acetaminophen (TYLENOL) tablet 650 mg: 650 mg | ORAL | @ 16:00:00 | Stop: 2024-07-06

## 2024-07-06 MED ADMIN — dexAMETHasone (DECADRON) tablet 12 mg: 12 mg | ORAL | @ 16:00:00 | Stop: 2024-07-06

## 2024-07-06 MED ADMIN — diphenhydrAMINE (BENADRYL) capsule/tablet 25 mg: 25 mg | ORAL | @ 16:00:00 | Stop: 2024-07-06

## 2024-07-06 MED ADMIN — heparin, porcine (PF) 100 unit/mL injection 500 Units: 500 [IU] | INTRAVENOUS | @ 19:00:00 | Stop: 2024-07-07

## 2024-07-06 NOTE — Unmapped (Signed)
 Patient arrived to chair 43.  No complaints noted.  Access of port intact with + blood return.   Pt premedicated for tx.  Completed and tolerated infusion per orders.  Port deaccessed and heparin  locked with + BR prior.        AVS declined, pt left clinic ambulatory.

## 2024-07-06 NOTE — Unmapped (Signed)
 RED ZONE Means: RED ZONE: Take action now!     You need to be seen right away  Symptoms are at a severe level of discomfort    Call 911 or go to your nearest  Hospital for help     - Bleeding that will not stop    - Hard to breathe    - New seizure - Chest pain  - Fall or passing out  -Thoughts of hurting    yourself or others      Call 911 if you are going into the RED ZONE                  YELLOW ZONE Means:     Please call with any new or worsening symptom(s), even if not on this list.  Call [phone]  After hours, weekends, and holidays - you will reach a long recording with specific instructions, If not in an emergency such as above, please listen closely all the way to the end and choose the option that relates to your need.   You can be seen by a provider the same day through our Same Day Acute Care for Patients with Cancer program.      YELLOW ZONE: Take action today     Symptoms are new or worsening  You are not within your goal range for:    - Pain    - Shortness of breath    - Bleeding (nose, urine, stool, wound)    - Feeling sick to your stomach and throwing up    - Mouth sores/pain in your mouth or throat    - Hard stool or very loose stools (increase in       ostomy output)    - No urine for 12 hours    - Feeding tube or other catheter/tube issue    - Redness or pain at previous IV or port/catheter site    - Depressed or anxiety   - Swelling (leg, arm, abdomen,     face, neck)  - Skin rash or skin changes  - Wound issues (redness, drainage,    re-opened)  - Confusion  - Vision changes  - Fever >100.4 F or chills  - Worsening cough with mucus that is    green, yellow, or bloody  - Pain or burning when going to the    bathroom  - Home Infusion Pump Issue- call    [phone]         Call your healthcare provider if you are going into the YELLOW ZONE     GREEN ZONE Means:  Your symptoms are under controls  Continue to take your medicine as ordered  Keep all visits to the provider GREEN ZONE: You are in control  No increase or worsening symptoms  Able to take your medicine  Able to drink and eat    - DO NOT use MyChart messages to report red or yellow symptoms. Allow up to 3    business days for a reply.  -MyChart is for non-urgent medication refills, scheduling requests, or other general questions.         [id number] Rev. 05/23/2022  Approved by Oncology Patient Education Committee     Lab on 07/06/2024   Component Date Value Ref Range Status    Sodium 07/06/2024 141  135 - 145 mmol/L Final    Potassium 07/06/2024 3.7  3.4 - 4.8 mmol/L Final    Chloride 07/06/2024 104  98 - 107 mmol/L Final  CO2 07/06/2024 24.0  20.0 - 31.0 mmol/L Final    Anion Gap 07/06/2024 13  5 - 14 mmol/L Final    BUN 07/06/2024 11  9 - 23 mg/dL Final    Creatinine 91/86/7974 0.58  0.55 - 1.02 mg/dL Final    BUN/Creatinine Ratio 07/06/2024 19   Final    eGFR CKD-EPI (2021) Female 07/06/2024 >90  >=60 mL/min/1.33m2 Final    eGFR calculated with CKD-EPI 2021 equation in accordance with [employer] and [employer] of Nephrology Task Force recommendations.    Glucose 07/06/2024 165  70 - 179 mg/dL Final    Calcium 91/86/7974 9.1  8.7 - 10.4 mg/dL Final    Albumin 91/86/7974 3.3 (L)  3.4 - 5.0 g/dL Final    Total Protein 07/06/2024 6.9  5.7 - 8.2 g/dL Final    Total Bilirubin 07/06/2024 0.3  0.3 - 1.2 mg/dL Final    AST 91/86/7974 12  <=34 U/L Final    ALT 07/06/2024 27  10 - 49 U/L Final    Alkaline Phosphatase 07/06/2024 69  46 - 116 U/L Final    Magnesium 07/06/2024 1.7  1.6 - 2.6 mg/dL Final    Phosphorus 91/86/7974 3.2  2.4 - 5.1 mg/dL Final    WBC 91/86/7974 4.8  3.6 - 11.2 10*9/L Final    RBC 07/06/2024 4.14  3.95 - 5.13 10*12/L Final    HGB 07/06/2024 9.9 (L)  11.3 - 14.9 g/dL Final    HCT 91/86/7974 30.8 (L)  34.0 - 44.0 % Final    MCV 07/06/2024 74.3 (L)  77.6 - 95.7 fL Final    MCH 07/06/2024 23.9 (L)  25.9 - 32.4 pg Final    MCHC 07/06/2024 32.2  32.0 - 36.0 g/dL Final    RDW 91/86/7974 20.6 (H)  12.2 - 15.2 % Final    MPV 07/06/2024 7.7  6.8 - 10.7 fL Final    Platelet 07/06/2024 243  150 - 450 10*9/L Final    Microcytosis 07/06/2024 Slight (A)  Not Present Final    Anisocytosis 07/06/2024 Moderate (A)  Not Present Final    Hypochromasia 07/06/2024 Marked (A)  Not Present Final    Neutrophils % 07/06/2024 68  % Final    Lymphocytes % 07/06/2024 24  % Final    Monocytes % 07/06/2024 8  % Final    Eosinophils % 07/06/2024 0  % Final    Basophils % 07/06/2024 0  % Final    Absolute Neutrophils 07/06/2024 3.3  1.8 - 7.8 10*9/L Final    Absolute Lymphocytes 07/06/2024 1.2  1.1 - 3.6 10*9/L Final    Absolute Monocytes 07/06/2024 0.4  0.3 - 0.8 10*9/L Final    Absolute Eosinophils 07/06/2024 0.0  0.0 - 0.5 10*9/L Final    Absolute Basophils 07/06/2024 0.0  0.0 - 0.1 10*9/L Final    Smear Review Comments 07/06/2024 See Comment  Undefined Final    Slide [id number] reviewed    Ovalocytes 07/06/2024 Moderate (A)  Not Present Final    Target Cells 07/06/2024 Moderate (A)  Not Present Final    Poikilocytosis 07/06/2024 Moderate (A)  Not Present Final

## 2024-07-07 DIAGNOSIS — C7931 Secondary malignant neoplasm of brain: Principal | ICD-10-CM

## 2024-07-07 DIAGNOSIS — Z170 Estrogen receptor positive status [ER+]: Principal | ICD-10-CM

## 2024-07-07 DIAGNOSIS — C50912 Malignant neoplasm of unspecified site of left female breast: Principal | ICD-10-CM

## 2024-07-12 DIAGNOSIS — C50912 Malignant neoplasm of unspecified site of left female breast: Principal | ICD-10-CM

## 2024-07-12 DIAGNOSIS — Z170 Estrogen receptor positive status [ER+]: Principal | ICD-10-CM

## 2024-07-12 DIAGNOSIS — C7931 Secondary malignant neoplasm of brain: Principal | ICD-10-CM

## 2024-07-12 NOTE — Unmapped (Signed)
 Returned Call to Erin Gordon after receiving call from the communication center in reference to her lab appt. She says that she is wondering why she is having a lab appt with MD visit but NO infusion.       Triage Recommendations:   I concurred that I also saw those appts.  I also read over the appt notes stating that it may be per protocol but was not sure. Will reach out to Damien Needle NN who may be able to assist with this question.     Caller's Response:   She said she is not in a study protocol but is appreciative of the call and is agreeable to this plan and will call back with any further questions/concerns      Outstanding tasks: Care team notified

## 2024-07-12 NOTE — Unmapped (Signed)
 Attempted to return call. Left V/M that I will try again later.

## 2024-07-12 NOTE — Unmapped (Signed)
 Copied from CRM #[id number]. Topic: Care Management - Discuss Care Plan  >> Jul 12, 2024 11:51 AM Jackson POUR wrote:      Erin Gordon contacted the Communication Center requesting to speak with the care team of Erin Gordon to discuss:    Erin Gordon would like a call back.  In regard to infusion cancellation and treatment changes.  She is asking for a call back today.     Please contact Erin Gordon  at [phone].    Thank you,   Jackson DELENA Aid  [facility]   [phone]

## 2024-07-12 NOTE — Unmapped (Unsigned)
 Breast Oncology Return Patient Evaluation  Referring Physician: Pcp, None Per Patient  [address]  [place],  [place] [address].  PCP: [facility]    Cancer Team  Surgical Oncology: Kristalyn Gallagher, DO  Surgical Oncology NP: Izetta New Era, NP and Evalene Pan, NP  Radiation Oncology: Lonell Duncan, MD  Medical Oncology: Reagan Cloud, MD  Plastic Surgery: None at this time  Genetics: None at this time Blood drawn today.    Reason for Visit: A 48 y.o. female with breast cancer referred for consultation for recommendations concerning the management of breast cancer.     Cancer stage   Cancer Staging   Malignant neoplasm of left breast in female, estrogen receptor positive      Staging form: Breast, AJCC 8th Edition  - Clinical stage from 03/11/2022: Stage IIIB (cT2, cN2(f), cM0, G3, ER+, PR-, HER2-) - Signed by Gallagher, Kristalyn Kay, DO on 03/12/2022  - Pathologic stage from 12/11/2022: No Stage Recommended (ypT3, pN2a, G3, ER+, PR-, HER2-) - Signed by [place] Pierce Hummer, ANP on 12/11/2022  - Pathologic stage from 10/28/2023: Stage IV (rpT3, pN2, cM1, GX, ER-, PR-, HER2-) - Signed by Cloud Reagan Bare, MD on 11/11/2023      [name] is a 48 year old lady recently dx with left breast cancer.     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 She was randomized to no upfront RT boost.  She has had multiple delays in her chemotherapy treatment for various reasons. Including infection and cytopenias. She completed last dose on 10/27/2022.     She is s/p left breast lumpectomy and axillary LN dissection of axillary staging on 11/27/2022 with 9 of 28 lymph nodes positive for macro metastatic carcinoma with the size of the largest tumor deposit 33 mm, treatment effect identified in multiple lymph nodes, extensive lymphovascular space invasion, and extranodal extension present less than 2 mm.  Residual left breast invasive ductal carcinoma with tumor size 64 mm in greatest dimension ER+(2%), PR -, HER2- (IHC 1+). She has Residual Cancer Burden Class: RCB-III     Adjuvant systemic therapy:  -Previously discussed option of enrolling in the ASCENT-05/OptimICE-RD (AFT-65): Phase 3, randomized, open-label study of adjuvant sacituzumab govitecan  + pembrolizumab  (pembro) vs pembro ?? capecitabine  in patients with triple-negative breast cancer and residual disease. After meeting with study coordinator she declined.   -Proceeded with SOC using Capecitabine  while continuing Pembrolizumab  every 6 weeks (last cycle on 11/26/2022)  She was completing her last cycle of capecitabine  when she unfortunately presented with seizure like symptoms (Facial twitching). Brain MRI showed 2 enhancing intracranial masses measuring up to 1.8cm in the right frontal lobe and 1.2cm in the posterior right parietal lobe with surrounding vasogenic edema.   -We obtained restaging at the time of discovering CNS progression; with no radiographic evidence of systemic disease.   -She enrolled in the AMZ81-639 Phase I/II Study of Stereotactic Radiosurgery with Concurrent Administration of DNA Damage Response (DDR) Inhibitor (Olaparib ) Followed by Adjuvant Combination of Durvalumab (FZIP5263) and Physician s Choice Systemic Therapy in Subjects with Breast Cancer Brain Metastases. Patient consented and started treatment 11/20/23. Olaparib  chosen for systemic therapy given absence of systemic disease at this time.   She completed Cyber knife to the brain on 12/03/23. She remained on study  for approximately 6 months before noted progression.     CT C/A/P 04/19/24 unfortunately showed POD in the lungs revealing mediastinal and bilateral hilar lymphadenopathy, lateral chest wall soft tissue nodule, and new pulmonary nodules.  There was also indeterminate foci along the hepatic dome. We discussed obtaining biopsy of distant site of disease since she initially presented with no extracranial disease and we have no tissue from metastatic site. I dicussed that we would need to obtain a biopsy, preferably the lung or one of the enlarged mediastinal lymph nodes since we do not have a distant site biopsied since time of metastatic diagnosis. EBUS and mediastinal LN FNA was completed on 05/04/2024 confirming metastatic TNBC. Sample sent for tempus testing    We discussed [facility] 047 as next line of therapy however patient declined participation. She is not eligibile for UARMR941 given brain mets. We proceeded with SOC sacituzumab govitecan  at this time. She is s/p C2 overall she tolerated therapy okay. She does experience significant fatigue a few days following treatment. Labs reviewed and acceptable for therapy today, she would like to continue.     Future clinical trials include : UARMR933 and TRADE-DXD since primary breast biopsy showed HER2 low disease. (IHC 1+)    #Brain Mets  -s/p Cyber knife to the brain on 12/03/23.  Recent MRI brain showed concern for radiation necrosis v possible progression of treated lesions.   -Repeat MRI brain on 06/20/24 showed decreased size of lesions and decreased vasogenic edema. 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 She was recently switched back to Keppra  per Neuro.   -She denies any recent twitching in jaw and eye.  -Continue follow up with neurology.     Genetics:   - Germline testing: STAT Panel plus ATM and CHEK2 sent to [facility] - Negative   -Tempus; PD-L1 expression neg, she has TPN53 mut. No other actionable mutations at this time.     Diarrhea  Diarrhea due to chemotherapy, causing hemorrhoidal bleeding. Managed with Imodium .  - Instruct to take two Imodium  pills at onset of diarrhea and one after each loose stool, not exceeding eight tablets per day. This has been helpful.    Hemorrhoids  Hemorrhoidal bleeding exacerbated by diarrhea. Possible gastroenterology referral if bleeding persists.  - Monitor for recurrent bleeding.  - Consider referral to gastroenterology if bleeding persists.    Hidradenitis suppurativa  Recurrent axillary boils related to hidradenitis suppurativa. Treated with antibiotics. Dermatology referral planned for further management.  - Extend antibiotic course by five days since she will receive immunosuppressive drug.   - Referred to dermatology for further management.    Supportive Care  -Low ACTH, cortisol normal and patient overall asymptomatic - econsult to endocrinology. ACTH <5.0 and Cortisol level 2.6 she is currently on steroid so may have affected results. We have consulted Endocrinology.   She has not had this level repeated, we discussed having done locally to facilitate timing of collection early morning. She is agreeable.   - HTN: patient taking hydrochlorothiazide . BP normalized  -Tobacco use we discussed smoking cessation; she is interested. Referral placed.  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 Will refer to lymphedema clinic in future if needed. Previously sent a referral for breast sleeve with [facility] and see if insurance will cover.  -Coping she endorses difficulty coping  with new metastatic breast cancer diagnosis. We disccussed CCSP, she was agreeable but has declined.      Goals of Care:   Ms. Cerrato wants to receive therapy and live a long life, enjoy her family and feel well.     Dental issues   -Possibly exacerbated by cancer treatment; significant dental issues, including tooth loss and gum irritation/gingivitis.   - Refer to a dentist for evaluation.    RTC:  Proceed with C3 D1,D8  Return in 3 weeks for C4  Restaging scans planned for 08/02/24 ***    ---------------------------------------------------------------------------------------------------------------------------------------------    HPI: Erin Gordon is a 48 y.o. female who returns today for follow up and continuation of treatment for recently dx breast cancer .    Interval hx:   History of Present Illness    Erin Gordon is a 48 year old female with metastatic breast cancer who presents for chemotherapy follow-up.    She is currently undergoing chemotherapy for metastatic breast cancer and had a scheduled week off from treatment last week. She experiences fatigue, particularly on Saturdays, which she attributes to the chemotherapy and possibly the steroids. The fatigue can last through the weekend, and she describes feeling 'more weak than anything'.    She is concerned about her magnesium levels, but her last check on July 16th was normal. She is not on any immunotherapy. She experiences occasional nausea, which she manages with Compazine  as needed.    She is currently on seizure medication, levetiracetam , at a dose of 1500 mg, but she has reduced her intake to 750 mg due to side effects that make her feel like a 'zombie'.  She has not experienced any seizures recently, only minor twitching. Her last MRI showed a decrease in the size of brain lesions and no new lesions.    She is concerned about the logistics of her treatment, including travel to [facility] for infusions to reduce the burden of travel and parking costs. She has previously spoken to a Child psychotherapist about gas and parking passes and is seeking further assistance.      Performance status= ECOG 0    Breast Cancer History  Hematology/Oncology History Overview Note   2023: Left breast cT2 cN2 IDC, G3, weakly+2%/-/-     Malignant neoplasm of left breast in female, estrogen receptor positive       02/12/2022 -  Presenting Symptoms    Self palpated left breast mass    MMG/US : Left breast 6:00 5 CFN there is a 3.2 x 1.8 x 2.4 cm irregular cystic and solid mass. Echogenic foci within the mass likely correspond to pleomorphic calcifications seen on mammography.  Left breast 6:00 2 CFN there is a 1.0 x 0.5 x 0.7 cm hypoechoic circumscribed mass with internal vascularity.  Left axilla there is multiple abnormally enlarged lymph nodes, largest demonstrating a cortical thickness of 1.9 cm.  Right breast 9:00 retroareolar there is  a 0.6 x 0.4 x  0.5 cm irregular hypoechoic mass.     03/03/2022 Biopsy    Left breast core biopsy, 6:00 5 CFN: IDC, G3, ER+(2%), ER-, Her2-(1+).    Left breast core biopsy, 6:00 2 CFN: benign breast tissue with stromal fibrosis and apocrine cysts.    Left axilla core biopsy: IDC, G3, ER+(2%), ER-, Her2-(1+).    Right breast core biopsy, retroareolar: benign breast tissue with stromal fibrosis and apocrine cysts.     03/11/2022 Initial Diagnosis    Malignant neoplasm of left breast in female,  estrogen receptor positive (CMS-HCC)     03/11/2022 -  Cancer Staged    Staging form: Breast, AJCC 8th Edition  - Clinical stage from 03/11/2022: Stage IIIB (cT2, cN2(f), cM0, G3, ER+, PR-, HER2-) - Signed by Cay Shawnee Shaggy, DO on 03/12/2022       03/12/2022 Tumor Board    MDC recs: Left breast cT2 N1 IDC, G3, ER+(2%), PR-, HER2-  Imaging review with 3.2 cm area bx proven IDC and multiple (>7) enlarged axillary LNs. Additional left breast bx benign and concordant. Right breast bx benign and concordant.  NACT given locally advanced, schedule with med/onc add on next week  Staging studies  Possible PRAD trial candidate  Genetics: Meets GT criteria based on dx br cancer <50yo. Stat breast panel + ATM + CHEK2 and econsult     03/25/2022 Interval Scan(s)      MRI bilateral breast completed revealed 3.7 cm transverse by 3.3 cm AP by 2.6 cm craniocaudal. It demonstrates central necrosis with peripheral nodular enhancement. There is also an oval mass measuring 9 mm in the left breast, this mass has not been biopsied. However biopsied was recommended since patient is considering breast conservation surgery post chemotherapy. Patient was scheduled for biopsy of the 9 mm mass, but ultimately declined biopsy due to claustrophobia.     04/23/2022 -  Chemotherapy    STUDY TBCRC-053 IRB# [id number] NEOADJUVANT PEMBROLIZUMAB  +/- RT WITH PACLItaxel / CARBOplatin FOLLOWED BY ddAC (v. 05/16/21)  P-RAD: A Randomized Study of Preoperative Chemotherapy, Pembrolizumab  and No, Low or High Dose RADiation in Node-Positive, HER2-Negative Breast Cancer     12/11/2022 -  Cancer Staged    Staging form: Breast, AJCC 8th Edition  - Pathologic stage from 12/11/2022: No Stage Recommended (ypT3, pN2a, G3, ER+, PR-, HER2-) - Signed by Linnie Pierce Hummer, ANP on 12/11/2022       10/28/2023 -  Cancer Staged    Staging form: Breast, AJCC 8th Edition  - Pathologic stage from 10/28/2023: Stage IV (rpT3, pN2, cM1, GX, ER-, PR-, HER2-) - Signed by Zettie Reagan Bare, MD on 11/11/2023       Malignant neoplasm of left breast in female, estrogen receptor positive, unspecified site of breast       03/25/2022 Interval Scan(s)      MRI bilateral breast completed revealed 3.7 cm transverse by 3.3 cm AP by 2.6 cm craniocaudal. It demonstrates central necrosis with peripheral nodular enhancement. There is also an oval mass measuring 9 mm in the left breast, this mass has not been biopsied. However biopsied was recommended since patient is considering breast conservation surgery post chemotherapy. Patient was scheduled for biopsy of the 9 mm mass, but ultimately declined biopsy due to claustrophobia.     03/26/2022 Initial Diagnosis    Malignant neoplasm of left breast in female, estrogen receptor positive, unspecified site of breast (CMS-HCC)     03/26/2022 - 06/08/2022 Radiation    Radiation Therapy Treatment Details (03/26/2022 - 06/08/2022)  Site: Right Breast  Technique: IMRT  Goal: Curative  Planned Treatment Start Date: No planned start date specified     01/01/2023 -  Radiation    Radiation Therapy Treatment Details (Noted on 01/01/2023)  Site: Left Breast with lymph nodes  Technique: VMAT  Goal: No goal specified  Planned Treatment Start Date: No planned start date specified     Metastasis to brain       11/11/2023 Initial Diagnosis    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 Goes to [facility].    GERD - taking PRN PPI  Anxiety - not treated        Reproductive/GYN History  OB History   Gravida Para Term Preterm AB Living   4 4 4  0 0 0   SAB IAB Ectopic Molar Multiple Live Births   0 0 0 0 0 0      # Outcome Date GA Lbr Len/2nd Weight Sex Type Anes PTL Lv   4 Term            3 Term            2 Term            1 Term                Surgical History  Past Surgical History:   Procedure Laterality Date    BREAST BIOPSY      BREAST LUMPECTOMY Left 11/27/2022    CHEMOTHERAPY      HYSTERECTOMY      IR INSERT PORT AGE GREATER THAN 5 YRS  04/23/2022    IR INSERT PORT AGE GREATER THAN 5 YRS 04/23/2022 Courtney Harris Evron, PA IMG VIR HBR    PR BRNCHSC EBUS GUIDED SAMPL 1/2 NODE STATION/STRUX N/A 05/04/2024    Procedure: BRONCH, RIGID OR FLEXIBLE, INC FLUORO GUIDANCE, WHEN PERFORMED; WITH EBUS GUIDED TRANSTRACHEAL AND/OR TRANSBRONCHIAL SAMPLING, ONE OR TWO MEDIASTINAL AND/OR HILAR LYMPH NODE STATIONS OR STRUCTURES;  Surgeon: Fontaine Selinda Beam, MD;  Location: OR [facility];  Service: Pulmonary    PR INTRAOPERATIVE SENTINEL LYMPH NODE ID W DYE INJECTION Left 11/27/2022    Procedure: INTRAOPERATIVE IDENTIFICATION SENTINEL LYMPH NODE(S) INCLUDE INJECTION NON-RADIOACTIVE DYE, WHEN PERFORMED; Surgeon: Iva Cay Murray, DO;  Location: [facility];  Service: Surgical Oncology Breast    PR MASTECTOMY, PARTIAL Left 11/27/2022    Procedure: MASTECTOMY, PARTIAL (EG, LUMPECTOMY, TYLECTOMY, QUADRANTECTOMY, SEGMENTECTOMY);  Surgeon: Gallagher, Kristalyn Kay, DO;  Location: OR [facility];  Service: Surgical Oncology Breast    PR REMOVE ARMPITS LYMPH NODES COMPLT Left 11/27/2022    Procedure: AXILLARY LYMPHADENECTOMY; COMPLETE;  Surgeon: Iva Cay Murray, DO;  Location: [facility];  Service: Surgical Oncology Breast    RADIATION         Medications    Current Outpatient Medications:     albuterol  HFA 90 mcg/actuation inhaler, Inhale 2 puffs every six (6) hours as needed for wheezing., Disp: 8 g, Rfl: 2    aspirin 325 MG tablet, Take 1 tablet (325 mg total) by mouth daily. (Patient not taking: Reported on 06/22/2024), Disp: , Rfl:     dexAMETHasone  (DECADRON ) 4 MG tablet, Take 2 tablets (8 mg total) by mouth daily. Take every morning on Days 2, 3, and 4 and 9, 10 and 11., Disp: 60 tablet, Rfl: 1    hydrocortisone  (ANUSOL -HC) 2.5 % rectal cream, Insert into the rectum two (2) times a day., Disp: 30 g, Rfl: 5    levETIRAcetam  (KEPPRA ) 500 MG tablet, Take 1  tablet (500 mg total) by mouth two (2) times a day., Disp: 180 tablet, Rfl: 3    loperamide  (IMODIUM ) 2 mg capsule, Take 2 capsules to start, then 1 capsule every 2 hours until diarrhea free for 12 hours., Disp: 60 capsule, Rfl: prn    omeprazole  (PRILOSEC) 40 MG capsule, Take 1 capsule (40 mg total) by mouth daily., Disp: 30 capsule, Rfl: 3    ondansetron  (ZOFRAN ) 8 MG tablet, Take 1 tablet (8 mg total) by mouth every eight (8) hours as needed for nausea., Disp: 30 tablet, Rfl: 2    ondansetron  (ZOFRAN ) 8 MG tablet, Take 1 tablet (8 mg total) by mouth two (2) times a day. Take on Days 2, 3, and 4 and 9, 10 and 11; And then, 8 mg every 8 hours as needed., Disp: 30 tablet, Rfl: 5    pegfilgrastim -apgf (NYVEPRIA ) 6 mg/0.6 mL injection, Inject 0.6 mL (6 mg total) under the skin every twenty-one (21) days. Administer on day 9 of every 21 day cycle. (Chemo is D1, 8 every 21 days), Disp: 0.6 mL, Rfl: 5    prochlorperazine  (COMPAZINE ) 10 MG tablet, Take 1 tablet (10 mg total) by mouth every six (6) hours as needed (nausea)., Disp: 30 tablet, Rfl: 5    Allergies  No Known Allergies    Personal and Social History  Social History     Social History Narrative    The patient is committed relationship.  She works as Scientist, research (medical). She has four children.   Lives in [place], [place] with her long-term boyfriend, Burnetta, and two of her children. She has 4 children and now 5 grand children aged 27mo-9yrs. 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 Loves spending time at [employer]. Has been to the beach around 5 times the past year including [address], [address]     Smoking 1ppd x 10-15 years. Has quit before.  MJ smoking 1-4 joints per day  No cocaine, heroin, no IVDU  EtOH socially 2-3x weekly      Family History  Family History   Problem Relation Age of Onset    Diabetes Mother     Hypertension Mother     Parkinsonism Father     No Known Problems Sister     No Known Problems Daughter     No Known Problems Maternal Grandmother     No Known Problems Maternal Grandfather     Ovarian cancer Paternal Grandmother 76    Cancer Paternal Uncle         colon or prostate ?age    Breast cancer Paternal Cousin 45        recent dx, limited cntact    Breast cancer Other         3x paternal great aunts (PGF sisters) with breast ca ?ages; there may be other more distant paternal cousins w/ bresat ca    BRCA 1/2 Neg Hx     Colon cancer Neg Hx     Endometrial cancer Neg Hx    Parents still alive. Mother with DM. Father with parkinsons.   Uncle (paternal) cancer  Uncle (paternal) prostate  MGF colon cancer  Paternal aunt with breast cancer      Review of Systems: A 12-system review of systems was obtained including: Constitutional, Eyes, ENT, Cardiovascular, Respiratory, GI, GU, Musculoskeletal, Skin, Neurological, Psychiatric, Endocrine, Heme/Lymphatic, and Allergic/Immunologic systems. It is negative or non-contributory to the patient???s management except for the following: See Interval History.  Physical Examination:  Vital Signs: There were no vitals taken for this visit.  General:  Healthy-appearing female in no acute distress.  Cardiovasc:  No heaves, regular, no additional sounds. No lower extremity edema.    Respiratory:  Chest clear to percussion and auscultation, unlabored. Implanted port located in upper right chest, site clean, dry no swelling or erythema.  Gastrointestinal:  Soft, nontender, no hepatomegaly.   Musculoskeletal:  No bony pain or tenderness.   Skin and Subcutaneous Tissues:  Negative. No rash, ecchymoses, purpuric lesions.  Psychiatric: Mood is normal. Appropriately anxious.  No other symptoms.   Neuro:  Alert and oriented.  Gait and coordination normal  Upper Extremity Lymphedema: None.   Breasts: (deferred) Left breast with radiation changes, well healed lumpectomy scar. Palpable 2cm nodule in the lateral left breast slightly smaller in size. Right breast no palpable mass or nodules.   Right axilla small palpable nodule consistent with treated abscess, no inflammation or erythema.     I have personally reviewed the following diagnostic studies:  Lab Results   Component Value Date    WBC 4.8 07/06/2024    HGB 9.9 (L) 07/06/2024    HCT 30.8 (L) 07/06/2024    PLT 243 07/06/2024    CHOL 181 06/03/2021    TRIG 82 06/03/2021    HDL 43 06/03/2021    ALT 27 07/06/2024    AST 12 07/06/2024    NA 141 07/06/2024    K 3.7 07/06/2024    CL 104 07/06/2024    CREATININE 0.58 07/06/2024    BUN 11 07/06/2024    CO2 24.0 07/06/2024    TSH 1.487 04/20/2024    INR 0.85 11/20/2023

## 2024-07-12 NOTE — Unmapped (Signed)
 Copied from CRM #[id number]. Topic: Care Management - Discuss Care Plan  >> Jul 12, 2024 11:39 AM Erin Gordon wrote:    Erin Gordon contacted the Communication Center requesting to speak with the care team of Erin Gordon to discuss:    Requested to speak with a member of the clinical care team to discuss their treatment plan.    Please contact Erin Gordon at [phone].    Thank you,   Erin Gordon  [facility]   [phone]

## 2024-07-14 ENCOUNTER — Ambulatory Visit: Admit: 2024-07-14 | Payer: Medicare (Managed Care)

## 2024-07-19 NOTE — Unmapped (Signed)
 Breast Oncology Return Patient Evaluation  Referring Physician: Pcp, None Per Patient  [address]  [place],  [place] [address].  PCP: [facility]    Cancer Team  Surgical Oncology: Kristalyn Gallagher, DO  Surgical Oncology NP: Izetta McRoberts, NP and Evalene Pan, NP  Radiation Oncology: Lonell Duncan, MD  Medical Oncology: Reagan Cloud, MD  Plastic Surgery: None at this time  Genetics: None at this time Blood drawn today.    Reason for Visit: A 48 y.o. female with breast cancer referred for consultation for recommendations concerning the management of breast cancer.     Cancer stage   Cancer Staging   Malignant neoplasm of left breast in female, estrogen receptor positive      Staging form: Breast, AJCC 8th Edition  - Clinical stage from 03/11/2022: Stage IIIB (cT2, cN2(f), cM0, G3, ER+, PR-, HER2-) - Signed by Gallagher, Kristalyn Kay, DO on 03/12/2022  - Pathologic stage from 12/11/2022: No Stage Recommended (ypT3, pN2a, G3, ER+, PR-, HER2-) - Signed by  Pierce Hummer, ANP on 12/11/2022  - Pathologic stage from 10/28/2023: Stage IV (rpT3, pN2, cM1, GX, ER-, PR-, HER2-) - Signed by Cloud Reagan Bare, MD on 11/11/2023      [name] is a 48 year old lady recently dx with left breast cancer.     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 She was randomized to no upfront RT boost.  She has had multiple delays in her chemotherapy treatment for various reasons. Including infection and cytopenias. She completed last dose on 10/27/2022.     She is s/p left breast lumpectomy and axillary LN dissection of axillary staging on 11/27/2022 with 9 of 28 lymph nodes positive for macro metastatic carcinoma with the size of the largest tumor deposit 33 mm, treatment effect identified in multiple lymph nodes, extensive lymphovascular space invasion, and extranodal extension present less than 2 mm.  Residual left breast invasive ductal carcinoma with tumor size 64 mm in greatest dimension ER+(2%), PR -, HER2- (IHC 1+). She has Residual Cancer Burden Class: RCB-III     Adjuvant systemic therapy:  -Previously discussed option of enrolling in the ASCENT-05/OptimICE-RD (AFT-65): Phase 3, randomized, open-label study of adjuvant sacituzumab govitecan  + pembrolizumab  (pembro) vs pembro ?? capecitabine  in patients with triple-negative breast cancer and residual disease. After meeting with study coordinator she declined.   -Proceeded with SOC using Capecitabine  while continuing Pembrolizumab  every 6 weeks (last cycle on 11/26/2022)    Recurrent TNBC Metastatic breast cancer   1st line-BRE18-360 Phase I/II Study included Olapraib and Durvalumab  2nd line-Sacituzumab     She was completing her last cycle of capecitabine  when she unfortunately presented with seizure like symptoms (Facial twitching). Brain MRI showed 2 enhancing intracranial masses measuring up to 1.8cm in the right frontal lobe and 1.2cm in the posterior right parietal lobe with surrounding vasogenic edema. We obtained restaging at the time of discovering CNS progression; with no radiographic evidence of systemic disease. She enrolled in the BRE18-360 Phase I/II Study of Stereotactic Radiosurgery with Concurrent Administration of DNA Damage Response (DDR) Inhibitor (Olaparib ) Followed by Adjuvant Combination of Durvalumab ([id number]) and Physician s Choice Systemic Therapy in Subjects with Breast Cancer Brain Metastases. Patient consented and started treatment 11/20/23. Olaparib  chosen for systemic therapy given absence  of systemic disease at the time.   She completed Cyber knife to the brain on 12/03/23. She remained on study for approximately 6 months before noted progression.   -May 2025 POD new pulmonary nodules with mediastinal and bilateral hilar lymphadenopathy.There was also indeterminate foci along the hepatic dome. We discussed obtaining biopsy of distant site of disease since she initially presented with no extracranial disease. EBUS and mediastinal LN FNA was completed on 05/04/2024 confirming metastatic TNBC. Sample sent for tempus testing.We proceeded with [facility] sacituzumab govitecan  at this time    She is s/p C4 she is tolerating okay; although she reports significant fatigue a few days following treatment each week. She is concern about the dosing and inquires if a dose reduction is possible. She describes grade 2/3 fatigue interfering with daily activities. We discussed DR in effort to improve tolerance. Of note She did have tx hold last cycle due to neutropenia, we have add GSCF support Day 10 patient self injects.      Proceed with Sacituzumab 7.5 mg/kg Days 1,8 every 21 days ( DR starting C4)   Pegfilgrastim  6 mg on day 10    Future clinical trials include : UARMR933 and TRADE-DXD since primary breast biopsy showed HER2 low disease. (IHC 1+)  She is not eligibile for UARMR941 given brain mets.    #Brain Mets  -s/p Cyber knife to the brain on 12/03/23.  Recent MRI brain showed concern for radiation necrosis v possible progression of treated lesions.   -Repeat MRI brain on 06/20/24 showed decreased size of lesions and decreased vasogenic edema.   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 She was recently switched back to Keppra  per Neuro.   -She denies any recent twitching in jaw and eye.  -Continue follow up with neurology.     Genetics:   - Germline testing: STAT Panel plus ATM and CHEK2 sent to [facility] - Negative   -Tempus; PD-L1 expression neg, she has TPN53 mut. No other actionable mutations at this time.     Diarrhea  Diarrhea due to chemotherapy, causing hemorrhoidal bleeding. Managed with Imodium .  - Instruct to take two Imodium  pills at onset of diarrhea and one after each loose stool, not exceeding eight tablets per day. This has been helpful.    Hemorrhoids  Hemorrhoidal bleeding exacerbated by diarrhea. Possible gastroenterology referral if bleeding persists.  - Monitor for recurrent bleeding.  - Consider referral to gastroenterology if bleeding persists.    Hidradenitis suppurativa  Recurrent axillary boils related to hidradenitis suppurativa. Treated with antibiotics. Dermatology referral planned for further management.  - Extend antibiotic course by five days since she will receive immunosuppressive drug.   - Referred to dermatology for further management.    Supportive Care  -Low ACTH, cortisol normal and patient overall asymptomatic - econsult to endocrinology. ACTH <5.0 and Cortisol level 2.6 she is currently on steroid so may have affected results. We have consulted Endocrinology.   She has not had this level repeated, we discussed having done locally to facilitate timing of collection early morning. She is agreeable.   - HTN: patient taking hydrochlorothiazide . BP normalized  -Tobacco use we discussed smoking cessation; she is interested. Referral placed.  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 Will refer to lymphedema clinic in future if needed. Previously sent a referral for breast sleeve  with [facility] and see if insurance will cover.  -Coping she endorses difficulty coping with new metastatic breast cancer diagnosis. We disccussed CCSP, she was agreeable but has declined.    -Dry eyes with intermittent cloudiness; recommend  Refresh eye drops regularly. Will also Refer to ophthalmologist for comprehensive eye exam.   -Edema lower extremities and wt gain. Steroids may contribute to increased appetite and wt gain. We will reduce Day 2,3 dexamethasone  to 4 mg daily. Encourage sodium intake reduction to manage edema, dietary intake includes lots of processed foods and take out. Goals of Care:   Ms. Westergard wants to receive therapy and live a long life, enjoy her family and feel well.     Dental issues   -Possibly exacerbated by cancer treatment; significant dental issues, including tooth loss and gum irritation/gingivitis.   - Refer to a dentist for evaluation.    RTC:  Restaging scans planned for 08/02/24   Follow up with me to discuss results 9/12.     ---------------------------------------------------------------------------------------------------------------------------------------------    HPI: Erin Gordon is a 48 y.o. female who returns today for follow up and continuation of treatment for recently dx breast cancer .    Interval hx:   History of Present Illness    Erin Gordon is a 48 year old female with metastatic cancer who presents for follow up and ongoing management of disease.  She is accompanied by her fiance.     She experiences significant swelling in her feet, legs, face, and neck, occurring intermittently every other day. The swelling is accompanied by a sensation of being 'weighted down' and fatigue, particularly during the second week after chemotherapy. The swelling sometimes subsides but then returns.    She reports changes in her vision, including dry eyes and occasional cloudiness that clears up later. No double vision, loss of vision, or seeing spots, stars, or flashing lights.     She is currently on chemotherapy, which causes significant fatigue and swelling. She takes dexamethasone , 8 mg daily, which she believes contributes to her appetite and weight gain.  No nausea, vomiting, constipation, diarrhea, or bleeding. She reports feeling as if she has 'fifteen blankets' on her body due to fatigue, especially during the rest week after chemotherapy.        Performance status= ECOG 0    Breast Cancer History  Hematology/Oncology History Overview Note   2023: Left breast cT2 cN2 IDC, G3, weakly+2%/-/-     Malignant neoplasm of left breast in female, estrogen receptor positive       02/12/2022 -  Presenting Symptoms    Self palpated left breast mass    MMG/US : Left breast 6:00 5 CFN there is a 3.2 x 1.8 x 2.4 cm irregular cystic and solid mass. Echogenic foci within the mass likely correspond to pleomorphic calcifications seen on mammography.  Left breast 6:00 2 CFN there is a 1.0 x 0.5 x 0.7 cm hypoechoic circumscribed mass with internal vascularity.  Left axilla there is multiple abnormally enlarged lymph nodes, largest demonstrating a cortical thickness of 1.9 cm.  Right breast 9:00 retroareolar there is  a 0.6 x 0.4 x  0.5 cm irregular hypoechoic mass.     03/03/2022 Biopsy    Left breast core biopsy, 6:00 5 CFN: IDC, G3, ER+(2%), ER-, Her2-(1+).    Left breast core biopsy, 6:00 2 CFN: benign breast tissue with stromal fibrosis and apocrine cysts.    Left axilla core biopsy: IDC, G3, ER+(2%), ER-, Her2-(1+).  Right breast core biopsy, retroareolar: benign breast tissue with stromal fibrosis and apocrine cysts.     03/11/2022 Initial Diagnosis    Malignant neoplasm of left breast in female, estrogen receptor positive (CMS-HCC)     03/11/2022 -  Cancer Staged    Staging form: Breast, AJCC 8th Edition  - Clinical stage from 03/11/2022: Stage IIIB (cT2, cN2(f), cM0, G3, ER+, PR-, HER2-) - Signed by Cay Shawnee Shaggy, DO on 03/12/2022       03/12/2022 Tumor Board    MDC recs: Left breast cT2 N1 IDC, G3, ER+(2%), PR-, HER2-  Imaging review with 3.2 cm area bx proven IDC and multiple (>7) enlarged axillary LNs. Additional left breast bx benign and concordant. Right breast bx benign and concordant.  NACT given locally advanced, schedule with med/onc add on next week  Staging studies  Possible PRAD trial candidate  Genetics: Meets GT criteria based on dx br cancer <50yo. Stat breast panel + ATM + CHEK2 and econsult     03/25/2022 Interval Scan(s)      MRI bilateral breast completed revealed 3.7 cm transverse by 3.3 cm AP by 2.6 cm craniocaudal. It demonstrates central necrosis with peripheral nodular enhancement. There is also an oval mass measuring 9 mm in the left breast, this mass has not been biopsied. However biopsied was recommended since patient is considering breast conservation surgery post chemotherapy. Patient was scheduled for biopsy of the 9 mm mass, but ultimately declined biopsy due to claustrophobia.     04/23/2022 -  Chemotherapy    STUDY TBCRC-053 IRB# [id number] NEOADJUVANT PEMBROLIZUMAB  +/- RT WITH PACLItaxel / CARBOplatin FOLLOWED BY ddAC (v. 05/16/21)  P-RAD: A Randomized Study of Preoperative Chemotherapy, Pembrolizumab  and No, Low or High Dose RADiation in Node-Positive, HER2-Negative Breast Cancer     12/11/2022 -  Cancer Staged    Staging form: Breast, AJCC 8th Edition  - Pathologic stage from 12/11/2022: No Stage Recommended (ypT3, pN2a, G3, ER+, PR-, HER2-) - Signed by Linnie Pierce Hummer, ANP on 12/11/2022       10/28/2023 -  Cancer Staged    Staging form: Breast, AJCC 8th Edition  - Pathologic stage from 10/28/2023: Stage IV (rpT3, pN2, cM1, GX, ER-, PR-, HER2-) - Signed by Zettie Reagan Bare, MD on 11/11/2023       Malignant neoplasm of left breast in female, estrogen receptor positive, unspecified site of breast       03/25/2022 Interval Scan(s)      MRI bilateral breast completed revealed 3.7 cm transverse by 3.3 cm AP by 2.6 cm craniocaudal. It demonstrates central necrosis with peripheral nodular enhancement. There is also an oval mass measuring 9 mm in the left breast, this mass has not been biopsied. However biopsied was recommended since patient is considering breast conservation surgery post chemotherapy. Patient was scheduled for biopsy of the 9 mm mass, but ultimately declined biopsy due to claustrophobia.     03/26/2022 Initial Diagnosis    Malignant neoplasm of left breast in female, estrogen receptor positive, unspecified site of breast (CMS-HCC)     03/26/2022 - 06/08/2022 Radiation    Radiation Therapy Treatment Details (03/26/2022 - 06/08/2022)  Site: Right Breast  Technique: IMRT  Goal: Curative  Planned Treatment Start Date: No planned start date specified     01/01/2023 -  Radiation    Radiation Therapy Treatment Details (Noted on 01/01/2023)  Site: Left Breast with lymph nodes  Technique: VMAT  Goal: No goal specified  Planned Treatment Start Date: No planned start  date specified     Metastasis to brain       11/11/2023 Initial Diagnosis    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 [facility]es to [facility].    GERD - taking PRN PPI  Anxiety - not treated        Reproductive/GYN History  OB History   Gravida Para Term Preterm AB Living   4 4 4  0 0 0   SAB IAB Ectopic Molar Multiple Live Births   0 0 0 0 0 0      # Outcome Date GA Lbr Len/2nd Weight Sex Type Anes PTL Lv   4 Term            3 Term            2 Term            1 Term                Surgical History  Past Surgical History:   Procedure Laterality Date    BREAST BIOPSY      BREAST LUMPECTOMY Left 11/27/2022    CHEMOTHERAPY      HYSTERECTOMY      IR INSERT PORT AGE GREATER THAN 5 YRS  04/23/2022    IR INSERT PORT AGE GREATER THAN 5 YRS 04/23/2022 Courtney Harris Evron, PA IMG VIR [facility]BR    PR BRNCHSC EBUS GUIDED SAMPL 1/2 NODE STATION/STRUX N/A 05/04/2024    Procedure: BRONCH, RIGID OR FLEXIBLE, INC FLUORO GUIDANCE, WHEN PERFORMED; WITH EBUS GUIDED TRANSTRACHEAL AND/OR TRANSBRONCHIAL SAMPLING, ONE OR TWO MEDIASTINAL AND/OR HILAR LYMPH NODE STATIONS OR STRUCTURES;  Surgeon: Fontaine Selinda Beam, MD;  Location: OR [facility];  Service: Pulmonary    PR INTRAOPERATIVE SENTINEL LYMPH NODE ID W DYE INJECTION Left 11/27/2022    Procedure: INTRAOPERATIVE IDENTIFICATION SENTINEL LYMPH NODE(S) INCLUDE INJECTION NON-RADIOACTIVE DYE, WHEN PERFORMED;  Surgeon: Iva Cay Murray, DO;  Location: [facility];  Service: Surgical Oncology Breast    PR MASTECTOMY, PARTIAL Left 11/27/2022    Procedure: MASTECTOMY, PARTIAL (EG, LUMPECTOMY, TYLECTOMY, QUADRANTECTOMY, SEGMENTECTOMY);  Surgeon: Gallagher, Kristalyn Kay, DO;  Location: OR [facility];  Service: Surgical Oncology Breast    PR REMOVE ARMPITS LYMPH NODES COMPLT Left 11/27/2022    Procedure: AXILLARY LYMPHADENECTOMY; COMPLETE;  Surgeon: Iva Cay Murray, DO;  Location: OR [facility];  Service: Surgical Oncology Breast    RADIATION         Medications    Current Outpatient Medications:     albuterol  HFA 90 mcg/actuation inhaler, Inhale 2 puffs every six (6) hours as needed for wheezing., Disp: 8 g, Rfl: 2    aspirin 325 MG tablet, Take 1 tablet (325 mg total) by mouth daily. (Patient not taking: Reported on 06/22/2024), Disp: , Rfl:     dexAMETHasone  (DECADRON ) 4 MG tablet, Take 2 tablets (8 mg total) by mouth daily. Take every morning on Days 2, 3, and 4 and 9, 10 and 11., Disp: 60 tablet, Rfl: 1  hydrocortisone  (ANUSOL -HC) 2.5 % rectal cream, Insert into the rectum two (2) times a day., Disp: 30 g, Rfl: 5    levETIRAcetam  (KEPPRA ) 500 MG tablet, Take 1 tablet (500 mg total) by mouth two (2) times a day., Disp: 180 tablet, Rfl: 3    loperamide  (IMODIUM ) 2 mg capsule, Take 2 capsules to start, then 1 capsule every 2 hours until diarrhea free for 12 hours., Disp: 60 capsule, Rfl: prn    omeprazole  (PRILOSEC) 40 MG capsule, Take 1 capsule (40 mg total) by mouth daily., Disp: 30 capsule, Rfl: 3    ondansetron  (ZOFRAN ) 8 MG tablet, Take 1 tablet (8 mg total) by mouth every eight (8) hours as needed for nausea., Disp: 30 tablet, Rfl: 2    ondansetron  (ZOFRAN ) 8 MG tablet, Take 1 tablet (8 mg total) by mouth two (2) times a day. Take on Days 2, 3, and 4 and 9, 10 and 11; And then, 8 mg every 8 hours as needed., Disp: 30 tablet, Rfl: 5    pegfilgrastim -apgf (NYVEPRIA ) 6 mg/0.6 mL injection, Inject 0.6 mL (6 mg total) under the skin every twenty-one (21) days. Administer on day 9 of every 21 day cycle. (Chemo is D1, 8 every 21 days), Disp: 0.6 mL, Rfl: 5    prochlorperazine  (COMPAZINE ) 10 MG tablet, Take 1 tablet (10 mg total) by mouth every six (6) hours as needed (nausea)., Disp: 30 tablet, Rfl: 5    Allergies  No Known Allergies    Personal and Social History  Social History     Social History Narrative    The patient is committed relationship.  She works as Scientist, research (medical). She has four children.   Lives in [place], [place] with her long-term boyfriend, Burnetta, and two of her children. She has 4 children and now 5 grand children aged 40mo-62yrs. 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 Loves spending time at [employer]. Has been to the beach around 5 times the past year including [address], [address]     Smoking 1ppd x 10-15 years. Has quit before.  MJ smoking 1-4 joints per day  No cocaine, heroin, no IVDU  EtOH socially 2-3x weekly      Family History  Family History   Problem Relation Age of Onset    Diabetes Mother     Hypertension Mother     Parkinsonism Father     No Known Problems Sister     No Known Problems Daughter     No Known Problems Maternal Grandmother     No Known Problems Maternal Grandfather     Ovarian cancer Paternal Grandmother 68    Cancer Paternal Uncle         colon or prostate ?age    Breast cancer Paternal Cousin 32        recent dx, limited cntact    Breast cancer Other         3x paternal great aunts (PGF sisters) with breast ca ?ages; there may be other more distant paternal cousins w/ bresat ca    BRCA 1/2 Neg Hx     Colon cancer Neg Hx     Endometrial cancer Neg Hx    Parents still alive. Mother with DM. Father with parkinsons.   Uncle (paternal) cancer  Uncle (paternal) prostate  MGF colon cancer  Paternal aunt with breast cancer      Review of Systems: A 12-system review of systems was obtained including: Constitutional, Eyes, ENT,  Cardiovascular, Respiratory, GI, GU, Musculoskeletal, Skin, Neurological, Psychiatric, Endocrine, Heme/Lymphatic, and Allergic/Immunologic systems. It is negative or non-contributory to the patient???s management except for the following: See Interval History.    Physical Examination:  Vital Signs: BP 148/89  - Pulse 85  - Temp 36.6 ??C (97.9 ??F) (Temporal)  - Resp 18  - Wt (!) 103.2 kg (227 lb 8 oz)  - SpO2 100%  - BMI 39.05 kg/m??   General:  Healthy-appearing female in no acute distress.  Cardiovasc:  No heaves, regular, no additional sounds. No lower extremity edema.    Respiratory:  Chest clear to percussion and auscultation, unlabored. Implanted port located in upper right chest, site clean, dry no swelling or erythema.  Gastrointestinal:  Soft, nontender, no hepatomegaly.   Musculoskeletal:  No bony pain or tenderness.   Skin and Subcutaneous Tissues:  Negative. No rash, ecchymoses, purpuric lesions.  Psychiatric: Mood is normal. Appropriately anxious.  No other symptoms.   Neuro:  Alert and oriented.  Gait and coordination normal  Upper Extremity Lymphedema: None.   Breasts:  Left breast with radiation changes, well healed lumpectomy scar. Palpable 2cm nodule in the lateral left breast cannot appreciate on exam today.  Right breast no palpable mass or nodules.   Right axilla small palpable nodule consistent with treated abscess, no inflammation or erythema.     I have personally reviewed the following diagnostic studies:  Lab Results   Component Value Date    WBC 4.8 07/06/2024    HGB 9.9 (L) 07/06/2024    HCT 30.8 (L) 07/06/2024    PLT 243 07/06/2024    CHOL 181 06/03/2021    TRIG 82 06/03/2021    HDL 43 06/03/2021    ALT 27 07/06/2024    AST 12 07/06/2024    NA 141 07/06/2024    K 3.7 07/06/2024    CL 104 07/06/2024    CREATININE 0.58 07/06/2024    BUN 11 07/06/2024 CO2 24.0 07/06/2024    TSH 1.487 04/20/2024    INR 0.85 11/20/2023

## 2024-07-20 ENCOUNTER — Other Ambulatory Visit: Admit: 2024-07-20 | Discharge: 2024-07-21 | Payer: Medicare (Managed Care)

## 2024-07-20 ENCOUNTER — Ambulatory Visit: Admit: 2024-07-20 | Discharge: 2024-07-21 | Payer: Medicare (Managed Care)

## 2024-07-20 ENCOUNTER — Ambulatory Visit: Admit: 2024-07-20 | Payer: Medicare (Managed Care)

## 2024-07-20 ENCOUNTER — Inpatient Hospital Stay: Admit: 2024-07-20 | Discharge: 2024-07-21 | Payer: Medicare (Managed Care)

## 2024-07-20 DIAGNOSIS — K649 Unspecified hemorrhoids: Principal | ICD-10-CM

## 2024-07-20 DIAGNOSIS — I10 Essential (primary) hypertension: Principal | ICD-10-CM

## 2024-07-20 DIAGNOSIS — T451X5D Adverse effect of antineoplastic and immunosuppressive drugs, subsequent encounter: Principal | ICD-10-CM

## 2024-07-20 DIAGNOSIS — C50912 Malignant neoplasm of unspecified site of left female breast: Principal | ICD-10-CM

## 2024-07-20 DIAGNOSIS — Z7982 Long term (current) use of aspirin: Principal | ICD-10-CM

## 2024-07-20 DIAGNOSIS — Z79899 Other long term (current) drug therapy: Principal | ICD-10-CM

## 2024-07-20 DIAGNOSIS — C771 Secondary and unspecified malignant neoplasm of intrathoracic lymph nodes: Principal | ICD-10-CM

## 2024-07-20 DIAGNOSIS — Z170 Estrogen receptor positive status [ER+]: Principal | ICD-10-CM

## 2024-07-20 DIAGNOSIS — F17210 Nicotine dependence, cigarettes, uncomplicated: Principal | ICD-10-CM

## 2024-07-20 DIAGNOSIS — Z5112 Encounter for antineoplastic immunotherapy: Principal | ICD-10-CM

## 2024-07-20 DIAGNOSIS — I890 Lymphedema, not elsewhere classified: Principal | ICD-10-CM

## 2024-07-20 DIAGNOSIS — G620 Drug-induced polyneuropathy: Principal | ICD-10-CM

## 2024-07-20 DIAGNOSIS — C773 Secondary and unspecified malignant neoplasm of axilla and upper limb lymph nodes: Principal | ICD-10-CM

## 2024-07-20 DIAGNOSIS — Z923 Personal history of irradiation: Principal | ICD-10-CM

## 2024-07-20 DIAGNOSIS — C7931 Secondary malignant neoplasm of brain: Principal | ICD-10-CM

## 2024-07-20 DIAGNOSIS — Z006 Encounter for examination for normal comparison and control in clinical research program: Principal | ICD-10-CM

## 2024-07-20 DIAGNOSIS — R197 Diarrhea, unspecified: Principal | ICD-10-CM

## 2024-07-20 DIAGNOSIS — K219 Gastro-esophageal reflux disease without esophagitis: Principal | ICD-10-CM

## 2024-07-20 LAB — CBC W/ AUTO DIFF
HEMATOCRIT: 30 % — ABNORMAL LOW (ref 34.0–44.0)
HEMOGLOBIN: 9.7 g/dL — ABNORMAL LOW (ref 11.3–14.9)
MEAN CORPUSCULAR HEMOGLOBIN CONC: 32.2 g/dL (ref 32.0–36.0)
MEAN CORPUSCULAR HEMOGLOBIN: 24.3 pg — ABNORMAL LOW (ref 25.9–32.4)
MEAN CORPUSCULAR VOLUME: 75.3 fL — ABNORMAL LOW (ref 77.6–95.7)
MEAN PLATELET VOLUME: 8.7 fL (ref 6.8–10.7)
NUCLEATED RED BLOOD CELLS: 3 /100{WBCs}
PLATELET COUNT: 173 10*9/L (ref 150–450)
RED BLOOD CELL COUNT: 3.98 10*12/L (ref 3.95–5.13)
RED CELL DISTRIBUTION WIDTH: 23.9 % — ABNORMAL HIGH (ref 12.2–15.2)
WBC ADJUSTED: 15.4 10*9/L — ABNORMAL HIGH (ref 3.6–11.2)

## 2024-07-20 LAB — COMPREHENSIVE METABOLIC PANEL
ALBUMIN: 3.2 g/dL — ABNORMAL LOW (ref 3.4–5.0)
ALKALINE PHOSPHATASE: 129 U/L — ABNORMAL HIGH (ref 46–116)
ALT (SGPT): 35 U/L (ref 10–49)
ANION GAP: 10 mmol/L (ref 5–14)
AST (SGOT): 12 U/L (ref <=34)
BILIRUBIN TOTAL: 0.3 mg/dL (ref 0.3–1.2)
BLOOD UREA NITROGEN: 15 mg/dL (ref 9–23)
BUN / CREAT RATIO: 25
CALCIUM: 8.8 mg/dL (ref 8.7–10.4)
CHLORIDE: 103 mmol/L (ref 98–107)
CO2: 29 mmol/L (ref 20.0–31.0)
CREATININE: 0.61 mg/dL (ref 0.55–1.02)
EGFR CKD-EPI (2021) FEMALE: >90 mL/min/1.73m2 (ref >=60)
GLUCOSE RANDOM: 81 mg/dL (ref 70–179)
POTASSIUM: 4.1 mmol/L (ref 3.4–4.8)
PROTEIN TOTAL: 6.2 g/dL (ref 5.7–8.2)
SODIUM: 142 mmol/L (ref 135–145)

## 2024-07-20 LAB — MANUAL DIFFERENTIAL
BASOPHILS - ABS (DIFF): 0 10*9/L (ref 0.0–0.1)
BASOPHILS - REL (DIFF): 0 %
EOSINOPHILS - ABS (DIFF): 0.2 10*9/L (ref 0.0–0.5)
EOSINOPHILS - REL (DIFF): 1 %
LYMPHOCYTES - ABS (DIFF): 4.2 10*9/L — ABNORMAL HIGH (ref 1.1–3.6)
LYMPHOCYTES - REL (DIFF): 27 %
MONOCYTES - ABS (DIFF): 0.3 10*9/L (ref 0.3–0.8)
MONOCYTES - REL (DIFF): 2 %
NEUTROPHILS - ABS (DIFF): 10.8 10*9/L — ABNORMAL HIGH (ref 1.8–7.8)
NEUTROPHILS - REL (DIFF): 70 %

## 2024-07-20 LAB — PHOSPHORUS: PHOSPHORUS: 4.3 mg/dL (ref 2.4–5.1)

## 2024-07-20 LAB — MAGNESIUM: MAGNESIUM: 1.7 mg/dL (ref 1.6–2.6)

## 2024-07-20 MED ADMIN — ondansetron (ZOFRAN) tablet 24 mg: 24 mg | ORAL | @ 16:00:00 | Stop: 2024-07-20

## 2024-07-20 MED ADMIN — heparin, porcine (PF) 100 unit/mL injection 500 Units: 500 [IU] | INTRAVENOUS | @ 20:00:00 | Stop: 2024-07-21

## 2024-07-20 MED ADMIN — dexAMETHasone (DECADRON) tablet 12 mg: 12 mg | ORAL | @ 16:00:00 | Stop: 2024-07-20

## 2024-07-20 MED ADMIN — famotidine (PF) (PEPCID) injection 20 mg: 20 mg | INTRAVENOUS | @ 16:00:00 | Stop: 2024-07-20

## 2024-07-20 MED ADMIN — acetaminophen (TYLENOL) tablet 650 mg: 650 mg | ORAL | @ 16:00:00 | Stop: 2024-07-20

## 2024-07-20 MED ADMIN — diphenhydrAMINE (BENADRYL) capsule/tablet 25 mg: 25 mg | ORAL | @ 16:00:00 | Stop: 2024-07-20

## 2024-07-20 MED ADMIN — sacituzumab govitecan-hziy (TRODELVY) 745.5 mg in sodium chloride (non-PVC) 0.9 % 250 mL IVPB: 7.5 mg/kg | INTRAVENOUS | @ 17:00:00 | Stop: 2024-07-20

## 2024-07-20 MED ADMIN — fosaprepitant (EMEND) 150 mg in sodium chloride (NS) 0.9 % 100 mL IVPB: 150 mg | INTRAVENOUS | @ 16:00:00 | Stop: 2024-07-20

## 2024-07-20 NOTE — Unmapped (Signed)
 Pt states she has put on weight since last visit.  I feel swoll. States on on off weeks she feesl like she has been putting on weight. Also states she feels like face is more swollen than usual and says sometimes it has made her feel like her speech is effected like everything is tight and it makes it hard to pronounce things. No c/o difficulty swallowing or that tightness.

## 2024-07-20 NOTE — Unmapped (Signed)
 Pt arrived ambulatory with family to receive C4D1 Sacituzumab. Port flushed, BR+, Pre-meds given. Tolerated med. Port deaccessed, flushed, heparin  locked. Pt left stable and  ambulatory with family.

## 2024-07-21 DIAGNOSIS — Z170 Estrogen receptor positive status [ER+]: Principal | ICD-10-CM

## 2024-07-21 DIAGNOSIS — C7931 Secondary malignant neoplasm of brain: Principal | ICD-10-CM

## 2024-07-21 DIAGNOSIS — C50912 Malignant neoplasm of unspecified site of left female breast: Principal | ICD-10-CM

## 2024-07-21 NOTE — Unmapped (Signed)
 [facility] Clinical Assessment & Refill Coordination Note    Erin Gordon, DOB: 07-26-1976  Phone: [phone] (home)     All above HIPAA information was verified with patient.     Was a Nurse, learning disability used for this call? No    Specialty Medication(s):   Hematology/Oncology: Nyvepria      Current Medications[1]     Changes to medications: Khadejah reports no changes at this time.    Medication list has been reviewed and updated in Epic: Yes    Allergies[2]    Changes to allergies: No    Allergies have been reviewed and updated in Epic: Yes    SPECIALTY MEDICATION ADHERENCE     Nyvepria  6 mg/0.4ml: 0 doses of medicine on hand   Medication Adherence    Patient reported X missed doses in the last month: 0  Specialty Medication: Nyvepria  6mg           Specialty medication(s) dose(s) confirmed: Regimen is correct and unchanged.     Are there any concerns with adherence? No    Adherence counseling provided? Not needed    CLINICAL MANAGEMENT AND INTERVENTION      Clinical Benefit Assessment:    Do you feel the medicine is effective or helping your condition? Yes    Clinical Benefit counseling provided? Not needed    Adverse Effects Assessment:    Are you experiencing any side effects? No    Are you experiencing difficulty administering your medicine? No    Quality of Life Assessment:    Quality of Life      Oncology  1. What impact has your specialty medication had on the reduction of your daily pain or discomfort level?: None  2. On a scale of 1-10, how would you rate your ability to manage side effects associated with your specialty medication? (1=no issues, 10 = unable to take medication due to side effects): 1            How many days over the past month did your condition/medication  keep you from your normal activities? For example, brushing your teeth or getting up in the morning. 0    Have you discussed this with your provider? Not needed    Acute Infection Status:    Acute infections noted within Epic:  No active infections    Patient reported infection: None    Therapy Appropriateness:    Is therapy appropriate based on current medication list, adverse reactions, adherence, clinical benefit and progress toward achieving therapeutic goals? Yes, therapy is appropriate and should be continued     Clinical Intervention:    Was an intervention completed as part of this clinical assessment? No    DISEASE/MEDICATION-SPECIFIC INFORMATION      For patients on injectable medications: Next injection is scheduled for 07/28/24.    Oncology: Is the patient receiving adequate infection prevention treatment? Not applicable  Does the patient have adequate nutritional support? Not applicable    PATIENT SPECIFIC NEEDS     Does the patient have any physical, cognitive, or cultural barriers? No    Is the patient high risk? No    Does the patient require physician intervention or other additional services (i.e., nutrition, smoking cessation, social work)? No    Does the patient have an additional or emergency contact listed in their chart? Yes    SOCIAL DETERMINANTS OF HEALTH     At the [facility], we have learned that life circumstances - like trouble  affording food, housing, utilities, or transportation can affect the health of many of our patients.   That is why we wanted to ask: are you currently experiencing any life circumstances that are negatively impacting your health and/or quality of life? No    Social Drivers of Health     Food Insecurity: Food Insecurity Present (03/21/2022)    Hunger Vital Sign     Worried About Running Out of Food in the Last Year: Sometimes true     Ran Out of Food in the Last Year: Sometimes true   Tobacco Use: High Risk (04/07/2024)    Patient History     Smoking Tobacco Use: Every Day     Smokeless Tobacco Use: Never     Passive Exposure: Not on file   Transportation Needs: Unmet Transportation Needs (03/21/2022)    PRAPARE - Transportation     Lack of Transportation (Medical): No     Lack of Transportation (Non-Medical): Yes   Alcohol Use: Not on file   Housing: Not on file   Physical Activity: Not on file   Utilities: Not on file   Stress: Not on file   Interpersonal Safety: Unknown (05/04/2024)    Interpersonal Safety     Unsafe Where You Currently Live: No     Physically Hurt by Anyone: Not on file     Abused by Anyone: Not on file   Substance Use: Not on file (09/29/2023)   Intimate Partner Violence: Not on file   Social Connections: Not on file   Financial Resource Strain: High Risk (03/21/2022)    Overall Financial Resource Strain (CARDIA)     Difficulty of Paying Living Expenses: Hard   Health Literacy: Low Risk  (04/26/2021)    Health Literacy     : Never   Internet Connectivity: Not on file       Would you be willing to receive help with any of the needs that you have identified today? Not applicable       SHIPPING     Specialty Medication(s) to be Shipped:   Hematology/Oncology: Nyvepria     Other medication(s) to be shipped: No additional medications requested for fill at this time    Specialty Medications not needed at this time: N/A     Changes to insurance: No    Cost and Payment: Patient has a $0 copay, payment information is not required.    Delivery Scheduled: Yes, Expected medication delivery date: 07/26/24.     Medication will be delivered via Same Day Courier to the confirmed prescription address in [facility].    The patient will receive a drug information handout for each medication shipped and additional FDA Medication Guides as required.  Verified that patient has previously received a Conservation officer, historic buildings and a Surveyor, mining.    The patient or caregiver noted above participated in the development of this care plan and knows that they can request review of or adjustments to the care plan at any time.      All of the patient's questions and concerns have been addressed.    KATE MUNSTER, [facility]   [facility] Specialty Pharmacist       [1] Current Outpatient Medications   Medication Sig Dispense Refill    albuterol  HFA 90 mcg/actuation inhaler Inhale 2 puffs every six (6) hours as needed for wheezing. 8 g 2    aspirin 325 MG tablet Take 1 tablet (325 mg total) by mouth daily. (Patient  not taking: Reported on 06/22/2024)      dexAMETHasone  (DECADRON ) 4 MG tablet Take 2 tablets (8 mg total) by mouth daily. Take every morning on Days 2, 3, and 4 and 9, 10 and 11. 60 tablet 1    hydrocortisone  (ANUSOL -HC) 2.5 % rectal cream Insert into the rectum two (2) times a day. 30 g 5    levETIRAcetam  (KEPPRA ) 500 MG tablet Take 1 tablet (500 mg total) by mouth two (2) times a day. 180 tablet 3    loperamide  (IMODIUM ) 2 mg capsule Take 2 capsules to start, then 1 capsule every 2 hours until diarrhea free for 12 hours. 60 capsule prn    omeprazole  (PRILOSEC) 40 MG capsule Take 1 capsule (40 mg total) by mouth daily. 30 capsule 3    ondansetron  (ZOFRAN ) 8 MG tablet Take 1 tablet (8 mg total) by mouth every eight (8) hours as needed for nausea. 30 tablet 2    ondansetron  (ZOFRAN ) 8 MG tablet Take 1 tablet (8 mg total) by mouth two (2) times a day. Take on Days 2, 3, and 4 and 9, 10 and 11; And then, 8 mg every 8 hours as needed. 30 tablet 5    pegfilgrastim -apgf (NYVEPRIA ) 6 mg/0.6 mL injection Inject 0.6 mL (6 mg total) under the skin every twenty-one (21) days. Administer on day 9 of every 21 day cycle. (Chemo is D1, 8 every 21 days) 0.6 mL 5    prochlorperazine  (COMPAZINE ) 10 MG tablet Take 1 tablet (10 mg total) by mouth every six (6) hours as needed (nausea). 30 tablet 5     No current facility-administered medications for this visit.   [2] No Known Allergies

## 2024-07-21 NOTE — Unmapped (Signed)
 Addended by: LYLE RAELYN FALCON on: 07/21/2024 09:26 AM     Modules accepted: Orders

## 2024-07-26 MED FILL — NYVEPRIA 6 MG/0.6 ML SUBCUTANEOUS SYRINGE: SUBCUTANEOUS | 21 days supply | Qty: 0.6 | Fill #1

## 2024-07-27 ENCOUNTER — Inpatient Hospital Stay: Admit: 2024-07-27 | Discharge: 2024-07-27 | Payer: Medicare (Managed Care)

## 2024-07-27 ENCOUNTER — Other Ambulatory Visit: Admit: 2024-07-27 | Discharge: 2024-07-27 | Payer: Medicare (Managed Care)

## 2024-07-27 DIAGNOSIS — Z170 Estrogen receptor positive status [ER+]: Principal | ICD-10-CM

## 2024-07-27 DIAGNOSIS — C7931 Secondary malignant neoplasm of brain: Principal | ICD-10-CM

## 2024-07-27 DIAGNOSIS — C50912 Malignant neoplasm of unspecified site of left female breast: Principal | ICD-10-CM

## 2024-07-27 LAB — CBC W/ AUTO DIFF
BASOPHILS ABSOLUTE COUNT: 0 10*9/L (ref 0.0–0.1)
BASOPHILS RELATIVE PERCENT: 0.4 %
EOSINOPHILS ABSOLUTE COUNT: 0 10*9/L (ref 0.0–0.5)
EOSINOPHILS RELATIVE PERCENT: 0.5 %
HEMATOCRIT: 30.1 % — ABNORMAL LOW (ref 34.0–44.0)
HEMOGLOBIN: 9.7 g/dL — ABNORMAL LOW (ref 11.3–14.9)
LYMPHOCYTES ABSOLUTE COUNT: 1.4 10*9/L (ref 1.1–3.6)
LYMPHOCYTES RELATIVE PERCENT: 45.1 %
MEAN CORPUSCULAR HEMOGLOBIN CONC: 32.2 g/dL (ref 32.0–36.0)
MEAN CORPUSCULAR HEMOGLOBIN: 24.4 pg — ABNORMAL LOW (ref 25.9–32.4)
MEAN CORPUSCULAR VOLUME: 75.9 fL — ABNORMAL LOW (ref 77.6–95.7)
MEAN PLATELET VOLUME: 7.9 fL (ref 6.8–10.7)
MONOCYTES ABSOLUTE COUNT: 0.4 10*9/L (ref 0.3–0.8)
MONOCYTES RELATIVE PERCENT: 12.8 %
NEUTROPHILS ABSOLUTE COUNT: 1.3 10*9/L — ABNORMAL LOW (ref 1.8–7.8)
NEUTROPHILS RELATIVE PERCENT: 41.2 %
PLATELET COUNT: 205 10*9/L (ref 150–450)
RED BLOOD CELL COUNT: 3.97 10*12/L (ref 3.95–5.13)
RED CELL DISTRIBUTION WIDTH: 24.3 % — ABNORMAL HIGH (ref 12.2–15.2)
WBC ADJUSTED: 3.2 10*9/L — ABNORMAL LOW (ref 3.6–11.2)

## 2024-07-27 LAB — COMPREHENSIVE METABOLIC PANEL
ALBUMIN: 3.3 g/dL — ABNORMAL LOW (ref 3.4–5.0)
ALKALINE PHOSPHATASE: 100 U/L (ref 46–116)
ALT (SGPT): 61 U/L — ABNORMAL HIGH (ref 10–49)
ANION GAP: 13 mmol/L (ref 5–14)
AST (SGOT): 15 U/L (ref <=34)
BILIRUBIN TOTAL: 0.2 mg/dL — ABNORMAL LOW (ref 0.3–1.2)
BLOOD UREA NITROGEN: 13 mg/dL (ref 9–23)
BUN / CREAT RATIO: 25
CALCIUM: 9.4 mg/dL (ref 8.7–10.4)
CHLORIDE: 102 mmol/L (ref 98–107)
CO2: 30 mmol/L (ref 20.0–31.0)
CREATININE: 0.53 mg/dL — ABNORMAL LOW (ref 0.55–1.02)
EGFR CKD-EPI (2021) FEMALE: >90 mL/min/1.73m2 (ref >=60)
GLUCOSE RANDOM: 123 mg/dL (ref 70–179)
POTASSIUM: 3.6 mmol/L (ref 3.4–4.8)
PROTEIN TOTAL: 6.5 g/dL (ref 5.7–8.2)
SODIUM: 145 mmol/L (ref 135–145)

## 2024-07-27 LAB — PHOSPHORUS: PHOSPHORUS: 3.5 mg/dL (ref 2.4–5.1)

## 2024-07-27 LAB — SLIDE REVIEW

## 2024-07-27 LAB — MAGNESIUM: MAGNESIUM: 1.6 mg/dL (ref 1.6–2.6)

## 2024-07-27 MED ADMIN — sodium chloride (NS) 0.9 % infusion: 100 mL/h | INTRAVENOUS | @ 16:00:00

## 2024-07-27 MED ADMIN — dexAMETHasone (DECADRON) tablet 12 mg: 12 mg | ORAL | @ 16:00:00 | Stop: 2024-07-27

## 2024-07-27 MED ADMIN — famotidine (PF) (PEPCID) injection 20 mg: 20 mg | INTRAVENOUS | @ 16:00:00 | Stop: 2024-07-27

## 2024-07-27 MED ADMIN — acetaminophen (TYLENOL) tablet 650 mg: 650 mg | ORAL | @ 16:00:00 | Stop: 2024-07-27

## 2024-07-27 MED ADMIN — diphenhydrAMINE (BENADRYL) capsule/tablet 25 mg: 25 mg | ORAL | @ 16:00:00 | Stop: 2024-07-27

## 2024-07-27 MED ADMIN — ondansetron (ZOFRAN) tablet 24 mg: 24 mg | ORAL | @ 16:00:00 | Stop: 2024-07-27

## 2024-07-27 MED ADMIN — heparin, porcine (PF) 100 unit/mL injection 500 Units: 500 [IU] | INTRAVENOUS | @ 20:00:00 | Stop: 2024-07-28

## 2024-07-27 MED ADMIN — sacituzumab govitecan-hziy (TRODELVY) 745.5 mg in sodium chloride (non-PVC) 0.9 % 250 mL IVPB: 7.5 mg/kg | INTRAVENOUS | @ 18:00:00 | Stop: 2024-07-27

## 2024-07-27 MED ADMIN — fosaprepitant (EMEND) 150 mg in sodium chloride (NS) 0.9 % 100 mL IVPB: 150 mg | INTRAVENOUS | @ 17:00:00 | Stop: 2024-07-27

## 2024-07-27 NOTE — Unmapped (Signed)
 RED ZONE Means: RED ZONE: Take action now!     You need to be seen right away  Symptoms are at a severe level of discomfort    Call 911 or go to your nearest  Hospital for help     - Bleeding that will not stop    - Hard to breathe    - New seizure - Chest pain  - Fall or passing out  -Thoughts of hurting    yourself or others      Call 911 if you are going into the RED ZONE                  YELLOW ZONE Means:     Please call with any new or worsening symptom(s), even if not on this list.  Call [phone]  After hours, weekends, and holidays - you will reach a long recording with specific instructions, If not in an emergency such as above, please listen closely all the way to the end and choose the option that relates to your need.   You can be seen by a provider the same day through our Same Day Acute Care for Patients with Cancer program.      YELLOW ZONE: Take action today     Symptoms are new or worsening  You are not within your goal range for:    - Pain    - Shortness of breath    - Bleeding (nose, urine, stool, wound)    - Feeling sick to your stomach and throwing up    - Mouth sores/pain in your mouth or throat    - Hard stool or very loose stools (increase in       ostomy output)    - No urine for 12 hours    - Feeding tube or other catheter/tube issue    - Redness or pain at previous IV or port/catheter site    - Depressed or anxiety   - Swelling (leg, arm, abdomen,     face, neck)  - Skin rash or skin changes  - Wound issues (redness, drainage,    re-opened)  - Confusion  - Vision changes  - Fever >100.4 F or chills  - Worsening cough with mucus that is    green, yellow, or bloody  - Pain or burning when going to the    bathroom  - Home Infusion Pump Issue- call    [phone]         Call your healthcare provider if you are going into the YELLOW ZONE     GREEN ZONE Means:  Your symptoms are under controls  Continue to take your medicine as ordered  Keep all visits to the provider GREEN ZONE: You are in control  No increase or worsening symptoms  Able to take your medicine  Able to drink and eat    - DO NOT use MyChart messages to report red or yellow symptoms. Allow up to 3    business days for a reply.  -MyChart is for non-urgent medication refills, scheduling requests, or other general questions.         [id number] Rev. 05/23/2022  Approved by Oncology Patient Education Committee

## 2024-07-27 NOTE — Unmapped (Signed)
 Pt presents to the clinic, Chair [address] for the following???    Chemo: sacituzumab    Chemo day: C 4 D 8    Data: vital signs stable, review of systems within parameters, labs reviewed within parameters, adverse drug reactions reviewed, or family/caregiver present. IV access R CW port.    Action: BSA/agent double checked with another RN, hydration pre-treatment as ordered, antiemetics pre-treatment as ordered, or positive blood return during administration.    Response: tolerated without complaints, positive blood return, or no redness or edema at Right chest-wall port-a-cath.  Right chest-wall port-a-cath was de-access and heparin  lock .  Site was CDI and free from s/sx of infection.  AVS was decline.  Pt was discharged in stable condition.  Pt ambulated independently off the unit accompanied by her family to the front of the cancer center where her ride awaits to take her home.

## 2024-07-28 DIAGNOSIS — C7931 Secondary malignant neoplasm of brain: Principal | ICD-10-CM

## 2024-07-29 DIAGNOSIS — C7931 Secondary malignant neoplasm of brain: Principal | ICD-10-CM

## 2024-07-29 NOTE — Unmapped (Signed)
 Called pt to let her know per Shaunta:I don't recall her reporting neuropathy in her finger recently. But she did have some with her initially treatment a few years ago. I would rather evaluate her first before starting any medication. If she is having any loss of function unable move fingers, weakness then she would need a more immediate evaluation in ED.      Pt is agreeable with this plan and will go to ED for eval.Asked her to call back with any further needs.

## 2024-07-29 NOTE — Unmapped (Signed)
 Copied from CRM #[id number]. Topic: Symptom Management - Urgent Symptoms  >> Jul 29, 2024  3:42 PM Jon RAMAN wrote:      Mylah, Baynes has contacted the Communication Center on behalf of Erin Gordon in regards to the following symptom:     Pain: new onset    Caller reports that patient's treatment status is active chemotherapy.    Please contact Meha at [phone].    Symptoms deemed to be Urgent are marked High Priority    Thank you,  Jon JONELLE Shoulder   [facility]   [phone]

## 2024-07-29 NOTE — Unmapped (Signed)
 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  She said Shaunta Primus Alert FNP is already aware of these symptoms.     She said that it Started about 1 week ago. Its starting to progress more and she was wondering if there was a medication she could take for the neuropathy. It  is located on her left hand  neuropathy on the ring and pinky finger.  She said its getting a bit more numb than last weak.   She thinks she has had something in the past for neuropathy.  She said she saw Shaunta Ford-Pierce FNP and was told to call if its getting worse as they may be able to call something in.       Attempted to triage for numbness to determine acuity  but she declined the need for triage and did not want me to continue to ask questions because she said these symptoms were already known.      No weakness, no chest pain,       Triage Recommendations:   Advised that I would reach out to the care team but I was not sure if they would be able to respond before the clinic closes.  Advised that she can call after hours and page the on call provider as well.     Caller's Response: Appreciative of the call and is agreeable to this plan and will call back with any further questions/concerns     Outstanding tasks: Care team notified that the patient was advised to  2nd level (routed to the care team for input

## 2024-08-02 ENCOUNTER — Inpatient Hospital Stay: Admit: 2024-08-02 | Discharge: 2024-08-03 | Payer: Medicare (Managed Care)

## 2024-08-02 ENCOUNTER — Inpatient Hospital Stay: Admit: 2024-08-02 | Discharge: 2024-08-02 | Payer: Medicare (Managed Care)

## 2024-08-02 DIAGNOSIS — C7931 Secondary malignant neoplasm of brain: Principal | ICD-10-CM

## 2024-08-02 MED ADMIN — iohexol (OMNIPAQUE) 350 mg iodine/mL solution 100 mL: 100 mL | INTRAVENOUS | @ 14:00:00 | Stop: 2024-08-02

## 2024-08-02 MED ADMIN — Technetium Tc-99m Oxidronate HDP: 26.9 | INTRAVENOUS | @ 14:00:00 | Stop: 2024-08-02

## 2024-08-02 NOTE — Unmapped (Unsigned)
 Breast Oncology Return Patient Evaluation  Referring Physician: Zettie Reagan Bare, Md  [address]  [facility] Hem/onc  [place],  [place] [address].  PCP: [facility]    Cancer Team  Surgical Oncology: Kristalyn Gallagher, DO  Surgical Oncology NP: Izetta [place], NP and Evalene Pan, NP  Radiation Oncology: Lonell Duncan, MD  Medical Oncology: Reagan Zettie, MD  Plastic Surgery: None at this time  Genetics: None at this time Blood drawn today.    Reason for Visit: A 48 y.o. female with breast cancer referred for consultation for recommendations concerning the management of breast cancer.     Cancer stage   Cancer Staging   Malignant neoplasm of left breast in female, estrogen receptor positive    (CMS-HCC)  Staging form: Breast, AJCC 8th Edition  - Clinical stage from 03/11/2022: Stage IIIB (cT2, cN2(f), cM0, G3, ER+, PR-, HER2-) - Signed by Gallagher, Kristalyn Kay, DO on 03/12/2022  - Pathologic stage from 12/11/2022: No Stage Recommended (ypT3, pN2a, G3, ER+, PR-, HER2-) - Signed by Hackberry Pierce Hummer, ANP on 12/11/2022  - Pathologic stage from 10/28/2023: Stage IV (rpT3, pN2, cM1, GX, ER-, PR-, HER2-) - Signed by Zettie Reagan Bare, MD on 11/11/2023      [name] is a 48 year old lady recently dx with left breast cancer.     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 She was randomized to no upfront RT boost.  She has had multiple delays in her chemotherapy treatment for various reasons. Including infection and cytopenias. She completed last dose on 10/27/2022.     She is s/p left breast lumpectomy and axillary LN dissection of axillary staging on 11/27/2022 with 9 of 28 lymph nodes positive for macro metastatic carcinoma with the size of the largest tumor deposit 33 mm, treatment effect identified in multiple lymph nodes, extensive lymphovascular space invasion, and extranodal extension present less than 2 mm.  Residual left breast invasive ductal carcinoma with tumor size 64 mm in greatest dimension ER+(2%), PR -, HER2- (IHC 1+). She has Residual Cancer Burden Class: RCB-III     Adjuvant systemic therapy:  -Previously discussed option of enrolling in the ASCENT-05/OptimICE-RD (AFT-65): Phase 3, randomized, open-label study of adjuvant sacituzumab govitecan  + pembrolizumab  (pembro) vs pembro ?? capecitabine  in patients with triple-negative breast cancer and residual disease. After meeting with study coordinator she declined.   -Proceeded with SOC using Capecitabine  while continuing Pembrolizumab  every 6 weeks (last cycle on 11/26/2022)    Recurrent TNBC Metastatic breast cancer   1st line-BRE18-360 Phase I/II Study included Olapraib and Durvalumab  2nd line-Sacituzumab     She was completing her last cycle of capecitabine  when she unfortunately presented with seizure like symptoms (Facial twitching). Brain MRI showed 2 enhancing intracranial masses measuring up to 1.8cm in the right frontal lobe and 1.2cm in the posterior right parietal lobe with surrounding vasogenic edema. We obtained restaging at the time of discovering CNS progression; with no radiographic evidence of systemic disease. She enrolled in the BRE18-360 Phase I/II Study of Stereotactic Radiosurgery with Concurrent Administration of DNA Damage Response (DDR) Inhibitor (Olaparib ) Followed by Adjuvant Combination of Durvalumab ([id number]) and Physician s Choice Systemic Therapy in Subjects with Breast Cancer Brain Metastases. Patient consented and started treatment 11/20/23. Olaparib  chosen for systemic therapy given  absence of systemic disease at the time.   She completed Cyber knife to the brain on 12/03/23. She remained on study for approximately 6 months before noted progression.   -May 2025 POD new pulmonary nodules with mediastinal and bilateral hilar lymphadenopathy.There was also indeterminate foci along the hepatic dome. We discussed obtaining biopsy of distant site of disease since she initially presented with no extracranial disease. EBUS and mediastinal LN FNA was completed on 05/04/2024 confirming metastatic TNBC. Sample sent for tempus testing.We proceeded with [facility] sacituzumab govitecan  at this time    She is s/p C4 she is tolerating okay; although she reports significant fatigue a few days following treatment each week. She is concern about the dosing and inquires if a dose reduction is possible. She describes grade 2/3 fatigue interfering with daily activities. We discussed DR in effort to improve tolerance. Of note She did have tx hold last cycle due to neutropenia, we have add GSCF support Day 10 patient self injects.      Proceed with Sacituzumab 7.5 mg/kg Days 1,8 every 21 days ( DR starting C4)   Pegfilgrastim  6 mg on day 10    Future clinical trials include : UARMR933 and TRADE-DXD since primary breast biopsy showed HER2 low disease. (IHC 1+)  She is not eligibile for UARMR941 given brain mets.    #Brain Mets  -s/p Cyber knife to the brain on 12/03/23.  Recent MRI brain showed concern for radiation necrosis v possible progression of treated lesions.   -Repeat MRI brain on 06/20/24 showed decreased size of lesions and decreased vasogenic edema.   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 She was recently switched back to Keppra  per Neuro.   -She denies any recent twitching in jaw and eye.  -Continue follow up with neurology.     Genetics:   - Germline testing: STAT Panel plus ATM and CHEK2 sent to [facility] - Negative   -Tempus; PD-L1 expression neg, she has TPN53 mut. No other actionable mutations at this time.     Diarrhea  Diarrhea due to chemotherapy, causing hemorrhoidal bleeding. Managed with Imodium .  - Instruct to take two Imodium  pills at onset of diarrhea and one after each loose stool, not exceeding eight tablets per day. This has been helpful.    Hemorrhoids  Hemorrhoidal bleeding exacerbated by diarrhea. Possible gastroenterology referral if bleeding persists.  - Monitor for recurrent bleeding.  - Consider referral to gastroenterology if bleeding persists.    Hidradenitis suppurativa  Recurrent axillary boils related to hidradenitis suppurativa. Treated with antibiotics. Dermatology referral planned for further management.  - Extend antibiotic course by five days since she will receive immunosuppressive drug.   - Referred to dermatology for further management.    Supportive Care  -Low ACTH, cortisol normal and patient overall asymptomatic - econsult to endocrinology. ACTH <5.0 and Cortisol level 2.6 she is currently on steroid so may have affected results. We have consulted Endocrinology.   She has not had this level repeated, we discussed having done locally to facilitate timing of collection early morning. She is agreeable.   - HTN: patient taking hydrochlorothiazide . BP normalized  -Tobacco use we discussed smoking cessation; she is interested. Referral placed.  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 Will refer to lymphedema clinic in future if needed. Previously sent a referral for breast  sleeve with [facility] and see if insurance will cover.  -Coping she endorses difficulty coping with new metastatic breast cancer diagnosis. We disccussed CCSP, she was agreeable but has declined.    -Dry eyes with intermittent cloudiness; recommend  Refresh eye drops regularly. Will also Refer to ophthalmologist for comprehensive eye exam.   -Edema lower extremities and wt gain. Steroids may contribute to increased appetite and wt gain. We will reduce Day 2,3 dexamethasone  to 4 mg daily. Encourage sodium intake reduction to manage edema, dietary intake includes lots of processed foods and take out. Goals of Care:   Ms. Barrales wants to receive therapy and live a long life, enjoy her family and feel well.     Dental issues   -Possibly exacerbated by cancer treatment; significant dental issues, including tooth loss and gum irritation/gingivitis.   - Refer to a dentist for evaluation.    RTC:  Restaging scans planned for 08/02/24   Follow up with me to discuss results 9/12.     ---------------------------------------------------------------------------------------------------------------------------------------------    HPI: Erin Gordon is a 48 y.o. female who returns today for follow up and continuation of treatment for recently dx breast cancer .    Interval hx:   History of Present Illness    Erin Gordon is a 48 year old female with metastatic cancer who presents for follow up and ongoing management of disease.  She is accompanied by her fiance.     She experiences significant swelling in her feet, legs, face, and neck, occurring intermittently every other day. The swelling is accompanied by a sensation of being 'weighted down' and fatigue, particularly during the second week after chemotherapy. The swelling sometimes subsides but then returns.    She reports changes in her vision, including dry eyes and occasional cloudiness that clears up later. No double vision, loss of vision, or seeing spots, stars, or flashing lights.     She is currently on chemotherapy, which causes significant fatigue and swelling. She takes dexamethasone , 8 mg daily, which she believes contributes to her appetite and weight gain.  No nausea, vomiting, constipation, diarrhea, or bleeding. She reports feeling as if she has 'fifteen blankets' on her body due to fatigue, especially during the rest week after chemotherapy.        Performance status= ECOG 0    Breast Cancer History  Hematology/Oncology History Overview Note   2023: Left breast cT2 cN2 IDC, G3, weakly+2%/-/-     Malignant neoplasm of left breast in female, estrogen receptor positive    (CMS-HCC)   02/12/2022 -  Presenting Symptoms    Self palpated left breast mass    MMG/US : Left breast 6:00 5 CFN there is a 3.2 x 1.8 x 2.4 cm irregular cystic and solid mass. Echogenic foci within the mass likely correspond to pleomorphic calcifications seen on mammography.  Left breast 6:00 2 CFN there is a 1.0 x 0.5 x 0.7 cm hypoechoic circumscribed mass with internal vascularity.  Left axilla there is multiple abnormally enlarged lymph nodes, largest demonstrating a cortical thickness of 1.9 cm.  Right breast 9:00 retroareolar there is  a 0.6 x 0.4 x  0.5 cm irregular hypoechoic mass.     03/03/2022 Biopsy    Left breast core biopsy, 6:00 5 CFN: IDC, G3, ER+(2%), ER-, Her2-(1+).    Left breast core biopsy, 6:00 2 CFN: benign breast tissue with stromal fibrosis and apocrine cysts.    Left axilla core biopsy: IDC, G3, ER+(2%), ER-, Her2-(1+).  Right breast core biopsy, retroareolar: benign breast tissue with stromal fibrosis and apocrine cysts.     03/11/2022 Initial Diagnosis    Malignant neoplasm of left breast in female, estrogen receptor positive (CMS-HCC)     03/11/2022 -  Cancer Staged    Staging form: Breast, AJCC 8th Edition  - Clinical stage from 03/11/2022: Stage IIIB (cT2, cN2(f), cM0, G3, ER+, PR-, HER2-) - Signed by Cay Shawnee Shaggy, DO on 03/12/2022       03/12/2022 Tumor Board    MDC recs: Left breast cT2 N1 IDC, G3, ER+(2%), PR-, HER2-  Imaging review with 3.2 cm area bx proven IDC and multiple (>7) enlarged axillary LNs. Additional left breast bx benign and concordant. Right breast bx benign and concordant.  NACT given locally advanced, schedule with med/onc add on next week  Staging studies  Possible PRAD trial candidate  Genetics: Meets GT criteria based on dx br cancer <50yo. Stat breast panel + ATM + CHEK2 and econsult     03/25/2022 Interval Scan(s)      MRI bilateral breast completed revealed 3.7 cm transverse by 3.3 cm AP by 2.6 cm craniocaudal. It demonstrates central necrosis with peripheral nodular enhancement. There is also an oval mass measuring 9 mm in the left breast, this mass has not been biopsied. However biopsied was recommended since patient is considering breast conservation surgery post chemotherapy. Patient was scheduled for biopsy of the 9 mm mass, but ultimately declined biopsy due to claustrophobia.     04/23/2022 -  Chemotherapy    STUDY TBCRC-053 IRB# [id number] NEOADJUVANT PEMBROLIZUMAB  +/- RT WITH PACLItaxel / CARBOplatin FOLLOWED BY ddAC (v. 05/16/21)  P-RAD: A Randomized Study of Preoperative Chemotherapy, Pembrolizumab  and No, Low or High Dose RADiation in Node-Positive, HER2-Negative Breast Cancer     12/11/2022 -  Cancer Staged    Staging form: Breast, AJCC 8th Edition  - Pathologic stage from 12/11/2022: No Stage Recommended (ypT3, pN2a, G3, ER+, PR-, HER2-) - Signed by Linnie Pierce Hummer, ANP on 12/11/2022       10/28/2023 -  Cancer Staged    Staging form: Breast, AJCC 8th Edition  - Pathologic stage from 10/28/2023: Stage IV (rpT3, pN2, cM1, GX, ER-, PR-, HER2-) - Signed by Zettie Reagan Bare, MD on 11/11/2023       Malignant neoplasm of left breast in female, estrogen receptor positive, unspecified site of breast    (CMS-HCC)   03/25/2022 Interval Scan(s)      MRI bilateral breast completed revealed 3.7 cm transverse by 3.3 cm AP by 2.6 cm craniocaudal. It demonstrates central necrosis with peripheral nodular enhancement. There is also an oval mass measuring 9 mm in the left breast, this mass has not been biopsied. However biopsied was recommended since patient is considering breast conservation surgery post chemotherapy. Patient was scheduled for biopsy of the 9 mm mass, but ultimately declined biopsy due to claustrophobia.     03/26/2022 Initial Diagnosis    Malignant neoplasm of left breast in female, estrogen receptor positive, unspecified site of breast (CMS-HCC)     03/26/2022 - 06/08/2022 Radiation    Radiation Therapy Treatment Details (03/26/2022 - 06/08/2022)  Site: Right Breast  Technique: IMRT  Goal: Curative  Planned Treatment Start Date: No planned start date specified     01/01/2023 -  Radiation    Radiation Therapy Treatment Details (Noted on 01/01/2023)  Site: Left Breast with lymph nodes  Technique: VMAT  Goal: No goal specified  Planned Treatment Start Date: No planned start  date specified     Metastasis to brain    (CMS-HCC)   11/11/2023 Initial Diagnosis    Metastasis to brain (CMS-HCC)     11/11/2023 -  Radiation    Radiation Therapy Treatment Details (Noted on 11/11/2023)  Site: Right Brain  Technique: SRT  Goal: Control  Planned Treatment Start Date: No planned start date specified         Past Medical History  Past Medical History:   Diagnosis Date    Hypertension     Malignant neoplasm of left breast in female, estrogen receptor positive    (CMS-HCC) 03/11/2022     HTN - stopped taking hydrochlorothiazide . Goes to [facility].    GERD - taking PRN PPI  Anxiety - not treated        Reproductive/GYN History  OB History   Gravida Para Term Preterm AB Living   4 4 4  0 0 0   SAB IAB Ectopic Molar Multiple Live Births   0 0 0 0 0 0      # Outcome Date GA Lbr Len/2nd Weight Sex Type Anes PTL Lv   4 Term            3 Term            2 Term            1 Term                Surgical History  Past Surgical History:   Procedure Laterality Date    BREAST BIOPSY      BREAST LUMPECTOMY Left 11/27/2022    CHEMOTHERAPY      HYSTERECTOMY      IR INSERT PORT AGE GREATER THAN 5 YRS  04/23/2022    IR INSERT PORT AGE GREATER THAN 5 YRS 04/23/2022 Courtney Harris Evron, PA IMG VIR [facility]    PR BRNCHSC EBUS GUIDED SAMPL 1/2 NODE STATION/STRUX N/A 05/04/2024    Procedure: BRONCH, RIGID OR FLEXIBLE, INC FLUORO GUIDANCE, WHEN PERFORMED; WITH EBUS GUIDED TRANSTRACHEAL AND/OR TRANSBRONCHIAL SAMPLING, ONE OR TWO MEDIASTINAL AND/OR HILAR LYMPH NODE STATIONS OR STRUCTURES;  Surgeon: Fontaine Selinda Beam, MD;  Location: OR [facility];  Service: Pulmonary    PR INTRAOPERATIVE SENTINEL LYMPH NODE ID W DYE INJECTION Left 11/27/2022    Procedure: INTRAOPERATIVE IDENTIFICATION SENTINEL LYMPH NODE(S) INCLUDE INJECTION NON-RADIOACTIVE DYE, WHEN PERFORMED;  Surgeon: Iva Cay Murray, DO;  Location: [facility];  Service: Surgical Oncology Breast    PR MASTECTOMY, PARTIAL Left 11/27/2022    Procedure: MASTECTOMY, PARTIAL (EG, LUMPECTOMY, TYLECTOMY, QUADRANTECTOMY, SEGMENTECTOMY);  Surgeon: Gallagher, Kristalyn Kay, DO;  Location: [facility];  Service: Surgical Oncology Breast    PR REMOVE ARMPITS LYMPH NODES COMPLT Left 11/27/2022    Procedure: AXILLARY LYMPHADENECTOMY; COMPLETE;  Surgeon: Iva Cay Murray, DO;  Location: [facility];  Service: Surgical Oncology Breast    RADIATION         Medications    Current Outpatient Medications:     albuterol  HFA 90 mcg/actuation inhaler, Inhale 2 puffs every six (6) hours as needed for wheezing., Disp: 8 g, Rfl: 2    aspirin 325 MG tablet, Take 1 tablet (325 mg total) by mouth daily. (Patient not taking: Reported on 06/22/2024), Disp: , Rfl:     dexAMETHasone  (DECADRON ) 4 MG tablet, Take 2 tablets (8 mg total) by mouth daily. Take every morning on Days 2, 3, and 4 and 9, 10 and 11., Disp: 60 tablet, Rfl: 1  hydrocortisone  (ANUSOL -HC) 2.5 % rectal cream, Insert into the rectum two (2) times a day., Disp: 30 g, Rfl: 5    levETIRAcetam  (KEPPRA ) 500 MG tablet, Take 1 tablet (500 mg total) by mouth two (2) times a day., Disp: 180 tablet, Rfl: 3    loperamide  (IMODIUM ) 2 mg capsule, Take 2 capsules to start, then 1 capsule every 2 hours until diarrhea free for 12 hours., Disp: 60 capsule, Rfl: prn    omeprazole  (PRILOSEC) 40 MG capsule, Take 1 capsule (40 mg total) by mouth daily., Disp: 30 capsule, Rfl: 3    ondansetron  (ZOFRAN ) 8 MG tablet, Take 1 tablet (8 mg total) by mouth every eight (8) hours as needed for nausea., Disp: 30 tablet, Rfl: 2    ondansetron  (ZOFRAN ) 8 MG tablet, Take 1 tablet (8 mg total) by mouth two (2) times a day. Take on Days 2, 3, and 4 and 9, 10 and 11; And then, 8 mg every 8 hours as needed., Disp: 30 tablet, Rfl: 5    pegfilgrastim -apgf (NYVEPRIA ) 6 mg/0.6 mL injection, Inject 0.6 mL (6 mg total) under the skin every 21 days. Administer on day 9 of every 21 day cycle. (Chemo is D1, 8 every 21 days), Disp: 0.6 mL, Rfl: 5    prochlorperazine  (COMPAZINE ) 10 MG tablet, Take 1 tablet (10 mg total) by mouth every six (6) hours as needed (nausea)., Disp: 30 tablet, Rfl: 5  No current facility-administered medications for this visit.    Facility-Administered Medications Ordered in Other Visits:     [START ON 08/03/2024] heparin , porcine (PF) 100 unit/mL injection 500 Units, 500 Units, Intravenous, Mon,Wed,Fri, Abdou, Reagan Bare, MD    Allergies  No Known Allergies    Personal and Social History  Social History     Social History Narrative    The patient is committed relationship.  She works as Scientist, research (medical). She has four children.   Lives in [place], [place] with her long-term boyfriend, Burnetta, and two of her children. She has 4 children and now 5 grand children aged 28mo-29yrs. 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 Loves spending time at [employer]. Has been to the beach around 5 times the past year including [address], [address]     Smoking 1ppd x 10-15 years. Has quit before.  MJ smoking 1-4 joints per day  No cocaine, heroin, no IVDU  EtOH socially 2-3x weekly      Family History  Family History   Problem Relation Age of Onset    Diabetes Mother     Hypertension Mother     Parkinsonism Father     No Known Problems Sister     No Known Problems Daughter     No Known Problems Maternal Grandmother     No Known Problems Maternal Grandfather     Ovarian cancer Paternal Grandmother 59    Cancer Paternal Uncle         colon or prostate ?age    Breast cancer Paternal Cousin 13        recent dx, limited cntact    Breast cancer Other         3x paternal great aunts (PGF sisters) with breast ca ?ages; there may be other more distant paternal cousins w/ bresat ca    BRCA 1/2 Neg Hx     Colon cancer Neg Hx     Endometrial cancer Neg Hx    Parents still alive. Mother with DM. Father with  parkinsons.   Uncle (paternal) cancer  Uncle (paternal) prostate  MGF colon cancer  Paternal aunt with breast cancer      Review of Systems: A 12-system review of systems was obtained including: Constitutional, Eyes, ENT, Cardiovascular, Respiratory, GI, GU, Musculoskeletal, Skin, Neurological, Psychiatric, Endocrine, Heme/Lymphatic, and Allergic/Immunologic systems. It is negative or non-contributory to the patient???s management except for the following: See Interval History.    Physical Examination:  Vital Signs: There were no vitals taken for this visit.  General:  Healthy-appearing female in no acute distress.  Cardiovasc:  No heaves, regular, no additional sounds. No lower extremity edema.    Respiratory:  Chest clear to percussion and auscultation, unlabored. Implanted port located in upper right chest, site clean, dry no swelling or erythema.  Gastrointestinal:  Soft, nontender, no hepatomegaly.   Musculoskeletal:  No bony pain or tenderness.   Skin and Subcutaneous Tissues:  Negative. No rash, ecchymoses, purpuric lesions.  Psychiatric: Mood is normal. Appropriately anxious.  No other symptoms.   Neuro:  Alert and oriented.  Gait and coordination normal  Upper Extremity Lymphedema: None.   Breasts:  Left breast with radiation changes, well healed lumpectomy scar. Palpable 2cm nodule in the lateral left breast cannot appreciate on exam today.  Right breast no palpable mass or nodules.   Right axilla small palpable nodule consistent with treated abscess, no inflammation or erythema.     I have personally reviewed the following diagnostic studies:  Lab Results   Component Value Date    WBC 3.2 (L) 07/27/2024    HGB 9.7 (L) 07/27/2024    HCT 30.1 (L) 07/27/2024    PLT 205 07/27/2024    CHOL 181 06/03/2021    TRIG 82 06/03/2021    HDL 43 06/03/2021    ALT 61 (H) 07/27/2024    AST 15 07/27/2024    NA 145 07/27/2024    K 3.6 07/27/2024    CL 102 07/27/2024    CREATININE 0.53 (L) 07/27/2024    BUN 13 07/27/2024    CO2 30.0 07/27/2024    TSH 1.487 04/20/2024    INR 0.85 11/20/2023

## 2024-08-03 DIAGNOSIS — C50912 Malignant neoplasm of unspecified site of left female breast: Principal | ICD-10-CM

## 2024-08-03 DIAGNOSIS — C7931 Secondary malignant neoplasm of brain: Principal | ICD-10-CM

## 2024-08-03 DIAGNOSIS — Z170 Estrogen receptor positive status [ER+]: Principal | ICD-10-CM

## 2024-08-03 NOTE — Unmapped (Signed)
 Spoke with Ms. Baena and reviewed CT body and bone scan results. Discussed that there is progression in her bones, new lesions and that I would like her to see Dr. Myer to discuss next treatment options. Patient made aware that Dr. Myer is one our breast medical oncologist that Dr. Zettie designated for her to see in her absence.  I answered her questions and she agrees to appointment to see Dr. Myer next week. She was appreciative of the call.

## 2024-08-06 DIAGNOSIS — C50912 Malignant neoplasm of unspecified site of left female breast: Principal | ICD-10-CM

## 2024-08-06 DIAGNOSIS — Z170 Estrogen receptor positive status [ER+]: Principal | ICD-10-CM

## 2024-08-06 DIAGNOSIS — C7931 Secondary malignant neoplasm of brain: Principal | ICD-10-CM

## 2024-08-07 NOTE — Unmapped (Signed)
 Breast Oncology Return Patient Evaluation  Referring Physician: Pcp, None Per Patient  [address]  [place],  [place] [address].  PCP: [facility]    Cancer Team  Surgical Oncology: Kristalyn Gallagher, DO  Surgical Oncology NP: Izetta Rathbun, NP and Evalene Pan, NP  Radiation Oncology: Lonell Duncan, MD  Medical Oncology: Reagan Cloud, MD  Plastic Surgery: None at this time  Genetics: None at this time Blood drawn today.    Reason for Visit: A 48 y.o. female with breast cancer referred for consultation for recommendations concerning the management of breast cancer.     Cancer stage   Cancer Staging   Malignant neoplasm of left breast in female, estrogen receptor positive    (CMS-HCC)  Staging form: Breast, AJCC 8th Edition  - Clinical stage from 03/11/2022: Stage IIIB (cT2, cN2(f), cM0, G3, ER+, PR-, HER2-) - Signed by Gallagher, Kristalyn Kay, DO on 03/12/2022  - Pathologic stage from 12/11/2022: No Stage Recommended (ypT3, pN2a, G3, ER+, PR-, HER2-) - Signed by [place] Pierce Hummer, ANP on 12/11/2022  - Pathologic stage from 10/28/2023: Stage IV (rpT3, pN2, cM1, GX, ER-, PR-, HER2-) - Signed by Cloud Reagan Bare, MD on 11/11/2023      [name] is a 48 year old lady recently dx with left breast cancer.     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 She was randomized to no upfront RT boost.  She has had multiple delays in her chemotherapy treatment for various reasons. Including infection and cytopenias. She completed last dose on 10/27/2022.     She is s/p left breast lumpectomy and axillary LN dissection of axillary staging on 11/27/2022 with 9 of 28 lymph nodes positive for macro metastatic carcinoma with the size of the largest tumor deposit 33 mm, treatment effect identified in multiple lymph nodes, extensive lymphovascular space invasion, and extranodal extension present less than 2 mm.  Residual left breast invasive ductal carcinoma with tumor size 64 mm in greatest dimension ER+(2%), PR -, HER2- (IHC 1+). She has Residual Cancer Burden Class: RCB-III     Adjuvant systemic therapy:  -Previously discussed option of enrolling in the ASCENT-05/OptimICE-RD (AFT-65): Phase 3, randomized, open-label study of adjuvant sacituzumab govitecan  + pembrolizumab  (pembro) vs pembro ?? capecitabine  in patients with triple-negative breast cancer and residual disease. After meeting with study coordinator she declined.   -Proceeded with SOC using Capecitabine  while continuing Pembrolizumab  every 6 weeks (last cycle on 11/26/2022)    Recurrent TNBC Metastatic breast cancer   1st line-BRE18-360 Phase I/II Study included Olapraib and Durvalumab  2nd line-Sacituzumab   3rd line- Enhertu    She was completing her last cycle of capecitabine  when she unfortunately presented with seizure like symptoms (Facial twitching-10/2023). Brain MRI showed 2 enhancing intracranial masses measuring up to 1.8cm in the right frontal lobe and 1.2cm in the posterior right parietal lobe with surrounding vasogenic edema. We obtained restaging at the time of discovering CNS progression; with no radiographic evidence of systemic disease. She enrolled in the BRE18-360 Phase I/II Study of Stereotactic Radiosurgery with Concurrent Administration of DNA Damage Response (DDR) Inhibitor (Olaparib ) Followed by Adjuvant Combination of Durvalumab ([id number]) and Physician s Choice Systemic Therapy in Subjects with Breast Cancer Brain Metastases. Patient consented and started treatment 11/20/23. Olaparib  chosen for  systemic therapy given absence of systemic disease at the time.   She completed Cyber knife to the brain on 12/03/23. She remained on study for approximately 6 months before noted progression.   -May 2025 POD new pulmonary nodules with mediastinal and bilateral hilar lymphadenopathy.There was also indeterminate foci along the hepatic dome. We discussed obtaining biopsy of distant site of disease since she initially presented with no extracranial disease. EBUS and mediastinal LN FNA was completed on 05/04/2024 confirming metastatic TNBC. Sample sent for tempus testing.We proceeded with SOC sacituzumab govitecan  at this time    -August 2025. She is s/p C4 she is tolerating okay; although she reports significant fatigue a few days following treatment each week. She is concern about the dosing and inquires if a dose reduction is possible. She describes grade 2/3 fatigue interfering with daily activities. We discussed DR in effort to improve tolerance. Of note She did have tx hold last cycle due to neutropenia, we have add GSCF support Day 10 patient self injects.      S/p Sacituzumab 7.5 mg/kg Days 1,8 every 21 days ( DR starting C4)  and Pegfilgrastim  6 mg on day 10    -September 2025. She had progressive disease with new bone lesions on 08/02/24 and a pathological fracture.Reports weight gain and new lower extremity swelling while on Sacituzumab. Voiced that she would like to proceed with standard of care treatment as opposed to clinical trials. We discussed starting Enhertu and after discussing general side effects, she agreed. We weill have our pharmacist reach out for formal chemo education. Her father had just passed away this weekend and is arranging the funeral; she would like to resume treatment in 2 weeks once she returns.    Plan for Enhertu with Zometa in 2 weeks    Clinical trials: [id number]. TRADE-DXD not eligible since previous Saci.  She is not eligibile for UARMR941 given brain mets.    New Bone mets to T9, T11, L1 and S1  Pathologic fracture of T11  -Seen on CT A/P and NM bone scan 08/02/24  -Denies any new bone pain (reports sacral pain due to weight gain)  -Start Zometa for bone strengthening    Brain Mets  -s/p Cyber knife to the brain on 12/03/23.  Recent MRI brain showed concern for radiation necrosis v possible progression of treated lesions.   -Repeat MRI brain on 06/20/24 showed decreased size of lesions and decreased vasogenic edema.   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 She was recently switched back to Keppra  per Neuro.   -She denies any recent twitching in jaw and eye.  -Continue follow up with neurology.     Genetics:   - Germline testing: STAT Panel plus ATM and CHEK2 sent to [facility] - Negative   -Tempus; PD-L1 expression neg, she has TPN53 mut. No other actionable mutations at this time.     Diarrhea  Diarrhea due to chemotherapy, causing hemorrhoidal bleeding. Managed with Imodium .  - Instruct to take two Imodium  pills at onset of diarrhea and one after each loose stool, not exceeding eight tablets per day. This has been helpful.    Hemorrhoids  Hemorrhoidal bleeding exacerbated by diarrhea. Possible gastroenterology referral if bleeding persists.  - Monitor for recurrent bleeding.  - Consider referral to gastroenterology if bleeding persists.    Hidradenitis suppurativa  Recurrent axillary boils related to hidradenitis suppurativa. Treated  with antibiotics. Dermatology referral planned for further management.  - Extend antibiotic course by five days since she will receive immunosuppressive drug.   - Referred to dermatology for further management.    Supportive Care  -Low ACTH, cortisol normal and patient overall asymptomatic - econsult to endocrinology. ACTH <5.0 and Cortisol level 2.6 she is currently on steroid so may have affected results. We have consulted Endocrinology.   She has not had this level repeated, we discussed having done locally to facilitate timing of collection early morning. She is agreeable.   - HTN: patient taking hydrochlorothiazide . BP normalized  -Tobacco use we discussed smoking cessation; she is interested. Referral placed.  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 Will refer to lymphedema clinic in future if needed. Previously sent a referral for breast sleeve with [facility] and see if insurance will cover.  -Coping she endorses difficulty coping with new metastatic breast cancer diagnosis. We disccussed CCSP, she was agreeable but has declined.    -Dry eyes with intermittent cloudiness; recommend  Refresh eye drops regularly. Will also Refer to ophthalmologist for comprehensive eye exam.   -Edema lower extremities and wt gain. Steroids may contribute to increased appetite and wt gain. We will reduce Day 2,3 dexamethasone  to 4 mg daily. Encourage sodium intake reduction to manage edema, dietary intake includes lots of processed foods and take out.     Goals of Care:   Ms. Bloodgood wants to receive therapy and live a long life, enjoy her family and feel well.     Dental issues   -Possibly exacerbated by cancer treatment; significant dental issues, including tooth loss and gum irritation/gingivitis.   - Refer to a dentist for evaluation.    RTC:  --08/19/24 for initiation of Enhertu + Zometa with labs  --MRI Brain and MRI T spine    Patient seen and discussed with Dr. Myer. Attending attestation to follow.    Erin Verstraete Hayes Agreste, DO, MS  PGY-5 Hematology/Oncology Fellow    ---------------------------------------------------------------------------------------------------------------------------------------------    HPI: Erin Gordon is a 48 y.o. female who returns today for follow up and continuation of treatment for recently dx breast cancer .    Interval hx:   History of Present Illness  Erin Gordon is a 48 year old female with metastatic cancer who presents for evaluation of new sclerotic lesions in the spine.    Doing ok since last infusion. She had numbness and tingling and weakness in her left 4/5th fingers, which worsened two weeks ago but has since improved to just tingling. She had called nurse triage line to directed her to the ED but she did not want to go.    She experiences back pain that correlates with weight gain, particularly affecting her tailbone area. The pain is intermittent, worsening at night, and is managed with Tylenol  and ibuprofen taken three to four days a week, which she finds effective.    She has been experiencing weight gain, attributed to steroid use and her current chemotherapy regimen, sacituzumab. This weight gain has led to increased swelling, particularly in her feet and face, with her weight fluctuating between 219 and 230 pounds.    She has a history of seizures but has not experienced any recent seizures. Occasional tremors are attributed to her seizure medication, Keppra . No new focal weakness, chest pain, shortness of breath, or gastrointestinal symptoms. She reports fatigue and a lack of  energy, attributed to her chemotherapy regimen. Despite this, she remains active at home, engaging in activities such as cleaning and caring for her grandchildren.    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 She takes steroids, specifically two 4 mg pills on specific days following her chemotherapy treatment, along with Zofran  for nausea.    She recently experienced swelling in her feet, which began about a week ago, attributed to her chemotherapy treatment and associated steroid use. She has not been using compression socks or elevating her feet due to recent personal stressors, including the recent passing of her father.    Performance status= ECOG 0    Breast Cancer History  Hematology/Oncology History Overview Note   2023: Left breast cT2 cN2 IDC, G3, weakly+2%/-/-     Malignant neoplasm of left breast in female, estrogen receptor positive    (CMS-HCC)   02/12/2022 -  Presenting Symptoms    Self palpated left breast mass    MMG/US : Left breast 6:00 5 CFN there is a 3.2 x 1.8 x 2.4 cm irregular cystic and solid mass. Echogenic foci within the mass likely correspond to pleomorphic calcifications seen on mammography.  Left breast 6:00 2 CFN there is a 1.0 x 0.5 x 0.7 cm hypoechoic circumscribed mass with internal vascularity.  Left axilla there is multiple abnormally enlarged lymph nodes, largest demonstrating a cortical thickness of 1.9 cm.  Right breast 9:00 retroareolar there is  a 0.6 x 0.4 x  0.5 cm irregular hypoechoic mass.     03/03/2022 Biopsy    Left breast core biopsy, 6:00 5 CFN: IDC, G3, ER+(2%), ER-, Her2-(1+).    Left breast core biopsy, 6:00 2 CFN: benign breast tissue with stromal fibrosis and apocrine cysts.    Left axilla core biopsy: IDC, G3, ER+(2%), ER-, Her2-(1+).    Right breast core biopsy, retroareolar: benign breast tissue with stromal fibrosis and apocrine cysts.     03/11/2022 Initial Diagnosis    Malignant neoplasm of left breast in female, estrogen receptor positive (CMS-HCC)     03/11/2022 -  Cancer Staged    Staging form: Breast, AJCC 8th Edition  - Clinical stage from 03/11/2022: Stage IIIB (cT2, cN2(f), cM0, G3, ER+, PR-, HER2-) - Signed by [place] Shaggy, DO on 03/12/2022       03/12/2022 Tumor Board    MDC recs: Left breast cT2 N1 IDC, G3, ER+(2%), PR-, HER2-  Imaging review with 3.2 cm area bx proven IDC and multiple (>7) enlarged axillary LNs. Additional left breast bx benign and concordant. Right breast bx benign and concordant.  NACT given locally advanced, schedule with med/onc add on next week  Staging studies  Possible PRAD trial candidate  Genetics: Meets GT criteria based on dx br cancer <50yo. Stat breast panel + ATM + CHEK2 and econsult     03/25/2022 Interval Scan(s)      MRI bilateral breast completed revealed 3.7 cm transverse by 3.3 cm AP by 2.6 cm craniocaudal. It demonstrates central necrosis with peripheral nodular enhancement. There is also an oval mass measuring 9 mm in the left breast, this mass has not been biopsied. However biopsied was recommended since patient is considering breast conservation surgery post chemotherapy. Patient was scheduled for biopsy of the 9 mm mass, but ultimately declined biopsy due to claustrophobia.     04/23/2022 -  Chemotherapy    STUDY [id number]-053 IRB# [id number] NEOADJUVANT PEMBROLIZUMAB  +/- RT  WITH PACLItaxel / CARBOplatin FOLLOWED BY ddAC (v. 05/16/21)  P-RAD: A Randomized Study of Preoperative Chemotherapy, Pembrolizumab  and No, Low or High Dose RADiation in Node-Positive, HER2-Negative Breast Cancer     12/11/2022 -  Cancer Staged    Staging form: Breast, AJCC 8th Edition  - Pathologic stage from 12/11/2022: No Stage Recommended (ypT3, pN2a, G3, ER+, PR-, HER2-) - Signed by Linnie Pierce Hummer, ANP on 12/11/2022       10/28/2023 -  Cancer Staged    Staging form: Breast, AJCC 8th Edition  - Pathologic stage from 10/28/2023: Stage IV (rpT3, pN2, cM1, GX, ER-, PR-, HER2-) - Signed by Zettie Reagan Bare, MD on 11/11/2023       Malignant neoplasm of left breast in female, estrogen receptor positive, unspecified site of breast    (CMS-HCC)   03/25/2022 Interval Scan(s)      MRI bilateral breast completed revealed 3.7 cm transverse by 3.3 cm AP by 2.6 cm craniocaudal. It demonstrates central necrosis with peripheral nodular enhancement. There is also an oval mass measuring 9 mm in the left breast, this mass has not been biopsied. However biopsied was recommended since patient is considering breast conservation surgery post chemotherapy. Patient was scheduled for biopsy of the 9 mm mass, but ultimately declined biopsy due to claustrophobia.     03/26/2022 Initial Diagnosis    Malignant neoplasm of left breast in female, estrogen receptor positive, unspecified site of breast (CMS-HCC)     03/26/2022 - 06/08/2022 Radiation    Radiation Therapy Treatment Details (03/26/2022 - 06/08/2022)  Site: Right Breast  Technique: IMRT  Goal: Curative  Planned Treatment Start Date: No planned start date specified     01/01/2023 -  Radiation    Radiation Therapy Treatment Details (Noted on 01/01/2023)  Site: Left Breast with lymph nodes  Technique: VMAT  Goal: No goal specified  Planned Treatment Start Date: No planned start date specified     Metastasis to brain    (CMS-HCC)   11/11/2023 Initial Diagnosis    Metastasis to brain (CMS-HCC)     11/11/2023 -  Radiation    Radiation Therapy Treatment Details (Noted on 11/11/2023)  Site: Right Brain  Technique: SRT  Goal: Control  Planned Treatment Start Date: No planned start date specified         Past Medical History  Past Medical History:   Diagnosis Date    Hypertension     Malignant neoplasm of left breast in female, estrogen receptor positive    (CMS-HCC) 03/11/2022     HTN - stopped taking hydrochlorothiazide . Goes to [facility].    GERD - taking PRN PPI  Anxiety - not treated        Reproductive/GYN History  OB History   Gravida Para Term Preterm AB Living   4 4 4  0 0 0   SAB IAB Ectopic Molar Multiple Live Births   0 0 0 0 0 0      # Outcome Date GA Lbr Len/2nd Weight Sex Type Anes PTL Lv   4 Term            3 Term            2 Term            1 Term                Surgical History  Past Surgical History:   Procedure Laterality Date    BREAST BIOPSY  BREAST LUMPECTOMY Left 11/27/2022    CHEMOTHERAPY      HYSTERECTOMY      IR INSERT PORT AGE GREATER THAN 5 YRS  04/23/2022    IR INSERT PORT AGE GREATER THAN 5 YRS 04/23/2022 Courtney Harris Evron, PA IMG VIR [facility]    PR BRNCHSC EBUS GUIDED SAMPL 1/2 NODE STATION/STRUX N/A 05/04/2024    Procedure: BRONCH, RIGID OR FLEXIBLE, INC FLUORO GUIDANCE, WHEN PERFORMED; WITH EBUS GUIDED TRANSTRACHEAL AND/OR TRANSBRONCHIAL SAMPLING, ONE OR TWO MEDIASTINAL AND/OR HILAR LYMPH NODE STATIONS OR STRUCTURES;  Surgeon: Fontaine Selinda Beam, MD;  Location: [facility];  Service: Pulmonary    PR INTRAOPERATIVE SENTINEL LYMPH NODE ID W DYE INJECTION Left 11/27/2022    Procedure: INTRAOPERATIVE IDENTIFICATION SENTINEL LYMPH NODE(S) INCLUDE INJECTION NON-RADIOACTIVE DYE, WHEN PERFORMED;  Surgeon: Iva Cay Murray, DO;  Location: [facility];  Service: Surgical Oncology Breast    PR MASTECTOMY, PARTIAL Left 11/27/2022    Procedure: MASTECTOMY, PARTIAL (EG, LUMPECTOMY, TYLECTOMY, QUADRANTECTOMY, SEGMENTECTOMY);  Surgeon: Gallagher, Kristalyn Kay, DO;  Location: OR [facility];  Service: Surgical Oncology Breast    PR REMOVE ARMPITS LYMPH NODES COMPLT Left 11/27/2022    Procedure: AXILLARY LYMPHADENECTOMY; COMPLETE;  Surgeon: Iva Cay Murray, DO;  Location: [facility];  Service: Surgical Oncology Breast    RADIATION         Medications    Current Outpatient Medications:     albuterol  HFA 90 mcg/actuation inhaler, Inhale 2 puffs every six (6) hours as needed for wheezing., Disp: 8 g, Rfl: 2    aspirin 325 MG tablet, Take 1 tablet (325 mg total) by mouth daily. (Patient not taking: Reported on 06/22/2024), Disp: , Rfl:     dexAMETHasone  (DECADRON ) 4 MG tablet, Take 2 tablets (8 mg total) by mouth daily. Take every morning on Days 2, 3, and 4 and 9, 10 and 11., Disp: 60 tablet, Rfl: 1    hydrocortisone  (ANUSOL -HC) 2.5 % rectal cream, Insert into the rectum two (2) times a day., Disp: 30 g, Rfl: 5    levETIRAcetam  (KEPPRA ) 500 MG tablet, Take 1 tablet (500 mg total) by mouth two (2) times a day., Disp: 180 tablet, Rfl: 3    loperamide  (IMODIUM ) 2 mg capsule, Take 2 capsules to start, then 1 capsule every 2 hours until diarrhea free for 12 hours., Disp: 60 capsule, Rfl: prn    omeprazole  (PRILOSEC) 40 MG capsule, Take 1 capsule (40 mg total) by mouth daily., Disp: 30 capsule, Rfl: 3    ondansetron  (ZOFRAN ) 8 MG tablet, Take 1 tablet (8 mg total) by mouth every eight (8) hours as needed for nausea., Disp: 30 tablet, Rfl: 2    ondansetron  (ZOFRAN ) 8 MG tablet, Take 1 tablet (8 mg total) by mouth two (2) times a day. Take on Days 2, 3, and 4 and 9, 10 and 11; And then, 8 mg every 8 hours as needed., Disp: 30 tablet, Rfl: 5    pegfilgrastim -apgf (NYVEPRIA ) 6 mg/0.6 mL injection, Inject 0.6 mL (6 mg total) under the skin every 21 days. Administer on day 9 of every 21 day cycle. (Chemo is D1, 8 every 21 days), Disp: 0.6 mL, Rfl: 5    prochlorperazine  (COMPAZINE ) 10 MG tablet, Take 1 tablet (10 mg total) by mouth every six (6) hours as needed (nausea)., Disp: 30 tablet, Rfl: 5    Allergies  No Known Allergies    Personal and Social History  Social History     Social History Narrative  The patient is committed relationship.  She works as Scientist, research (medical). She has four children.   Lives in [place], [place] with her long-term boyfriend, Burnetta, and two of her children. She has 4 children and now 5 grand children aged 86mo-69yrs. 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 Loves spending time at [employer]. Has been to the beach around 5 times the past year including [address], [address]     Smoking 1ppd x 10-15 years. Has quit before.  MJ smoking 1-4 joints per day  No cocaine, heroin, no IVDU  EtOH socially 2-3x weekly      Family History  Family History   Problem Relation Age of Onset    Diabetes Mother     Hypertension Mother     Parkinsonism Father     No Known Problems Sister     No Known Problems Daughter     No Known Problems Maternal Grandmother     No Known Problems Maternal Grandfather     Ovarian cancer Paternal Grandmother 80    Cancer Paternal Uncle         colon or prostate ?age    Breast cancer Paternal Cousin 48        recent dx, limited cntact    Breast cancer Other         3x paternal great aunts (PGF sisters) with breast ca ?ages; there may be other more distant paternal cousins w/ bresat ca    BRCA 1/2 Neg Hx     Colon cancer Neg Hx     Endometrial cancer Neg Hx    Parents still alive. Mother with DM. Father with parkinsons.   Uncle (paternal) cancer  Uncle (paternal) prostate  MGF colon cancer  Paternal aunt with breast cancer      Review of Systems: A 12-system review of systems was obtained including: Constitutional, Eyes, ENT, Cardiovascular, Respiratory, GI, GU, Musculoskeletal, Skin, Neurological, Psychiatric, Endocrine, Heme/Lymphatic, and Allergic/Immunologic systems. It is negative or non-contributory to the patient???s management except for the following: See Interval History.    Physical Examination:  Vital Signs: BP 141/94  - Pulse 116  - Temp 36.7 ??C (98.1 ??F) (Oral)  - Resp 18  - Wt (!) 104.7 kg (230 lb 13.2 oz)  - SpO2 100%  - BMI 39.62 kg/m??   General:  Healthy-appearing female in no acute distress.  Cardiovasc:  No heaves, regular, no additional sounds. No lower extremity edema.    Respiratory:  Chest clear to percussion and auscultation, unlabored. Implanted port located in upper right chest, site clean, dry no swelling or erythema.  Gastrointestinal:  Soft, nontender, no hepatomegaly.   Musculoskeletal:  No bony pain or tenderness along thoracic or lower spine  Extremities: 3+ pitting edema to bilateral ankles  Skin and Subcutaneous Tissues:  Negative. No rash, ecchymoses, purpuric lesions.  Psychiatric: Mood is normal. Appropriately anxious.  No other symptoms.   Neuro:  Alert and oriented.  Gait and coordination normal  Upper Extremity Lymphedema: None.   Breasts:  Left breast with radiation changes, well healed lumpectomy scar. Palpable 2cm nodule in the lateral left breast cannot appreciate on exam today.  Right breast no palpable mass or nodules.   Right axilla small palpable nodule consistent with treated abscess, no inflammation or erythema.     I have personally reviewed the following diagnostic studies:  Lab Results   Component Value Date    WBC 3.2 (L) 07/27/2024    HGB 9.7 (  L) 07/27/2024    HCT 30.1 (L) 07/27/2024    PLT 205 07/27/2024    CHOL 181 06/03/2021    TRIG 82 06/03/2021    HDL 43 06/03/2021    ALT 61 (H) 07/27/2024    AST 15 07/27/2024    NA 145 07/27/2024    K 3.6 07/27/2024    CL 102 07/27/2024    CREATININE 0.53 (L) 07/27/2024    BUN 13 07/27/2024    CO2 30.0 07/27/2024    TSH 1.487 04/20/2024    INR 0.85 11/20/2023

## 2024-08-08 DIAGNOSIS — C7931 Secondary malignant neoplasm of brain: Principal | ICD-10-CM

## 2024-08-08 DIAGNOSIS — C50912 Malignant neoplasm of unspecified site of left female breast: Principal | ICD-10-CM

## 2024-08-08 DIAGNOSIS — Z170 Estrogen receptor positive status [ER+]: Principal | ICD-10-CM

## 2024-08-09 ENCOUNTER — Ambulatory Visit
Admit: 2024-08-09 | Discharge: 2024-08-10 | Payer: Medicare (Managed Care) | Attending: Student in an Organized Health Care Education/Training Program | Primary: Student in an Organized Health Care Education/Training Program

## 2024-08-09 DIAGNOSIS — C50912 Malignant neoplasm of unspecified site of left female breast: Principal | ICD-10-CM

## 2024-08-09 DIAGNOSIS — Z170 Estrogen receptor positive status [ER+]: Principal | ICD-10-CM

## 2024-08-09 DIAGNOSIS — C7931 Secondary malignant neoplasm of brain: Principal | ICD-10-CM

## 2024-08-09 NOTE — Unmapped (Signed)
Pt oriented to room and call bell. All procedures and care while in clinic explained and reviewed prior to implementation.  Pt encouraged to voice fears and concerns. Patient denies any questions or concerns at this time. Will continue educating as needed throughout patient's visit.   Time spent 10 minutes.

## 2024-08-10 DIAGNOSIS — Z170 Estrogen receptor positive status [ER+]: Principal | ICD-10-CM

## 2024-08-10 DIAGNOSIS — C50912 Malignant neoplasm of unspecified site of left female breast: Principal | ICD-10-CM

## 2024-08-10 DIAGNOSIS — C7931 Secondary malignant neoplasm of brain: Principal | ICD-10-CM

## 2024-08-10 NOTE — Unmapped (Signed)
 I spoke with patient Erin Gordon to confirm appointments on the following date(s): 9/26    Josiephine Finder, CNA

## 2024-08-11 DIAGNOSIS — C7931 Secondary malignant neoplasm of brain: Principal | ICD-10-CM

## 2024-08-11 DIAGNOSIS — C50912 Malignant neoplasm of unspecified site of left female breast: Principal | ICD-10-CM

## 2024-08-11 DIAGNOSIS — Z170 Estrogen receptor positive status [ER+]: Principal | ICD-10-CM

## 2024-08-11 NOTE — Unmapped (Signed)
 Pharmacist Chemotherapy First Cycle Counseling    Medication: fam-trastuzumab deruxtecan    Medication Education     Erin Gordon is a 48 y.o. female with HER2-low metastatic breast cancer who I have counseled prior to the initiation of  fam-trastuzumab deruxtecan-nxki (Enhertu).      Chemotherapy agent, administration, and schedule were reviewed with the patient.  Side effects discussed included but were not limited to: infusion-related reactions, pulmonary toxicity, rash, fatigue, alopecia, N/V/D/C, complications of myelosuppression, hepatotoxicity, and cardiotoxicity.  Patient instructed to contact the team if she develops a new cough and SOB.    Anti-emetic regimen reviewed:  1. Dexamethasone  8 mg po qam days 2 and 3 after chemo  2. Ondansetron  8 mg q8hr prn  3. Prochlorperazine  10 mg q6hr prn     Handout provided: Patient chemotherapy handout from [employer].     Patient verbalized understanding.  Approximate time spent on the phone with patient: 15 minutes

## 2024-08-12 DIAGNOSIS — C50912 Malignant neoplasm of unspecified site of left female breast: Principal | ICD-10-CM

## 2024-08-12 DIAGNOSIS — Z170 Estrogen receptor positive status [ER+]: Principal | ICD-10-CM

## 2024-08-12 DIAGNOSIS — C7931 Secondary malignant neoplasm of brain: Principal | ICD-10-CM

## 2024-08-19 ENCOUNTER — Ambulatory Visit: Admit: 2024-08-19 | Discharge: 2024-08-19 | Payer: Medicare (Managed Care)

## 2024-08-19 ENCOUNTER — Other Ambulatory Visit: Admit: 2024-08-19 | Discharge: 2024-08-19 | Payer: Medicare (Managed Care)

## 2024-08-19 ENCOUNTER — Inpatient Hospital Stay: Admit: 2024-08-19 | Discharge: 2024-08-19 | Payer: Medicare (Managed Care)

## 2024-08-19 DIAGNOSIS — K219 Gastro-esophageal reflux disease without esophagitis: Principal | ICD-10-CM

## 2024-08-19 DIAGNOSIS — Z170 Estrogen receptor positive status [ER+]: Principal | ICD-10-CM

## 2024-08-19 DIAGNOSIS — C50912 Malignant neoplasm of unspecified site of left female breast: Principal | ICD-10-CM

## 2024-08-19 DIAGNOSIS — C7931 Secondary malignant neoplasm of brain: Principal | ICD-10-CM

## 2024-08-19 DIAGNOSIS — M5489 Other dorsalgia: Principal | ICD-10-CM

## 2024-08-19 LAB — CBC W/ AUTO DIFF
BASOPHILS ABSOLUTE COUNT: 0 10*9/L (ref 0.0–0.1)
BASOPHILS RELATIVE PERCENT: 0.4 %
EOSINOPHILS ABSOLUTE COUNT: 0 10*9/L (ref 0.0–0.5)
EOSINOPHILS RELATIVE PERCENT: 0 %
HEMATOCRIT: 31.3 % — ABNORMAL LOW (ref 34.0–44.0)
HEMOGLOBIN: 10.1 g/dL — ABNORMAL LOW (ref 11.3–14.9)
LYMPHOCYTES ABSOLUTE COUNT: 1.8 10*9/L (ref 1.1–3.6)
LYMPHOCYTES RELATIVE PERCENT: 27.6 %
MEAN CORPUSCULAR HEMOGLOBIN CONC: 32.1 g/dL (ref 32.0–36.0)
MEAN CORPUSCULAR HEMOGLOBIN: 24.8 pg — ABNORMAL LOW (ref 25.9–32.4)
MEAN CORPUSCULAR VOLUME: 77.3 fL — ABNORMAL LOW (ref 77.6–95.7)
MEAN PLATELET VOLUME: 7.5 fL (ref 6.8–10.7)
MONOCYTES ABSOLUTE COUNT: 0.5 10*9/L (ref 0.3–0.8)
MONOCYTES RELATIVE PERCENT: 7.7 %
NEUTROPHILS ABSOLUTE COUNT: 4.1 10*9/L (ref 1.8–7.8)
NEUTROPHILS RELATIVE PERCENT: 64.3 %
PLATELET COUNT: 253 10*9/L (ref 150–450)
RED BLOOD CELL COUNT: 4.05 10*12/L (ref 3.95–5.13)
RED CELL DISTRIBUTION WIDTH: 23.3 % — ABNORMAL HIGH (ref 12.2–15.2)
WBC ADJUSTED: 6.4 10*9/L (ref 3.6–11.2)

## 2024-08-19 LAB — COMPREHENSIVE METABOLIC PANEL
ALBUMIN: 3.1 g/dL — ABNORMAL LOW (ref 3.4–5.0)
ALKALINE PHOSPHATASE: 77 U/L (ref 46–116)
ALT (SGPT): 69 U/L — ABNORMAL HIGH (ref 10–49)
ANION GAP: 15 mmol/L — ABNORMAL HIGH (ref 5–14)
AST (SGOT): 19 U/L (ref <=34)
BILIRUBIN TOTAL: 0.2 mg/dL — ABNORMAL LOW (ref 0.3–1.2)
BLOOD UREA NITROGEN: 14 mg/dL (ref 9–23)
BUN / CREAT RATIO: 23
CALCIUM: 9.3 mg/dL (ref 8.7–10.4)
CHLORIDE: 103 mmol/L (ref 98–107)
CO2: 26 mmol/L (ref 20.0–31.0)
CREATININE: 0.61 mg/dL (ref 0.55–1.02)
EGFR CKD-EPI (2021) FEMALE: >90 mL/min/1.73m2 (ref >=60)
GLUCOSE RANDOM: 146 mg/dL (ref 70–179)
POTASSIUM: 3.5 mmol/L (ref 3.4–4.8)
PROTEIN TOTAL: 7.3 g/dL (ref 5.7–8.2)
SODIUM: 144 mmol/L (ref 135–145)

## 2024-08-19 LAB — SLIDE REVIEW

## 2024-08-19 MED ORDER — ONDANSETRON HCL 8 MG TABLET
ORAL_TABLET | Freq: Three times a day (TID) | ORAL | 2 refills | 10.00000 days | Status: CP | PRN
Start: 2024-08-19 — End: ?
  Filled 2024-08-19: qty 30, 10d supply, fill #0

## 2024-08-19 MED ORDER — PROCHLORPERAZINE MALEATE 10 MG TABLET
ORAL_TABLET | Freq: Four times a day (QID) | ORAL | 2 refills | 8.00000 days | Status: CP | PRN
Start: 2024-08-19 — End: ?
  Filled 2024-08-19: qty 30, 8d supply, fill #0

## 2024-08-19 MED ORDER — LIDOCAINE 5 % TOPICAL PATCH
MEDICATED_PATCH | TRANSDERMAL | 1 refills | 30.00000 days | Status: CP
Start: 2024-08-19 — End: 2024-08-19

## 2024-08-19 MED ORDER — OMEPRAZOLE 40 MG CAPSULE,DELAYED RELEASE
ORAL_CAPSULE | Freq: Every day | ORAL | 3 refills | 30.00000 days | Status: CP
Start: 2024-08-19 — End: 2025-08-19
  Filled 2024-08-19: qty 30, 30d supply, fill #0

## 2024-08-19 MED ORDER — DEXAMETHASONE 4 MG TABLET
ORAL_TABLET | Freq: Every day | ORAL | 3 refills | 6.00000 days | Status: CP
Start: 2024-08-19 — End: ?
  Filled 2024-08-19: qty 12, 63d supply, fill #0

## 2024-08-19 MED ORDER — LIDOCAINE 4 % TOPICAL PATCH
MEDICATED_PATCH | TOPICAL | 3 refills | 0.00000 days | Status: CP
Start: 2024-08-19 — End: ?
  Filled 2024-08-19: qty 10, 5d supply, fill #0

## 2024-08-19 MED ADMIN — dexAMETHasone (DECADRON) tablet 12 mg: 12 mg | ORAL | @ 17:00:00 | Stop: 2024-08-19

## 2024-08-19 MED ADMIN — zoledronic acid (ZOMETA) 4 mg in sodium chloride (NS) 0.9 % 100 mL IVPB: 4 mg | INTRAVENOUS | @ 20:00:00 | Stop: 2024-08-19

## 2024-08-19 MED ADMIN — heparin, porcine (PF) 100 unit/mL injection 500 Units: 500 [IU] | INTRAVENOUS | @ 21:00:00 | Stop: 2024-08-20

## 2024-08-19 MED ADMIN — fosaprepitant (EMEND) 150 mg in sodium chloride 0.9 % 100 mL IVPB: 150 mg | INTRAVENOUS | @ 18:00:00 | Stop: 2024-08-19

## 2024-08-19 MED ADMIN — fam-trastuzumab deruxtecan-nxki (ENHERTU) 566 mg in dextrose 5 % 100 mL IVPB: 5.4 mg/kg | INTRAVENOUS | @ 19:00:00 | Stop: 2024-08-19

## 2024-08-19 MED ADMIN — ondansetron (ZOFRAN) tablet 24 mg: 24 mg | ORAL | @ 17:00:00 | Stop: 2024-08-19

## 2024-08-19 NOTE — Unmapped (Signed)
 RED ZONE Means: RED ZONE: Take action now!     You need to be seen right away  Symptoms are at a severe level of discomfort    Call 911 or go to your nearest  Hospital for help     - Bleeding that will not stop    - Hard to breathe    - New seizure - Chest pain  - Fall or passing out  -Thoughts of hurting    yourself or others      Call 911 if you are going into the RED ZONE                  YELLOW ZONE Means:     Please call with any new or worsening symptom(s), even if not on this list.  Call [phone]  After hours, weekends, and holidays - you will reach a long recording with specific instructions, If not in an emergency such as above, please listen closely all the way to the end and choose the option that relates to your need.   You can be seen by a provider the same day through our Same Day Acute Care for Patients with Cancer program.      YELLOW ZONE: Take action today     Symptoms are new or worsening  You are not within your goal range for:    - Pain    - Shortness of breath    - Bleeding (nose, urine, stool, wound)    - Feeling sick to your stomach and throwing up    - Mouth sores/pain in your mouth or throat    - Hard stool or very loose stools (increase in       ostomy output)    - No urine for 12 hours    - Feeding tube or other catheter/tube issue    - Redness or pain at previous IV or port/catheter site    - Depressed or anxiety   - Swelling (leg, arm, abdomen,     face, neck)  - Skin rash or skin changes  - Wound issues (redness, drainage,    re-opened)  - Confusion  - Vision changes  - Fever >100.4 F or chills  - Worsening cough with mucus that is    green, yellow, or bloody  - Pain or burning when going to the    bathroom  - Home Infusion Pump Issue- call    [phone]         Call your healthcare provider if you are going into the YELLOW ZONE     GREEN ZONE Means:  Your symptoms are under controls  Continue to take your medicine as ordered  Keep all visits to the provider GREEN ZONE: You are in control  No increase or worsening symptoms  Able to take your medicine  Able to drink and eat    - DO NOT use MyChart messages to report red or yellow symptoms. Allow up to 3    business days for a reply.  -MyChart is for non-urgent medication refills, scheduling requests, or other general questions.         [id number] Rev. 05/23/2022  Approved by Oncology Patient Education Committee     Lab on 08/19/2024   Component Date Value Ref Range Status    Sodium 08/19/2024 144  135 - 145 mmol/L Final    Potassium 08/19/2024 3.5  3.4 - 4.8 mmol/L Final    Chloride 08/19/2024 103  98 - 107 mmol/L Final  CO2 08/19/2024 26.0  20.0 - 31.0 mmol/L Final    Anion Gap 08/19/2024 15 (H)  5 - 14 mmol/L Final    BUN 08/19/2024 14  9 - 23 mg/dL Final    Creatinine 90/73/7974 0.61  0.55 - 1.02 mg/dL Final    BUN/Creatinine Ratio 08/19/2024 23   Final    eGFR CKD-EPI (2021) Female 08/19/2024 >90  >=60 mL/min/1.81m2 Final    eGFR calculated with CKD-EPI 2021 equation in accordance with [employer] and [employer] of Nephrology Task Force recommendations.    Glucose 08/19/2024 146  70 - 179 mg/dL Final    Calcium 90/73/7974 9.3  8.7 - 10.4 mg/dL Final    Albumin 90/73/7974 3.1 (L)  3.4 - 5.0 g/dL Final    Total Protein 08/19/2024 7.3  5.7 - 8.2 g/dL Final    Total Bilirubin 08/19/2024 0.2 (L)  0.3 - 1.2 mg/dL Final    AST 90/73/7974 19  <=34 U/L Final    ALT 08/19/2024 69 (H)  10 - 49 U/L Final    Alkaline Phosphatase 08/19/2024 77  46 - 116 U/L Final    WBC 08/19/2024 6.4  3.6 - 11.2 10*9/L Final    RBC 08/19/2024 4.05  3.95 - 5.13 10*12/L Final    HGB 08/19/2024 10.1 (L)  11.3 - 14.9 g/dL Final    HCT 90/73/7974 31.3 (L)  34.0 - 44.0 % Final    MCV 08/19/2024 77.3 (L)  77.6 - 95.7 fL Final    MCH 08/19/2024 24.8 (L)  25.9 - 32.4 pg Final    MCHC 08/19/2024 32.1  32.0 - 36.0 g/dL Final    RDW 90/73/7974 23.3 (H)  12.2 - 15.2 % Final    MPV 08/19/2024 7.5  6.8 - 10.7 fL Final    Platelet 08/19/2024 253  150 - 450 10*9/L Final    Neutrophils % 08/19/2024 64.3  % Final    Lymphocytes % 08/19/2024 27.6  % Final    Monocytes % 08/19/2024 7.7  % Final    Eosinophils % 08/19/2024 0.0  % Final    Basophils % 08/19/2024 0.4  % Final    Absolute Neutrophils 08/19/2024 4.1  1.8 - 7.8 10*9/L Final    Absolute Lymphocytes 08/19/2024 1.8  1.1 - 3.6 10*9/L Final    Absolute Monocytes 08/19/2024 0.5  0.3 - 0.8 10*9/L Final    Absolute Eosinophils 08/19/2024 0.0  0.0 - 0.5 10*9/L Final    Absolute Basophils 08/19/2024 0.0  0.0 - 0.1 10*9/L Final    Microcytosis 08/19/2024 Slight (A)  Not Present Final    Anisocytosis 08/19/2024 Marked (A)  Not Present Final    Hypochromasia 08/19/2024 Marked (A)  Not Present Final    Smear Review Comments 08/19/2024 See Comment  Undefined Final    Irregularly contracted RBCs present.  [id number]    Ovalocytes 08/19/2024 Moderate (A)  Not Present Final    Poikilocytosis 08/19/2024 Moderate (A)  Not Present Final

## 2024-08-19 NOTE — Unmapped (Signed)
 Informed Consent Documentation  Study: [id number] [id number] (POEM)     I met with patient Erin Gordon to discuss consent for the POEM Study. The protocol was reviewed including discussion of risks & benefits, confidentiality, time commitments involved, study contact list, and the option to withdraw at any time. Alternatives to study participation were discussed. The patient was given reasonable time to consider participation in the study, in the absence of coercion or undue influence. Patient was offered an opportunity for questions and these questions were answered. Patient verbalized understanding of information presented.      The patient has signed the study informed consent and HIPAA Authorization Form as follows:   X     In person    Date: 08/19/2024    Signed and dated copies of all consent documents were given to the patient. Every effort to maintain confidentiality will be employed.    Consent Form Version Date: 04/26/2024  Approved version date by IRB: 06/09/2024     Plan: Patient agreed to take part in 4 survey's that occur over 12 months. The first survey will be the baseline (done at enrollment), then 3,6, and 12 month intervals.     Inclusion Criteria  Written informed consent obtained to participate in the study and HIPAA authorization for release of personal health information: (Yes)  Subjects is willing and able to comply with study procedures: (Yes)  Female and Female patients age > 18 years: (Yes, DOB: )  Pathologic diagnosis of metastatic breast cancer prior to enrollment date: (Yes, Diagnosis date: 11/02/2023)  Able to answer surveys in [place] (the patient's first language is not required to be [place]): (Yes, per demographics page)  Indicate intent to receive ongoing cancer care at the enrolling institution: (Yes, next apt date:08/29/2024)    Exclusion Criteria  Patient unwilling or unable to complete surveys via one of the following methods: (a) paper survey completed in clinic or mailed directly to patient's home address, including a pre-addressed, pre-stamped return envelope in the mailed survey packet, or (b) electronic survey links sent via emails or text link on a mobile device, tablet, laptop, or desktop computer: (No)  Patient unwilling or unable to provide verbal or signed consent to participate: (No)  Subject cannot read English: (No)  Patient is receiving home or residential hospice care, is hospitalized, or is described as likely to die within 90 days or actively dying in last provider documentation, at the time of enrollment. Provider confirmation of life expectancy is not required for enrollment: (No)

## 2024-08-19 NOTE — Unmapped (Signed)
 Study: TBCRC-066 - Quantitative Immunofluorescence and/or RT-qPCR for Measuring HER2 in HER2-low Metastatic Breast Cancer      I met with patient Erin Gordon to discuss consent for the TBCRC-066 trial. The protocol was reviewed including discussion of risks & benefits, confidentiality, time commitments involved, study contact list, and the option to withdraw at any time. Alternatives to study participation were discussed. The patient was given reasonable time to consider participation in the study, in the absence of coercion or undue influence. Patient was offered an opportunity for questions and these questions were answered. Patient verbalized understanding of information presented.      The patient has signed the study informed consent and HIPAA Authorization Form as follows:   X     In my presence  Date: __9/26/25_______    Signed and dated copies of all consent documents were given to the patient. Every effort to maintain confidentiality will be employed.    Consent version date:_10/23/2024____          Plan: Patients who are eligible to take part in the QuantifyHER study, will be asked to allow the researchers to request a part of the tumor tissue sample that was removed at the time of their biopsy confirming metastatic breast cancer. They may also request part of previous tumor tissue samples if they showed HER2-low (IHC<2+) disease. If a tumor sample is not available, they will not be eligible to take part in this study. The patient will be asked to undergo routine imaging tests at normal time points, the timing of which will be directed by their treating physician. The results of these studies will be shared with the study team.      Inclusion Criteria  [x]  Women and men age > 18 years   Yes- patient is 48 years old.    [x]  Metastatic breast cancer, histologically- confirmed. Any estrogen receptor (ER) status is allowed. ER status will be determined by local laboratory assessment utilizing ASCO/CAP guidelines.   Yes-   - Clinical stage from 03/11/2022: Stage IIIB (cT2, cN2(f), cM0, G3, ER+, PR-, HER2-) - Signed by Gallagher, Kristalyn Kay, DO on 03/12/2022  - Pathologic stage from 12/11/2022: No Stage Recommended (ypT3, pN2a, G3, ER+, PR-, HER2-) - Signed by Linnie Pierce Hummer, ANP on 12/11/2022  - Pathologic stage from 10/28/2023: Stage IV (rpT3, pN2, cM1, GX, ER-, PR-, HER2-) - Signed by Zettie Reagan Bare, MD on 11/11/2023    [x]  Primary and/or metastatic tumor that is HER2-low (IHC <2+) by immunohistochemistry (IHC) as determined by local laboratory assessment utilizing ASCO/CAP guidelines. Patients who have had additional biopsies that were HER2 2+/Non-amplified (FISH negative) are still eligible so long as a HER2-low (IHC < 2+) specimen is available.   Yes- From surg path collection on 11/27/22:    Estrogen receptor: Low positive (2%, weak intensity)                Progesterone receptor: Negative                HER2 IHC: Negative (1+)     [x]  Measurable disease by cross-sectional imaging at the start of treatment. Patients with measurable bone-only disease or active brain metastases are eligible.  Yes- confirmed per treating physician and confirmed via patients chart.    [x]  Archival tissue available for biomarker assessment. The most recent metastatic biopsy that is HER2-low (IHC <2+) and was the basis for T-DXd initiation is preferred. However, if HER2-low (IHC <2+) status was determined on a specimen other than  the most recent (either primary or metastatic tissue), that specimen should be sent. Only one specimen should be sent, and it should be the specimen with IHC < 2+ that is closest in time to the beginning of T-DXd administration. Samples obtained from bone metastases that were processed via decalcification methods are not eligible.   Yes- tissue is available for analysis.    [x]  Intention to initiate therapy with T-DXd (Enhertu) at FDA-approved dose and schedule as next line of therapy. If T-DXd was already initiated, patients must be registered within 30 days of initiation.   Yes- patient is starting TDxD today, 08/19/24.    [x]  Ability to provide informed consent.  Yes- confirmed per treating physician.   Exclusion Criteria  []  HER2-overexpressing breast cancer (as confirmed by a biopsy of metastatic site or primary with IHC 3+ or IHC 2+/FISH amplified as per standard ASCO/CAP guidelines)   No- patient is Her2-low (1+).    Inclusion of Underrepresented Populations:  Individuals of all races and ethnic groups are eligible for this trial. There is no bias towards age or race in the clinical trial outlined. This trial is open to the accrual of both men and women.

## 2024-08-19 NOTE — Unmapped (Signed)
 Patient arrived to chair 58 for C1 D1 infusion. Port was flushed, + BR.  Labs within parameter for tx today. Premeds given. Patient completed and tolerated tx well. Port was flushed, +BR, heparin -locked, and de-accessed. Patient discharged with no additional needs, AVS declined.

## 2024-08-19 NOTE — Unmapped (Signed)
 Breast Oncology Return Patient Evaluation  Referring Physician: Myer Reyes BIRCH, Md  [address]  [place],  [place] [address].  PCP: [facility]    Cancer Team  Surgical Oncology: Kristalyn Gallagher, DO  Surgical Oncology NP: Izetta McDonough, NP and Evalene Pan, NP  Radiation Oncology: Lonell Duncan, MD  Medical Oncology: Reagan Cloud, MD  Plastic Surgery: None at this time  Genetics: None at this time Blood drawn today.    Reason for Visit: A 48 y.o. female with breast cancer referred for consultation for recommendations concerning the management of breast cancer.     Cancer stage   Cancer Staging   Malignant neoplasm of left breast in female, estrogen receptor positive    (CMS-HCC)  Staging form: Breast, AJCC 8th Edition  - Clinical stage from 03/11/2022: Stage IIIB (cT2, cN2(f), cM0, G3, ER+, PR-, HER2-) - Signed by Gallagher, Kristalyn Kay, DO on 03/12/2022  - Pathologic stage from 12/11/2022: No Stage Recommended (ypT3, pN2a, G3, ER+, PR-, HER2-) - Signed by Nicasio Pierce Hummer, ANP on 12/11/2022  - Pathologic stage from 10/28/2023: Stage IV (rpT3, pN2, cM1, GX, ER-, PR-, HER2-) - Signed by Cloud Reagan Bare, MD on 11/11/2023      [name] is a 48 year old woman with metastatic breast cancer, here for chemotherapy.    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 She was randomized to no upfront RT boost.  She has had multiple delays in her chemotherapy treatment for various reasons. Including infection and cytopenias. She completed last dose on 10/27/2022.     She is s/p left breast lumpectomy and axillary LN dissection of axillary staging on 11/27/2022 with 9 of 28 lymph nodes positive for macro metastatic carcinoma with the size of the largest tumor deposit 33 mm, treatment effect identified in multiple lymph nodes, extensive lymphovascular space invasion, and extranodal extension present less than 2 mm.  Residual left breast invasive ductal carcinoma with tumor size 64 mm in greatest dimension ER+(2%), PR -, HER2- (IHC 1+). She has Residual Cancer Burden Class: RCB-III     Adjuvant systemic therapy:  -Previously discussed option of enrolling in the ASCENT-05/OptimICE-RD (AFT-65): Phase 3, randomized, open-label study of adjuvant sacituzumab govitecan  + pembrolizumab  (pembro) vs pembro ?? capecitabine  in patients with triple-negative breast cancer and residual disease. After meeting with study coordinator she declined.   -Proceeded with SOC using Capecitabine  while continuing Pembrolizumab  every 6 weeks (last cycle on 11/26/2022)    Recurrent TNBC Metastatic breast cancer   1st line-BRE18-360 Phase I/II Study included Olapraib and Durvalumab  2nd line-Sacituzumab   3rd line- Enhertu    She was completing her last cycle of capecitabine  when she unfortunately presented with seizure like symptoms (Facial twitching-10/2023). Brain MRI showed 2 enhancing intracranial masses measuring up to 1.8cm in the right frontal lobe and 1.2cm in the posterior right parietal lobe with surrounding vasogenic edema. We obtained restaging at the time of discovering CNS progression; with no radiographic evidence of systemic disease. She enrolled in the BRE18-360 Phase I/II Study of Stereotactic Radiosurgery with Concurrent Administration of DNA Damage Response (DDR) Inhibitor (Olaparib ) Followed by Adjuvant Combination of Durvalumab ([id number]) and Physician s Choice Systemic Therapy in Subjects with Breast Cancer Brain Metastases. Patient consented and started treatment 11/20/23. Olaparib  chosen for systemic therapy  given absence of systemic disease at the time.   She completed Cyber knife to the brain on 12/03/23. She remained on study for approximately 6 months before noted progression.   -May 2025 POD new pulmonary nodules with mediastinal and bilateral hilar lymphadenopathy.There was also indeterminate foci along the hepatic dome. We discussed obtaining biopsy of distant site of disease since she initially presented with no extracranial disease. EBUS and mediastinal LN FNA was completed on 05/04/2024 confirming metastatic TNBC. Sample sent for tempus testing.We proceeded with [facility] sacituzumab govitecan  at this time    -August 2025. She is s/p C4 she is tolerating okay; although she reports significant fatigue a few days following treatment each week. She is concern about the dosing and inquires if a dose reduction is possible. She describes grade 2/3 fatigue interfering with daily activities. We discussed DR in effort to improve tolerance. Of note She did have tx hold last cycle due to neutropenia, we have add GSCF support Day 10 patient self injects.      S/p Sacituzumab 7.5 mg/kg Days 1,8 every 21 days ( DR starting C4)  and Pegfilgrastim  6 mg on day 10    -September 2025. She had progressive disease with new bone lesions on 08/02/24 and a pathological fracture.Reports weight gain and new lower extremity swelling while on Sacituzumab. Voiced that she would like to proceed with standard of care treatment as opposed to clinical trials. We discussed starting Enhertu and after discussing general side effects, she agreed. We weill have our pharmacist reach out for formal chemo education. Her father had just passed away this weekend and is arranging the funeral; she would like to resume treatment in 2 weeks once she returns.    - 08/19/2024  C1D1 Enhertu with Zometa. Here to begin treatment. Labs are adequate for therapy. Will proceed with first cycle chemotherapy with current dose and schedule as planned. Reviewed antiemetic plan.    Clinical trials: [id number]. TRADE-DXD not eligible since previous Saci.  She is not eligibile for UARMR941 given brain mets.    New Bone mets to T9, T11, L1 and S1  Pathologic fracture of T11  -Seen on CT A/P and NM bone scan 08/02/24  -Reports sacral pain due to weight gain, add lidocaine  patch 12h on/12h off prn  -Start Zometa for bone strengthening 08/19/24  -CTL Spine MRI has been ordered    Brain Mets  -s/p Cyber knife to the brain on 12/03/23.  Recent MRI brain showed concern for radiation necrosis v possible progression of treated lesions.   -Repeat MRI brain on 06/20/24 showed decreased size of lesions and decreased vasogenic edema.   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 She was recently switched back to Keppra  per Neuro.   -She denies any recent twitching in jaw and eye.  -Continue follow up with neurology.     Genetics:   - Germline testing: STAT Panel plus ATM and CHEK2 sent to [facility] - Negative   -Tempus; PD-L1 expression neg, she has TPN53 mut. No other actionable mutations at this time.     Diarrhea  Diarrhea due to chemotherapy, causing hemorrhoidal bleeding. Managed with Imodium .  - Instruct to take two Imodium  pills at onset of diarrhea and one after each loose stool, not exceeding eight tablets per day. This has been helpful.    Hemorrhoids  Hemorrhoidal bleeding exacerbated by diarrhea. Possible gastroenterology referral  if bleeding persists.  - Monitor for recurrent bleeding.  - Consider referral to gastroenterology if bleeding persists.    Hidradenitis suppurativa  Recurrent axillary boils related to hidradenitis suppurativa. Treated with antibiotics. Dermatology referral planned for further management.  - Extend antibiotic course by five days since she will receive immunosuppressive drug.   - Referred to dermatology for further management.    Supportive Care  -Low ACTH, cortisol normal and patient overall asymptomatic - econsult to endocrinology. ACTH <5.0 and Cortisol level 2.6 she is currently on steroid so may have affected results. We have consulted Endocrinology.   She has not had this level repeated, we discussed having done locally to facilitate timing of collection early morning. She is agreeable.   - HTN: patient taking hydrochlorothiazide . BP normalized  -Tobacco use we discussed smoking cessation; she is interested. Referral placed.  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 Will refer to lymphedema clinic in future if needed. Previously sent a referral for breast sleeve with [facility] and see if insurance will cover.  -Coping she endorses difficulty coping with new metastatic breast cancer diagnosis. We disccussed CCSP, she was agreeable but has declined.    -Dry eyes with intermittent cloudiness; recommend  Refresh eye drops regularly. Will also Refer to ophthalmologist for comprehensive eye exam.   -Edema lower extremities and wt gain. Steroids may contribute to increased appetite and wt gain. We will reduce Day 2,3 dexamethasone  to 4 mg daily. Encourage sodium intake reduction to manage edema, dietary intake includes lots of processed foods and take out.     Goals of Care:   Ms. Stoffel wants to receive therapy and live a long life, enjoy her family and feel well.     Dental issues   -Possibly exacerbated by cancer treatment; significant dental issues, including tooth loss and gum irritation/gingivitis.   - Refer to a dentist for evaluation.    Followup:  --08/19/24 C1D1 Enhertu + Zometa   --09/09/24 clinic visit for consideration of C2D1 Enhertu  --MRI Brain and MRI CTL spine prior to next visit    --continue to monitor ALT      ---------------------------------------------------------------------------------------------------------------------------------------------    HPI: Erin Gordon is a 48 y.o. female who returns today for follow up and continuation of treatment for recently dx breast cancer .    History of Present Illness  Erin Gordon is a 48 year old female with metastatic breast cancer who presents for initiation of Enhertu therapy.    Metastatic breast cancer and chemotherapy effects  - Metastatic breast cancer with prior chemotherapy regimen of sacituzumab   - Last dose of sacituzumab was three weeks ago  - Initiating Enhertu therapy at this visit    Fatigue  - Low energy levels attributed to previous chemotherapy regimen    Appetite and weight changes  - Low appetite and altered taste sensation, but continues to eat  - Recent weight increase to 230 pounds from 215 pounds, attributed to steroid use causing increased appetite and fluid retention  - Significant fluid retention last week, resolved after frequent urination    Musculoskeletal pain  - Intermittent back pain occurring one to two days per week  - Pt associates pain with weight gain  - Pain managed with Tylenol  and ibuprofen; prefers to avoid stronger pain medications  - Spine MRI scheduled for further evaluation    Gastrointestinal symptoms  - Currently taking omeprazole   for reflux  - Experiencing some gas    Psychosocial stressors  - Recent loss of her father, causing emotional stress while managing cancer treatment  - Upcoming marriage tomorrow, contributing to emotional and logistical challenges    Performance status= ECOG 0    Breast Cancer History  Hematology/Oncology History Overview Note   2023: Left breast cT2 cN2 IDC, G3, weakly+2%/-/-     Malignant neoplasm of left breast in female, estrogen receptor positive    (CMS-HCC)   02/12/2022 -  Presenting Symptoms    Self palpated left breast mass    MMG/US : Left breast 6:00 5 CFN there is a 3.2 x 1.8 x 2.4 cm irregular cystic and solid mass. Echogenic foci within the mass likely correspond to pleomorphic calcifications seen on mammography.  Left breast 6:00 2 CFN there is a 1.0 x 0.5 x 0.7 cm hypoechoic circumscribed mass with internal vascularity.  Left axilla there is multiple abnormally enlarged lymph nodes, largest demonstrating a cortical thickness of 1.9 cm.  Right breast 9:00 retroareolar there is  a 0.6 x 0.4 x  0.5 cm irregular hypoechoic mass.     03/03/2022 Biopsy    Left breast core biopsy, 6:00 5 CFN: IDC, G3, ER+(2%), ER-, Her2-(1+).    Left breast core biopsy, 6:00 2 CFN: benign breast tissue with stromal fibrosis and apocrine cysts.    Left axilla core biopsy: IDC, G3, ER+(2%), ER-, Her2-(1+).    Right breast core biopsy, retroareolar: benign breast tissue with stromal fibrosis and apocrine cysts.     03/11/2022 Initial Diagnosis    Malignant neoplasm of left breast in female, estrogen receptor positive (CMS-HCC)     03/11/2022 -  Cancer Staged    Staging form: Breast, AJCC 8th Edition  - Clinical stage from 03/11/2022: Stage IIIB (cT2, cN2(f), cM0, G3, ER+, PR-, HER2-) - Signed by Cay Shawnee Shaggy, DO on 03/12/2022       03/12/2022 Tumor Board    MDC recs: Left breast cT2 N1 IDC, G3, ER+(2%), PR-, HER2-  Imaging review with 3.2 cm area bx proven IDC and multiple (>7) enlarged axillary LNs. Additional left breast bx benign and concordant. Right breast bx benign and concordant.  NACT given locally advanced, schedule with med/onc add on next week  Staging studies  Possible PRAD trial candidate  Genetics: Meets GT criteria based on dx br cancer <50yo. Stat breast panel + ATM + CHEK2 and econsult     03/25/2022 Interval Scan(s)      MRI bilateral breast completed revealed 3.7 cm transverse by 3.3 cm AP by 2.6 cm craniocaudal. It demonstrates central necrosis with peripheral nodular enhancement. There is also an oval mass measuring 9 mm in the left breast, this mass has not been biopsied. However biopsied was recommended since patient is considering breast conservation surgery post chemotherapy. Patient was scheduled for biopsy of the 9 mm mass, but ultimately declined biopsy due to claustrophobia.     04/23/2022 -  Chemotherapy    STUDY [id number]-053 IRB# [id number] NEOADJUVANT PEMBROLIZUMAB  +/- RT WITH PACLItaxel / CARBOplatin FOLLOWED BY ddAC (v. 05/16/21)  P-RAD: A Randomized Study of Preoperative Chemotherapy, Pembrolizumab  and No, Low or High Dose RADiation in Node-Positive, HER2-Negative Breast Cancer     12/11/2022 -  Cancer Staged    Staging form: Breast, AJCC 8th Edition  - Pathologic stage from 12/11/2022: No Stage Recommended (ypT3, pN2a, G3, ER+, PR-, HER2-) - Signed by Linnie Pierce Hummer, ANP on 12/11/2022       10/28/2023 -  Cancer Staged    Staging form: Breast, AJCC 8th Edition  - Pathologic stage from 10/28/2023: Stage IV (rpT3, pN2, cM1, GX, ER-, PR-, HER2-) - Signed by Zettie Reagan Bare, MD on 11/11/2023       Malignant neoplasm of left breast in female, estrogen receptor positive, unspecified site of breast    (CMS-HCC)   03/25/2022 Interval Scan(s)      MRI bilateral breast completed revealed 3.7 cm transverse by 3.3 cm AP by 2.6 cm craniocaudal. It demonstrates central necrosis with peripheral nodular enhancement. There is also an oval mass measuring 9 mm in the left breast, this mass has not been biopsied. However biopsied was recommended since patient is considering breast conservation surgery post chemotherapy. Patient was scheduled for biopsy of the 9 mm mass, but ultimately declined biopsy due to claustrophobia.     03/26/2022 Initial Diagnosis    Malignant neoplasm of left breast in female, estrogen receptor positive, unspecified site of breast (CMS-HCC)     03/26/2022 - 06/08/2022 Radiation    Radiation Therapy Treatment Details (03/26/2022 - 06/08/2022)  Site: Right Breast  Technique: IMRT  Goal: Curative  Planned Treatment Start Date: No planned start date specified     01/01/2023 -  Radiation    Radiation Therapy Treatment Details (Noted on 01/01/2023)  Site: Left Breast with lymph nodes  Technique: VMAT  Goal: No goal specified  Planned Treatment Start Date: No planned start date specified     Metastasis to brain    (CMS-HCC)   11/11/2023 Initial Diagnosis    Metastasis to brain (CMS-HCC)     11/11/2023 -  Radiation    Radiation Therapy Treatment Details (Noted on 11/11/2023)  Site: Right Brain  Technique: SRT  Goal: Control  Planned Treatment Start Date: No planned start date specified         Past Medical History  Past Medical History:   Diagnosis Date    Hypertension     Malignant neoplasm of left breast in female, estrogen receptor positive    (CMS-HCC) 03/11/2022     HTN - stopped taking hydrochlorothiazide . Goes to [facility].    GERD - taking PRN PPI  Anxiety - not treated        Reproductive/GYN History  OB History   Gravida Para Term Preterm AB Living   4 4 4  0 0 0   SAB IAB Ectopic Molar Multiple Live Births   0 0 0 0 0 0      # Outcome Date GA Lbr Len/2nd Weight Sex Type Anes PTL Lv   4 Term            3 Term            2 Term            1 Term                Surgical History  Past Surgical History:   Procedure Laterality Date    BREAST BIOPSY      BREAST LUMPECTOMY Left 11/27/2022    CHEMOTHERAPY      HYSTERECTOMY      IR INSERT PORT AGE GREATER THAN 5 YRS  04/23/2022    IR INSERT PORT AGE GREATER THAN 5 YRS 04/23/2022 Courtney Harris Evron, PA IMG VIR [facility]    PR BRNCHSC EBUS GUIDED SAMPL 1/2 NODE STATION/STRUX N/A 05/04/2024    Procedure: BRONCH, RIGID OR FLEXIBLE, INC FLUORO GUIDANCE, WHEN PERFORMED; WITH EBUS GUIDED TRANSTRACHEAL AND/OR TRANSBRONCHIAL SAMPLING,  ONE OR TWO MEDIASTINAL AND/OR HILAR LYMPH NODE STATIONS OR STRUCTURES;  Surgeon: Fontaine Selinda Beam, MD;  Location: OR [facility];  Service: Pulmonary    PR INTRAOPERATIVE SENTINEL LYMPH NODE ID W DYE INJECTION Left 11/27/2022    Procedure: INTRAOPERATIVE IDENTIFICATION SENTINEL LYMPH NODE(S) INCLUDE INJECTION NON-RADIOACTIVE DYE, WHEN PERFORMED;  Surgeon: Iva Cay Murray, DO;  Location: [facility];  Service: Surgical Oncology Breast    PR MASTECTOMY, PARTIAL Left 11/27/2022    Procedure: MASTECTOMY, PARTIAL (EG, LUMPECTOMY, TYLECTOMY, QUADRANTECTOMY, SEGMENTECTOMY);  Surgeon: Gallagher, Kristalyn Kay, DO;  Location: OR [facility];  Service: Surgical Oncology Breast    PR REMOVE ARMPITS LYMPH NODES COMPLT Left 11/27/2022    Procedure: AXILLARY LYMPHADENECTOMY; COMPLETE;  Surgeon: Iva Cay Murray, DO;  Location: [facility];  Service: Surgical Oncology Breast    RADIATION         Medications    Current Outpatient Medications:     albuterol  HFA 90 mcg/actuation inhaler, Inhale 2 puffs every six (6) hours as needed for wheezing., Disp: 8 g, Rfl: 2    aspirin 325 MG tablet, Take 1 tablet (325 mg total) by mouth daily. (Patient taking differently: Take 1 tablet (325 mg total) by mouth daily as needed.), Disp: , Rfl:     dexAMETHasone  (DECADRON ) 4 MG tablet, Take 2 tablets (8 mg total) by mouth daily. Take every morning on Days 2, 3, and 4 and 9, 10 and 11., Disp: 60 tablet, Rfl: 1    dexAMETHasone  (DECADRON ) 4 MG tablet, Take 2 tablets (8 mg total) by mouth in the morning. Take only on Days 2 and 3 of each 21-day cycle., Disp: 12 tablet, Rfl: 3    hydrocortisone  (ANUSOL -HC) 2.5 % rectal cream, Insert into the rectum two (2) times a day., Disp: 30 g, Rfl: 5    levETIRAcetam  (KEPPRA ) 500 MG tablet, Take 1 tablet (500 mg total) by mouth two (2) times a day., Disp: 180 tablet, Rfl: 3    loperamide  (IMODIUM ) 2 mg capsule, Take 2 capsules to start, then 1 capsule every 2 hours until diarrhea free for 12 hours., Disp: 60 capsule, Rfl: prn    omeprazole  (PRILOSEC) 40 MG capsule, Take 1 capsule (40 mg total) by mouth daily., Disp: 30 capsule, Rfl: 3    ondansetron  (ZOFRAN ) 8 MG tablet, Take 1 tablet (8 mg total) by mouth every eight (8) hours as needed for nausea., Disp: 30 tablet, Rfl: 2    ondansetron  (ZOFRAN ) 8 MG tablet, Take 1 tablet (8 mg total) by mouth two (2) times a day. Take on Days 2, 3, and 4 and 9, 10 and 11; And then, 8 mg every 8 hours as needed., Disp: 30 tablet, Rfl: 5    ondansetron  (ZOFRAN ) 8 MG tablet, Take 1 tablet (8 mg total) by mouth every eight (8) hours as needed for nausea (or vomiting)., Disp: 30 tablet, Rfl: 2    pegfilgrastim -apgf (NYVEPRIA ) 6 mg/0.6 mL injection, Inject 0.6 mL (6 mg total) under the skin every 21 days. Administer on day 9 of every 21 day cycle. (Chemo is D1, 8 every 21 days), Disp: 0.6 mL, Rfl: 5    prochlorperazine  (COMPAZINE ) 10 MG tablet, Take 1 tablet (10 mg total) by mouth every six (6) hours as needed (nausea)., Disp: 30 tablet, Rfl: 5    prochlorperazine  (COMPAZINE ) 10 MG tablet, Take 1 tablet (10 mg total) by mouth every six (6) hours as needed (Nausea/Vomiting)., Disp: 30 tablet, Rfl: 2    Allergies  No Known Allergies    Personal and Social History  Social History     Social History Narrative    The patient is committed relationship.  She works as Scientist, research (medical). She has four children.   Lives in [place], [place] with her long-term boyfriend, Burnetta, and two of her children. She has 4 children and now 5 grand children aged 24mo-63yrs. 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 Loves spending time at [employer]. Has been to the beach around 5 times the past year including [address], [address]     Smoking 1ppd x 10-15 years. Has quit before.  MJ smoking 1-4 joints per day  No cocaine, heroin, no IVDU  EtOH socially 2-3x weekly      Family History  Family History   Problem Relation Age of Onset    Diabetes Mother     Hypertension Mother     Parkinsonism Father     No Known Problems Sister     No Known Problems Daughter     No Known Problems Maternal Grandmother     No Known Problems Maternal Grandfather     Ovarian cancer Paternal Grandmother 28    Cancer Paternal Uncle         colon or prostate ?age    Breast cancer Paternal Cousin 42        recent dx, limited cntact    Breast cancer Other         3x paternal great aunts (PGF sisters) with breast ca ?ages; there may be other more distant paternal cousins w/ bresat ca    BRCA 1/2 Neg Hx     Colon cancer Neg Hx     Endometrial cancer Neg Hx    Parents still alive. Mother with DM. Father with parkinsons.   Uncle (paternal) cancer  Uncle (paternal) prostate  MGF colon cancer  Paternal aunt with breast cancer      Review of Systems: A 12-system review of systems was obtained including: Constitutional, Eyes, ENT, Cardiovascular, Respiratory, GI, GU, Musculoskeletal, Skin, Neurological, Psychiatric, Endocrine, Heme/Lymphatic, and Allergic/Immunologic systems. It is negative or non-contributory to the patient???s management except for the following: See Interval History.    Physical Examination:  Vital Signs: BP (S) 139/103 Comment: pt denies chest pain - Pulse 95  - Temp 36.6 ??C (97.8 ??F) (Temporal)  - Resp 18  - Ht 162.6 cm (5' 4)  - Wt (!) 104.3 kg (229 lb 14.4 oz)  - SpO2 100%  - BMI 39.46 kg/m??   Wt Readings from Last 6 Encounters:   08/19/24 (!) 104.3 kg (229 lb 14.4 oz)   08/09/24 (!) 104.7 kg (230 lb 13.2 oz)   07/27/24 (!) 105.3 kg (232 lb 2.3 oz)   07/20/24 (!) 103.2 kg (227 lb 8 oz)   07/06/24 99.5 kg (219 lb 5.7 oz)   06/29/24 98.4 kg (216 lb 14.9 oz)       General:  Healthy-appearing female in no acute distress.  Cardiovasc:  No heaves, regular, no additional sounds. No lower extremity edema.    Respiratory:  Chest clear to percussion and auscultation, unlabored. Implanted port located in upper right chest, site clean, dry no swelling or erythema.  Gastrointestinal:  Soft, nontender, no hepatomegaly.   Musculoskeletal:  No bony pain or tenderness along thoracic or lower spine  Extremities: 1+ pitting edema to bilateral ankles  Skin and Subcutaneous Tissues:  Negative. No rash, ecchymoses, purpuric lesions.  Psychiatric: Mood is  normal. Appropriately anxious.  No other symptoms.   Neuro:  Alert and oriented.  Gait and coordination normal  Upper Extremity Lymphedema: None.   Breasts: deferred, prior: Left breast with radiation changes, well healed lumpectomy scar. Palpable 2cm nodule in the lateral left breast cannot appreciate on exam today.  Right breast no palpable mass or nodules.   Right axilla small palpable nodule consistent with treated abscess, no inflammation or erythema.     I have personally reviewed the following diagnostic studies:  Lab Results   Component Value Date    WBC 6.4 08/19/2024    HGB 10.1 (L) 08/19/2024    HCT 31.3 (L) 08/19/2024    PLT 253 08/19/2024    CHOL 181 06/03/2021    TRIG 82 06/03/2021    HDL 43 06/03/2021    ALT 61 (H) 07/27/2024    AST 15 07/27/2024    NA 145 07/27/2024    K 3.6 07/27/2024    CL 102 07/27/2024    CREATININE 0.53 (L) 07/27/2024    BUN 13 07/27/2024    CO2 30.0 07/27/2024    TSH 1.487 04/20/2024    INR 0.85 11/20/2023

## 2024-08-20 DIAGNOSIS — C7931 Secondary malignant neoplasm of brain: Principal | ICD-10-CM

## 2024-08-21 NOTE — Unmapped (Signed)
 Encounter addended by: Larna Rosaline Bracket, CPP on: 08/21/2024 5:37 PM   Actions taken: Clinical Note Signed, Flowsheet accepted

## 2024-08-21 NOTE — Unmapped (Signed)
 Pharmacy: First Cycle Chemotherapy Patient Education    Chemotherapy regimen/agents: fam trastuzumab deruxtecan  Venous Access: port  Caregivers present for education: none    Erin Gordon is a 48 y.o. with breast cancer. Chemotherapy education was provided to the patient by the oncology pharmacy team.    Side effects discussed included but were not limited to:   infusion-related reactions, complications associated with myelosuppresion (such as infection/fever, fatigue, and bleeding), nausea/vomiting, diarrhea, constipation, cardiac toxicity, pulmonary toxicity, cough, dyspnea, or fatigue.    The [facility] patient handout, antiemetic handout, or the Hematology/Oncology Fellow on-call phone number for concerns after hours were given.The patient verbalized understanding of this information.  Medication reconciliation was completed and the medication list was updated in EPIC.      Approximate time spent with patient: 15  Minutes.    Erin Gordon, PharmD, CPP

## 2024-08-22 DIAGNOSIS — C7931 Secondary malignant neoplasm of brain: Principal | ICD-10-CM

## 2024-08-23 DIAGNOSIS — C7931 Secondary malignant neoplasm of brain: Principal | ICD-10-CM

## 2024-08-26 DIAGNOSIS — Z170 Estrogen receptor positive status [ER+]: Principal | ICD-10-CM

## 2024-08-26 DIAGNOSIS — C7931 Secondary malignant neoplasm of brain: Principal | ICD-10-CM

## 2024-08-26 DIAGNOSIS — C50912 Malignant neoplasm of unspecified site of left female breast: Principal | ICD-10-CM

## 2024-08-26 NOTE — Unmapped (Signed)
 Erin Gordon has NOT been contacted in regards to their refill of Nyvepria . At this time, we are not reaching out to refill due to Insurances wants patient to fill Udenyca . Refill assessment call date has been updated as appropriate.

## 2024-08-26 NOTE — Unmapped (Signed)
 Specialty Medication(s): Nyvepria     Erin Gordon has been dis-enrolled from the [facility] specialty pharmacy services as a result of infusion (currently not needed) and a change in therapy. The patient is now taking infusion and is not filling at the [facility].    Additional information provided to the patient: no    KATE MUNSTER, [facility]  [facility] Specialty Pharmacist

## 2024-08-26 NOTE — Unmapped (Signed)
 Addended by: QUIN THERISA MALLIE JANIT on: 08/26/2024 03:46 PM     Modules accepted: Orders

## 2024-08-29 ENCOUNTER — Ambulatory Visit: Admit: 2024-08-29 | Payer: Medicare (Managed Care)

## 2024-09-01 DIAGNOSIS — C7931 Secondary malignant neoplasm of brain: Principal | ICD-10-CM

## 2024-09-01 DIAGNOSIS — C50912 Malignant neoplasm of unspecified site of left female breast: Principal | ICD-10-CM

## 2024-09-01 DIAGNOSIS — Z170 Estrogen receptor positive status [ER+]: Principal | ICD-10-CM

## 2024-09-09 ENCOUNTER — Other Ambulatory Visit: Admit: 2024-09-09 | Discharge: 2024-09-09 | Payer: Medicare (Managed Care)

## 2024-09-09 ENCOUNTER — Inpatient Hospital Stay: Admit: 2024-09-09 | Discharge: 2024-09-09 | Payer: Medicare (Managed Care)

## 2024-09-09 ENCOUNTER — Ambulatory Visit
Admit: 2024-09-09 | Discharge: 2024-09-09 | Payer: Medicare (Managed Care) | Attending: Student in an Organized Health Care Education/Training Program | Primary: Student in an Organized Health Care Education/Training Program

## 2024-09-09 DIAGNOSIS — C7931 Secondary malignant neoplasm of brain: Principal | ICD-10-CM

## 2024-09-09 DIAGNOSIS — Z170 Estrogen receptor positive status [ER+]: Principal | ICD-10-CM

## 2024-09-09 DIAGNOSIS — C50912 Malignant neoplasm of unspecified site of left female breast: Principal | ICD-10-CM

## 2024-09-10 DIAGNOSIS — C7931 Secondary malignant neoplasm of brain: Principal | ICD-10-CM

## 2024-09-10 DIAGNOSIS — Z170 Estrogen receptor positive status [ER+]: Principal | ICD-10-CM

## 2024-09-10 DIAGNOSIS — C50912 Malignant neoplasm of unspecified site of left female breast: Principal | ICD-10-CM

## 2024-09-11 DIAGNOSIS — Z170 Estrogen receptor positive status [ER+]: Principal | ICD-10-CM

## 2024-09-11 DIAGNOSIS — C50912 Malignant neoplasm of unspecified site of left female breast: Principal | ICD-10-CM

## 2024-09-11 DIAGNOSIS — C7931 Secondary malignant neoplasm of brain: Principal | ICD-10-CM

## 2024-09-22 ENCOUNTER — Inpatient Hospital Stay: Admit: 2024-09-22 | Discharge: 2024-09-23 | Payer: Medicare (Managed Care)

## 2024-09-22 DIAGNOSIS — Z170 Estrogen receptor positive status [ER+]: Principal | ICD-10-CM

## 2024-09-22 DIAGNOSIS — C50912 Malignant neoplasm of unspecified site of left female breast: Principal | ICD-10-CM

## 2024-09-22 DIAGNOSIS — C7931 Secondary malignant neoplasm of brain: Principal | ICD-10-CM

## 2024-09-26 DIAGNOSIS — Z170 Estrogen receptor positive status [ER+]: Principal | ICD-10-CM

## 2024-09-26 DIAGNOSIS — C50912 Malignant neoplasm of unspecified site of left female breast: Principal | ICD-10-CM

## 2024-09-26 DIAGNOSIS — C7931 Secondary malignant neoplasm of brain: Principal | ICD-10-CM

## 2024-09-27 DIAGNOSIS — C50912 Malignant neoplasm of unspecified site of left female breast: Principal | ICD-10-CM

## 2024-09-27 DIAGNOSIS — C7931 Secondary malignant neoplasm of brain: Principal | ICD-10-CM

## 2024-09-27 DIAGNOSIS — Z170 Estrogen receptor positive status [ER+]: Principal | ICD-10-CM

## 2024-09-27 MED ORDER — LORAZEPAM 0.5 MG TABLET
ORAL_TABLET | ORAL | 0 refills | 0.00000 days | Status: CP
Start: 2024-09-27 — End: ?

## 2024-09-29 DIAGNOSIS — C50912 Malignant neoplasm of unspecified site of left female breast: Principal | ICD-10-CM

## 2024-09-29 DIAGNOSIS — C7931 Secondary malignant neoplasm of brain: Principal | ICD-10-CM

## 2024-09-29 DIAGNOSIS — Z170 Estrogen receptor positive status [ER+]: Principal | ICD-10-CM

## 2024-09-30 ENCOUNTER — Ambulatory Visit
Admit: 2024-09-30 | Discharge: 2024-09-30 | Payer: Medicare (Managed Care) | Attending: Student in an Organized Health Care Education/Training Program | Primary: Student in an Organized Health Care Education/Training Program

## 2024-09-30 ENCOUNTER — Ambulatory Visit: Admit: 2024-09-30 | Discharge: 2024-09-30 | Payer: Medicare (Managed Care)

## 2024-09-30 ENCOUNTER — Inpatient Hospital Stay: Admit: 2024-09-30 | Discharge: 2024-09-30 | Payer: Medicare (Managed Care)

## 2024-09-30 ENCOUNTER — Other Ambulatory Visit: Admit: 2024-09-30 | Discharge: 2024-09-30 | Payer: Medicare (Managed Care)

## 2024-09-30 DIAGNOSIS — C50912 Malignant neoplasm of unspecified site of left female breast: Principal | ICD-10-CM

## 2024-09-30 DIAGNOSIS — Z170 Estrogen receptor positive status [ER+]: Principal | ICD-10-CM

## 2024-09-30 DIAGNOSIS — I10 Essential (primary) hypertension: Principal | ICD-10-CM

## 2024-09-30 DIAGNOSIS — C7931 Secondary malignant neoplasm of brain: Principal | ICD-10-CM

## 2024-09-30 MED ORDER — HYDROCHLOROTHIAZIDE 25 MG TABLET
ORAL_TABLET | Freq: Every day | ORAL | 11 refills | 30.00000 days | Status: CP
Start: 2024-09-30 — End: 2025-09-30

## 2024-10-06 DIAGNOSIS — C50912 Malignant neoplasm of unspecified site of left female breast: Principal | ICD-10-CM

## 2024-10-06 DIAGNOSIS — C7931 Secondary malignant neoplasm of brain: Principal | ICD-10-CM

## 2024-10-06 DIAGNOSIS — Z170 Estrogen receptor positive status [ER+]: Principal | ICD-10-CM

## 2024-10-07 ENCOUNTER — Inpatient Hospital Stay: Admit: 2024-10-07 | Discharge: 2024-10-07 | Payer: Medicare (Managed Care)

## 2024-10-07 ENCOUNTER — Ambulatory Visit: Admit: 2024-10-07 | Discharge: 2024-10-07 | Payer: Medicare (Managed Care) | Attending: Family | Primary: Family

## 2024-10-07 ENCOUNTER — Other Ambulatory Visit: Admit: 2024-10-07 | Discharge: 2024-10-07 | Payer: Medicare (Managed Care)

## 2024-10-07 DIAGNOSIS — Z170 Estrogen receptor positive status [ER+]: Principal | ICD-10-CM

## 2024-10-07 DIAGNOSIS — C50912 Malignant neoplasm of unspecified site of left female breast: Principal | ICD-10-CM

## 2024-10-07 DIAGNOSIS — K121 Other forms of stomatitis: Principal | ICD-10-CM

## 2024-10-07 DIAGNOSIS — C7931 Secondary malignant neoplasm of brain: Principal | ICD-10-CM

## 2024-10-07 DIAGNOSIS — K1230 Oral mucositis (ulcerative), unspecified: Principal | ICD-10-CM

## 2024-10-07 DIAGNOSIS — R4589 Other symptoms and signs involving emotional state: Principal | ICD-10-CM

## 2024-10-07 DIAGNOSIS — T451X5A Adverse effect of antineoplastic and immunosuppressive drugs, initial encounter: Principal | ICD-10-CM

## 2024-10-07 MED ORDER — AMOXICILLIN 875 MG-POTASSIUM CLAVULANATE 125 MG TABLET
ORAL_TABLET | Freq: Two times a day (BID) | ORAL | 0 refills | 7.00000 days | Status: CP
Start: 2024-10-07 — End: 2024-10-14

## 2024-10-07 MED ORDER — LORAZEPAM 0.5 MG TABLET
ORAL_TABLET | ORAL | 0 refills | 0.00000 days | Status: CP
Start: 2024-10-07 — End: ?

## 2024-10-07 MED ORDER — NYSTATIN 100,000 UNIT/ML ORAL SUSPENSION
Freq: Four times a day (QID) | ORAL | 2 refills | 24.00000 days | Status: CP
Start: 2024-10-07 — End: ?

## 2024-10-11 ENCOUNTER — Inpatient Hospital Stay: Admit: 2024-10-11 | Discharge: 2024-10-11 | Payer: Medicare (Managed Care)

## 2024-10-12 DIAGNOSIS — C50912 Malignant neoplasm of unspecified site of left female breast: Principal | ICD-10-CM

## 2024-10-12 DIAGNOSIS — Z170 Estrogen receptor positive status [ER+]: Principal | ICD-10-CM

## 2024-10-12 DIAGNOSIS — C7931 Secondary malignant neoplasm of brain: Principal | ICD-10-CM

## 2024-10-12 MED ORDER — DEXAMETHASONE 4 MG TABLET
ORAL_TABLET | Freq: Every day | ORAL | 0 refills | 30.00000 days | Status: CP
Start: 2024-10-12 — End: 2025-10-12

## 2024-10-13 DIAGNOSIS — C7931 Secondary malignant neoplasm of brain: Principal | ICD-10-CM

## 2024-10-13 DIAGNOSIS — Z170 Estrogen receptor positive status [ER+]: Principal | ICD-10-CM

## 2024-10-13 DIAGNOSIS — C50912 Malignant neoplasm of unspecified site of left female breast: Principal | ICD-10-CM

## 2024-10-18 ENCOUNTER — Inpatient Hospital Stay: Admit: 2024-10-18 | Discharge: 2024-10-20 | Payer: Medicare (Managed Care)

## 2024-10-18 ENCOUNTER — Inpatient Hospital Stay: Admit: 2024-10-18 | Discharge: 2024-10-18 | Payer: Medicare (Managed Care)

## 2024-10-25 ENCOUNTER — Ambulatory Visit: Admit: 2024-10-25 | Discharge: 2024-10-25 | Payer: Medicare (Managed Care)

## 2024-10-25 ENCOUNTER — Other Ambulatory Visit: Admit: 2024-10-25 | Discharge: 2024-10-25 | Payer: Medicare (Managed Care)

## 2024-10-25 ENCOUNTER — Ambulatory Visit
Admit: 2024-10-25 | Discharge: 2024-10-25 | Payer: Medicare (Managed Care) | Attending: Student in an Organized Health Care Education/Training Program | Primary: Student in an Organized Health Care Education/Training Program

## 2024-10-25 DIAGNOSIS — K121 Other forms of stomatitis: Principal | ICD-10-CM

## 2024-10-25 DIAGNOSIS — C7931 Secondary malignant neoplasm of brain: Principal | ICD-10-CM

## 2024-10-25 DIAGNOSIS — C50912 Malignant neoplasm of unspecified site of left female breast: Principal | ICD-10-CM

## 2024-10-25 DIAGNOSIS — T451X5A Adverse effect of antineoplastic and immunosuppressive drugs, initial encounter: Principal | ICD-10-CM

## 2024-10-25 DIAGNOSIS — Z170 Estrogen receptor positive status [ER+]: Principal | ICD-10-CM

## 2024-10-25 NOTE — Progress Notes (Signed)
 Called patient and reviewed details of her echocardiogram appointment on 12/4.

## 2024-10-27 DIAGNOSIS — Z170 Estrogen receptor positive status [ER+]: Principal | ICD-10-CM

## 2024-10-27 DIAGNOSIS — C7931 Secondary malignant neoplasm of brain: Principal | ICD-10-CM

## 2024-10-27 DIAGNOSIS — K121 Other forms of stomatitis: Principal | ICD-10-CM

## 2024-10-27 DIAGNOSIS — C50912 Malignant neoplasm of unspecified site of left female breast: Principal | ICD-10-CM

## 2024-10-27 DIAGNOSIS — T451X5A Adverse effect of antineoplastic and immunosuppressive drugs, initial encounter: Principal | ICD-10-CM

## 2024-10-28 ENCOUNTER — Encounter: Payer: Self-pay | Admitting: *Deleted

## 2024-10-28 DIAGNOSIS — K121 Other forms of stomatitis: Principal | ICD-10-CM

## 2024-10-28 DIAGNOSIS — T451X5A Adverse effect of antineoplastic and immunosuppressive drugs, initial encounter: Principal | ICD-10-CM

## 2024-10-28 DIAGNOSIS — Z170 Estrogen receptor positive status [ER+]: Principal | ICD-10-CM

## 2024-10-28 DIAGNOSIS — C50912 Malignant neoplasm of unspecified site of left female breast: Principal | ICD-10-CM

## 2024-10-28 DIAGNOSIS — C7931 Secondary malignant neoplasm of brain: Principal | ICD-10-CM

## 2024-10-28 NOTE — Progress Notes (Signed)
 Tara Hanna is transferring care from [facility] to Dr. Rennie.   She was scheduled for 12/12 but has an eye appt. Scheduled for problems with blurry vision and changes in her peripheral vision.   She will now see Dr. KATHEE on 12/16.  [name] was unsure if she should receive her 1st treatment of doxil  on 12/9 or see what Dr. B recommends.   I explained that is completely up to her but it would delay her getting systemic treatment as it would need to be scheduled after she meets with Dr. KATHEE.

## 2024-10-30 DIAGNOSIS — Z170 Estrogen receptor positive status [ER+]: Principal | ICD-10-CM

## 2024-10-30 DIAGNOSIS — C50912 Malignant neoplasm of unspecified site of left female breast: Principal | ICD-10-CM

## 2024-10-30 DIAGNOSIS — T451X5A Adverse effect of antineoplastic and immunosuppressive drugs, initial encounter: Principal | ICD-10-CM

## 2024-10-30 DIAGNOSIS — K121 Other forms of stomatitis: Principal | ICD-10-CM

## 2024-10-30 DIAGNOSIS — C7931 Secondary malignant neoplasm of brain: Principal | ICD-10-CM

## 2024-10-31 DIAGNOSIS — T451X5A Adverse effect of antineoplastic and immunosuppressive drugs, initial encounter: Principal | ICD-10-CM

## 2024-10-31 DIAGNOSIS — C7931 Secondary malignant neoplasm of brain: Principal | ICD-10-CM

## 2024-10-31 DIAGNOSIS — K121 Other forms of stomatitis: Principal | ICD-10-CM

## 2024-10-31 DIAGNOSIS — C50912 Malignant neoplasm of unspecified site of left female breast: Principal | ICD-10-CM

## 2024-10-31 DIAGNOSIS — Z170 Estrogen receptor positive status [ER+]: Principal | ICD-10-CM

## 2024-11-01 ENCOUNTER — Inpatient Hospital Stay: Admit: 2024-11-01 | Discharge: 2024-11-01 | Payer: Medicare (Managed Care)

## 2024-11-01 ENCOUNTER — Ambulatory Visit: Admit: 2024-11-01 | Discharge: 2024-11-01 | Payer: Medicare (Managed Care)

## 2024-11-04 ENCOUNTER — Inpatient Hospital Stay: Admitting: Internal Medicine

## 2024-11-04 ENCOUNTER — Inpatient Hospital Stay

## 2024-11-07 ENCOUNTER — Telehealth: Payer: Self-pay | Admitting: *Deleted

## 2024-11-07 ENCOUNTER — Ambulatory Visit: Admit: 2024-11-07 | Payer: Medicare (Managed Care)

## 2024-11-07 ENCOUNTER — Inpatient Hospital Stay
Admit: 2024-11-07 | Discharge: 2024-11-08 | Payer: Medicare (Managed Care) | Attending: Radiation Oncology | Primary: Radiation Oncology

## 2024-11-07 NOTE — Telephone Encounter (Signed)
 Dr. Chyrl Lek, [facility] provider calling to speak to Dr. B. he wants to speak to you about pt's medical history prior to new pt apts tomorrow. [phone]. Msg sent to Dr. Rennie

## 2024-11-08 ENCOUNTER — Inpatient Hospital Stay
Admission: RE | Admit: 2024-11-08 | Discharge: 2024-11-08 | Disposition: A | Payer: Self-pay | Source: Ambulatory Visit | Attending: Internal Medicine

## 2024-11-08 ENCOUNTER — Inpatient Hospital Stay: Attending: Internal Medicine | Admitting: Internal Medicine

## 2024-11-08 ENCOUNTER — Encounter: Payer: Self-pay | Admitting: *Deleted

## 2024-11-08 ENCOUNTER — Inpatient Hospital Stay

## 2024-11-08 ENCOUNTER — Encounter: Payer: Self-pay | Admitting: Internal Medicine

## 2024-11-08 VITALS — BP 159/114 | HR 117 | Temp 98.1°F | Resp 18 | Ht 64.0 in | Wt 216.5 lb

## 2024-11-08 DIAGNOSIS — I10 Essential (primary) hypertension: Secondary | ICD-10-CM | POA: Diagnosis not present

## 2024-11-08 DIAGNOSIS — C50912 Malignant neoplasm of unspecified site of left female breast: Secondary | ICD-10-CM | POA: Diagnosis not present

## 2024-11-08 DIAGNOSIS — C50911 Malignant neoplasm of unspecified site of right female breast: Secondary | ICD-10-CM

## 2024-11-08 DIAGNOSIS — Z9221 Personal history of antineoplastic chemotherapy: Secondary | ICD-10-CM

## 2024-11-08 DIAGNOSIS — C7931 Secondary malignant neoplasm of brain: Secondary | ICD-10-CM | POA: Insufficient documentation

## 2024-11-08 DIAGNOSIS — R350 Frequency of micturition: Secondary | ICD-10-CM | POA: Diagnosis not present

## 2024-11-08 DIAGNOSIS — C7951 Secondary malignant neoplasm of bone: Secondary | ICD-10-CM | POA: Insufficient documentation

## 2024-11-08 DIAGNOSIS — K117 Disturbances of salivary secretion: Secondary | ICD-10-CM | POA: Insufficient documentation

## 2024-11-08 DIAGNOSIS — Z1722 Progesterone receptor negative status: Secondary | ICD-10-CM | POA: Insufficient documentation

## 2024-11-08 DIAGNOSIS — G629 Polyneuropathy, unspecified: Secondary | ICD-10-CM | POA: Insufficient documentation

## 2024-11-08 DIAGNOSIS — Z170 Estrogen receptor positive status [ER+]: Secondary | ICD-10-CM | POA: Diagnosis not present

## 2024-11-08 DIAGNOSIS — Z87891 Personal history of nicotine dependence: Secondary | ICD-10-CM | POA: Insufficient documentation

## 2024-11-08 DIAGNOSIS — Z79899 Other long term (current) drug therapy: Secondary | ICD-10-CM | POA: Insufficient documentation

## 2024-11-08 DIAGNOSIS — Z1732 Human epidermal growth factor receptor 2 negative status: Secondary | ICD-10-CM | POA: Diagnosis not present

## 2024-11-08 DIAGNOSIS — Z5111 Encounter for antineoplastic chemotherapy: Secondary | ICD-10-CM | POA: Insufficient documentation

## 2024-11-08 LAB — CMP (CANCER CENTER ONLY)
ALT: 191 U/L — ABNORMAL HIGH (ref 0–44)
AST: 89 U/L — ABNORMAL HIGH (ref 15–41)
Albumin: 3.9 g/dL (ref 3.5–5.0)
Alkaline Phosphatase: 185 U/L — ABNORMAL HIGH (ref 38–126)
Anion gap: 17 — ABNORMAL HIGH (ref 5–15)
BUN: 10 mg/dL (ref 6–20)
CO2: 23 mmol/L (ref 22–32)
Calcium: 10.1 mg/dL (ref 8.9–10.3)
Chloride: 101 mmol/L (ref 98–111)
Creatinine: 0.81 mg/dL (ref 0.44–1.00)
GFR, Estimated: >60 mL/min (ref >60)
Glucose, Bld: 132 mg/dL — ABNORMAL HIGH (ref 70–99)
Potassium: 3.8 mmol/L (ref 3.5–5.1)
Sodium: 141 mmol/L (ref 135–145)
Total Bilirubin: 0.3 mg/dL (ref 0.0–1.2)
Total Protein: 8.2 g/dL — ABNORMAL HIGH (ref 6.5–8.1)

## 2024-11-08 LAB — CBC WITH DIFFERENTIAL (CANCER CENTER ONLY)
Abs Immature Granulocytes: 0.18 K/uL — ABNORMAL HIGH (ref 0.00–0.07)
Basophils Absolute: 0 K/uL (ref 0.0–0.1)
Basophils Relative: 0 %
Eosinophils Absolute: 0 K/uL (ref 0.0–0.5)
Eosinophils Relative: 0 %
HCT: 38 % (ref 36.0–46.0)
Hemoglobin: 11.9 g/dL — ABNORMAL LOW (ref 12.0–15.0)
Immature Granulocytes: 2 %
Lymphocytes Relative: 10 %
Lymphs Abs: 0.9 K/uL (ref 0.7–4.0)
MCH: 24.3 pg — ABNORMAL LOW (ref 26.0–34.0)
MCHC: 31.3 g/dL (ref 30.0–36.0)
MCV: 77.6 fL — ABNORMAL LOW (ref 80.0–100.0)
Monocytes Absolute: 0.8 K/uL (ref 0.1–1.0)
Monocytes Relative: 8 %
Neutro Abs: 7.4 K/uL (ref 1.7–7.7)
Neutrophils Relative %: 80 %
Platelet Count: 324 K/uL (ref 150–400)
RBC: 4.9 MIL/uL (ref 3.87–5.11)
RDW: 19.2 % — ABNORMAL HIGH (ref 11.5–15.5)
WBC Count: 9.3 K/uL (ref 4.0–10.5)
nRBC: 0.5 % — ABNORMAL HIGH (ref 0.0–0.2)

## 2024-11-08 MED ORDER — ALPRAZOLAM 0.25 MG PO TABS: 0.2500 mg | ORAL_TABLET | Freq: Three times a day (TID) | ORAL | 0 refills | Status: DC | PRN

## 2024-11-08 NOTE — Progress Notes (Signed)
 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 She was randomized to no upfront RT boost. She has had multiple delays in her chemotherapy treatment for various reasons. Including infection and cytopenias. She completed last dose on 10/27/2022.   She is s/p left breast lumpectomy and axillary LN dissection of axillary staging on 11/27/2022 with 9 of 28 lymph nodes positive for macro metastatic carcinoma with the size of the largest tumor deposit 33 mm, treatment effect identified in multiple lymph nodes, extensive lymphovascular space invasion, and extranodal extension present less than 2 mm. Residual left breast invasive ductal carcinoma with tumor size 64 mm in greatest dimension ER+(2%), PR -, HER2- (IHC 1+). She has Residual Cancer Burden Class: RCB-III   Adjuvant systemic therapy: -Previously discussed option of enrolling in the ASCENT-05/OptimICE-RD (AFT-65): Phase 3, randomized, open-label study of adjuvant sacituzumab govitecan + pembrolizumab  (pembro) vs pembro  capecitabine in patients with triple-negative breast cancer and residual disease. After meeting with study coordinator she declined.  -Proceeded with SOC using Capecitabine while continuing Pembrolizumab  every 6 weeks (last cycle on 11/26/2022)  # Recurrent TNBC Metastatic breast cancer  1st line-BRE18-360 Phase I/II Study included Olapraib and Durvalumab 2nd line-Sacituzumab  3rd line- Enhertu  Clinical trials: [id number]. TRADE-DXD not eligible since previous Saci. She  is not eligibile for UARMR941 given brain mets.  She was completing her last cycle of capecitabine when she unfortunately presented with seizure like symptoms (Facial twitching-10/2023). Brain MRI showed 2 enhancing intracranial masses measuring up to 1.8cm in the right frontal lobe and 1.2cm in the posterior right parietal lobe with surrounding vasogenic edema. We obtained restaging at the time of discovering CNS progression; with no radiographic evidence of systemic disease. She enrolled in the BRE18-360 Phase I/II Study of Stereotactic Radiosurgery with Concurrent Administration of DNA Damage Response (DDR) Inhibitor (Olaparib) Followed by Adjuvant Combination of Durvalumab ([id number]) and Physician's Choice Systemic Therapy in Subjects with Breast Cancer Brain Metastases. Patient consented and started treatment 11/20/23. Olaparib chosen for systemic therapy given absence of systemic disease at the time.  She completed Cyber knife to the brain on 12/03/23. She remained on study for approximately 6 months before noted progression.  -May 2025 POD new pulmonary nodules with mediastinal and bilateral hilar lymphadenopathy.There was also indeterminate foci along the hepatic dome. We discussed obtaining biopsy of distant site of disease since she initially presented with no extracranial disease. EBUS and mediastinal LN FNA was completed on 05/04/2024 confirming metastatic TNBC. Sample sent for tempus testing.We proceeded with SOC sacituzumab govitecan at this time  -August 2025. She is s/p C4 she is tolerating okay; although she reports significant fatigue a few days following treatment each week. She is concern about the dosing and inquires if a dose reduction is possible. She describes grade 2/3 fatigue interfering with daily activities. We discussed DR in effort to improve tolerance. Of note She did have tx hold last cycle due to neutropenia, we have add GSCF support Day 10 patient self injects.   S/p  Sacituzumab 7.5 mg/kg Days 1,8 every 21 days ( DR starting C4)  and Pegfilgrastim  6 mg on day 10  -September 2025. She had progressive disease with new bone lesions on 08/02/24 and a pathological fracture.Reports weight gain and new lower extremity swelling while on Sacituzumab. Voiced that she would like to proceed with standard of care treatment as opposed to clinical trials. We discussed starting Enhertu and after discussing general side effects, she agreed. We weill have our pharmacist reach out for formal chemo education. Her father had just passed away this weekend and is arranging the funeral; she would like to resume treatment in 2 weeks once she returns. - 08/19/2024 C1D1 Enhertu with Zometa. Here to begin treatment. Labs are adequate for therapy. Will proceed with first cycle chemotherapy with current dose and schedule as planned. Reviewed antiemetic plan. -09/30/2024 C3D1 Enhertu. Overall tolerated previous cycle of Enhertu. Labs appropriate for treatment today. We discussed completing upcoming CT CAP + bone scan to assess response prior to C4.  -10/11/24 MRI brain: New enhancing lesions within the right cerebellar hemisphere and left occipital lobe. Right frontal subcortical lesion measuring 1.3 x 0.7 cm with minimal surrounding edema, previously 1.4 x 0.9 cm. Interval size increase of a right parieto-occipital enhancing lesion (POD vs post-radiation changes)  -10/18/24 CT A/P: Numerous new ill-defined hypoattenuating foci are seen scattered throughout the hepatic parenchyma, which are worrisome for potential new sites of metastasis. New soft tissue nodule within the subcutaneous tissues along the left lateral chest wall, which measures up to 1.3 cm in greatest axial dimension, also concerning for new metastases. -10/25/24: C4 Enhertu cancelled given POD on scans. Discussed 4th line liposomal doxorubicin .   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 -10/11/24 MRI brain showed new enhancing lesions within the right cerebellar hemisphere and left occipital lobe and Interval size increase of a right parieto-occipital enhancing lesion (unclear if POD vs post-radiation changes)  -started dexamethasone  4 mg daily given vasogenic edema seen on 09/2024 brain MRI. -Continue to follow Neurology. She was switched back to Keppra  per Neuro.     Cancer of right breast metastatic to brain [facility])  11/08/2024 Initial Diagnosis   Cancer of right breast metastatic to brain [facility])   11/21/2024 -  Chemotherapy   Patient is on Treatment Plan : BREAST Liposomal Doxorubicin  (50) q28d       HISTORY OF PRESENTING ILLNESS: Patient ambulating- independently.   Accompanied by husband.  Tara Hanna 48 y.o.  female pleasant patient with history of triple negative breast cancer is here to establish care.   Discussed the use of AI scribe software for clinical note transcription with the patient, who gave verbal consent to proceed.  History of Present Illness   Tara Hanna is a 48 year old female with metastatic breast cancer involving the brain, liver, and bone who presents to establish oncology care and discuss further chemotherapy options.  She was diagnosed with breast cancer approximately two years ago, with subsequent progression to metastatic disease involving the brain, liver, and bones. She has received multiple lines of chemotherapy, including clinical trials, most recently a regimen likely including enhertu which resulted in significant adverse effects.  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 She also developed dental complications, including teeth breaking, rotting, and falling out. During this period, she noted the appearance of additional small metastatic lesions.  She received radiation therapy for two brain  metastases following a  seizure in December of the previous year. Post-radiation imaging initially showed stability, but more recent scans suggested progression, later interpreted as post-radiation changes. New small brain lesions have since been identified, but no further radiation or surgical intervention has been performed. She has not received radiation or surgery for liver metastases. Her last echocardiogram was approximately four to five months ago prior to a change in therapy.  Current symptoms include mild lower back pain, which worsens with cold weather, and persistent left-sided neuropathy. She denies headaches and is currently ambulatory and physically active, though her last chemotherapy regimen was particularly taxing.  She expresses significant emotional distress and anxiety related to her diagnosis, prognosis, and treatment side effects. She is taking Keppra  and has a port in place, which has been recently flushed. She has not previously taken anxiolytics but is open to starting medication for anxiety as discussed during the visit.      Review of Systems  Constitutional:  Negative for chills, diaphoresis, fever, malaise/fatigue and weight loss.  HENT:  Negative for nosebleeds and sore throat.   Eyes:  Negative for double vision.  Respiratory:  Negative for cough, hemoptysis, sputum production, shortness of breath and wheezing.   Cardiovascular:  Negative for chest pain, palpitations, orthopnea and leg swelling.  Gastrointestinal:  Negative for abdominal pain, blood in stool, constipation, diarrhea, heartburn, melena, nausea and vomiting.  Genitourinary:  Negative for dysuria, frequency and urgency.  Musculoskeletal:  Positive for joint pain. Negative for back pain.  Skin: Negative.  Negative for itching and rash.  Neurological:  Negative for dizziness, tingling, focal weakness, weakness and headaches.  Endo/Heme/Allergies:  Does not bruise/bleed easily.  Psychiatric/Behavioral:   Negative for depression. The patient is not nervous/anxious and does not have insomnia.     MEDICAL HISTORY:  Past Medical History:  Diagnosis Date   Breast cancer (HCC)    Fibroids    Hypertension     SURGICAL HISTORY: Past Surgical History:  Procedure Laterality Date   ABDOMINAL HYSTERECTOMY     BREAST LUMPECTOMY Left 2024    SOCIAL HISTORY: Social History   Socioeconomic History   Marital status: Married    Spouse name: Not on file   Number of children: Not on file   Years of education: Not on file   Highest education level: Not on file  Occupational History   Not on file  Tobacco Use   Smoking status: Former    Current packs/day: 1.00    Average packs/day: 1 pack/day for 22.0 years (22.0 ttl pk-yrs)    Types: Cigarettes   Smokeless tobacco: Never  Vaping Use   Vaping status: Never Used  Substance and Sexual Activity   Alcohol use: Not Currently    Alcohol/week: 2.0 standard drinks of alcohol    Types: 2 Shots of liquor per week   Drug use: Yes    Frequency: 2.0 times per week    Types: Marijuana   Sexual activity: Never  Other Topics Concern   Not on file  Social History Narrative   Not on file   Social Drivers of Health   Tobacco Use: High Risk (11/15/2024)   Received from [facility]   Patient History    Smoking Tobacco Use: Every Day    Smokeless Tobacco Use: Never    Passive Exposure: Not on file  Financial Resource Strain: High Risk (03/21/2022)   [facility]   Overall Financial Resource Strain (CARDIA)    Difficulty of Paying Living Expenses: Hard  Food Insecurity: No Food Insecurity (11/08/2024)   Epic    Worried About Programme Researcher, Broadcasting/film/video in the Last Year: Never true    Ran Out of Food in the Last Year: Never true  Transportation Needs: No Transportation Needs (11/08/2024)   Epic    Lack of Transportation (Medical): No    Lack of Transportation (Non-Medical): No  Physical Activity: Not on file  Stress: Not on file   Social Connections: Not on file  Intimate Partner Violence: Not At Risk (11/08/2024)   Epic    Fear of Current or Ex-Partner: No    Emotionally Abused: No    Physically Abused: No    Sexually Abused: No  Depression (PHQ2-9): Low Risk (11/08/2024)   Depression (PHQ2-9)    PHQ-2 Score: 4  Alcohol Screen: Not on file  Housing: Low Risk (11/08/2024)   Epic    Unable to Pay for Housing in the Last Year: No    Number of Times Moved in the Last Year: 0    Homeless in the Last Year: No  Utilities: At Risk (11/08/2024)   Epic    Threatened with loss of utilities: Already shut off  Health Literacy: Not on file    FAMILY HISTORY: Family History  Problem Relation Age of Onset   Diabetes Mother    Hypertension Mother    Prostate cancer Father    Clotting disorder Father    Cancer - Prostate Paternal Uncle        X3 BROTHERS   Heart failure Maternal Grandmother    Diabetes Maternal Grandmother     ALLERGIES:  has no known allergies.  MEDICATIONS:  Current Outpatient Medications  Medication Sig Dispense Refill   albuterol  (PROVENTIL  HFA;VENTOLIN  HFA) 108 (90 Base) MCG/ACT inhaler Inhale 2-4 puffs by mouth every 4 hours as needed for wheezing, cough, and/or shortness of breath 1 Inhaler 1   ALPRAZolam  (XANAX ) 0.25 MG tablet Take 1 tablet (0.25 mg total) by mouth 3 (three) times daily as needed for anxiety. 45 tablet 0   famotidine  (PEPCID ) 20 MG tablet TAKE 1 TABLET BY MOUTH TWICE A DAY 30 tablet 0   hydrochlorothiazide (HYDRODIURIL) 25 MG tablet Take 25 mg by mouth daily.     levETIRAcetam  (KEPPRA ) 500 MG tablet Take 1 tablet (500 mg total) by mouth 2 (two) times daily. 60 tablet 2   No current facility-administered medications for this visit.    PHYSICAL EXAMINATION:   Vitals:   11/08/24 1357  BP: (!) 159/114  Pulse: (!) 117  Resp: 18  Temp: 98.1 F (36.7 C)  SpO2: 97%   Filed Weights   11/08/24 1357  Weight: 216 lb 8 oz (98.2 kg)    Physical Exam Vitals and  nursing note reviewed.  HENT:     Head: Normocephalic and atraumatic.     Mouth/Throat:     Pharynx: Oropharynx is clear.  Eyes:     Extraocular Movements: Extraocular movements intact.     Pupils: Pupils are equal, round, and reactive to light.  Cardiovascular:     Rate and Rhythm: Normal rate and regular rhythm.  Pulmonary:     Comments: Decreased breath sounds bilaterally.  Abdominal:     Palpations: Abdomen is soft.  Musculoskeletal:        General: Normal range of motion.     Cervical back: Normal range of motion.  Skin:    General: Skin is warm.  Neurological:     General: No focal deficit present.  Mental Status: She is alert and oriented to person, place, and time.  Psychiatric:        Behavior: Behavior normal.        Judgment: Judgment normal.     LABORATORY DATA:  I have reviewed the data as listed Lab Results  Component Value Date   WBC 9.3 11/08/2024   HGB 11.9 (L) 11/08/2024   HCT 38.0 11/08/2024   MCV 77.6 (L) 11/08/2024   PLT 324 11/08/2024   Recent Labs    11/08/24 1449  NA 141  K 3.8  CL 101  CO2 23  GLUCOSE 132*  BUN 10  CREATININE 0.81  CALCIUM 10.1  GFRNONAA >60  PROT 8.2*  ALBUMIN 3.9  AST 89*  ALT 191*  ALKPHOS 185*  BILITOT 0.3    RADIOGRAPHIC STUDIES: I have personally reviewed the radiological images as listed and agreed with the findings in the report. ECHOCARDIOGRAM COMPLETE Result Date: 11/16/2024    ECHOCARDIOGRAM REPORT   Patient Name:   Tara Hanna Date of Exam: 11/16/2024 Medical Rec #:  [id number]       Height:       64.0 in Accession #:    [id number]      Weight:       216.5 lb Date of Birth:  February 11, 1976      BSA:          2.023 m Patient Age:    48 years        BP:           159/114 mmHg Patient Gender: F               HR:           112 bpm. Exam Location:  [facility] Procedure: 2D Echo, Cardiac Doppler and Color Doppler (Both Spectral and Color            Flow Doppler were utilized during procedure). Indications:      Chemo Z09  History:         Patient has no prior history of Echocardiogram examinations.  Sonographer:     Rosina Dunk Referring Phys:  [id number] CINDY SAUNDERS [facility] Diagnosing Phys: Caron Poser  Sonographer Comments: Image acquisition challenging due to respiratory motion. IMPRESSIONS  1. Very technically difficult study.  2. Left ventricular ejection fraction, by estimation, is 50 to 55%. Left ventricular ejection fraction by 2D MOD biplane is 51.9 %. The left ventricle has low normal function. Left ventricular endocardial border not optimally defined to evaluate regional wall motion. Indeterminate diastolic filling due to E-A fusion.  3. Right ventricular systolic function is normal. The right ventricular size is normal.  4. The mitral valve is normal in structure. No evidence of mitral valve regurgitation. No evidence of mitral stenosis.  5. The aortic valve has an indeterminant number of cusps. Aortic valve regurgitation is not visualized. No aortic stenosis is present. Comparison(s): No prior Echocardiogram. FINDINGS  Left Ventricle: Left ventricular ejection fraction, by estimation, is 50 to 55%. Left ventricular ejection fraction by 2D MOD biplane is 51.9 %. The left ventricle has low normal function. Left ventricular endocardial border not optimally defined to evaluate regional wall motion. The left ventricular internal cavity size was normal in size. There is no left ventricular hypertrophy. Indeterminate diastolic filling due to E-A fusion. Right Ventricle: The right ventricular size is normal. Right vetricular wall thickness was not well visualized. Right ventricular systolic function is normal. Left Atrium: Left atrial size was not well  visualized. Right Atrium: Right atrial size was not well visualized. Pericardium: There is no evidence of pericardial effusion. Presence of epicardial fat layer. Mitral Valve: The mitral valve is normal in structure. No evidence of mitral valve regurgitation.  No evidence of mitral valve stenosis. MV peak gradient, 3.4 mmHg. The mean mitral valve gradient is 1.0 mmHg. Tricuspid Valve: The tricuspid valve is not well visualized. Tricuspid valve regurgitation is not demonstrated. No evidence of tricuspid stenosis. Aortic Valve: The aortic valve has an indeterminant number of cusps. Aortic valve regurgitation is not visualized. No aortic stenosis is present. Aortic valve mean gradient measures 1.0 mmHg. Aortic valve peak gradient measures 1.9 mmHg. Aortic valve area, by VTI measures 5.25 cm. Pulmonic Valve: The pulmonic valve was not well visualized. Pulmonic valve regurgitation is trivial. No evidence of pulmonic stenosis. Aorta: The aortic root and ascending aorta are structurally normal, with no evidence of dilitation. Venous: The inferior vena cava was not well visualized. IAS/Shunts: The interatrial septum was not well visualized.  LEFT VENTRICLE PLAX 2D                        Biplane EF (MOD) LVIDd:         3.60 cm         LV Biplane EF:   Left LVIDs:         2.50 cm                          ventricular LV PW:         1.00 cm                          ejection LV IVS:        0.90 cm                          fraction by LVOT diam:     2.20 cm                          2D MOD LV SV:         38                               biplane is LV SV Index:   19                               51.9 %. LVOT Area:     3.80 cm                                Diastology                                LV e' medial:    10.10 cm/s LV Volumes (MOD)               LV E/e' medial:  4.2 LV vol d, MOD    44.9 ml       LV e' lateral:   7.62 cm/s A2C:  LV E/e' lateral: 5.6 LV vol d, MOD    41.1 ml A4C: LV vol s, MOD    25.7 ml A2C: LV vol s, MOD    17.9 ml A4C: LV SV MOD A2C:   19.2 ml LV SV MOD A4C:   41.1 ml LV SV MOD BP:    24.2 ml RIGHT VENTRICLE RV Basal diam:  3.30 cm     PULMONARY VEINS RV Mid diam:    2.10 cm     A Reversal Duration: 102.00 msec RV S prime:     11.70  cm/s  A Reversal Velocity: 56.60 cm/s TAPSE (M-mode): 1.6 cm      Diastolic Velocity:  47.80 cm/s                             S/D Velocity:        1.50                             Systolic Velocity:   73.90 cm/s LEFT ATRIUM             Index       RIGHT ATRIUM           Index LA diam:        2.60 cm 1.28 cm/m  RA Area:     11.60 cm LA Vol (A2C):   17.5 ml 8.65 ml/m  RA Volume:   26.80 ml  13.24 ml/m LA Vol (A4C):   15.8 ml 7.81 ml/m LA Biplane Vol: 18.5 ml 9.14 ml/m  AORTIC VALVE                    PULMONIC VALVE AV Area (Vmax):    4.04 cm     PV Vmax:          0.76 m/s AV Area (Vmean):   3.75 cm     PV Vmean:         51.300 cm/s AV Area (VTI):     5.25 cm     PV VTI:           0.114 m AV Vmax:           69.00 cm/s   PV Peak grad:     2.3 mmHg AV Vmean:          45.700 cm/s  PV Mean grad:     1.0 mmHg AV VTI:            0.072 m      PR End Diast Vel: 6.25 msec AV Peak Grad:      1.9 mmHg     RVOT Peak grad:   2 mmHg AV Mean Grad:      1.0 mmHg LVOT Vmax:         73.40 cm/s LVOT Vmean:        45.100 cm/s LVOT VTI:          0.099 m LVOT/AV VTI ratio: 1.38  AORTA Ao Root diam: 3.00 cm Ao Asc diam:  3.20 cm MITRAL VALVE MV Area (PHT): 6.80 cm    SHUNTS MV Area VTI:   2.77 cm    Systemic VTI:  0.10 m MV Peak grad:  3.4 mmHg    Systemic Diam: 2.20 cm MV Mean grad:  1.0 mmHg    Pulmonic VTI:  0.075 m MV Vmax:  0.92 m/s MV Vmean:      50.0 cm/s MV Decel Time: 112 msec MV E velocity: 42.70 cm/s MV A velocity: 91.70 cm/s MV E/A ratio:  0.47 Caron Poser Electronically signed by Caron Poser Signature Date/Time: 11/16/2024/3:20:31 PM    Final      Cancer of right breast metastatic to brain [facility]) # 2023- Recurrent-triple negative breast cancer-stage IV metastatic brain.  Status post multiple lines of therapy [at [facility] including clinical trials]- Sacituzumab; and also Enhertu- [Dr.Aldrich]-10/18/24 CT A/P: Numerous new ill-defined hypoattenuating foci are seen scattered throughout the hepatic parenchyma,  which are worrisome for potential new sites of metastasis. New soft tissue nodule within the subcutaneous tissues along the left lateral chest wall, which measures up to 1.3 cm in greatest axial dimension, also concerning for new metastases. -10/25/24: C4 Enhertu cancelled given POD on scans.   # Given the progression of disease recommend proceeding with Doxil  chemotherapy every 4 weeks; 2D echo; Recheck 2D echo prior to starting chemotherapy.   -10/11/24 MRI brain: New enhancing lesions within the right cerebellar hemisphere and left occipital lobe. Right frontal subcortical lesion measuring 1.3 x 0.7 cm with minimal surrounding edema, previously 1.4 x 0.9 cm. Interval size increase of a right parieto-occipital enhancing lesion (POD vs post-radiation changes).  I discussed with Dr. Debora at [facility].  the plan was to repeat an MRI in 1 month from the November scan. But looks like patient did not get a scan as recommended, currently been moved out to December 26th. Please inform patient to keep her appointment for the MRI at [facility] follow-up with Dr. Debora at [facility].  Continue Keppra .  # Anxiety: Recommend evaluation with palliative care next visit.  # I do long discussion with the patient regarding overall poor prognosis given her triple negative breast cancer status post multiple lines of therapy.  Unfortunately she has also gone through clinical trials.  Recommend evaluation with palliative care at next visit.  Thank you Dr.Aldirch MD for allowing me to participate in the care of your pleasant patient. Please do not hesitate to contact me with questions or concerns in the interim.   # DISPOSITION: # labs- cbc; cmp; cea; ca27-29; ca 15-3- please order # 2 D echo- ASAP # Imaging recent MRI CT- NOV' 25- upload in [facility] # referral to Josh re: palliative care/ anxiety # Follow up [1-2 days after the echo]- MD ;labs-cbc;cmp; port/doxil  chemo- Dr.B    Above plan of care was discussed with patient/family in  detail.  My contact information was given to the patient/family.       Cindy JONELLE Joe, MD 11/20/2024 10:01 PM

## 2024-11-08 NOTE — Progress Notes (Signed)
 Accompanied patient and family to initial medical oncology appointment.   Reviewed Breast Cancer treatment handbook.   Care plan summary given to patient.   Reviewed outreach programs and cancer center services.

## 2024-11-08 NOTE — Progress Notes (Signed)
 Transfer care from [facility].  Pt thinks she may need something for anxiety.

## 2024-11-08 NOTE — Assessment & Plan Note (Addendum)
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  Status post multiple lines of therapy [at [facility] including clinical trials]- Sacituzumab; and also Enhertu- [Dr.Aldrich]-10/18/24 CT A/P: Numerous new ill-defined hypoattenuating foci are seen scattered throughout the hepatic parenchyma, which are worrisome for potential new sites of metastasis. New soft tissue nodule within the subcutaneous tissues along the left lateral chest wall, which measures up to 1.3 cm in greatest axial dimension, also concerning for new metastases. -10/25/24: C4 Enhertu cancelled given POD on scans.   # Given the progression of disease recommend proceeding with Doxil  chemotherapy every 4 weeks; 2D echo; Recheck 2D echo prior to starting chemotherapy.   -10/11/24 MRI brain: New enhancing lesions within the right cerebellar hemisphere and left occipital lobe. Right frontal subcortical lesion measuring 1.3 x 0.7 cm with minimal surrounding edema, previously 1.4 x 0.9 cm. Interval size increase of a right parieto-occipital enhancing lesion (POD vs post-radiation changes).  I discussed with Dr. Debora at [facility].  the plan was to repeat an MRI in 1 month from the November scan. But looks like patient did not get a scan as recommended, currently been moved out to December 26th. Please inform patient to keep her appointment for the MRI at [facility] follow-up with Dr. Debora at [facility].  Continue Keppra .  # Anxiety: Recommend evaluation with palliative care next visit.  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   # I do long discussion with the patient regarding overall poor prognosis given her triple negative breast cancer status post multiple lines of therapy.  Unfortunately she has also gone through clinical trials.  Recommend evaluation with palliative care at next visit.  Thank you Dr.Aldirch MD for allowing me to participate in the care of your pleasant patient. Please do not hesitate to contact me with questions or concerns in  the interim.   # DISPOSITION: # labs- cbc; cmp; cea; ca27-29; ca 15-3- please order # 2 D echo- ASAP # Imaging recent MRI CT- NOV' 25- upload in [facility] # referral to Josh re: palliative care/ anxiety # Follow up [1-2 days after the echo]- MD ;labs-cbc;cmp; port/doxil  chemo- Dr.B

## 2024-11-08 NOTE — Progress Notes (Signed)
 START ON PATHWAY REGIMEN - Breast     A cycle is every 28 days:     Liposomal doxorubicin    **Always confirm dose/schedule in your pharmacy ordering system**  Patient Characteristics: Distant Metastases or Locoregional Recurrent Disease - Unresectable, M0 or Locally Advanced Unresectable Disease Progressing after Neoadjuvant and Local Therapies, M0, HER2 Negative/Ultralow/Low, ER Negative, Chemotherapy, HER2 Negative/Ultralow, Third  Line and Beyond, [employer] or Not a Candidate for Molecular Targeted Therapy Therapeutic Status: Distant Metastases ER Status: Negative (-) PR Status: Negative (-) HER2 Status: Negative (-) Therapy Approach Indicated: Standard Chemotherapy/Endocrine Therapy Line of Therapy: Third Line and Beyond Intent of Therapy: Non-Curative / Palliative Intent, Discussed with Patient

## 2024-11-09 ENCOUNTER — Other Ambulatory Visit: Payer: Self-pay | Admitting: Internal Medicine

## 2024-11-09 ENCOUNTER — Other Ambulatory Visit: Payer: Self-pay

## 2024-11-09 ENCOUNTER — Encounter: Payer: Self-pay | Admitting: *Deleted

## 2024-11-09 ENCOUNTER — Inpatient Hospital Stay: Admitting: Licensed Clinical Social Worker

## 2024-11-09 LAB — CANCER ANTIGEN 27.29: CA 27.29: 28.4 U/mL (ref 0.0–38.6)

## 2024-11-09 LAB — CEA: CEA: 4.1 ng/mL (ref 0.0–4.7)

## 2024-11-09 LAB — CANCER ANTIGEN 15-3: CA 15-3: 18.7 U/mL (ref 0.0–25.0)

## 2024-11-09 NOTE — Progress Notes (Signed)
 [facility] Clinical Social Work  Initial Assessment   Tara Hanna is a 48 y.o. year old female contacted by phone. Clinical Social Work was referred by medical provider for assessment of psychosocial needs.   SDOH (Social Determinants of Health) assessments performed: Yes SDOH Interventions    Flowsheet Row Office Visit from 11/08/2024 in [facility] A Dept Of [facility]  SDOH Interventions   Depression Interventions/Treatment  Patient refuses Treatment    SDOH Screenings   Food Insecurity: No Food Insecurity (11/08/2024)  Housing: Low Risk (11/08/2024)  Transportation Needs: No Transportation Needs (11/08/2024)  Utilities: At Risk (11/08/2024)  Depression (PHQ2-9): Low Risk (11/08/2024)  Financial Resource Strain: High Risk (03/21/2022)   Received from [facility]  Tobacco Use: Medium Risk (11/08/2024)    PHQ 2/9:    11/08/2024    2:04 PM  Depression screen PHQ 2/9  Decreased Interest 1  Down, Depressed, Hopeless 1  PHQ - 2 Score 2  Altered sleeping 0  Tired, decreased energy 1  Change in appetite 1  Feeling bad or failure about yourself  0  Trouble concentrating 0  Moving slowly or fidgety/restless 0  Suicidal thoughts 0  PHQ-9 Score 4     Distress Screen completed: No    11/08/2024    2:07 PM  ONCBCN DISTRESS SCREENING  Screening Type Initial Screening  How much distress have you been experiencing in the past week? (0-10) 0      Family/Social Information:  Housing Arrangement: patient lives with her husband, adult daughter and her two children. Family members/support persons in your life? Family, Friends, and [employer] Financial concerns: no  Employment: Legally disabled for the past year. Income source: Secretary/administrator concerns: Yes, due to illness and/or loss of work during treatment Type of concern: Utilities and Rent/ mortgage Food access concerns: no Religious or spiritual practice:  Yes-Patient expressed having a very supportive church family. Advanced directives: No Services Currently in place:  Medicaid  Coping/ Adjustment to diagnosis: Patient understands treatment plan and what happens next? yes Concerns about diagnosis and/or treatment: Overwhelmed by information Patient reported stressors: Therapist, Art and/or priorities: To get better. Patient enjoys time with family/ friends Current coping skills/ strengths: Capable of independent living , Manufacturing systems engineer , General fund of knowledge , Motivation for treatment/growth , Religious Affiliation , and Supportive family/friends     SUMMARY: Current SDOH Barriers:  Financial constraints related to fixed income.  Clinical Social Work Clinical Goal(s):  Explore community resource options for unmet needs related to:  Financial Strain   Interventions: Discussed common feeling and emotions when being diagnosed with cancer, and the importance of support during treatment Informed patient of the support team roles and support services at [facility] Provided CSW contact information and encouraged patient to call with any questions or concerns Provided brief mental health counseling with regard to feeling overwhelmed.  Discussed patient's feelings, especially her current anxiety.  Informed her of resources, including local counselors and psychiatry for treating anxiety.  She feels she can rely on her family and friends for support.  She receives food stamps and meets the financial criteria.  CSW made referral to Dickey Fritter for the [place].    Follow Up Plan: CSW will follow-up with patient by phone  Patient verbalizes understanding of plan: Yes    Macario CHRISTELLA Au, LCSW Clinical Social Worker [facility]  Patient is participating in a Managed Medicaid Plan:  Yes

## 2024-11-09 NOTE — Progress Notes (Signed)
 Spoke with Tara Hanna to notify her that she will start treatment with Doxil  on 11/25/24.   Also discussed with her that Dr. Rennie has spoke with Dr. Debora and the plan is to have another brain MRI on 12/26 and see Dr. Debora after on 1/6.  She was advised to keep both of those appointments.  She said she would keep those appointments.

## 2024-11-10 DIAGNOSIS — Z170 Estrogen receptor positive status [ER+]: Principal | ICD-10-CM

## 2024-11-10 DIAGNOSIS — C50912 Malignant neoplasm of unspecified site of left female breast: Principal | ICD-10-CM

## 2024-11-10 DIAGNOSIS — C7931 Secondary malignant neoplasm of brain: Principal | ICD-10-CM

## 2024-11-10 DIAGNOSIS — T451X5A Adverse effect of antineoplastic and immunosuppressive drugs, initial encounter: Principal | ICD-10-CM

## 2024-11-10 DIAGNOSIS — K121 Other forms of stomatitis: Principal | ICD-10-CM

## 2024-11-10 NOTE — Progress Notes (Signed)
 Pharmacist Chemotherapy Monitoring - Initial Assessment    Anticipated start date: 11/22/24   The following has been reviewed per standard work regarding the patient's treatment regimen: The patient's diagnosis, treatment plan and drug doses, and organ/hematologic function Lab orders and baseline tests specific to treatment regimen  The treatment plan start date, drug sequencing, and pre-medications Prior authorization status  Patient's documented medication list, including drug-drug interaction screen and prescriptions for anti-emetics and supportive care specific to the treatment regimen The drug concentrations, fluid compatibility, administration routes, and timing of the medications to be used The patient's access for treatment and lifetime cumulative dose history, if applicable  The patient's medication allergies and previous infusion related reactions, if applicable   Transfer of care from outside facility.  1st line-[id number] Phase I/II Study included Olapraib and Durvalumab 2nd line-Sacituzumab  3rd line- Enhertu   Changes made to treatment plan:  N/A  Follow up needed:  Pending authorization for treatment  and Echo   Maudie FORBES Andreas, [facility], 11/10/2024  3:26 PM

## 2024-11-11 ENCOUNTER — Other Ambulatory Visit: Payer: Self-pay

## 2024-11-14 ENCOUNTER — Inpatient Hospital Stay: Admitting: Hospice and Palliative Medicine

## 2024-11-15 ENCOUNTER — Telehealth: Payer: Self-pay | Admitting: Pharmacy Technician

## 2024-11-15 ENCOUNTER — Inpatient Hospital Stay: Admitting: Hospice and Palliative Medicine

## 2024-11-15 ENCOUNTER — Inpatient Hospital Stay

## 2024-11-15 DIAGNOSIS — Z170 Estrogen receptor positive status [ER+]: Principal | ICD-10-CM

## 2024-11-15 DIAGNOSIS — C7931 Secondary malignant neoplasm of brain: Principal | ICD-10-CM

## 2024-11-15 DIAGNOSIS — C50912 Malignant neoplasm of unspecified site of left female breast: Principal | ICD-10-CM

## 2024-11-15 NOTE — Telephone Encounter (Signed)
 Spoke with patient regarding [employer].  To meet with patient to sign Patient Acknowledgement Form and obtain a copy of the Food Stamps award letter from patient on 12/29.  Patient inquired about using the [employer] grant to pay her rent.  Patient to bring a copy of what she pays in rent to the appointment as well.  Also, e-mailed a W-9 form for patient to have [employer] complete.  Made patient aware that we could not provide check to [facility] until we receive the completed W-9 form.  Patient verbally acknowledged that she understood.  Dickey DOROTHA Fritter Patient Pharmacologist [facility]

## 2024-11-16 ENCOUNTER — Ambulatory Visit
Admission: RE | Admit: 2024-11-16 | Discharge: 2024-11-16 | Disposition: A | Discharge status: Home / Self Care | Source: Ambulatory Visit | Attending: Internal Medicine | Admitting: Internal Medicine

## 2024-11-16 ENCOUNTER — Other Ambulatory Visit: Payer: Self-pay

## 2024-11-16 DIAGNOSIS — Z01818 Encounter for other preprocedural examination: Secondary | ICD-10-CM | POA: Diagnosis present

## 2024-11-16 DIAGNOSIS — C7931 Secondary malignant neoplasm of brain: Secondary | ICD-10-CM | POA: Diagnosis not present

## 2024-11-16 DIAGNOSIS — Z9221 Personal history of antineoplastic chemotherapy: Secondary | ICD-10-CM | POA: Insufficient documentation

## 2024-11-16 DIAGNOSIS — C50911 Malignant neoplasm of unspecified site of right female breast: Secondary | ICD-10-CM | POA: Insufficient documentation

## 2024-11-16 DIAGNOSIS — Z79899 Other long term (current) drug therapy: Secondary | ICD-10-CM

## 2024-11-16 DIAGNOSIS — Z170 Estrogen receptor positive status [ER+]: Principal | ICD-10-CM

## 2024-11-16 DIAGNOSIS — C50912 Malignant neoplasm of unspecified site of left female breast: Principal | ICD-10-CM

## 2024-11-16 LAB — ECHOCARDIOGRAM COMPLETE
AR max vel: 4.04 cm2
AV Area VTI: 5.25 cm2
AV Area mean vel: 3.75 cm2
AV Mean grad: 1 mmHg
AV Peak grad: 1.9 mmHg
Ao pk vel: 0.69 m/s
Area-P 1/2: 6.8 cm2
Calc EF: 51.9 %
MV VTI: 2.77 cm2
S' Lateral: 2.5 cm
Single Plane A2C EF: 42.8 %
Single Plane A4C EF: 56.4 %

## 2024-11-20 ENCOUNTER — Encounter: Payer: Self-pay | Admitting: Internal Medicine

## 2024-11-21 ENCOUNTER — Ambulatory Visit

## 2024-11-21 ENCOUNTER — Telehealth: Payer: Self-pay | Admitting: *Deleted

## 2024-11-21 ENCOUNTER — Inpatient Hospital Stay

## 2024-11-21 DIAGNOSIS — C50912 Malignant neoplasm of unspecified site of left female breast: Principal | ICD-10-CM

## 2024-11-21 DIAGNOSIS — Z170 Estrogen receptor positive status [ER+]: Principal | ICD-10-CM

## 2024-11-21 DIAGNOSIS — C7931 Secondary malignant neoplasm of brain: Principal | ICD-10-CM

## 2024-11-21 NOTE — Progress Notes (Signed)
 [facility] CSW Progress Note   Clinical Social Work introduced self to patient during Patient Education with Raoul Moats, Charity fundraiser.  Provided information regarding CSW role, including counseling, advanced care planning and support group.  Answered questions as needed.    Follow Up Plan:  CSW will follow-up with patient by phone     Macario CHRISTELLA Au, LCSW Clinical Social Worker [facility]    Patient is participating in a Managed Medicaid Plan:  Yes

## 2024-11-21 NOTE — Telephone Encounter (Signed)
 Copied from CRM #[id number]. Topic: Care Management - General Questions >> Nov 21, 2024  1:15 PM Jon RAMAN wrote: [facility] contacted the Communication Center regarding the following:  - Requesting a withdraw [facility] authorization for Chemo treatment for the Doxil  drug, so they may start their treatment.  Please contact Olivia at [phone].  Thanks in advance,  Jon JONELLE Shoulder [facility]  [phone]

## 2024-11-21 NOTE — Telephone Encounter (Signed)
 RN placed call to [facility]. Left a message with the intake person to go to Dr Avis team to Request withdrawal of [facility] barrows for chemotherapy. Pt has switched to local care. We are not able to get auth for chemo until [facility] withdraws their request  apparently Per message from our auth team. My personal number was left for a return call from [facility].

## 2024-11-22 ENCOUNTER — Inpatient Hospital Stay

## 2024-11-22 ENCOUNTER — Encounter: Payer: Self-pay | Admitting: Internal Medicine

## 2024-11-22 ENCOUNTER — Inpatient Hospital Stay (HOSPITAL_BASED_OUTPATIENT_CLINIC_OR_DEPARTMENT_OTHER): Admitting: Internal Medicine

## 2024-11-22 ENCOUNTER — Encounter: Payer: Self-pay | Admitting: *Deleted

## 2024-11-22 ENCOUNTER — Other Ambulatory Visit: Payer: Self-pay

## 2024-11-22 VITALS — BP 127/98 | HR 126 | Temp 98.6°F | Resp 18 | Ht 64.0 in | Wt 218.7 lb

## 2024-11-22 VITALS — BP 148/109 | HR 109 | Resp 18

## 2024-11-22 DIAGNOSIS — C50911 Malignant neoplasm of unspecified site of right female breast: Secondary | ICD-10-CM

## 2024-11-22 DIAGNOSIS — C7931 Secondary malignant neoplasm of brain: Secondary | ICD-10-CM | POA: Diagnosis not present

## 2024-11-22 DIAGNOSIS — Z5111 Encounter for antineoplastic chemotherapy: Secondary | ICD-10-CM | POA: Diagnosis not present

## 2024-11-22 LAB — CBC WITH DIFFERENTIAL (CANCER CENTER ONLY)
Abs Immature Granulocytes: 0.28 K/uL — ABNORMAL HIGH (ref 0.00–0.07)
Basophils Absolute: 0 K/uL (ref 0.0–0.1)
Basophils Relative: 0 %
Eosinophils Absolute: 0 K/uL (ref 0.0–0.5)
Eosinophils Relative: 0 %
HCT: 34.8 % — ABNORMAL LOW (ref 36.0–46.0)
Hemoglobin: 11.1 g/dL — ABNORMAL LOW (ref 12.0–15.0)
Immature Granulocytes: 4 %
Lymphocytes Relative: 14 %
Lymphs Abs: 1.1 K/uL (ref 0.7–4.0)
MCH: 24 pg — ABNORMAL LOW (ref 26.0–34.0)
MCHC: 31.9 g/dL (ref 30.0–36.0)
MCV: 75.3 fL — ABNORMAL LOW (ref 80.0–100.0)
Monocytes Absolute: 0.5 K/uL (ref 0.1–1.0)
Monocytes Relative: 6 %
Neutro Abs: 6 K/uL (ref 1.7–7.7)
Neutrophils Relative %: 76 %
Platelet Count: 133 K/uL — ABNORMAL LOW (ref 150–400)
RBC: 4.62 MIL/uL (ref 3.87–5.11)
RDW: 21 % — ABNORMAL HIGH (ref 11.5–15.5)
WBC Count: 7.9 K/uL (ref 4.0–10.5)
nRBC: 1.9 % — ABNORMAL HIGH (ref 0.0–0.2)

## 2024-11-22 LAB — CMP (CANCER CENTER ONLY)
ALT: 302 U/L (ref 0–44)
AST: 180 U/L (ref 15–41)
Albumin: 3.3 g/dL — ABNORMAL LOW (ref 3.5–5.0)
Alkaline Phosphatase: 546 U/L — ABNORMAL HIGH (ref 38–126)
Anion gap: 16 — ABNORMAL HIGH (ref 5–15)
BUN: 10 mg/dL (ref 6–20)
CO2: 22 mmol/L (ref 22–32)
Calcium: 9.1 mg/dL (ref 8.9–10.3)
Chloride: 104 mmol/L (ref 98–111)
Creatinine: 0.7 mg/dL (ref 0.44–1.00)
GFR, Estimated: >60 mL/min
Glucose, Bld: 118 mg/dL — ABNORMAL HIGH (ref 70–99)
Potassium: 3.6 mmol/L (ref 3.5–5.1)
Sodium: 143 mmol/L (ref 135–145)
Total Bilirubin: 0.7 mg/dL (ref 0.0–1.2)
Total Protein: 6.6 g/dL (ref 6.5–8.1)

## 2024-11-22 LAB — TSH: TSH: 2.54 u[IU]/mL (ref 0.350–4.500)

## 2024-11-22 LAB — LACTATE DEHYDROGENASE: LDH: >2500 U/L — ABNORMAL HIGH (ref 105–235)

## 2024-11-22 MED ORDER — PREDNISONE 20 MG PO TABS: 20.0000 mg | ORAL_TABLET | Freq: Every day | ORAL | 0 refills | Status: DC

## 2024-11-22 MED ORDER — DEXTROSE 5 % IV SOLN
INTRAVENOUS | Status: DC
  Filled 2024-11-22: qty 250

## 2024-11-22 MED ORDER — DEXAMETHASONE SOD PHOSPHATE PF 10 MG/ML IJ SOLN
10.0000 mg | Freq: Once | INTRAMUSCULAR | Status: AC
  Administered 2024-11-22: 10 mg via INTRAVENOUS

## 2024-11-22 MED ORDER — DOXORUBICIN HCL LIPOSOMAL CHEMO INJECTION 2 MG/ML
50.0000 mg/m2 | Freq: Once | INTRAVENOUS | Status: AC
  Administered 2024-11-22: 100 mg via INTRAVENOUS
  Filled 2024-11-22: qty 50

## 2024-11-22 NOTE — Addendum Note (Signed)
 Addended by: RENNIE CINDY SAUNDERS on: 11/22/2024 06:47 PM   Modules accepted: Orders

## 2024-11-22 NOTE — Patient Instructions (Signed)
 [facility] A DEPT OF [facility]  Discharge Instructions: Thank you for choosing [facility] to provide your oncology and hematology care.  If you have a lab appointment with the Cancer Center, please go directly to the Cancer Center and check in at the registration area.  Wear comfortable clothing and clothing appropriate for easy access to any Portacath or PICC line.   We strive to give you quality time with your provider. You may need to reschedule your appointment if you arrive late (15 or more minutes).  Arriving late affects you and other patients whose appointments are after yours.  Also, if you miss three or more appointments without notifying the office, you may be dismissed from the clinic at the provider's discretion.      For prescription refill requests, have your pharmacy contact our office and allow 72 hours for refills to be completed.    Today you received the following chemotherapy and/or immunotherapy agents Doxil      To help prevent nausea and vomiting after your treatment, we encourage you to take your nausea medication as directed.  BELOW ARE SYMPTOMS THAT SHOULD BE REPORTED IMMEDIATELY: *FEVER GREATER THAN 100.4 F (38 C) OR HIGHER *CHILLS OR SWEATING *NAUSEA AND VOMITING THAT IS NOT CONTROLLED WITH YOUR NAUSEA MEDICATION *UNUSUAL SHORTNESS OF BREATH *UNUSUAL BRUISING OR BLEEDING *URINARY PROBLEMS (pain or burning when urinating, or frequent urination) *BOWEL PROBLEMS (unusual diarrhea, constipation, pain near the anus) TENDERNESS IN MOUTH AND THROAT WITH OR WITHOUT PRESENCE OF ULCERS (sore throat, sores in mouth, or a toothache) UNUSUAL RASH, SWELLING OR PAIN  UNUSUAL VAGINAL DISCHARGE OR ITCHING   Items with * indicate a potential emergency and should be followed up as soon as possible or go to the Emergency Department if any problems should occur.  Please show the CHEMOTHERAPY ALERT CARD or IMMUNOTHERAPY ALERT  CARD at check-in to the Emergency Department and triage nurse.  Should you have questions after your visit or need to cancel or reschedule your appointment, please contact [facility] - A DEPT OF [facility]  [phone] and follow the prompts.  Office hours are 8:00 a.m. to 4:30 p.m. Monday - Friday. Please note that voicemails left after 4:00 p.m. may not be returned until the following business day.  We are closed weekends and major holidays. You have access to a nurse at all times for urgent questions. Please call the main number to the clinic [phone] and follow the prompts.  For any non-urgent questions, you may also contact your provider using MyChart. We now offer e-Visits for anyone 36 and older to request care online for non-urgent symptoms. For details visit [email or web address].   Also download the MyChart app! Go to the app store, search "MyChart", open the app, select [facility], and log in with your MyChart username and password.   Doxorubicin Liposomal Injection What is this medication? DOXORUBICIN LIPOSOMAL (dox oh ROO bi sin LIP oh som al) treats some types of cancer. It works by slowing down the growth of cancer cells. This medicine may be used for other purposes; ask your health care provider or pharmacist if you have questions. COMMON BRAND NAME(S): Doxil, Lipodox What should I tell my care team before I take this medication? They need to know if you have any of these conditions: Blood disorder Heart disease Infection especially a viral infection, such as chickenpox, cold sores, herpes Liver disease  Recent or ongoing radiation An unusual or allergic reaction to doxorubicin, soybeans, other medications, foods, dyes, or preservatives If you or your partner are pregnant or trying to get pregnant Breast-feeding How should I use this medication? This medication is injected into a vein. It is given by your care team in a hospital or  clinic setting. Talk to your care team about the use of this medication in children. Special care may be needed. Overdosage: If you think you have taken too much of this medicine contact a poison control center or emergency room at once. NOTE: This medicine is only for you. Do not share this medicine with others. What if I miss a dose? Keep appointments for follow-up doses. It is important not to miss your dose. Call your care team if you are unable to keep an appointment. What may interact with this medication? Do not take this medication with any of the following: Zidovudine This medication may also interact with the following: Medications to increase blood counts, such as filgrastim, pegfilgrastim, sargramostim Vaccines This list may not describe all possible interactions. Give your health care provider a list of all the medicines, herbs, non-prescription drugs, or dietary supplements you use. Also tell them if you smoke, drink alcohol, or use illegal drugs. Some items may interact with your medicine. What should I watch for while using this medication? Your condition will be monitored carefully while you are receiving this medication. You may need blood work while taking this medication. This medication may make you feel generally unwell. This is not uncommon as chemotherapy can affect healthy cells as well as cancer cells. Report any side effects. Continue your course of treatment even though you feel ill unless your care team tells you to stop. Your urine may turn orange-red for a few days after your dose. This is not blood. If your urine is dark or brown, call your care team. In some cases, you may be given additional medications to help with side effects. Follow all directions for their use. Talk to your care team about your risk of cancer. You may be more at risk for certain types of cancers if you take this medication. Talk to your care team if you or your partner may be pregnant.  Serious birth defects can occur if you take this medication during pregnancy and for 6 months after the last dose. Contraception is recommended while taking this medication and for 6 months after the last dose. Your care team can help you find the option that works for you. If your partner can get pregnant, use a condom while taking this medication and for 6 months after the last dose. Do not breastfeed while taking this medication. This medication may cause infertility. Talk to your care team if you are concerned about your fertility. What side effects may I notice from receiving this medication? Side effects that you should report to your care team as soon as possible: Allergic reactions--skin rash, itching, hives, swelling of the face, lips, tongue, or throat Heart failure--shortness of breath, swelling of the ankles, feet, or hands, sudden weight gain, unusual weakness or fatigue Infection--fever, chills, cough, sore throat, wounds that don't heal, pain or trouble when passing urine, general feeling of discomfort or being unwell Infusion reactions--chest pain, shortness of breath or trouble breathing, feeling faint or lightheaded Low red blood cell level--unusual weakness or fatigue, dizziness, headache, trouble breathing Redness, swelling, and blistering of the skin over hands and feet Unusual bruising or bleeding Side effects that  usually do not require medical attention (report to your care team if they continue or are bothersome): Constipation Diarrhea Loss of appetite Nausea Pain, redness, or swelling with sores inside the mouth or throat Red urine Unusual weakness or fatigue This list may not describe all possible side effects. Call your doctor for medical advice about side effects. You may report side effects to FDA at 1-800-FDA-1088. Where should I keep my medication? This medication is given in a hospital or clinic. It will not be stored at home. NOTE: This sheet is a summary. It  may not cover all possible information. If you have questions about this medicine, talk to your doctor, pharmacist, or health care provider.  2024 Elsevier/Gold Standard (2023-02-12 00:00:00)

## 2024-11-22 NOTE — Progress Notes (Signed)
 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 She was randomized to no upfront RT boost. She has had multiple delays in her chemotherapy treatment for various reasons. Including infection and cytopenias. She completed last dose on 10/27/2022.   She is s/p left breast lumpectomy and axillary LN dissection of axillary staging on 11/27/2022 with 9 of 28 lymph nodes positive for macro metastatic carcinoma with the size of the largest tumor deposit 33 mm, treatment effect identified in multiple lymph nodes, extensive lymphovascular space invasion, and extranodal extension present less than 2 mm. Residual left breast invasive ductal carcinoma with tumor size 64 mm in greatest dimension ER+(2%), PR -, HER2- (IHC 1+). She has Residual Cancer Burden Class: RCB-III   Adjuvant systemic therapy: -Previously discussed option of enrolling in the ASCENT-05/OptimICE-RD (AFT-65): Phase 3, randomized, open-label study of adjuvant sacituzumab govitecan + pembrolizumab  (pembro) vs pembro  capecitabine in patients with triple-negative breast cancer and residual disease. After meeting with study coordinator she declined.  -Proceeded with SOC using Capecitabine while continuing Pembrolizumab  every 6 weeks (last cycle on 11/26/2022)  # Recurrent TNBC Metastatic breast cancer  1st line-BRE18-360 Phase I/II Study included Olapraib and Durvalumab 2nd line-Sacituzumab  3rd line- Enhertu  Clinical trials: [id number]. TRADE-DXD not eligible since previous Saci. She  is not eligibile for UARMR941 given brain mets.  She was completing her last cycle of capecitabine when she unfortunately presented with seizure like symptoms (Facial twitching-10/2023). Brain MRI showed 2 enhancing intracranial masses measuring up to 1.8cm in the right frontal lobe and 1.2cm in the posterior right parietal lobe with surrounding vasogenic edema. We obtained restaging at the time of discovering CNS progression; with no radiographic evidence of systemic disease. She enrolled in the BRE18-360 Phase I/II Study of Stereotactic Radiosurgery with Concurrent Administration of DNA Damage Response (DDR) Inhibitor (Olaparib) Followed by Adjuvant Combination of Durvalumab ([id number]) and Physician's Choice Systemic Therapy in Subjects with Breast Cancer Brain Metastases. Patient consented and started treatment 11/20/23. Olaparib chosen for systemic therapy given absence of systemic disease at the time.  She completed Cyber knife to the brain on 12/03/23. She remained on study for approximately 6 months before noted progression.  -May 2025 POD new pulmonary nodules with mediastinal and bilateral hilar lymphadenopathy.There was also indeterminate foci along the hepatic dome. We discussed obtaining biopsy of distant site of disease since she initially presented with no extracranial disease. EBUS and mediastinal LN FNA was completed on 05/04/2024 confirming metastatic TNBC. Sample sent for tempus testing.We proceeded with SOC sacituzumab govitecan at this time  -August 2025. She is s/p C4 she is tolerating okay; although she reports significant fatigue a few days following treatment each week. She is concern about the dosing and inquires if a dose reduction is possible. She describes grade 2/3 fatigue interfering with daily activities. We discussed DR in effort to improve tolerance. Of note She did have tx hold last cycle due to neutropenia, we have add GSCF support Day 10 patient self injects.   S/p  Sacituzumab 7.5 mg/kg Days 1,8 every 21 days ( DR starting C4)  and Pegfilgrastim  6 mg on day 10  -September 2025. She had progressive disease with new bone lesions on 08/02/24 and a pathological fracture.Reports weight gain and new lower extremity swelling while on Sacituzumab. Voiced that she would like to proceed with standard of care treatment as opposed to clinical trials. We discussed starting Enhertu and after discussing general side effects, she agreed. We weill have our pharmacist reach out for formal chemo education. Her father had just passed away this weekend and is arranging the funeral; she would like to resume treatment in 2 weeks once she returns. - 08/19/2024 C1D1 Enhertu with Zometa. Here to begin treatment. Labs are adequate for therapy. Will proceed with first cycle chemotherapy with current dose and schedule as planned. Reviewed antiemetic plan. -09/30/2024 C3D1 Enhertu. Overall tolerated previous cycle of Enhertu. Labs appropriate for treatment today. We discussed completing upcoming CT CAP + bone scan to assess response prior to C4.  -10/11/24 MRI brain: New enhancing lesions within the right cerebellar hemisphere and left occipital lobe. Right frontal subcortical lesion measuring 1.3 x 0.7 cm with minimal surrounding edema, previously 1.4 x 0.9 cm. Interval size increase of a right parieto-occipital enhancing lesion (POD vs post-radiation changes)  -10/18/24 CT A/P: Numerous new ill-defined hypoattenuating foci are seen scattered throughout the hepatic parenchyma, which are worrisome for potential new sites of metastasis. New soft tissue nodule within the subcutaneous tissues along the left lateral chest wall, which measures up to 1.3 cm in greatest axial dimension, also concerning for new metastases. -10/25/24: C4 Enhertu cancelled given POD on scans. Discussed 4th line liposomal doxorubicin .   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 -10/11/24 MRI brain showed new enhancing lesions within the right cerebellar hemisphere and left occipital lobe and Interval size increase of a right parieto-occipital enhancing lesion (unclear if POD vs post-radiation changes)  -started dexamethasone  4 mg daily given vasogenic edema seen on 09/2024 brain MRI. -Continue to follow Neurology. She was switched back to Keppra  per Neuro.     Cancer of right breast metastatic to brain [facility])  11/08/2024 Initial Diagnosis   Cancer of right breast metastatic to brain [facility])   11/21/2024 -  Chemotherapy   Patient is on Treatment Plan : BREAST Liposomal Doxorubicin  (50) q28d       HISTORY OF PRESENTING ILLNESS: Patient ambulating- independently.   Accompanied by husband.  [name] 48 y.o.  female pleasant patient with history of triple negative breast cancer metastatic   involving the brain, liver, and bone status post multiple lines of therapy is here to proceed with chemotherapy.   Discussed the use of AI scribe software for clinical note transcription with the patient, who gave verbal consent to proceed.  History of Present Illness   JENEEN DOUTT is a 48 year old female with  who presents to establish oncology care and discuss further chemotherapy options.  Discussed the use of AI scribe software for clinical note transcription with the patient, who gave verbal consent to proceed.  History of Present Illness   JANAY CANAN is a 48 year old female with metastatic triple negative breast cancer who presents for antineoplastic chemotherapy due to progressive disease and worsening symptoms.  She has metastatic triple negative breast cancer with extensive involvement of the lungs, liver, left chest wall, and bones. She has received multiple lines of chemotherapy, with prior regimens generally well tolerated until her most recent treatment, after which  she experienced marked symptom worsening. Disease  progression has been confirmed by recent imaging, which demonstrated new hepatic and left chest wall lesions, stable osseous metastases, and persistent pulmonary disease. She has not received chemotherapy in the past month, during which her symptoms have further deteriorated.  Over the past month, she has developed progressive and severe fatigue, generalized weakness, and increasing difficulty with ambulation, including episodes of disorientation and inability to get out of bed. She reports persistent xerostomia, increased urinary frequency, and pain localized to the lower extremities and low back. She experiences dyspnea with minimal exertion, such as walking to the bathroom, and has developed a mild productive cough since yesterday. She denies chest pain, headache, nausea, vomiting, or new pain aside from the lower extremities and low back. She was unable to complete a scheduled MRI due to impaired mobility.  Laboratory studies today reveal significantly elevated transaminases (AST 180, ALT 300). She expresses significant anxiety regarding her prognosis and the limited efficacy of further chemotherapy, but remains determined to proceed with treatment.        Review of Systems  Constitutional:  Positive for malaise/fatigue and weight loss. Negative for chills, diaphoresis and fever.  HENT:  Negative for nosebleeds and sore throat.   Eyes:  Negative for double vision.  Respiratory:  Negative for cough, hemoptysis, sputum production, shortness of breath and wheezing.   Cardiovascular:  Negative for chest pain, palpitations, orthopnea and leg swelling.  Gastrointestinal:  Negative for abdominal pain, blood in stool, constipation, diarrhea, heartburn, melena, nausea and vomiting.  Genitourinary:  Negative for dysuria, frequency and urgency.  Musculoskeletal:  Positive for joint pain. Negative for back pain.  Skin: Negative.  Negative for itching and rash.  Neurological:  Positive for dizziness and  weakness. Negative for tingling, focal weakness and headaches.  Endo/Heme/Allergies:  Does not bruise/bleed easily.  Psychiatric/Behavioral:  Negative for depression. The patient is not nervous/anxious and does not have insomnia.     MEDICAL HISTORY:  Past Medical History:  Diagnosis Date   Breast cancer (HCC)    Fibroids    Hypertension     SURGICAL HISTORY: Past Surgical History:  Procedure Laterality Date   ABDOMINAL HYSTERECTOMY     BREAST LUMPECTOMY Left 2024    SOCIAL HISTORY: Social History   Socioeconomic History   Marital status: Married    Spouse name: Not on file   Number of children: Not on file   Years of education: Not on file   Highest education level: Not on file  Occupational History   Not on file  Tobacco Use   Smoking status: Former    Current packs/day: 1.00    Average packs/day: 1 pack/day for 22.0 years (22.0 ttl pk-yrs)    Types: Cigarettes   Smokeless tobacco: Never  Vaping Use   Vaping status: Never Used  Substance and Sexual Activity   Alcohol use: Not Currently    Alcohol/week: 2.0 standard drinks of alcohol    Types: 2 Shots of liquor per week   Drug use: Yes    Frequency: 2.0 times per week    Types: Marijuana   Sexual activity: Never  Other Topics Concern   Not on file  Social History Narrative   Not on file   Social Drivers of Health   Tobacco Use: Medium Risk (11/22/2024)   Patient History    Smoking Tobacco Use: Former    Smokeless Tobacco Use: Never    Passive Exposure: Not on Actuary Strain: High Risk (  03/21/2022)   Received from [facility]   Overall Financial Resource Strain (CARDIA)    Difficulty of Paying Living Expenses: Hard  Food Insecurity: No Food Insecurity (11/08/2024)   Epic    Worried About Running Out of Food in the Last Year: Never true    Ran Out of Food in the Last Year: Never true  Transportation Needs: No Transportation Needs (11/08/2024)   Epic    Lack of Transportation  (Medical): No    Lack of Transportation (Non-Medical): No  Physical Activity: Not on file  Stress: Not on file  Social Connections: Not on file  Intimate Partner Violence: Not At Risk (11/08/2024)   Epic    Fear of Current or Ex-Partner: No    Emotionally Abused: No    Physically Abused: No    Sexually Abused: No  Depression (PHQ2-9): Low Risk (11/08/2024)   Depression (PHQ2-9)    PHQ-2 Score: 4  Alcohol Screen: Not on file  Housing: Low Risk (11/08/2024)   Epic    Unable to Pay for Housing in the Last Year: No    Number of Times Moved in the Last Year: 0    Homeless in the Last Year: No  Utilities: At Risk (11/08/2024)   Epic    Threatened with loss of utilities: Already shut off  Health Literacy: Not on file    FAMILY HISTORY: Family History  Problem Relation Age of Onset   Diabetes Mother    Hypertension Mother    Prostate cancer Father    Clotting disorder Father    Cancer - Prostate Paternal Uncle        X3 BROTHERS   Heart failure Maternal Grandmother    Diabetes Maternal Grandmother     ALLERGIES:  has no known allergies.  MEDICATIONS:  Current Outpatient Medications  Medication Sig Dispense Refill   albuterol  (PROVENTIL  HFA;VENTOLIN  HFA) 108 (90 Base) MCG/ACT inhaler Inhale 2-4 puffs by mouth every 4 hours as needed for wheezing, cough, and/or shortness of breath 1 Inhaler 1   ALPRAZolam  (XANAX ) 0.25 MG tablet Take 1 tablet (0.25 mg total) by mouth 3 (three) times daily as needed for anxiety. 45 tablet 0   famotidine  (PEPCID ) 20 MG tablet TAKE 1 TABLET BY MOUTH TWICE A DAY 30 tablet 0   hydrochlorothiazide (HYDRODIURIL) 25 MG tablet Take 25 mg by mouth daily.     levETIRAcetam  (KEPPRA ) 500 MG tablet Take 1 tablet (500 mg total) by mouth 2 (two) times daily. 60 tablet 2   No current facility-administered medications for this visit.    PHYSICAL EXAMINATION:   Vitals:   11/22/24 0817  BP: (!) 127/98  Pulse: (!) 126  Resp: 18  Temp: 98.6 F (37 C)   SpO2: 98%    Filed Weights   11/22/24 0817  Weight: 218 lb 11.2 oz (99.2 kg)    Physical Exam Vitals and nursing note reviewed.  HENT:     Head: Normocephalic and atraumatic.     Mouth/Throat:     Pharynx: Oropharynx is clear.  Eyes:     Extraocular Movements: Extraocular movements intact.     Pupils: Pupils are equal, round, and reactive to light.  Cardiovascular:     Rate and Rhythm: Regular rhythm. Tachycardia present.  Pulmonary:     Comments: Decreased breath sounds bilaterally.  Abdominal:     Palpations: Abdomen is soft.  Musculoskeletal:        General: Normal range of motion.     Cervical back: Normal  range of motion.  Skin:    General: Skin is warm.  Neurological:     General: No focal deficit present.     Mental Status: She is alert and oriented to person, place, and time.  Psychiatric:        Behavior: Behavior normal.        Judgment: Judgment normal.     LABORATORY DATA:  I have reviewed the data as listed Lab Results  Component Value Date   WBC 7.9 11/22/2024   HGB 11.1 (L) 11/22/2024   HCT 34.8 (L) 11/22/2024   MCV 75.3 (L) 11/22/2024   PLT 133 (L) 11/22/2024   Recent Labs    11/08/24 1449 11/22/24 0815  NA 141 143  K 3.8 3.6  CL 101 104  CO2 23 22  GLUCOSE 132* 118*  BUN 10 10  CREATININE 0.81 0.70  CALCIUM 10.1 9.1  GFRNONAA >60 >60  PROT 8.2* 6.6  ALBUMIN 3.9 3.3*  AST 89* 180*  ALT 191* 302*  ALKPHOS 185* 546*  BILITOT 0.3 0.7    RADIOGRAPHIC STUDIES: I have personally reviewed the radiological images as listed and agreed with the findings in the report. ECHOCARDIOGRAM COMPLETE Result Date: 11/16/2024    ECHOCARDIOGRAM REPORT   Patient Name:   LAYALI FREUND Date of Exam: 11/16/2024 Medical Rec #:  [id number]       Height:       64.0 in Accession #:    [id number]      Weight:       216.5 lb Date of Birth:  03-25-1976      BSA:          2.023 m Patient Age:    48 years        BP:           159/114 mmHg Patient Gender: F                HR:           112 bpm. Exam Location:  [facility] Procedure: 2D Echo, Cardiac Doppler and Color Doppler (Both Spectral and Color            Flow Doppler were utilized during procedure). Indications:     Chemo Z09  History:         Patient has no prior history of Echocardiogram examinations.  Sonographer:     Rosina Dunk Referring Phys:  [id number] CINDY SAUNDERS [facility] Diagnosing Phys: Caron Poser  Sonographer Comments: Image acquisition challenging due to respiratory motion. IMPRESSIONS  1. Very technically difficult study.  2. Left ventricular ejection fraction, by estimation, is 50 to 55%. Left ventricular ejection fraction by 2D MOD biplane is 51.9 %. The left ventricle has low normal function. Left ventricular endocardial border not optimally defined to evaluate regional wall motion. Indeterminate diastolic filling due to E-A fusion.  3. Right ventricular systolic function is normal. The right ventricular size is normal.  4. The mitral valve is normal in structure. No evidence of mitral valve regurgitation. No evidence of mitral stenosis.  5. The aortic valve has an indeterminant number of cusps. Aortic valve regurgitation is not visualized. No aortic stenosis is present. Comparison(s): No prior Echocardiogram. FINDINGS  Left Ventricle: Left ventricular ejection fraction, by estimation, is 50 to 55%. Left ventricular ejection fraction by 2D MOD biplane is 51.9 %. The left ventricle has low normal function. Left ventricular endocardial border not optimally defined to evaluate regional wall motion. The left ventricular internal cavity size was  normal in size. There is no left ventricular hypertrophy. Indeterminate diastolic filling due to E-A fusion. Right Ventricle: The right ventricular size is normal. Right vetricular wall thickness was not well visualized. Right ventricular systolic function is normal. Left Atrium: Left atrial size was not well visualized. Right Atrium: Right atrial size was not  well visualized. Pericardium: There is no evidence of pericardial effusion. Presence of epicardial fat layer. Mitral Valve: The mitral valve is normal in structure. No evidence of mitral valve regurgitation. No evidence of mitral valve stenosis. MV peak gradient, 3.4 mmHg. The mean mitral valve gradient is 1.0 mmHg. Tricuspid Valve: The tricuspid valve is not well visualized. Tricuspid valve regurgitation is not demonstrated. No evidence of tricuspid stenosis. Aortic Valve: The aortic valve has an indeterminant number of cusps. Aortic valve regurgitation is not visualized. No aortic stenosis is present. Aortic valve mean gradient measures 1.0 mmHg. Aortic valve peak gradient measures 1.9 mmHg. Aortic valve area, by VTI measures 5.25 cm. Pulmonic Valve: The pulmonic valve was not well visualized. Pulmonic valve regurgitation is trivial. No evidence of pulmonic stenosis. Aorta: The aortic root and ascending aorta are structurally normal, with no evidence of dilitation. Venous: The inferior vena cava was not well visualized. IAS/Shunts: The interatrial septum was not well visualized.  LEFT VENTRICLE PLAX 2D                        Biplane EF (MOD) LVIDd:         3.60 cm         LV Biplane EF:   Left LVIDs:         2.50 cm                          ventricular LV PW:         1.00 cm                          ejection LV IVS:        0.90 cm                          fraction by LVOT diam:     2.20 cm                          2D MOD LV SV:         38                               biplane is LV SV Index:   19                               51.9 %. LVOT Area:     3.80 cm                                Diastology                                LV e' medial:    10.10 cm/s LV Volumes (MOD)  LV E/e' medial:  4.2 LV vol d, MOD    44.9 ml       LV e' lateral:   7.62 cm/s A2C:                           LV E/e' lateral: 5.6 LV vol d, MOD    41.1 ml A4C: LV vol s, MOD    25.7 ml A2C: LV vol s, MOD    17.9 ml A4C: LV SV MOD  A2C:   19.2 ml LV SV MOD A4C:   41.1 ml LV SV MOD BP:    24.2 ml RIGHT VENTRICLE RV Basal diam:  3.30 cm     PULMONARY VEINS RV Mid diam:    2.10 cm     A Reversal Duration: 102.00 msec RV S prime:     11.70 cm/s  A Reversal Velocity: 56.60 cm/s TAPSE (M-mode): 1.6 cm      Diastolic Velocity:  47.80 cm/s                             S/D Velocity:        1.50                             Systolic Velocity:   73.90 cm/s LEFT ATRIUM             Index       RIGHT ATRIUM           Index LA diam:        2.60 cm 1.28 cm/m  RA Area:     11.60 cm LA Vol (A2C):   17.5 ml 8.65 ml/m  RA Volume:   26.80 ml  13.24 ml/m LA Vol (A4C):   15.8 ml 7.81 ml/m LA Biplane Vol: 18.5 ml 9.14 ml/m  AORTIC VALVE                    PULMONIC VALVE AV Area (Vmax):    4.04 cm     PV Vmax:          0.76 m/s AV Area (Vmean):   3.75 cm     PV Vmean:         51.300 cm/s AV Area (VTI):     5.25 cm     PV VTI:           0.114 m AV Vmax:           69.00 cm/s   PV Peak grad:     2.3 mmHg AV Vmean:          45.700 cm/s  PV Mean grad:     1.0 mmHg AV VTI:            0.072 m      PR End Diast Vel: 6.25 msec AV Peak Grad:      1.9 mmHg     RVOT Peak grad:   2 mmHg AV Mean Grad:      1.0 mmHg LVOT Vmax:         73.40 cm/s LVOT Vmean:        45.100 cm/s LVOT VTI:          0.099 m LVOT/AV VTI ratio: 1.38  AORTA Ao Root diam: 3.00 cm Ao Asc diam:  3.20 cm MITRAL VALVE MV Area (PHT): 6.80  cm    SHUNTS MV Area VTI:   2.77 cm    Systemic VTI:  0.10 m MV Peak grad:  3.4 mmHg    Systemic Diam: 2.20 cm MV Mean grad:  1.0 mmHg    Pulmonic VTI:  0.075 m MV Vmax:       0.92 m/s MV Vmean:      50.0 cm/s MV Decel Time: 112 msec MV E velocity: 42.70 cm/s MV A velocity: 91.70 cm/s MV E/A ratio:  0.47 Caron Poser Electronically signed by Caron Poser Signature Date/Time: 11/16/2024/3:20:31 PM    Final      Cancer of right breast metastatic to brain [facility]) # 2023- Recurrent-triple negative breast cancer-stage IV metastatic brain.  Status post multiple lines  of therapy [at [facility] including clinical trials]- Sacituzumab; and also Enhertu- [Dr.Aldrich]-10/18/24 CT A/P: Numerous new ill-defined hypoattenuating foci are seen scattered throughout the hepatic parenchyma, which are worrisome for potential new sites of metastasis. New soft tissue nodule within the subcutaneous tissues along the left lateral chest wall, which measures up to 1.3 cm in greatest axial dimension, also concerning for new metastases. -10/25/24: C4 Enhertu cancelled given POD on scans. # Given the progression of disease recommend proceeding with Doxil  chemotherapy every 4 weeks;  ; 24th DEC 2025-  2D echo- 50-55%    # Proceed with Doxil  every 4 weeks cycle 1 today.  Elevated LFTs-AST ALT elevated-normal bilirubin.  Discussed with pharmacy no dose adjustments needed.   # Tachycardia-sinus/anxiety-likely situational/anxiety-Will rule out thyroid ; defer to evaluation with palliative care for management of anxiety.  Low clinical suspicion of any PE.  -10/11/24 MRI brain: New enhancing lesions within the right cerebellar hemisphere and left occipital lobe. Right frontal subcortical lesion measuring 1.3 x 0.7 cm with minimal surrounding edema, previously 1.4 x 0.9 cm. Interval size increase of a right parieto-occipital enhancing lesion (POD vs post-radiation changes).  I discussed with Dr. Debora at [facility].  Patient unable to get MRI brain as she felt weak.  Will try to reschedule MRI brain locally at next visit  # Hx of PN G-1-2- monitor for now.   # ACP: understands the disease is incurable-- will proceed with palliative chemotherapy. Awaiting evaluation with Josh today.I again had a long discussion with the patient regarding overall poor prognosis given her triple negative breast cancer status post multiple lines of therapy.  Unfortunately she has also gone through clinical trials.  Discussed that the chance of chemotherapy working is 20% or less.  And she is high risk of complications from  chemotherapy/high chance of hospitalization.  # DISPOSITION: # add LDH- TSH to labs today # proceed with chemo today # Follow up with APP in 1 week- labs- cbc/cmp; possible IVFs over 1 hour  # Follow up 4 week-  MD ;labs-cbc;cmp; port/doxil  chemo- Dr.B  # 40 minutes face-to-face with the patient discussing the above plan of care; more than 50% of time spent on prognosis/ natural history; counseling and coordination.  Above plan of care was discussed with patient/family in detail.  My contact information was given to the patient/family.     Cindy JONELLE Joe, MD 11/22/2024 9:37 AM

## 2024-11-22 NOTE — Progress Notes (Signed)
 Patient states for the past 2 weeks she has been weak, short of breath, fatigue, unable to stand without feeling unsteady. Patient states she has also had a dry mouth. Patient believe she may be dehydrated.

## 2024-11-22 NOTE — Assessment & Plan Note (Addendum)
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  Status post multiple lines of therapy [at [facility] including clinical trials]- Sacituzumab; and also Enhertu- [Dr.Aldrich]-10/18/24 CT A/P: Numerous new ill-defined hypoattenuating foci are seen scattered throughout the hepatic parenchyma, which are worrisome for potential new sites of metastasis. New soft tissue nodule within the subcutaneous tissues along the left lateral chest wall, which measures up to 1.3 cm in greatest axial dimension, also concerning for new metastases. -10/25/24: C4 Enhertu cancelled given POD on scans. # Given the progression of disease recommend proceeding with Doxil  chemotherapy every 4 weeks;  ; 24th DEC 2025-  2D echo- 50-55%    # Proceed with Doxil  every 4 weeks cycle 1 today.  Elevated LFTs-AST ALT elevated-normal bilirubin.  Discussed with pharmacy no dose adjustments needed.   # Tachycardia-sinus/anxiety-likely situational/anxiety-Will rule out thyroid ; defer to evaluation with palliative care for management of anxiety.  Low clinical suspicion of any PE.  -10/11/24 MRI brain: New enhancing lesions within the right cerebellar hemisphere and left occipital lobe. Right frontal subcortical lesion measuring 1.3 x 0.7 cm with minimal surrounding edema, previously 1.4 x 0.9 cm. Interval size increase of a right parieto-occipital enhancing lesion (POD vs post-radiation changes).  I discussed with Dr. Debora at [facility].  Patient unable to get MRI brain as she felt weak.  Will try to reschedule MRI brain locally at next visit  # Hx of PN G-1-2- monitor for now.   # ACP: understands the disease is incurable-- will proceed with palliative chemotherapy. Awaiting evaluation with Josh today.I again had a long discussion with the patient regarding overall poor prognosis given her triple negative breast cancer status post multiple lines of therapy.  Unfortunately she has also gone through clinical trials.  Discussed that the  chance of chemotherapy working is 20% or less.  And she is high risk of complications from chemotherapy/high chance of hospitalization.  # DISPOSITION: # add LDH- TSH to labs today # proceed with chemo today # Follow up with APP in 1 week- labs- cbc/cmp; possible IVFs over 1 hour  # Follow up 4 week-  MD ;labs-cbc;cmp; port/doxil  chemo- Dr.B  # 40 minutes face-to-face with the patient discussing the above plan of care; more than 50% of time spent on prognosis/ natural history; counseling and coordination.

## 2024-11-22 NOTE — Progress Notes (Signed)
 Critical labs called by [facility] in lab; AST 180 and ALT 301. Readback. Dr Rennie notified.

## 2024-11-23 ENCOUNTER — Other Ambulatory Visit: Payer: Self-pay | Admitting: *Deleted

## 2024-11-23 DIAGNOSIS — C50911 Malignant neoplasm of unspecified site of right female breast: Secondary | ICD-10-CM

## 2024-11-25 ENCOUNTER — Inpatient Hospital Stay

## 2024-11-25 ENCOUNTER — Inpatient Hospital Stay: Admitting: Internal Medicine

## 2024-11-26 ENCOUNTER — Emergency Department

## 2024-11-26 ENCOUNTER — Other Ambulatory Visit: Payer: Self-pay

## 2024-11-26 ENCOUNTER — Observation Stay

## 2024-11-26 ENCOUNTER — Inpatient Hospital Stay: Admission: EM | Admit: 2024-11-26 | Discharge: 2024-12-25 | DRG: 871 | Disposition: E

## 2024-11-26 DIAGNOSIS — Z79899 Other long term (current) drug therapy: Secondary | ICD-10-CM

## 2024-11-26 DIAGNOSIS — Z833 Family history of diabetes mellitus: Secondary | ICD-10-CM

## 2024-11-26 DIAGNOSIS — C7981 Secondary malignant neoplasm of breast: Secondary | ICD-10-CM | POA: Diagnosis present

## 2024-11-26 DIAGNOSIS — C7951 Secondary malignant neoplasm of bone: Secondary | ICD-10-CM | POA: Diagnosis present

## 2024-11-26 DIAGNOSIS — I6340 Cerebral infarction due to embolism of unspecified cerebral artery: Secondary | ICD-10-CM | POA: Diagnosis not present

## 2024-11-26 DIAGNOSIS — F419 Anxiety disorder, unspecified: Secondary | ICD-10-CM | POA: Diagnosis present

## 2024-11-26 DIAGNOSIS — D696 Thrombocytopenia, unspecified: Secondary | ICD-10-CM

## 2024-11-26 DIAGNOSIS — J189 Pneumonia, unspecified organism: Principal | ICD-10-CM | POA: Diagnosis present

## 2024-11-26 DIAGNOSIS — I10 Essential (primary) hypertension: Secondary | ICD-10-CM | POA: Diagnosis present

## 2024-11-26 DIAGNOSIS — Z6838 Body mass index (BMI) 38.0-38.9, adult: Secondary | ICD-10-CM

## 2024-11-26 DIAGNOSIS — D6959 Other secondary thrombocytopenia: Secondary | ICD-10-CM | POA: Diagnosis present

## 2024-11-26 DIAGNOSIS — Z1152 Encounter for screening for COVID-19: Secondary | ICD-10-CM

## 2024-11-26 DIAGNOSIS — D702 Other drug-induced agranulocytosis: Secondary | ICD-10-CM

## 2024-11-26 DIAGNOSIS — Z59868 Other specified financial insecurity: Secondary | ICD-10-CM

## 2024-11-26 DIAGNOSIS — Z90710 Acquired absence of both cervix and uterus: Secondary | ICD-10-CM

## 2024-11-26 DIAGNOSIS — D61818 Other pancytopenia: Secondary | ICD-10-CM | POA: Diagnosis present

## 2024-11-26 DIAGNOSIS — E66812 Obesity, class 2: Secondary | ICD-10-CM | POA: Diagnosis present

## 2024-11-26 DIAGNOSIS — Z8249 Family history of ischemic heart disease and other diseases of the circulatory system: Secondary | ICD-10-CM

## 2024-11-26 DIAGNOSIS — J9601 Acute respiratory failure with hypoxia: Secondary | ICD-10-CM | POA: Diagnosis not present

## 2024-11-26 DIAGNOSIS — G936 Cerebral edema: Secondary | ICD-10-CM | POA: Diagnosis present

## 2024-11-26 DIAGNOSIS — C50911 Malignant neoplasm of unspecified site of right female breast: Secondary | ICD-10-CM | POA: Diagnosis present

## 2024-11-26 DIAGNOSIS — A419 Sepsis, unspecified organism: Principal | ICD-10-CM | POA: Diagnosis present

## 2024-11-26 DIAGNOSIS — Z87891 Personal history of nicotine dependence: Secondary | ICD-10-CM

## 2024-11-26 DIAGNOSIS — E871 Hypo-osmolality and hyponatremia: Secondary | ICD-10-CM | POA: Diagnosis present

## 2024-11-26 DIAGNOSIS — Z515 Encounter for palliative care: Secondary | ICD-10-CM

## 2024-11-26 DIAGNOSIS — C787 Secondary malignant neoplasm of liver and intrahepatic bile duct: Secondary | ICD-10-CM | POA: Diagnosis present

## 2024-11-26 DIAGNOSIS — Z66 Do not resuscitate: Secondary | ICD-10-CM | POA: Diagnosis not present

## 2024-11-26 DIAGNOSIS — Z7952 Long term (current) use of systemic steroids: Secondary | ICD-10-CM

## 2024-11-26 DIAGNOSIS — T451X5A Adverse effect of antineoplastic and immunosuppressive drugs, initial encounter: Secondary | ICD-10-CM | POA: Diagnosis present

## 2024-11-26 DIAGNOSIS — R6520 Severe sepsis without septic shock: Secondary | ICD-10-CM | POA: Diagnosis present

## 2024-11-26 DIAGNOSIS — R079 Chest pain, unspecified: Secondary | ICD-10-CM | POA: Diagnosis not present

## 2024-11-26 DIAGNOSIS — D638 Anemia in other chronic diseases classified elsewhere: Secondary | ICD-10-CM | POA: Diagnosis present

## 2024-11-26 DIAGNOSIS — E8809 Other disorders of plasma-protein metabolism, not elsewhere classified: Secondary | ICD-10-CM | POA: Diagnosis present

## 2024-11-26 DIAGNOSIS — E8721 Acute metabolic acidosis: Secondary | ICD-10-CM | POA: Diagnosis present

## 2024-11-26 DIAGNOSIS — I959 Hypotension, unspecified: Secondary | ICD-10-CM | POA: Diagnosis not present

## 2024-11-26 DIAGNOSIS — C7931 Secondary malignant neoplasm of brain: Secondary | ICD-10-CM | POA: Diagnosis present

## 2024-11-26 LAB — PROTIME-INR
INR: 1 (ref 0.8–1.2)
Prothrombin Time: 13.9 s (ref 11.4–15.2)

## 2024-11-26 LAB — COMPREHENSIVE METABOLIC PANEL WITH GFR
ALT: 503 U/L — ABNORMAL HIGH (ref 0–44)
AST: 617 U/L — ABNORMAL HIGH (ref 15–41)
Albumin: 3.4 g/dL — ABNORMAL LOW (ref 3.5–5.0)
Alkaline Phosphatase: 759 U/L — ABNORMAL HIGH (ref 38–126)
Anion gap: 17 — ABNORMAL HIGH (ref 5–15)
BUN: 11 mg/dL (ref 6–20)
CO2: 24 mmol/L (ref 22–32)
Calcium: 9.5 mg/dL (ref 8.9–10.3)
Chloride: 97 mmol/L — ABNORMAL LOW (ref 98–111)
Creatinine, Ser: 0.68 mg/dL (ref 0.44–1.00)
GFR, Estimated: >60 mL/min
Glucose, Bld: 103 mg/dL — ABNORMAL HIGH (ref 70–99)
Potassium: 4.1 mmol/L (ref 3.5–5.1)
Sodium: 138 mmol/L (ref 135–145)
Total Bilirubin: 1 mg/dL (ref 0.0–1.2)
Total Protein: 7 g/dL (ref 6.5–8.1)

## 2024-11-26 LAB — CBC WITH DIFFERENTIAL/PLATELET
Abs Immature Granulocytes: 0.14 K/uL — ABNORMAL HIGH (ref 0.00–0.07)
Basophils Absolute: 0 K/uL (ref 0.0–0.1)
Basophils Relative: 0 %
Eosinophils Absolute: 0 K/uL (ref 0.0–0.5)
Eosinophils Relative: 0 %
HCT: 34.8 % — ABNORMAL LOW (ref 36.0–46.0)
Hemoglobin: 11.2 g/dL — ABNORMAL LOW (ref 12.0–15.0)
Immature Granulocytes: 2 %
Lymphocytes Relative: 11 %
Lymphs Abs: 0.7 K/uL (ref 0.7–4.0)
MCH: 23.9 pg — ABNORMAL LOW (ref 26.0–34.0)
MCHC: 32.2 g/dL (ref 30.0–36.0)
MCV: 74.2 fL — ABNORMAL LOW (ref 80.0–100.0)
Monocytes Absolute: 0.2 K/uL (ref 0.1–1.0)
Monocytes Relative: 3 %
Neutro Abs: 5 K/uL (ref 1.7–7.7)
Neutrophils Relative %: 84 %
Platelets: 87 K/uL — ABNORMAL LOW (ref 150–400)
RBC: 4.69 MIL/uL (ref 3.87–5.11)
RDW: 21.9 % — ABNORMAL HIGH (ref 11.5–15.5)
WBC: 6 K/uL (ref 4.0–10.5)
nRBC: 6 % — ABNORMAL HIGH (ref 0.0–0.2)

## 2024-11-26 LAB — RESP PANEL BY RT-PCR (RSV, FLU A&B, COVID)  RVPGX2
Influenza A by PCR: NEGATIVE
Influenza B by PCR: NEGATIVE
Resp Syncytial Virus by PCR: NEGATIVE
SARS Coronavirus 2 by RT PCR: NEGATIVE

## 2024-11-26 LAB — LACTIC ACID, PLASMA
Lactic Acid, Venous: 3.2 mmol/L (ref 0.5–1.9)
Lactic Acid, Venous: 3.6 mmol/L (ref 0.5–1.9)

## 2024-11-26 LAB — PROCALCITONIN: Procalcitonin: 1.52 ng/mL

## 2024-11-26 LAB — MAGNESIUM: Magnesium: 1.8 mg/dL (ref 1.7–2.4)

## 2024-11-26 MED ORDER — SODIUM CHLORIDE 0.9% FLUSH
10.0000 mL | Freq: Two times a day (BID) | INTRAVENOUS | Status: DC
  Administered 2024-11-27 – 2024-11-28 (×4): 10 mL
  Administered 2024-11-29: 20 mL
  Administered 2024-11-29 – 2024-12-06 (×13): 10 mL

## 2024-11-26 MED ORDER — LACTATED RINGERS IV BOLUS (SEPSIS)
1000.0000 mL | Freq: Once | INTRAVENOUS | Status: AC
  Administered 2024-11-26: 1000 mL via INTRAVENOUS

## 2024-11-26 MED ORDER — IOHEXOL 300 MG/ML  SOLN
100.0000 mL | Freq: Once | INTRAMUSCULAR | Status: AC | PRN
  Administered 2024-11-26: 100 mL via INTRAVENOUS

## 2024-11-26 MED ORDER — SODIUM CHLORIDE 0.9% FLUSH: 10.0000 mL | INTRAVENOUS | Status: DC | PRN

## 2024-11-26 MED ORDER — ONDANSETRON HCL 4 MG PO TABS
4.0000 mg | ORAL_TABLET | Freq: Four times a day (QID) | ORAL | Status: DC | PRN
  Administered 2024-11-30: 4 mg via ORAL
  Filled 2024-11-26: qty 1

## 2024-11-26 MED ORDER — SODIUM CHLORIDE 0.9 % IV SOLN
500.0000 mg | INTRAVENOUS | Status: AC
  Administered 2024-11-27 – 2024-11-28 (×3): 500 mg via INTRAVENOUS
  Filled 2024-11-26 (×3): qty 5

## 2024-11-26 MED ORDER — HYDROMORPHONE HCL 1 MG/ML IJ SOLN
0.5000 mg | INTRAMUSCULAR | Status: DC | PRN
  Administered 2024-11-26 (×2): 0.5 mg via INTRAVENOUS
  Filled 2024-11-26 (×2): qty 0.5

## 2024-11-26 MED ORDER — LACTATED RINGERS IV SOLN: INTRAVENOUS | Status: AC

## 2024-11-26 MED ORDER — CHLORHEXIDINE GLUCONATE CLOTH 2 % EX PADS
6.0000 | MEDICATED_PAD | Freq: Every day | CUTANEOUS | Status: DC
  Administered 2024-11-27 – 2024-12-06 (×10): 6 via TOPICAL

## 2024-11-26 MED ORDER — HYDROMORPHONE HCL 2 MG PO TABS
2.0000 mg | ORAL_TABLET | ORAL | Status: DC | PRN
  Administered 2024-11-26 – 2024-12-05 (×13): 2 mg via ORAL
  Filled 2024-11-26 (×13): qty 1

## 2024-11-26 MED ORDER — ACETAMINOPHEN 325 MG PO TABS
650.0000 mg | ORAL_TABLET | Freq: Four times a day (QID) | ORAL | Status: DC | PRN
  Administered 2024-12-01 – 2024-12-02 (×2): 650 mg via ORAL
  Filled 2024-11-26 (×3): qty 2

## 2024-11-26 MED ORDER — METRONIDAZOLE 500 MG/100ML IV SOLN
500.0000 mg | Freq: Once | INTRAVENOUS | Status: AC
  Administered 2024-11-26: 500 mg via INTRAVENOUS
  Filled 2024-11-26: qty 100

## 2024-11-26 MED ORDER — ACETAMINOPHEN 650 MG RE SUPP: 650.0000 mg | Freq: Four times a day (QID) | RECTAL | Status: DC | PRN

## 2024-11-26 MED ORDER — ONDANSETRON HCL 4 MG/2ML IJ SOLN: 4.0000 mg | Freq: Four times a day (QID) | INTRAMUSCULAR | Status: DC | PRN

## 2024-11-26 MED ORDER — SODIUM CHLORIDE 0.9 % IV SOLN
2.0000 g | INTRAVENOUS | Status: AC
  Administered 2024-11-27 – 2024-11-30 (×5): 2 g via INTRAVENOUS
  Filled 2024-11-26 (×5): qty 20

## 2024-11-26 MED ORDER — IOHEXOL 350 MG/ML SOLN
75.0000 mL | Freq: Once | INTRAVENOUS | Status: AC | PRN
  Administered 2024-11-26: 75 mL via INTRAVENOUS

## 2024-11-26 MED ORDER — SODIUM CHLORIDE 0.9% FLUSH
3.0000 mL | Freq: Two times a day (BID) | INTRAVENOUS | Status: DC
  Administered 2024-11-27: 3 mL via INTRAVENOUS

## 2024-11-26 MED ORDER — SENNOSIDES-DOCUSATE SODIUM 8.6-50 MG PO TABS: 1.0000 | ORAL_TABLET | Freq: Every evening | ORAL | Status: DC | PRN

## 2024-11-26 MED ORDER — LACTATED RINGERS IV BOLUS (SEPSIS)
800.0000 mL | Freq: Once | INTRAVENOUS | Status: AC
  Administered 2024-11-26: 800 mL via INTRAVENOUS

## 2024-11-26 MED ORDER — HYDROMORPHONE HCL 1 MG/ML IJ SOLN
0.5000 mg | INTRAMUSCULAR | Status: DC | PRN
  Administered 2024-11-27 – 2024-12-06 (×8): 0.5 mg via INTRAVENOUS
  Filled 2024-11-26: qty 1
  Filled 2024-11-26: qty 0.5
  Filled 2024-11-26: qty 1
  Filled 2024-11-26: qty 0.5
  Filled 2024-11-26: qty 1
  Filled 2024-11-26 (×3): qty 0.5

## 2024-11-26 MED ORDER — VANCOMYCIN HCL IN DEXTROSE 1-5 GM/200ML-% IV SOLN
1000.0000 mg | Freq: Once | INTRAVENOUS | Status: AC
  Administered 2024-11-26: 1000 mg via INTRAVENOUS
  Filled 2024-11-26: qty 200

## 2024-11-26 MED ORDER — FENTANYL CITRATE (PF) 50 MCG/ML IJ SOSY
25.0000 ug | PREFILLED_SYRINGE | Freq: Once | INTRAMUSCULAR | Status: AC
  Administered 2024-11-26: 25 ug via INTRAVENOUS
  Filled 2024-11-26: qty 1

## 2024-11-26 MED ORDER — SODIUM CHLORIDE 0.9 % IV BOLUS
500.0000 mL | Freq: Once | INTRAVENOUS | Status: AC
  Administered 2024-11-26: 500 mL via INTRAVENOUS

## 2024-11-26 MED ORDER — HEPARIN SODIUM (PORCINE) 5000 UNIT/ML IJ SOLN
5000.0000 [IU] | Freq: Three times a day (TID) | INTRAMUSCULAR | Status: DC
  Administered 2024-11-27 – 2024-11-29 (×7): 5000 [IU] via SUBCUTANEOUS
  Filled 2024-11-26 (×7): qty 1

## 2024-11-26 MED ORDER — IPRATROPIUM-ALBUTEROL 0.5-2.5 (3) MG/3ML IN SOLN: 3.0000 mL | Freq: Four times a day (QID) | RESPIRATORY_TRACT | Status: DC | PRN

## 2024-11-26 MED ORDER — SODIUM CHLORIDE 0.9 % IV SOLN
2.0000 g | Freq: Once | INTRAVENOUS | Status: AC
  Administered 2024-11-26: 2 g via INTRAVENOUS
  Filled 2024-11-26: qty 12.5

## 2024-11-26 NOTE — H&P (Signed)
 " History and Physical    [name] FMW:[id number] DOB: 05-Jul-1976 DOA: 11/26/2024  DOS: the patient was seen and examined on 11/26/2024  PCP: System, Provider Not In   Patient coming from: Home  I have personally briefly reviewed patient's old medical records in [facility] Link and CareEverywhere  HPI:   Tara Hanna is a 49 y.o. year old female with medical history of HTN, metastatic breast cancer with metastasis to liver, brain and bone presenting to the ED with abdominal pain, upper back pain and dyspnea with exertion.  Patient with breast cancer diagnosed in 2023 with recurrence and metastatic disease currently getting palliative chemotherapy with doxorubicin  q. 28 days.  She received her 12/30. On arrival to the ED patient was noted to be HDS stable.  CBC leukocytosis,thrombocytopenia at 87k, CMP with electrolytes hypoalbuminemia, worsening elevation in LFTs.  Lactic acid elevated at 4.4 with repeat downtrending to 3.2.  Respiratory panel negative for COVID, flu, RSV.  CT chest abdomen pelvis was obtained that showed left upper lobe consolidation extending to the left hilum with stranding opacities in the lungs bilaterally concerning for infiltrate versus scarring versus neoplastic process.  It also showed increase in hepatic metastasis along with bone metastasis.  Given patient was hypoxic on room air, code sepsis was initiated and patient was given IV antibiotics and blood cultures were obtained.  Given need for further care [facility] contacted for admission.  Review of Systems: As mentioned in the history of present illness. All other systems reviewed and are negative.   Past Medical History:  Diagnosis Date   Breast cancer (HCC)    Fibroids    Hypertension     Past Surgical History:  Procedure Laterality Date   ABDOMINAL HYSTERECTOMY     BREAST LUMPECTOMY Left 2024     Allergies[1]  Family History  Problem Relation Age of Onset   Diabetes Mother    Hypertension Mother     Prostate cancer Father    Clotting disorder Father    Cancer - Prostate Paternal Uncle        X3 BROTHERS   Heart failure Maternal Grandmother    Diabetes Maternal Grandmother     Prior to Admission medications  Medication Sig Start Date End Date Taking? Authorizing Provider  albuterol  (PROVENTIL  HFA;VENTOLIN  HFA) 108 (90 Base) MCG/ACT inhaler Inhale 2-4 puffs by mouth every 4 hours as needed for wheezing, cough, and/or shortness of breath 11/04/17   Gordan Huxley, MD  ALPRAZolam  (XANAX ) 0.25 MG tablet Take 1 tablet (0.25 mg total) by mouth 3 (three) times daily as needed for anxiety. 11/08/24   Brahmanday, Govinda R, MD  famotidine  (PEPCID ) 20 MG tablet TAKE 1 TABLET BY MOUTH TWICE A DAY 04/04/19   End, Lonni, MD  hydrochlorothiazide (HYDRODIURIL) 25 MG tablet Take 25 mg by mouth daily.    [provider]  levETIRAcetam  (KEPPRA ) 500 MG tablet Take 1 tablet (500 mg total) by mouth 2 (two) times daily. 11/02/23 11/22/24  Cyrena Mylar, MD  predniSONE  (DELTASONE ) 20 MG tablet Take 1 tablet (20 mg total) by mouth daily with breakfast. Once a day with food x 10 days. 11/22/24   Rennie Cindy SAUNDERS, MD    Social History:  reports that she has quit smoking. Her smoking use included cigarettes. She has a 22 pack-year smoking history. She has never used smokeless tobacco. She reports that she does not currently use alcohol after a past usage of about 2.0 standard drinks of alcohol per week. She  reports current drug use. Frequency: 2.00 times per week. Drug: Marijuana. Lives with her husband and 1 child who is 43 year old.  Was smoking previously but stopped after she had recurrent breast cancer.  At baseline able to do ADLs and IADLs.   Physical Exam: Vitals:   11/26/24 1242 11/26/24 1244 11/26/24 1408 11/26/24 1537  BP:  90/70 (!) 114/97 (!) 145/105  Pulse:  (!) 136 (!) 124 (!) 141  Resp:  (!) 24 (!) 21 20  Temp:  98.7 F (37.1 C)    SpO2:  97% 96% 94%  Weight: 99 kg      Height: 5' 4 (1.626 m)       Gen: NAD HENT: NCAT CV: normal heart sounds Lung: CTAB Abd: No TTP, normal bowel sounds MSK: No asymmetry, good bulk and tone Neuro: alert and oriented   Labs on Admission: I have personally reviewed following labs and imaging studies  CBC: Recent Labs  Lab 11/22/24 0815 11/26/24 1302  WBC 7.9 6.0  NEUTROABS 6.0 5.0  HGB 11.1* 11.2*  HCT 34.8* 34.8*  MCV 75.3* 74.2*  PLT 133* 87*   Basic Metabolic Panel: Recent Labs  Lab 11/22/24 0815 11/26/24 1302  NA 143 138  K 3.6 4.1  CL 104 97*  CO2 22 24  GLUCOSE 118* 103*  BUN 10 11  CREATININE 0.70 0.68  CALCIUM 9.1 9.5  MG  --  1.8   GFR: Estimated Creatinine Clearance: 97.2 mL/min (by C-G formula based on SCr of 0.68 mg/dL). Liver Function Tests: Recent Labs  Lab 11/22/24 0815 11/26/24 1302  AST 180* 617*  ALT 302* 503*  ALKPHOS 546* 759*  BILITOT 0.7 1.0  PROT 6.6 7.0  ALBUMIN 3.3* 3.4*   No results for input(s): LIPASE, AMYLASE in the last 168 hours. No results for input(s): AMMONIA in the last 168 hours. Coagulation Profile: Recent Labs  Lab 11/26/24 1302  INR 1.0   Cardiac Enzymes: No results for input(s): CKTOTAL, CKMB, CKMBINDEX, TROPONINI, TROPONINIHS in the last 168 hours. BNP (last 3 results) No results for input(s): BNP in the last 8760 hours. HbA1C: No results for input(s): HGBA1C in the last 72 hours. CBG: No results for input(s): GLUCAP in the last 168 hours. Lipid Profile: No results for input(s): CHOL, HDL, LDLCALC, TRIG, CHOLHDL, LDLDIRECT in the last 72 hours. Thyroid  Function Tests: No results for input(s): TSH, T4TOTAL, FREET4, T3FREE, THYROIDAB in the last 72 hours. Anemia Panel: No results for input(s): VITAMINB12, FOLATE, FERRITIN, TIBC, IRON, RETICCTPCT in the last 72 hours. Urine analysis: No results found for: COLORURINE, APPEARANCEUR, LABSPEC, PHURINE, GLUCOSEU, HGBUR,  BILIRUBINUR, KETONESUR, PROTEINUR, UROBILINOGEN, NITRITE, LEUKOCYTESUR  Radiological Exams on Admission: I have personally reviewed images CT CHEST ABDOMEN PELVIS W CONTRAST Result Date: 11/26/2024 CLINICAL DATA:  Sepsis. Breast cancer with history of liver and spinal metastasis. EXAM: CT CHEST, ABDOMEN, AND PELVIS WITH CONTRAST TECHNIQUE: Multidetector CT imaging of the chest, abdomen and pelvis was performed following the standard protocol during bolus administration of intravenous contrast. RADIATION DOSE REDUCTION: This exam was performed according to the departmental dose-optimization program which includes automated exposure control, adjustment of the mA and/or kV according to patient size and/or use of iterative reconstruction technique. CONTRAST:  OMNIPAQUE  IOHEXOL  300 MG/ML  SOLN COMPARISON:  10/18/2024, 04/30/2008. FINDINGS: CT CHEST FINDINGS Cardiovascular: The heart is normal in size and there is no pericardial effusion. The distal tip of a right chest port terminates at the cavoatrial junction. The aorta and pulmonary trunk are normal in  caliber. Mediastinum/Nodes: Enlarged lymph nodes are present in the mediastinum measuring up to 1.9 cm in the precarinal space. There are prominent lymph nodes in the hilar regions bilaterally. Lungs/Pleura: Paraseptal and centrilobular emphysematous changes are noted in the lungs. There are scattered pulmonary nodules bilaterally, increased in size and number from the prior exam. For example there is an 8 mm nodule in the posterior segment of the left upper lobe, axial image 37, new from the previous exam. Subpleural consolidation is noted in the anterior aspect of the left upper lobe with communication of soft tissue fullness at the left hilum and, increased from the prior exam. Strandy opacities are noted at the lung bases, possible atelectasis, scarring, or infiltrate. No effusion or pneumothorax is seen. Musculoskeletal: Enlarged lymph nodes  are noted in the left chest wall. Surgical clips in a small fluid collection is present in the left axilla measuring 2.6 cm, possible seroma and unchanged from the previous exam. Sclerotic lesions are noted in the thoracic spine, compatible with known metastatic disease. There are mild compression deformities in the superior endplates at T9 and T11, likely present on the prior exam. CT ABDOMEN PELVIS FINDINGS Hepatobiliary: Innumerable hypodense masses are present in the liver, the largest in the left lobe measuring 6.3 cm and increased from the prior exam. No biliary ductal dilatation. The gallbladder is without stones. There suggestion of gallbladder wall thickening, which may be related to local inflammatory changes in the liver. Pancreas: Unremarkable. No pancreatic ductal dilatation or surrounding inflammatory changes. Spleen: Normal in size without focal abnormality. Adrenals/Urinary Tract: The adrenal glands are stable. The kidneys enhance symmetrically. No renal calculus or hydronephrosis. The bladder is unremarkable. Stomach/Bowel: There is a small hiatal hernia. The stomach is otherwise within normal limits. No bowel obstruction, free air, or pneumatosis is seen. Appendix appears normal. Vascular/Lymphatic: No significant vascular findings are present. Enlarged lymph nodes are seen in the gastrohepatic ligament and porta hepatis. Reproductive: Status post hysterectomy. No adnexal masses. Other: No abdominopelvic ascites. A fat containing periumbilical hernia is present. Musculoskeletal: Degenerative changes are present in the lumbar spine. A sclerotic region is present in the proximal sacrum, possible metastatic disease. No acute fracture is seen. IMPRESSION: 1. Left upper lobe consolidation extending to the left hilum with strandy opacities in the lungs bilaterally, possible infiltrate, scarring, or worsening neoplastic process. 2. Interval increase in number and size of hepatic metastasis and pulmonary  nodules. 3. Enlargement of mediastinal, hilar, and upper abdominal lymph adenopathy, likely representing metastatic disease. 4. Osteoblastic metastatic disease. Electronically Signed   By: Leita Birmingham M.D.   On: 11/26/2024 15:44   DG Chest Port 1 View if patient is in a treatment room. Result Date: 11/26/2024 EXAM: 1 VIEW(S) XRAY OF THE CHEST 11/26/2024 01:02:00 PM COMPARISON: None available. CLINICAL HISTORY: Suspected Sepsis FINDINGS: LINES, TUBES AND DEVICES: Right chest Port-A-Cath in place with tip projecting over the right atrium approximately 2 cm distal to the superior cavoatrial junction. LUNGS AND PLEURA: Spontaneous densities overlying the bilateral lower lung zones are likely external to the patient. Band-like opacity at right lung base. No pleural effusion. No pneumothorax. HEART AND MEDIASTINUM: No acute abnormality of the cardiac and mediastinal silhouettes. BONES AND SOFT TISSUES: Left axillary surgical clips noted. No acute osseous abnormality. IMPRESSION: 1. Band-like opacity at the right lung base, favor subsegmental atelectasis. 2. Right chest Port-A-Cath tip projects in the right atrium, approximately 2 cm distal to the superior cavoatrial junction. Electronically signed by: Morgane Naveau MD 11/26/2024 01:10 PM  EST RP Workstation: [id number]    EKG: My personal interpretation of EKG shows: EKG with sinus rhythm without any acute ST changes.     Assessment/Plan Principal Problem:   Sepsis due to pneumonia [facility]) Active Problems:   Cancer of right breast metastatic to brain [facility])   Essential hypertension  Patient with generalized weakness, coughing, hypoxia on room air, meeting SIRS criteria, along with lactic acidosis admitted for sepsis.  Imaging showed increasing pulmonary nodules along with concerning opacities that may be concerning for an infiltrate.  Given patient's high risk, will treat patient for pneumonia.  Will continue with CAP coverage.  Her lactic acid may be  secondary to hepatic metastasis as she may have a hard time doing lactic acid.  Blood cultures have been ordered and will follow up.  Will monitor leukocyte count, monitor fever curve.  Will add on a procalcitonin.  Metastatic breast cancer: Patient with metastatic breast cancer that appears to be worsening.  Patient is very optimistic regarding her cancer treatment.  Did discuss that her cancer treatment was palliative in nature which patient appears to not have known.  She states her abdominal and back pain are improved after getting medication in the ED.  Suspect her pain is secondary to metastasis.  Will consult oncology in setting of worsening image findings along with palliative care as she may need pain control.  Patient is agreeable with palliative care consultation.  Hypertension: Outpatient regimen is HCTZ.  Hold given concern for active infection and sepsis.   VTE prophylaxis:  SCDs  Diet: Regular Code Status:  Full Code Telemetry:  Admission status: Inpatient, Progressive Patient is from: Home Anticipated d/c is to: Home Anticipated d/c is in: 2-3 days   Family Communication: Updated at bedside  Consults called: Oncology, Palliative care   Severity of Illness: The appropriate patient status for this patient is INPATIENT. Inpatient status is judged to be reasonable and necessary in order to provide the required intensity of service to ensure the patient's safety. The patient's presenting symptoms, physical exam findings, and initial radiographic and laboratory data in the context of their chronic comorbidities is felt to place them at high risk for further clinical deterioration. Furthermore, it is not anticipated that the patient will be medically stable for discharge from the hospital within 2 midnights of admission.   * I certify that at the point of admission it is my clinical judgment that the patient will require inpatient hospital care spanning beyond 2 midnights from the  point of admission due to high intensity of service, high risk for further deterioration and high frequency of surveillance required.DEWAINE Morene Bathe, MD [facility] DEL. [facility]     [1] No Known Allergies  "

## 2024-11-26 NOTE — Sepsis Progress Note (Signed)
 Sepsis protocol monitored by eLink

## 2024-11-26 NOTE — ED Notes (Signed)
 Port needed to be accessed. Due to accessing, delay in meds.

## 2024-11-26 NOTE — ED Notes (Signed)
 1st set of cultures and lactic drawn at this time.

## 2024-11-26 NOTE — Progress Notes (Signed)
 CODE SEPSIS - PHARMACY COMMUNICATION  **Broad Spectrum Antibiotics should be administered within 1 hour of Sepsis diagnosis**  Time Code Sepsis Called/Page Received: 12:52  Antibiotics Ordered: Cefepime , vancomycin , flagyl   Time of 1st antibiotic administration: 13:33  Additional action taken by pharmacy: N/A  If necessary, Name of Provider/Nurse Contacted: N/A    Ransom Blanch PGY-1 Pharmacy Resident  [place] [facility]  11/26/2024 1:03 PM

## 2024-11-26 NOTE — ED Notes (Signed)
 This tech answered the pt call light. Pt stated she had a piss attack all on the floor. This tech disposed of dirty linens appropriately. Pt and family told me I could throw away the wet clothes on the floor. After confirming 2x with pt and family, this tech threw away one pair of orange sweat pants and one pair of grey socks. Pt pink zip up hooded jacket was placed in a pt belonging bag.

## 2024-11-26 NOTE — ED Provider Notes (Signed)
 49 year old female with history of breast cancer presenting with concern of sepsis.  Upon arrival tachycardic with elevated lactic acid which may partially be from liver cirrhosis in the setting of mets to the liver, but not entirely clear.  She clinically appeared unwell upon arrival, was given broad-spectrum antibiotics and adequate fluid resuscitation she presented with symptoms of shortness of breath and CT imaging demonstrates consolidation in the left upper lobe of the lung concerning for possible infection versus worsening malignancy.  Given the clinical picture reasonable to treat for pneumonia, will reach out to hospitalist service for anticipated admission at this time.  Spoke with Dr. Fernand from medicine service who has agreed to evaluate the patient to determine course of further medical management at this time.  My independent interpretation of this EKG reveals a sinus rhythm with rate of about 130, axis of 210, intervals appear to be within normal limits, no obvious ischemia that I appreciate on this EKG   Fernand Rossie HERO, MD 11/26/24 2240

## 2024-11-26 NOTE — ED Notes (Addendum)
 This tech saw that handoff was completed and the assigned room for the pt was ready. Pt moved to 2A room 251 and was accepted, despite handoff completion being retracted.

## 2024-11-26 NOTE — ED Triage Notes (Signed)
 Pt to ED POV for SOB since last few days. Pt has breast cancer, last chemo 4 days ago. Pt also having abdominal bloating and upper abdominal pain since last night. Also lower back pain. Pt was placed on 2L in triage because SPO2 90% on RA. Pt is tachypneic.

## 2024-11-26 NOTE — ED Provider Notes (Signed)
 "  [facility] Provider Note   Event Date/Time   First MD Initiated Contact with Patient 11/26/24 1246     (approximate) History  Shortness of Breath, Abdominal Pain, and Back Pain  HPI Tara Hanna is a 49 y.o. female with a stated past medical history of breast cancer with metastasis to the brain, bone, and liver who presents via EMS complaining of abdominal distention/pain in the upper abdomen, dyspnea on exertion with shortness of breath at rest, and fatigue.  Patient states that the symptoms have been worsening over the last 2 days.  Patient denies any recent travel, sick contacts, or food out of the ordinary.  Patient states last chemotherapeutic infusion was doxorubicin  on 11/22/2024. ROS: Patient currently denies any vision changes, tinnitus, difficulty speaking, facial droop, sore throat, chest pain, nausea/vomiting/diarrhea, dysuria, or weakness/numbness/paresthesias in any extremity   Physical Exam  Triage Vital Signs: ED Triage Vitals  Encounter Vitals Group     BP 11/26/24 1244 90/70     Girls Systolic BP Percentile --      Girls Diastolic BP Percentile --      Boys Systolic BP Percentile --      Boys Diastolic BP Percentile --      Pulse Rate 11/26/24 1244 (!) 136     Resp 11/26/24 1244 (!) 24     Temp 11/26/24 1244 98.7 F (37.1 C)     Temp src --      SpO2 11/26/24 1244 97 %     Weight 11/26/24 1242 218 lb 4.1 oz (99 kg)     Height 11/26/24 1242 5' 4 (1.626 m)     Head Circumference --      Peak Flow --      Pain Score 11/26/24 1241 9     Pain Loc --      Pain Education --      Exclude from Growth Chart --    Most recent vital signs: Vitals:   11/26/24 1244  BP: 90/70  Pulse: (!) 136  Resp: (!) 24  Temp: 98.7 F (37.1 C)  SpO2: 97%   General: Awake, oriented x4. CV:  Good peripheral perfusion. Resp:  Normal effort. Abd:  No distention.  Mild upper abdominal tenderness to palpation Other:  Middle-aged obese African-American  female resting comfortably in no acute distress ED Results / Procedures / Treatments  Labs (all labs ordered are listed, but only abnormal results are displayed) Labs Reviewed  CULTURE, BLOOD (ROUTINE X 2)  CULTURE, BLOOD (ROUTINE X 2)  RESP PANEL BY RT-PCR (RSV, FLU A&B, COVID)  [id number]  COMPREHENSIVE METABOLIC PANEL WITH GFR  LACTIC ACID, PLASMA  LACTIC ACID, PLASMA  CBC WITH DIFFERENTIAL/PLATELET  PROTIME-INR  URINALYSIS, W/ REFLEX TO CULTURE (INFECTION SUSPECTED)   PROCEDURES: Critical Care performed: No Procedures MEDICATIONS ORDERED IN ED: Medications - No data to display IMPRESSION / MDM / ASSESSMENT AND PLAN / ED COURSE  I reviewed the triage vital signs and the nursing notes.                             The patient is on the cardiac monitor to evaluate for evidence of arrhythmia and/or significant heart rate changes. Patient's presentation is most consistent with acute presentation with potential threat to life or bodily function. Patient is a 49 year old female with the above-stated past medical history presents complaining of abdominal pain, shortness of breath, and subjective fever in the  setting of of active cancer currently being treated on chemotherapy DDx: Neutropenic fever, pneumonia, UTI, intra-abdominal hemorrhage, mechanical obstruction in biliary tree Plan: Sepsis order set initiated IVF: 30 cc/kg LR ABX: Rocephin , vancomycin , Flagyl   Care of this patient will be signed out to the oncoming physician at the end of my shift.  All pertinent patient information conveyed and all questions answered.  All further care and disposition decisions will be made by the oncoming physician.   FINAL CLINICAL IMPRESSION(S) / ED DIAGNOSES   Final diagnoses:  None   Rx / DC Orders   ED Discharge Orders     None      Note:  This document was prepared using Dragon voice recognition software and may include unintentional dictation errors.   Janya Eveland K, MD 11/26/24  1517  "

## 2024-11-27 DIAGNOSIS — J189 Pneumonia, unspecified organism: Secondary | ICD-10-CM

## 2024-11-27 DIAGNOSIS — Z789 Other specified health status: Secondary | ICD-10-CM

## 2024-11-27 DIAGNOSIS — C50911 Malignant neoplasm of unspecified site of right female breast: Secondary | ICD-10-CM

## 2024-11-27 DIAGNOSIS — Z7189 Other specified counseling: Secondary | ICD-10-CM | POA: Diagnosis not present

## 2024-11-27 DIAGNOSIS — A419 Sepsis, unspecified organism: Secondary | ICD-10-CM

## 2024-11-27 DIAGNOSIS — Z515 Encounter for palliative care: Secondary | ICD-10-CM | POA: Diagnosis not present

## 2024-11-27 DIAGNOSIS — C7981 Secondary malignant neoplasm of breast: Secondary | ICD-10-CM | POA: Diagnosis not present

## 2024-11-27 DIAGNOSIS — I10 Essential (primary) hypertension: Secondary | ICD-10-CM

## 2024-11-27 DIAGNOSIS — C7931 Secondary malignant neoplasm of brain: Secondary | ICD-10-CM | POA: Diagnosis not present

## 2024-11-27 LAB — CBC
HCT: 31.7 % — ABNORMAL LOW (ref 36.0–46.0)
Hemoglobin: 10.4 g/dL — ABNORMAL LOW (ref 12.0–15.0)
MCH: 24.1 pg — ABNORMAL LOW (ref 26.0–34.0)
MCHC: 32.8 g/dL (ref 30.0–36.0)
MCV: 73.5 fL — ABNORMAL LOW (ref 80.0–100.0)
Platelets: 61 K/uL — ABNORMAL LOW (ref 150–400)
RBC: 4.31 MIL/uL (ref 3.87–5.11)
RDW: 21.5 % — ABNORMAL HIGH (ref 11.5–15.5)
WBC: 5.5 K/uL (ref 4.0–10.5)
nRBC: 5.6 % — ABNORMAL HIGH (ref 0.0–0.2)

## 2024-11-27 LAB — BASIC METABOLIC PANEL WITH GFR
Anion gap: 16 — ABNORMAL HIGH (ref 5–15)
BUN: 10 mg/dL (ref 6–20)
CO2: 21 mmol/L — ABNORMAL LOW (ref 22–32)
Calcium: 8.3 mg/dL — ABNORMAL LOW (ref 8.9–10.3)
Chloride: 96 mmol/L — ABNORMAL LOW (ref 98–111)
Creatinine, Ser: 0.73 mg/dL (ref 0.44–1.00)
GFR, Estimated: >60 mL/min
Glucose, Bld: 163 mg/dL — ABNORMAL HIGH (ref 70–99)
Potassium: 4.1 mmol/L (ref 3.5–5.1)
Sodium: 133 mmol/L — ABNORMAL LOW (ref 135–145)

## 2024-11-27 LAB — LACTIC ACID, PLASMA: Lactic Acid, Venous: 3.5 mmol/L (ref 0.5–1.9)

## 2024-11-27 LAB — GLUCOSE, CAPILLARY
Glucose-Capillary: 148 mg/dL — ABNORMAL HIGH (ref 70–99)
Glucose-Capillary: 117 mg/dL — ABNORMAL HIGH (ref 70–99)
Glucose-Capillary: 135 mg/dL — ABNORMAL HIGH (ref 70–99)

## 2024-11-27 LAB — D-DIMER, QUANTITATIVE: D-Dimer, Quant: >20 ug{FEU}/mL — ABNORMAL HIGH (ref 0.00–0.50)

## 2024-11-27 LAB — HIV ANTIBODY (ROUTINE TESTING W REFLEX): HIV Screen 4th Generation wRfx: NONREACTIVE

## 2024-11-27 MED ORDER — ALPRAZOLAM 0.25 MG PO TABS
0.2500 mg | ORAL_TABLET | Freq: Every day | ORAL | Status: DC | PRN
  Administered 2024-11-27 – 2024-11-29 (×3): 0.25 mg via ORAL
  Filled 2024-11-27 (×3): qty 1

## 2024-11-27 MED ORDER — METOPROLOL TARTRATE 5 MG/5ML IV SOLN
5.0000 mg | Freq: Once | INTRAVENOUS | Status: AC | PRN
  Administered 2024-11-28: 5 mg via INTRAVENOUS
  Filled 2024-11-27: qty 5

## 2024-11-27 MED ORDER — LEVETIRACETAM 500 MG PO TABS
500.0000 mg | ORAL_TABLET | Freq: Two times a day (BID) | ORAL | Status: DC
  Administered 2024-11-27 – 2024-12-05 (×18): 500 mg via ORAL
  Filled 2024-11-27 (×18): qty 1

## 2024-11-27 NOTE — Consult Note (Signed)
 "                                                                                   Consultation Note Date: 11/27/2024 at 0829 Reason for consultation: Pt with metatastatic cancer with worsening metastasis having worsening pain and not on any pain medication. Requesting palliative care consult.    Patient Name: Tara Hanna  DOB: 02-Feb-1976  MRN: [id number]  Age / Sex: 49 y.o., female  PCP: System, Provider Not In Referring Physician: Jens Durand, MD  HPI/Patient Profile: 48 y.o. female  with past medical history significant for HTN and metastatic breast cancer with metastasis to liver, brain and bone on chemotherapy. Patient presented to ED 11/26/2024 from home c/o abdominal distention/pain, back pain and DOE with shortness of breath at rest. Patient reports last chemo was 11/22/24.   ED labs significant for anion gap 17, Alk phos 759, albumin 3.4, AST 617, ALT 503. Lactic acid 4.4-->3.2-->3.6-->3.5.  Hgb 10.4, Hct 31.7, plts 61. D dimer >20  ED vitals 90/70, HR 136, SpO2 97% 2L, RR 24, 98.66F  CTA chest demonstrated: 1. No evidence of pulmonary embolism. 2. Stable left upper lobe nodular airspace disease and several small pulmonary nodules. 3. Stable bilateral hilar and mediastinal adenopathy. 4. 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 5. Numerous hepatic metastases, better seen on earlier CT.  [facility] was consulted for admission and management of severe sepsis 2/2 CAP, thrombocytopenia, anemia of chronic disease, elevated liver enzymes, hyponatremia and breast cancer with metastasis to liver, bone and brain.   Palliative medicine team was consulted for assistance with goals of care conversations.    Clinical Assessment and Goals of Care: Extensive chart review completed prior to meeting patient including labs, vital signs, imaging, progress notes, orders, and available advanced  directive documents from current and previous encounters. I then met with patient and husband to discuss diagnosis prognosis, GOC, EOL wishes, disposition and options.     Latest Ref Rng & Units 11/27/2024    3:49 AM 11/26/2024    1:02 PM 11/22/2024    8:15 AM  CBC  WBC 4.0 - 10.5 K/uL 5.5  6.0  7.9   Hemoglobin 12.0 - 15.0 g/dL 89.5  88.7  88.8   Hematocrit 36.0 - 46.0 % 31.7  34.8  34.8   Platelets 150 - 400 K/uL 61  87  133       Latest Ref Rng & Units 11/27/2024    3:49 AM 11/26/2024    1:02 PM 11/22/2024    8:15 AM  CMP  Glucose 70 - 99 mg/dL 836  896  881   BUN 6 - 20 mg/dL 10  11  10    Creatinine 0.44 - 1.00 mg/dL 9.26  9.31  9.29   Sodium 135 - 145 mmol/L 133  138  143   Potassium 3.5 - 5.1 mmol/L 4.1  4.1  3.6   Chloride 98 - 111 mmol/L 96  97  104   CO2 22 - 32 mmol/L 21  24  22    Calcium 8.9 - 10.3 mg/dL 8.3  9.5  9.1   Total Protein 6.5 - 8.1 g/dL  7.0  6.6   Total Bilirubin 0.0 - 1.2 mg/dL  1.0  0.7   Alkaline Phos 38 - 126 U/L  759  546   AST 15 - 41 U/L  617  180   ALT 0 - 44 U/L  503  302    Ill-appearing female sitting upright in bed with husband at bedside. She is alert and oriented to self, time, location and situation. She is able to participate in conversation making her wished known. She is in no distress.   I introduced Palliative Medicine as specialized medical care for people living with serious illness. It focuses on providing relief from the symptoms and stress of a serious illness. The goal is to improve quality of life for both the patient and the family.  We discussed a brief life review of the patient. Tara Hanna and her husband Burnetta have 4 children and 7 grandchildren. She currently works as a scientist, research (medical).   As far as functional and nutritional status, Tara Hanna is independent of her ADLs and continues to work during her treatments. She has normal appetite with no weight loss. She reports loss of appetite with this most recent illness. She has not eaten since  her admission.   We discussed patient's current illness and what it means in the larger context of patient's on-going co-morbidities.  Natural disease trajectory and expectations at EOL were discussed. Both Tara Hanna and Burnetta understand that her prognosis is not good due to extensive metastasis of her cancer. Shared recent scan that identifies increase in metastasis to liver and multiple LN sites. They are both very upset with this information. Tara Hanna shares she was under the impression that her cancer was controlled with her treatments. Burnetta states since she did not have treatment for 2 months during transition from [facility] to [facility] cancer center, that is the reason for worsening. Tara Hanna shares she was forced to change from [facility] to [facility] cancer center due to insurance reasons.    I attempted to elicit values and goals of care important to the patient. Tara Hanna shares her short term goal is to go home. Long term goal is to  beat this cancer.   The difference between aggressive medical intervention and comfort care was considered in light of the patient's goals of care.   Advance directives, concepts specific to code status, artificial feeding and hydration, and rehospitalization were considered and discussed. Patient and husband adamantly confirm that she remains full code/full scope care. However, she shares that she would not want to be on a ventilator long term. She would be accepting of trach/PEG if it were to be short term, but not to live on machines for an extended amount of time.   Patient remains FULL CODE/FULL SCOPE care.  Patient declined spiritual support for completion of HPOA at this time.   Education offered regarding concept specific to human mortality and the limitations of medical interventions to prolong life when the body begins to fail to thrive.  Family is facing treatment option decisions, advanced directive, and anticipatory care needs. Tara Hanna shares that her husband, Burnetta, is her  surrogate decision maker with her mother being second surrogate decision maker should Burnetta not be available.   Discussed with patient/family the importance of continued conversation with family and the medical providers regarding overall plan of care and treatment options, ensuring decisions are within the context of the patients values and GOCs.  Tara Hanna and Burnetta are  agreeable to follow up with [name] for OP palliative management at the cancer center.   Questions and concerns were addressed. The family was encouraged to call with questions or concerns.   Primary Decision Maker PATIENT  Physical Exam Vitals reviewed.  Constitutional:      General: She is not in acute distress.    Appearance: She is ill-appearing.  HENT:     Head: Normocephalic and atraumatic.     Mouth/Throat:     Mouth: Mucous membranes are moist.  Eyes:     Comments: L eyelid droop  Pulmonary:     Effort: Pulmonary effort is normal. No respiratory distress.  Musculoskeletal:     Right lower leg: No edema.     Left lower leg: No edema.  Skin:    General: Skin is warm and dry.     Comments: Port to R upper chest  Neurological:     Mental Status: She is alert and oriented to person, place, and time.  Psychiatric:        Mood and Affect: Mood normal.        Behavior: Behavior normal.        Thought Content: Thought content normal.        Judgment: Judgment normal.   Recommendations/Plan: FULL CODE status as previously documented    Continue current supportive interventions Provide anxiety/pain medication as needed Follow up with [facility] for outpatient palliative care Per note, plan to d/c home when medically stable  Palliative Assessment/Data: 80-90%     Thank you for this consult. Palliative medicine will continue to follow and assist holistically.   Time Total: 90 minutes  Discussed plan of care and medication management with Dr. Jens  Time spent includes: Detailed review of  medical records (labs, imaging, vital signs), medically appropriate exam (mental status, respiratory, cardiac, skin), discussed with treatment team, counseling and educating patient, family and staff, documenting clinical information, medication management and coordination of care.     Devere Sacks, ELNITA- [facility] Palliative Medicine Team  11/27/2024 8:29 AM  Office [phone]  Pager [phone]     Please contact Palliative Medicine Team providers via AMION for questions and concerns.   "

## 2024-11-27 NOTE — Plan of Care (Signed)

## 2024-11-27 NOTE — Progress Notes (Signed)
 Assumed care of patient for dayshift.  She has been in RED [facility] all night long.  HR currently in 120's (ST).  No palpitations, no complaints of pain.

## 2024-11-27 NOTE — Progress Notes (Addendum)
 "    Progress Note    Tara Hanna  FMW:[id number] DOB: 16-Nov-1976  DOA: 11/26/2024 PCP: System, Provider Not In      Brief Narrative:    Medical records reviewed and are as summarized below:  Tara Hanna is a 49 y.o. female  with medical history of HTN, metastatic breast cancer with metastasis to liver, brain and bone on chemotherapy (last chemo about 4 days prior to admission).  Breast cancer was diagnosed in 2023 and she has recurrent and metastatic disease on palliative chemotherapy with doxorubicin  every 28 days.  Last chemo was on 11/22/2024.  She presented to the hospital with shortness of breath, abdominal and back pain.   She was diagnosed with possible severe sepsis secondary to community-acquired pneumonia.  CT chest abdomen and pelvis IMPRESSION: 1. Left upper lobe consolidation extending to the left hilum with strandy opacities in the lungs bilaterally, possible infiltrate, scarring, or worsening neoplastic process. 2. Interval increase in number and size of hepatic metastasis and pulmonary nodules. 3. Enlargement of mediastinal, hilar, and upper abdominal lymph adenopathy, likely representing metastatic disease. 4. Osteoblastic metastatic disease.     Assessment/Plan:   Principal Problem:   Sepsis due to pneumonia [facility]) Active Problems:   Cancer of right breast metastatic to brain [facility])   Essential hypertension    Body mass index is 37.46 kg/m.  Open (class II obesity)    Severe sepsis secondary to community-acquired pneumonia, sinus tachycardia: Continue IV ceftriaxone  and azithromycin .  Discontinue IV fluids later today.  Follow-up blood cultures. Lactic acid down from 4.4-3.5.   Anemia of chronic disease: Possibly due to malignancy.  Recent chemotherapy could be contributing to anemia as well.  Thrombocytopenia: Likely due to recent chemotherapy.  Monitor CBC   Breast cancer with metastatic disease to the liver, brain, bone: Continue  Keppra  for seizure prophylaxis. Follow-up with oncologist and palliative care team.   Elevated liver enzymes: Likely due to metastatic liver disease.   Hyponatremia: Mild and asymptomatic.  Repeat BMP tomorrow.   Hypertension: HCTZ on hold.    Diet Order             Diet regular Room service appropriate? Yes; Fluid consistency: Thin  Diet effective now                                  Consultants: Oncologist Palliative care  Procedures: None    Medications:    Chlorhexidine  Gluconate Cloth  6 each Topical Daily   heparin   5,000 Units Subcutaneous Q8H   sodium chloride  flush  10-40 mL Intracatheter Q12H   Continuous Infusions:  azithromycin  500 mg (11/27/24 0041)   cefTRIAXone  (ROCEPHIN )  IV 2 g (11/27/24 0048)   lactated ringers  Stopped (11/26/24 1849)   lactated ringers  100 mL/hr at 11/27/24 0032     Anti-infectives (From admission, onward)    Start     Dose/Rate Route Frequency Ordered Stop   11/26/24 2015  cefTRIAXone  (ROCEPHIN ) 2 g in sodium chloride  0.9 % 100 mL IVPB        2 g 200 mL/hr over 30 Minutes Intravenous Every 24 hours 11/26/24 2006 12/01/24 2014   11/26/24 2015  azithromycin  (ZITHROMAX ) 500 mg in sodium chloride  0.9 % 250 mL IVPB        500 mg 250 mL/hr over 60 Minutes Intravenous Every 24 hours 11/26/24 2006 11/29/24 2014   11/26/24 1300  ceFEPIme  (MAXIPIME ) 2 g  in sodium chloride  0.9 % 100 mL IVPB        2 g 200 mL/hr over 30 Minutes Intravenous  Once 11/26/24 1252 11/26/24 1525   11/26/24 1300  metroNIDAZOLE  (FLAGYL ) IVPB 500 mg        500 mg 100 mL/hr over 60 Minutes Intravenous  Once 11/26/24 1252 11/26/24 1433   11/26/24 1300  vancomycin  (VANCOCIN ) IVPB 1000 mg/200 mL premix        1,000 mg 200 mL/hr over 60 Minutes Intravenous  Once 11/26/24 1252 11/26/24 1630              Family Communication/Anticipated D/C date and plan/Code Status   DVT prophylaxis: heparin  injection 5,000 Units Start: 11/26/24  2200 SCDs Start: 11/26/24 1706     Code Status: Full Code  Family Communication: Plan discussed with the husband at the bedside Disposition Plan: Plan to discharge home   Status is: Observation The patient will require care spanning > 2 midnights and should be moved to inpatient because: Sepsis secondary to pneumonia       Subjective:   Interval events noted.  She complains of shortness of breath.  It is better compared to yesterday.  No pain reported.  Husband at the bedside.  Objective:    Vitals:   11/27/24 0200 11/27/24 0300 11/27/24 0400 11/27/24 0500  BP: (!) 117/91 104/86 (!) 115/93 (!) 123/100  Pulse:   (!) 176 (!) 116  Resp: (!) 27 (!) 25 (!) 21 (!) 27  Temp:    99.3 F (37.4 C)  TempSrc:    Oral  SpO2:   100% 97%  Weight:      Height:       No data found.   Intake/Output Summary (Last 24 hours) at 11/27/2024 0841 Last data filed at 11/26/2024 1948 Gross per 24 hour  Intake 3697.33 ml  Output --  Net 3697.33 ml   Filed Weights   11/26/24 1242  Weight: 99 kg    Exam:  GEN: NAD SKIN: Warm and dry EYES: No pallor or icterus ENT: MMM CV: RRR, tachycardic PULM: CTA B ABD: soft, ND, NT, +BS CNS: AAO x 3, non focal EXT: No edema or tenderness        Data Reviewed:   I have personally reviewed following labs and imaging studies:  Labs: Labs show the following:   Basic Metabolic Panel: Recent Labs  Lab 11/22/24 0815 11/26/24 1302 11/27/24 0349  NA 143 138 133*  K 3.6 4.1 4.1  CL 104 97* 96*  CO2 22 24 21*  GLUCOSE 118* 103* 163*  BUN 10 11 10   CREATININE 0.70 0.68 0.73  CALCIUM 9.1 9.5 8.3*  MG  --  1.8  --    GFR Estimated Creatinine Clearance: 97.2 mL/min (by C-G formula based on SCr of 0.73 mg/dL). Liver Function Tests: Recent Labs  Lab 11/22/24 0815 11/26/24 1302  AST 180* 617*  ALT 302* 503*  ALKPHOS 546* 759*  BILITOT 0.7 1.0  PROT 6.6 7.0  ALBUMIN 3.3* 3.4*   No results for input(s): LIPASE, AMYLASE in  the last 168 hours. No results for input(s): AMMONIA in the last 168 hours. Coagulation profile Recent Labs  Lab 11/26/24 1302  INR 1.0    CBC: Recent Labs  Lab 11/22/24 0815 11/26/24 1302 11/27/24 0349  WBC 7.9 6.0 5.5  NEUTROABS 6.0 5.0  --   HGB 11.1* 11.2* 10.4*  HCT 34.8* 34.8* 31.7*  MCV 75.3* 74.2* 73.5*  PLT 133* 87*  61*   Cardiac Enzymes: No results for input(s): CKTOTAL, CKMB, CKMBINDEX, TROPONINI in the last 168 hours. BNP (last 3 results) No results for input(s): PROBNP in the last 8760 hours. CBG: No results for input(s): GLUCAP in the last 168 hours. D-Dimer: Recent Labs    11/26/24 2237  DDIMER >20.00*   Hgb A1c: No results for input(s): HGBA1C in the last 72 hours. Lipid Profile: No results for input(s): CHOL, HDL, LDLCALC, TRIG, CHOLHDL, LDLDIRECT in the last 72 hours. Thyroid  function studies: No results for input(s): TSH, T4TOTAL, T3FREE, THYROIDAB in the last 72 hours.  Invalid input(s): FREET3 Anemia work up: No results for input(s): VITAMINB12, FOLATE, FERRITIN, TIBC, IRON, RETICCTPCT in the last 72 hours. Sepsis Labs: Recent Labs  Lab 11/22/24 0815 11/26/24 1302 11/26/24 1457 11/26/24 2237 11/27/24 0349  PROCALCITON  --   --   --  1.52  --   WBC 7.9 6.0  --   --  5.5  LATICACIDVEN  --  4.4* 3.2* 3.6* 3.5*    Microbiology Recent Results (from the past 240 hours)  Culture, blood (Routine x 2)     Status: None (Preliminary result)   Collection Time: 11/26/24  1:01 PM   Specimen: BLOOD  Result Value Ref Range Status   Specimen Description BLOOD RIGHT ANTECUBITAL  Final   Special Requests   Final    BOTTLES DRAWN AEROBIC AND ANAEROBIC Blood Culture results may not be optimal due to an inadequate volume of blood received in culture bottles   Culture   Final    NO GROWTH < 24 HOURS Performed at [facility], [address] [place], [place] [address]    Report Status  PENDING  Incomplete  Resp panel by RT-PCR (RSV, Flu A&B, Covid) Anterior Nasal Swab     Status: None   Collection Time: 11/26/24  1:30 PM   Specimen: Anterior Nasal Swab  Result Value Ref Range Status   SARS Coronavirus 2 by RT PCR NEGATIVE NEGATIVE Final    Comment: (NOTE) SARS-CoV-2 target nucleic acids are NOT DETECTED.  The SARS-CoV-2 RNA is generally detectable in upper respiratory specimens during the acute phase of infection. The lowest concentration of SARS-CoV-2 viral copies this assay can detect is 138 copies/mL. A negative result does not preclude SARS-Cov-2 infection and should not be used as the sole basis for treatment or other patient management decisions. A negative result may occur with  improper specimen collection/handling, submission of specimen other than nasopharyngeal swab, presence of viral mutation(s) within the areas targeted by this assay, and inadequate number of viral copies(<138 copies/mL). A negative result must be combined with clinical observations, patient history, and epidemiological information. The expected result is Negative.  Fact Sheet for Patients:  bloggercourse.com  Fact Sheet for Healthcare Providers:  seriousbroker.it  This test is no t yet approved or cleared by the [place]  FDA and  has been authorized for detection and/or diagnosis of SARS-CoV-2 by FDA under an Emergency Use Authorization (EUA). This EUA will remain  in effect (meaning this test can be used) for the duration of the COVID-19 declaration under Section 564(b)(1) of the Act, 21 U.S.C.section 360bbb-3(b)(1), unless the authorization is terminated  or revoked sooner.       Influenza A by PCR NEGATIVE NEGATIVE Final   Influenza B by PCR NEGATIVE NEGATIVE Final    Comment: (NOTE) The Xpert Xpress SARS-CoV-2/FLU/RSV plus assay is intended as an aid in the diagnosis of influenza from Nasopharyngeal swab specimens  and  should not be used as a sole basis for treatment. Nasal washings and aspirates are unacceptable for Xpert Xpress SARS-CoV-2/FLU/RSV testing.  Fact Sheet for Patients: bloggercourse.com  Fact Sheet for Healthcare Providers: seriousbroker.it  This test is not yet approved or cleared by the [place]  FDA and has been authorized for detection and/or diagnosis of SARS-CoV-2 by FDA under an Emergency Use Authorization (EUA). This EUA will remain in effect (meaning this test can be used) for the duration of the COVID-19 declaration under Section 564(b)(1) of the Act, 21 U.S.C. section 360bbb-3(b)(1), unless the authorization is terminated or revoked.     Resp Syncytial Virus by PCR NEGATIVE NEGATIVE Final    Comment: (NOTE) Fact Sheet for Patients: bloggercourse.com  Fact Sheet for Healthcare Providers: seriousbroker.it  This test is not yet approved or cleared by the [place]  FDA and has been authorized for detection and/or diagnosis of SARS-CoV-2 by FDA under an Emergency Use Authorization (EUA). This EUA will remain in effect (meaning this test can be used) for the duration of the COVID-19 declaration under Section 564(b)(1) of the Act, 21 U.S.C. section 360bbb-3(b)(1), unless the authorization is terminated or revoked.  Performed at [facility], [address] [place], [place] [address]   Culture, blood (Routine x 2)     Status: None (Preliminary result)   Collection Time: 11/26/24  1:40 PM   Specimen: BLOOD  Result Value Ref Range Status   Specimen Description BLOOD BLOOD RIGHT HAND  Final   Special Requests   Final    BOTTLES DRAWN AEROBIC AND ANAEROBIC Blood Culture adequate volume   Culture   Final    NO GROWTH < 24 HOURS Performed at [facility], [address], [place], [place] [address]    Report Status PENDING  Incomplete     Procedures and diagnostic studies:  CT Angio Chest Pulmonary Embolism (PE) W or WO Contrast Result Date: 11/26/2024 EXAM: CTA CHEST 11/26/2024 11:37:29 PM TECHNIQUE: CTA of the chest was performed without and with the administration of 75 mL of iohexol  (OMNIPAQUE ) 350 MG/ML injection. Multiplanar reformatted images are provided for review. MIP images are provided for review. Automated exposure control, iterative reconstruction, and/or weight based adjustment of the mA/kV was utilized to reduce the radiation dose to as low as reasonably achievable. COMPARISON: 11/26/2024 CLINICAL HISTORY: Pulmonary embolism (PE) suspected, high prob. FINDINGS: PULMONARY ARTERIES: Pulmonary arteries are adequately opacified for evaluation. No acute pulmonary embolus. Main pulmonary artery is normal in caliber. MEDIASTINUM: The heart and pericardium demonstrate no acute abnormality. There is no acute abnormality of the thoracic aorta. LYMPH NODES: Bilateral hilar and mediastinal adenopathy again noted, unchanged. LUNGS AND PLEURA: Left upper lobe nodular airspace disease as well as several small pulmonary nodules again noted, unchanged. Bibasilar linear scarring or atelectasis. No evidence of pleural effusion or pneumothorax. UPPER ABDOMEN: Numerous hepatic metastases, better seen on earlier CT. 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 No acute soft tissue abnormality. IMPRESSION: 1. No evidence of pulmonary embolism. 2. Stable left upper lobe nodular airspace disease and several small pulmonary nodules. 3. Stable bilateral hilar and mediastinal adenopathy. 4. 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 5. Numerous hepatic metastases, better seen on earlier CT. Electronically signed  by: Franky Crease MD 11/26/2024 11:45 PM EST RP  Workstation: [id number]   CT CHEST ABDOMEN PELVIS W CONTRAST Result Date: 11/26/2024 CLINICAL DATA:  Sepsis. Breast cancer with history of liver and spinal metastasis. EXAM: CT CHEST, ABDOMEN, AND PELVIS WITH CONTRAST TECHNIQUE: Multidetector CT imaging of the chest, abdomen and pelvis was performed following the standard protocol during bolus administration of intravenous contrast. RADIATION DOSE REDUCTION: This exam was performed according to the departmental dose-optimization program which includes automated exposure control, adjustment of the mA and/or kV according to patient size and/or use of iterative reconstruction technique. CONTRAST:  OMNIPAQUE  IOHEXOL  300 MG/ML  SOLN COMPARISON:  10/18/2024, 04/30/2008. FINDINGS: CT CHEST FINDINGS Cardiovascular: The heart is normal in size and there is no pericardial effusion. The distal tip of a right chest port terminates at the cavoatrial junction. The aorta and pulmonary trunk are normal in caliber. Mediastinum/Nodes: Enlarged lymph nodes are present in the mediastinum measuring up to 1.9 cm in the precarinal space. There are prominent lymph nodes in the hilar regions bilaterally. Lungs/Pleura: Paraseptal and centrilobular emphysematous changes are noted in the lungs. There are scattered pulmonary nodules bilaterally, increased in size and number from the prior exam. For example there is an 8 mm nodule in the posterior segment of the left upper lobe, axial image 37, new from the previous exam. Subpleural consolidation is noted in the anterior aspect of the left upper lobe with communication of soft tissue fullness at the left hilum and, increased from the prior exam. Strandy opacities are noted at the lung bases, possible atelectasis, scarring, or infiltrate. No effusion or pneumothorax is seen. Musculoskeletal: Enlarged lymph nodes are noted in the left chest wall. Surgical clips in a small fluid collection is  present in the left axilla measuring 2.6 cm, possible seroma and unchanged from the previous exam. Sclerotic lesions are noted in the thoracic spine, compatible with known metastatic disease. There are mild compression deformities in the superior endplates at T9 and T11, likely present on the prior exam. CT ABDOMEN PELVIS FINDINGS Hepatobiliary: Innumerable hypodense masses are present in the liver, the largest in the left lobe measuring 6.3 cm and increased from the prior exam. No biliary ductal dilatation. The gallbladder is without stones. There suggestion of gallbladder wall thickening, which may be related to local inflammatory changes in the liver. Pancreas: Unremarkable. No pancreatic ductal dilatation or surrounding inflammatory changes. Spleen: Normal in size without focal abnormality. Adrenals/Urinary Tract: The adrenal glands are stable. The kidneys enhance symmetrically. No renal calculus or hydronephrosis. The bladder is unremarkable. Stomach/Bowel: There is a small hiatal hernia. The stomach is otherwise within normal limits. No bowel obstruction, free air, or pneumatosis is seen. Appendix appears normal. Vascular/Lymphatic: No significant vascular findings are present. Enlarged lymph nodes are seen in the gastrohepatic ligament and porta hepatis. Reproductive: Status post hysterectomy. No adnexal masses. Other: No abdominopelvic ascites. A fat containing periumbilical hernia is present. Musculoskeletal: Degenerative changes are present in the lumbar spine. A sclerotic region is present in the proximal sacrum, possible metastatic disease. No acute fracture is seen. IMPRESSION: 1. Left upper lobe consolidation extending to the left hilum with strandy opacities in the lungs bilaterally, possible infiltrate, scarring, or worsening neoplastic process. 2. Interval increase in number and size of hepatic metastasis and pulmonary nodules. 3. Enlargement of mediastinal, hilar, and upper abdominal lymph  adenopathy, likely representing metastatic disease. 4. Osteoblastic metastatic disease. Electronically Signed   By: Leita Birmingham M.D.   On: 11/26/2024 15:44   DG Chest Port 1 View if patient is in a  treatment room. Result Date: 11/26/2024 EXAM: 1 VIEW(S) XRAY OF THE CHEST 11/26/2024 01:02:00 PM COMPARISON: None available. CLINICAL HISTORY: Suspected Sepsis FINDINGS: LINES, TUBES AND DEVICES: Right chest Port-A-Cath in place with tip projecting over the right atrium approximately 2 cm distal to the superior cavoatrial junction. LUNGS AND PLEURA: Spontaneous densities overlying the bilateral lower lung zones are likely external to the patient. Band-like opacity at right lung base. No pleural effusion. No pneumothorax. HEART AND MEDIASTINUM: No acute abnormality of the cardiac and mediastinal silhouettes. BONES AND SOFT TISSUES: Left axillary surgical clips noted. No acute osseous abnormality. IMPRESSION: 1. Band-like opacity at the right lung base, favor subsegmental atelectasis. 2. Right chest Port-A-Cath tip projects in the right atrium, approximately 2 cm distal to the superior cavoatrial junction. Electronically signed by: Morgane Naveau MD 11/26/2024 01:10 PM EST RP Workstation: [id number]               LOS: 0 days   Leanah Kolander  [facility]   Pager on [email or web address]. If 7PM-7AM, please contact night-coverage at [email or web address]     11/27/2024, 8:41 AM           "

## 2024-11-27 NOTE — Consult Note (Signed)
 "  Hematology/Oncology Consult note [facility] [phone] [phone]  Patient Care Team: System, Provider Not In as PCP - General Georgina Shasta POUR, RN as Oncology Nurse Navigator Rennie Cindy SAUNDERS, MD as Consulting Physician (Oncology)   Name of the patient: Tara Hanna  [id number]  Dec 29, 1975    Reason for referral-metastatic breast cancer   Referring physician-Dr. Aida Dings  Date of visit: 11/27/2024    History of presenting illness-patient is a 49 year old female with a history of metastatic triple negative breast cancer who has had progression on multiple lines of chemotherapy and sees Dr. Mardella as an outpatient.  She received cycle 1 of Doxil  chemotherapy on 11/22/2024.  On that day she was noted to have abnormal LFTs with AST ALT of 180 and 302 respectively with an alkaline phosphatase of 546.  Total bilirubin that was normal.  Patient was admitted to the hospital with symptoms of worsening abdominal pain and shortness of breath on exertion.  Respiratory panel was negative for COVID flu and RSV.  CT chest abdomen and pelvis with contrast was done which did not show any evidence of pulmonary embolism.    There was increase in the size and number of hepatic metastases and pulmonary nodules as well as mediastinal hilar and upper abdominal adenopathy.  ECOG PS- 3  Pain scale- 4   Review of systems- Review of Systems  Constitutional:  Positive for malaise/fatigue. Negative for chills, fever and weight loss.  HENT:  Negative for congestion, ear discharge and nosebleeds.   Eyes:  Negative for blurred vision.  Respiratory:  Positive for shortness of breath. Negative for cough, hemoptysis, sputum production and wheezing.   Cardiovascular:  Negative for chest pain, palpitations, orthopnea and claudication.  Gastrointestinal:  Positive for abdominal pain. Negative for blood in stool, constipation, diarrhea, heartburn, melena, nausea and  vomiting.  Genitourinary:  Negative for dysuria, flank pain, frequency, hematuria and urgency.  Musculoskeletal:  Negative for back pain, joint pain and myalgias.  Skin:  Negative for rash.  Neurological:  Negative for dizziness, tingling, focal weakness, seizures, weakness and headaches.  Endo/Heme/Allergies:  Does not bruise/bleed easily.  Psychiatric/Behavioral:  Negative for depression and suicidal ideas. The patient does not have insomnia.     Allergies[1]  Patient Active Problem List   Diagnosis Date Noted   Essential hypertension 11/26/2024   Sepsis due to pneumonia (HCC) 11/26/2024   Cancer of right breast metastatic to brain [facility]) 11/08/2024   Polysubstance abuse (HCC) 12/30/2017   Abnormal EKG 12/30/2017   Atypical chest pain 12/30/2017     Past Medical History:  Diagnosis Date   Breast cancer (HCC)    Fibroids    Hypertension      Past Surgical History:  Procedure Laterality Date   ABDOMINAL HYSTERECTOMY     BREAST LUMPECTOMY Left 2024    Social History   Socioeconomic History   Marital status: Married    Spouse name: Not on file   Number of children: Not on file   Years of education: Not on file   Highest education level: Not on file  Occupational History   Not on file  Tobacco Use   Smoking status: Former    Current packs/day: 1.00    Average packs/day: 1 pack/day for 22.0 years (22.0 ttl pk-yrs)    Types: Cigarettes   Smokeless tobacco: Never  Vaping Use   Vaping status: Never Used  Substance and Sexual Activity   Alcohol use: Not Currently    Alcohol/week:  2.0 standard drinks of alcohol    Types: 2 Shots of liquor per week   Drug use: Yes    Frequency: 2.0 times per week    Types: Marijuana   Sexual activity: Never  Other Topics Concern   Not on file  Social History Narrative   Not on file   Social Drivers of Health   Tobacco Use: Medium Risk (11/26/2024)   Patient History    Smoking Tobacco Use: Former    Smokeless Tobacco Use:  Never    Passive Exposure: Not on file  Financial Resource Strain: High Risk (03/21/2022)   Received from [facility]   Overall Financial Resource Strain (CARDIA)    Difficulty of Paying Living Expenses: Hard  Food Insecurity: No Food Insecurity (11/27/2024)   Epic    Worried About Radiation Protection Practitioner of Food in the Last Year: Never true    Ran Out of Food in the Last Year: Never true  Transportation Needs: No Transportation Needs (11/27/2024)   Epic    Lack of Transportation (Medical): No    Lack of Transportation (Non-Medical): No  Physical Activity: Not on file  Stress: Not on file  Social Connections: Not on file  Intimate Partner Violence: Not At Risk (11/27/2024)   Epic    Fear of Current or Ex-Partner: No    Emotionally Abused: No    Physically Abused: No    Sexually Abused: No  Depression (PHQ2-9): Low Risk (11/08/2024)   Depression (PHQ2-9)    PHQ-2 Score: 4  Alcohol Screen: Not on file  Housing: Unknown (11/27/2024)   Epic    Unable to Pay for Housing in the Last Year: No    Number of Times Moved in the Last Year: Not on file    Homeless in the Last Year: No  Utilities: Not At Risk (11/27/2024)   Epic    Threatened with loss of utilities: No  Recent Concern: Utilities - At Risk (11/08/2024)   Epic    Threatened with loss of utilities: Already shut off  Health Literacy: Not on file     Family History  Problem Relation Age of Onset   Diabetes Mother    Hypertension Mother    Prostate cancer Father    Clotting disorder Father    Cancer - Prostate Paternal Uncle        X3 BROTHERS   Heart failure Maternal Grandmother    Diabetes Maternal Grandmother     Current Medications[2]   Physical exam:  Vitals:   11/27/24 0200 11/27/24 0300 11/27/24 0400 11/27/24 0500  BP: (!) 117/91 104/86 (!) 115/93 (!) 123/100  Pulse:   (!) 176 (!) 116  Resp: (!) 27 (!) 25 (!) 21 (!) 27  Temp:    99.3 F (37.4 C)  TempSrc:    Oral  SpO2:   100% 97%  Weight:      Height:        Physical Exam Constitutional:      Comments: Appears fatigued. On 2L O2  Cardiovascular:     Rate and Rhythm: Regular rhythm. Tachycardia present.     Heart sounds: Normal heart sounds.  Pulmonary:     Comments: Effort of breathing increased. Breath sounds decreased over bases Abdominal:     General: Bowel sounds are normal.     Palpations: Abdomen is soft.     Comments: TTP RUQ  Skin:    General: Skin is warm and dry.  Neurological:     Mental Status: She  is alert and oriented to person, place, and time.           Latest Ref Rng & Units 11/27/2024    3:49 AM  CMP  Glucose 70 - 99 mg/dL 836   BUN 6 - 20 mg/dL 10   Creatinine 9.55 - 1.00 mg/dL 9.26   Sodium 864 - 854 mmol/L 133   Potassium 3.5 - 5.1 mmol/L 4.1   Chloride 98 - 111 mmol/L 96   CO2 22 - 32 mmol/L 21   Calcium 8.9 - 10.3 mg/dL 8.3       Latest Ref Rng & Units 11/27/2024    3:49 AM  CBC  WBC 4.0 - 10.5 K/uL 5.5   Hemoglobin 12.0 - 15.0 g/dL 89.5   Hematocrit 63.9 - 46.0 % 31.7   Platelets 150 - 400 K/uL 61     @IMAGES @  CT Angio Chest Pulmonary Embolism (PE) W or WO Contrast Result Date: 11/26/2024 EXAM: CTA CHEST 11/26/2024 11:37:29 PM TECHNIQUE: CTA of the chest was performed without and with the administration of 75 mL of iohexol  (OMNIPAQUE ) 350 MG/ML injection. Multiplanar reformatted images are provided for review. MIP images are provided for review. Automated exposure control, iterative reconstruction, and/or weight based adjustment of the mA/kV was utilized to reduce the radiation dose to as low as reasonably achievable. COMPARISON: 11/26/2024 CLINICAL HISTORY: Pulmonary embolism (PE) suspected, high prob. FINDINGS: PULMONARY ARTERIES: Pulmonary arteries are adequately opacified for evaluation. No acute pulmonary embolus. Main pulmonary artery is normal in caliber. MEDIASTINUM: The heart and pericardium demonstrate no acute abnormality. There is no acute abnormality of the thoracic aorta. LYMPH NODES:  Bilateral hilar and mediastinal adenopathy again noted, unchanged. LUNGS AND PLEURA: Left upper lobe nodular airspace disease as well as several small pulmonary nodules again noted, unchanged. Bibasilar linear scarring or atelectasis. No evidence of pleural effusion or pneumothorax. UPPER ABDOMEN: Numerous hepatic metastases, better seen on earlier CT. 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 No acute soft tissue abnormality. IMPRESSION: 1. No evidence of pulmonary embolism. 2. Stable left upper lobe nodular airspace disease and several small pulmonary nodules. 3. Stable bilateral hilar and mediastinal adenopathy. 4. 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 5. Numerous hepatic metastases, better seen on earlier CT. Electronically signed by: Franky Crease MD 11/26/2024 11:45 PM EST RP Workstation: [id number]   CT CHEST ABDOMEN PELVIS W CONTRAST Result Date: 11/26/2024 CLINICAL DATA:  Sepsis. Breast cancer with history of liver and spinal metastasis. EXAM: CT CHEST, ABDOMEN, AND PELVIS WITH CONTRAST TECHNIQUE: Multidetector CT imaging of the chest, abdomen and pelvis was performed following the standard protocol during bolus administration of intravenous contrast. RADIATION DOSE REDUCTION: This exam was performed according to the departmental dose-optimization program which includes automated exposure control, adjustment of the mA and/or kV according to patient size and/or use of iterative reconstruction technique. CONTRAST:  OMNIPAQUE  IOHEXOL  300 MG/ML  SOLN COMPARISON:  10/18/2024, 04/30/2008. FINDINGS: CT CHEST FINDINGS Cardiovascular: The heart is normal in size and there is no pericardial effusion. The distal tip of a right chest port terminates at the  cavoatrial junction. The aorta and pulmonary trunk are normal in caliber. Mediastinum/Nodes: Enlarged lymph nodes are present in the mediastinum measuring up to 1.9 cm in the precarinal space. There are prominent lymph nodes  in the hilar regions bilaterally. Lungs/Pleura: Paraseptal and centrilobular emphysematous changes are noted in the lungs. There are scattered pulmonary nodules bilaterally, increased in size and number from the prior exam. For example there is an 8 mm nodule in the posterior segment of the left upper lobe, axial image 37, new from the previous exam. Subpleural consolidation is noted in the anterior aspect of the left upper lobe with communication of soft tissue fullness at the left hilum and, increased from the prior exam. Strandy opacities are noted at the lung bases, possible atelectasis, scarring, or infiltrate. No effusion or pneumothorax is seen. Musculoskeletal: Enlarged lymph nodes are noted in the left chest wall. Surgical clips in a small fluid collection is present in the left axilla measuring 2.6 cm, possible seroma and unchanged from the previous exam. Sclerotic lesions are noted in the thoracic spine, compatible with known metastatic disease. There are mild compression deformities in the superior endplates at T9 and T11, likely present on the prior exam. CT ABDOMEN PELVIS FINDINGS Hepatobiliary: Innumerable hypodense masses are present in the liver, the largest in the left lobe measuring 6.3 cm and increased from the prior exam. No biliary ductal dilatation. The gallbladder is without stones. There suggestion of gallbladder wall thickening, which may be related to local inflammatory changes in the liver. Pancreas: Unremarkable. No pancreatic ductal dilatation or surrounding inflammatory changes. Spleen: Normal in size without focal abnormality. Adrenals/Urinary Tract: The adrenal glands are stable. The kidneys enhance symmetrically. No renal calculus or hydronephrosis. The bladder  is unremarkable. Stomach/Bowel: There is a small hiatal hernia. The stomach is otherwise within normal limits. No bowel obstruction, free air, or pneumatosis is seen. Appendix appears normal. Vascular/Lymphatic: No significant vascular findings are present. Enlarged lymph nodes are seen in the gastrohepatic ligament and porta hepatis. Reproductive: Status post hysterectomy. No adnexal masses. Other: No abdominopelvic ascites. A fat containing periumbilical hernia is present. Musculoskeletal: Degenerative changes are present in the lumbar spine. A sclerotic region is present in the proximal sacrum, possible metastatic disease. No acute fracture is seen. IMPRESSION: 1. Left upper lobe consolidation extending to the left hilum with strandy opacities in the lungs bilaterally, possible infiltrate, scarring, or worsening neoplastic process. 2. Interval increase in number and size of hepatic metastasis and pulmonary nodules. 3. Enlargement of mediastinal, hilar, and upper abdominal lymph adenopathy, likely representing metastatic disease. 4. Osteoblastic metastatic disease. Electronically Signed   By: Leita Birmingham M.D.   On: 11/26/2024 15:44   DG Chest Port 1 View if patient is in a treatment room. Result Date: 11/26/2024 EXAM: 1 VIEW(S) XRAY OF THE CHEST 11/26/2024 01:02:00 PM COMPARISON: None available. CLINICAL HISTORY: Suspected Sepsis FINDINGS: LINES, TUBES AND DEVICES: Right chest Port-A-Cath in place with tip projecting over the right atrium approximately 2 cm distal to the superior cavoatrial junction. LUNGS AND PLEURA: Spontaneous densities overlying the bilateral lower lung zones are likely external to the patient. Band-like opacity at right lung base. No pleural effusion. No pneumothorax. HEART AND MEDIASTINUM: No acute abnormality of the cardiac and mediastinal silhouettes. BONES AND SOFT TISSUES: Left axillary surgical clips noted. No acute osseous abnormality. IMPRESSION: 1. Band-like opacity at the right  lung base, favor subsegmental atelectasis. 2. Right chest Port-A-Cath tip projects in the right atrium, approximately 2 cm distal to the superior cavoatrial junction. Electronically signed by: Morgane Naveau MD 11/26/2024 01:10 PM EST RP Workstation: [id number]   ECHOCARDIOGRAM COMPLETE Result Date: 11/16/2024    ECHOCARDIOGRAM REPORT   Patient Name:   ZULEICA SEITH  Lippard Date of Exam: 11/16/2024 Medical Rec #:  [id number]       Height:       64.0 in Accession #:    [id number]      Weight:       216.5 lb Date of Birth:  1976-06-09      BSA:          2.023 m Patient Age:    48 years        BP:           159/114 mmHg Patient Gender: F               HR:           112 bpm. Exam Location:  [facility] Procedure: 2D Echo, Cardiac Doppler and Color Doppler (Both Spectral and Color            Flow Doppler were utilized during procedure). Indications:     Chemo Z09  History:         Patient has no prior history of Echocardiogram examinations.  Sonographer:     Rosina Dunk Referring Phys:  [id number] CINDY SAUNDERS [facility] Diagnosing Phys: Caron Poser  Sonographer Comments: Image acquisition challenging due to respiratory motion. IMPRESSIONS  1. Very technically difficult study.  2. Left ventricular ejection fraction, by estimation, is 50 to 55%. Left ventricular ejection fraction by 2D MOD biplane is 51.9 %. The left ventricle has low normal function. Left ventricular endocardial border not optimally defined to evaluate regional wall motion. Indeterminate diastolic filling due to E-A fusion.  3. Right ventricular systolic function is normal. The right ventricular size is normal.  4. The mitral valve is normal in structure. No evidence of mitral valve regurgitation. No evidence of mitral stenosis.  5. The aortic valve has an indeterminant number of cusps. Aortic valve regurgitation is not visualized. No aortic stenosis is present. Comparison(s): No prior Echocardiogram. FINDINGS  Left Ventricle: Left ventricular ejection  fraction, by estimation, is 50 to 55%. Left ventricular ejection fraction by 2D MOD biplane is 51.9 %. The left ventricle has low normal function. Left ventricular endocardial border not optimally defined to evaluate regional wall motion. The left ventricular internal cavity size was normal in size. There is no left ventricular hypertrophy. Indeterminate diastolic filling due to E-A fusion. Right Ventricle: The right ventricular size is normal. Right vetricular wall thickness was not well visualized. Right ventricular systolic function is normal. Left Atrium: Left atrial size was not well visualized. Right Atrium: Right atrial size was not well visualized. Pericardium: There is no evidence of pericardial effusion. Presence of epicardial fat layer. Mitral Valve: The mitral valve is normal in structure. No evidence of mitral valve regurgitation. No evidence of mitral valve stenosis. MV peak gradient, 3.4 mmHg. The mean mitral valve gradient is 1.0 mmHg. Tricuspid Valve: The tricuspid valve is not well visualized. Tricuspid valve regurgitation is not demonstrated. No evidence of tricuspid stenosis. Aortic Valve: The aortic valve has an indeterminant number of cusps. Aortic valve regurgitation is not visualized. No aortic stenosis is present. Aortic valve mean gradient measures 1.0 mmHg. Aortic valve peak gradient measures 1.9 mmHg. Aortic valve area, by VTI measures 5.25 cm. Pulmonic Valve: The pulmonic valve was not well visualized. Pulmonic valve regurgitation is trivial. No evidence of pulmonic stenosis. Aorta: The aortic root and ascending aorta are structurally normal, with no evidence of dilitation. Venous: The inferior vena cava was not well visualized. IAS/Shunts: The interatrial septum was not well visualized.  LEFT VENTRICLE  PLAX 2D                        Biplane EF (MOD) LVIDd:         3.60 cm         LV Biplane EF:   Left LVIDs:         2.50 cm                          ventricular LV PW:         1.00 cm                           ejection LV IVS:        0.90 cm                          fraction by LVOT diam:     2.20 cm                          2D MOD LV SV:         38                               biplane is LV SV Index:   19                               51.9 %. LVOT Area:     3.80 cm                                Diastology                                LV e' medial:    10.10 cm/s LV Volumes (MOD)               LV E/e' medial:  4.2 LV vol d, MOD    44.9 ml       LV e' lateral:   7.62 cm/s A2C:                           LV E/e' lateral: 5.6 LV vol d, MOD    41.1 ml A4C: LV vol s, MOD    25.7 ml A2C: LV vol s, MOD    17.9 ml A4C: LV SV MOD A2C:   19.2 ml LV SV MOD A4C:   41.1 ml LV SV MOD BP:    24.2 ml RIGHT VENTRICLE RV Basal diam:  3.30 cm     PULMONARY VEINS RV Mid diam:    2.10 cm     A Reversal Duration: 102.00 msec RV S prime:     11.70 cm/s  A Reversal Velocity: 56.60 cm/s TAPSE (M-mode): 1.6 cm      Diastolic Velocity:  47.80 cm/s                             S/D Velocity:        1.50  Systolic Velocity:   73.90 cm/s LEFT ATRIUM             Index       RIGHT ATRIUM           Index LA diam:        2.60 cm 1.28 cm/m  RA Area:     11.60 cm LA Vol (A2C):   17.5 ml 8.65 ml/m  RA Volume:   26.80 ml  13.24 ml/m LA Vol (A4C):   15.8 ml 7.81 ml/m LA Biplane Vol: 18.5 ml 9.14 ml/m  AORTIC VALVE                    PULMONIC VALVE AV Area (Vmax):    4.04 cm     PV Vmax:          0.76 m/s AV Area (Vmean):   3.75 cm     PV Vmean:         51.300 cm/s AV Area (VTI):     5.25 cm     PV VTI:           0.114 m AV Vmax:           69.00 cm/s   PV Peak grad:     2.3 mmHg AV Vmean:          45.700 cm/s  PV Mean grad:     1.0 mmHg AV VTI:            0.072 m      PR End Diast Vel: 6.25 msec AV Peak Grad:      1.9 mmHg     RVOT Peak grad:   2 mmHg AV Mean Grad:      1.0 mmHg LVOT Vmax:         73.40 cm/s LVOT Vmean:        45.100 cm/s LVOT VTI:          0.099 m LVOT/AV VTI ratio: 1.38  AORTA Ao Root  diam: 3.00 cm Ao Asc diam:  3.20 cm MITRAL VALVE MV Area (PHT): 6.80 cm    SHUNTS MV Area VTI:   2.77 cm    Systemic VTI:  0.10 m MV Peak grad:  3.4 mmHg    Systemic Diam: 2.20 cm MV Mean grad:  1.0 mmHg    Pulmonic VTI:  0.075 m MV Vmax:       0.92 m/s MV Vmean:      50.0 cm/s MV Decel Time: 112 msec MV E velocity: 42.70 cm/s MV A velocity: 91.70 cm/s MV E/A ratio:  0.47 Tefl Teacher signed by Caron Poser Signature Date/Time: 11/16/2024/3:20:31 PM    Final     Assessment and plan- Patient is a 49 y.o. female with history of metastatic triple negative breast cancer s/p 1 cycle of Doxil  chemotherapy admitted for worsening shortness of breath and abdominal pain  Suspect Her ongoing symptoms are secondary to disease progression since there is no overt etiology for her abnormal LFTs apparent on CT.  There is a possible pneumonia noted on CT for which she is getting treated with ceftriaxone  and vancomycin .  Patient just started Doxil  chemotherapy on 11/22/2025.  Is therefore too soon to tell if she is responding to it or not.  Regardless patient has had progression on multiple prior lines of chemotherapy and therefore her overall prognosis is poor.  Continue supportive care at this time and Dr. Rennie will have further goals of care conversations with  her and we will have palliative care on board as well.  Microcytic anemia and thrombocytopenia likely secondary to underlying malignancy and chemotherapy.  I will add ferritin and iron studies B12 and folate to morning labs tomorrow   Thank you for this kind referral and the opportunity to participate in the care of this  Patient   Visit Diagnosis 1. Pneumonia of left upper lobe due to infectious organism   2. Malignant neoplasm metastatic to breast [facility])     Dr. Annah Skene, MD, MPH [facility] [phone] 11/27/2024                   [1] No Known Allergies [2]  Current  Facility-Administered Medications:    acetaminophen  (TYLENOL ) tablet 650 mg, 650 mg, Oral, Q6H PRN **OR** acetaminophen  (TYLENOL ) suppository 650 mg, 650 mg, Rectal, Q6H PRN, Fernand Prost, MD   azithromycin  (ZITHROMAX ) 500 mg in sodium chloride  0.9 % 250 mL IVPB, 500 mg, Intravenous, Q24H, Fernand Prost, MD, Last Rate: 250 mL/hr at 11/27/24 0041, 500 mg at 11/27/24 0041   cefTRIAXone  (ROCEPHIN ) 2 g in sodium chloride  0.9 % 100 mL IVPB, 2 g, Intravenous, Q24H, Fernand Prost, MD, Last Rate: 200 mL/hr at 11/27/24 0048, 2 g at 11/27/24 0048   Chlorhexidine  Gluconate Cloth 2 % PADS 6 each, 6 each, Topical, Daily, Fernand Prost, MD   heparin  injection 5,000 Units, 5,000 Units, Subcutaneous, Q8H, Fernand Prost, MD, 5,000 Units at 11/27/24 0041   HYDROmorphone  (DILAUDID ) injection 0.5 mg, 0.5 mg, Intravenous, Q3H PRN, Fernand Prost, MD, 0.5 mg at 11/27/24 0040   HYDROmorphone  (DILAUDID ) tablet 2 mg, 2 mg, Oral, Q4H PRN, Fernand Prost, MD, 2 mg at 11/26/24 1943   ipratropium-albuterol  (DUONEB) 0.5-2.5 (3) MG/3ML nebulizer solution 3 mL, 3 mL, Nebulization, Q6H PRN, Fernand Prost, MD   lactated ringers  infusion, , Intravenous, Continuous, Jossie, Evan K, MD, Stopped at 11/26/24 1849   lactated ringers  infusion, , Intravenous, Continuous, Fernand Prost, MD, Last Rate: 100 mL/hr at 11/27/24 0032, New Bag at 11/27/24 0032   metoprolol  tartrate (LOPRESSOR ) injection 5 mg, 5 mg, Intravenous, Once PRN, Cleatus Delayne GAILS, MD   ondansetron  (ZOFRAN ) tablet 4 mg, 4 mg, Oral, Q6H PRN **OR** ondansetron  (ZOFRAN ) injection 4 mg, 4 mg, Intravenous, Q6H PRN, Fernand Prost, MD   senna-docusate (Senokot-S) tablet 1 tablet, 1 tablet, Oral, QHS PRN, Fernand Prost, MD   sodium chloride  flush (NS) 0.9 % injection 10-40 mL, 10-40 mL, Intracatheter, Q12H, Fernand Prost, MD, 10 mL at 11/27/24 0041   sodium chloride  flush (NS) 0.9 % injection 10-40 mL, 10-40 mL, Intracatheter, PRN, Fernand Prost, MD  "

## 2024-11-27 NOTE — Progress Notes (Signed)
 Patient alert and oriented.  Has been a YELLOW MEWS throughout shift d/t tachycardia and tachypnea.  Patient has expressed feelings of sadness of her diagnosis and was told about Chaplain services but she declined them.  Also offered anxiety medication but that was also declined.

## 2024-11-28 ENCOUNTER — Inpatient Hospital Stay

## 2024-11-28 DIAGNOSIS — A419 Sepsis, unspecified organism: Secondary | ICD-10-CM | POA: Diagnosis not present

## 2024-11-28 DIAGNOSIS — J189 Pneumonia, unspecified organism: Secondary | ICD-10-CM | POA: Diagnosis not present

## 2024-11-28 DIAGNOSIS — Z789 Other specified health status: Secondary | ICD-10-CM | POA: Diagnosis not present

## 2024-11-28 DIAGNOSIS — Z7189 Other specified counseling: Secondary | ICD-10-CM | POA: Diagnosis not present

## 2024-11-28 DIAGNOSIS — Z515 Encounter for palliative care: Secondary | ICD-10-CM

## 2024-11-28 LAB — CBC
HCT: 27.8 % — ABNORMAL LOW (ref 36.0–46.0)
Hemoglobin: 9.3 g/dL — ABNORMAL LOW (ref 12.0–15.0)
MCH: 24.6 pg — ABNORMAL LOW (ref 26.0–34.0)
MCHC: 33.5 g/dL (ref 30.0–36.0)
MCV: 73.5 fL — ABNORMAL LOW (ref 80.0–100.0)
Platelets: 43 K/uL — ABNORMAL LOW (ref 150–400)
RBC: 3.78 MIL/uL — ABNORMAL LOW (ref 3.87–5.11)
RDW: 21.3 % — ABNORMAL HIGH (ref 11.5–15.5)
WBC: 3.5 K/uL — ABNORMAL LOW (ref 4.0–10.5)
nRBC: 2.3 % — ABNORMAL HIGH (ref 0.0–0.2)

## 2024-11-28 LAB — IRON AND TIBC
Iron: 107 ug/dL (ref 28–170)
Saturation Ratios: 49 % — ABNORMAL HIGH (ref 10.4–31.8)
TIBC: 217 ug/dL — ABNORMAL LOW (ref 250–450)
UIBC: 110 ug/dL

## 2024-11-28 LAB — COMPREHENSIVE METABOLIC PANEL WITH GFR
ALT: 452 U/L — ABNORMAL HIGH (ref 0–44)
AST: 558 U/L — ABNORMAL HIGH (ref 15–41)
Albumin: 2.7 g/dL — ABNORMAL LOW (ref 3.5–5.0)
Alkaline Phosphatase: 531 U/L — ABNORMAL HIGH (ref 38–126)
Anion gap: 14 (ref 5–15)
BUN: 9 mg/dL (ref 6–20)
CO2: 23 mmol/L (ref 22–32)
Calcium: 8.6 mg/dL — ABNORMAL LOW (ref 8.9–10.3)
Chloride: 100 mmol/L (ref 98–111)
Creatinine, Ser: 0.62 mg/dL (ref 0.44–1.00)
GFR, Estimated: >60 mL/min
Glucose, Bld: 152 mg/dL — ABNORMAL HIGH (ref 70–99)
Potassium: 4 mmol/L (ref 3.5–5.1)
Sodium: 137 mmol/L (ref 135–145)
Total Bilirubin: 1 mg/dL (ref 0.0–1.2)
Total Protein: 5.9 g/dL — ABNORMAL LOW (ref 6.5–8.1)

## 2024-11-28 LAB — GLUCOSE, CAPILLARY
Glucose-Capillary: 169 mg/dL — ABNORMAL HIGH (ref 70–99)
Glucose-Capillary: 110 mg/dL — ABNORMAL HIGH (ref 70–99)
Glucose-Capillary: 145 mg/dL — ABNORMAL HIGH (ref 70–99)
Glucose-Capillary: 117 mg/dL — ABNORMAL HIGH (ref 70–99)

## 2024-11-28 LAB — FERRITIN: Ferritin: >7500 ng/mL — ABNORMAL HIGH (ref 11–307)

## 2024-11-28 LAB — FOLATE: Folate: 9.3 ng/mL

## 2024-11-28 LAB — LACTIC ACID, PLASMA: Lactic Acid, Venous: 3.4 mmol/L (ref 0.5–1.9)

## 2024-11-28 MED ORDER — GADOBUTROL 1 MMOL/ML IV SOLN
10.0000 mL | Freq: Once | INTRAVENOUS | Status: AC | PRN
  Administered 2024-11-28: 10 mL via INTRAVENOUS

## 2024-11-28 MED ORDER — SODIUM CHLORIDE 0.9 % IV BOLUS
500.0000 mL | Freq: Once | INTRAVENOUS | Status: AC
  Administered 2024-11-28: 500 mL via INTRAVENOUS

## 2024-11-28 MED ORDER — ENSURE PLUS HIGH PROTEIN PO LIQD
237.0000 mL | Freq: Two times a day (BID) | ORAL | Status: DC
  Administered 2024-11-28 – 2024-12-04 (×11): 237 mL via ORAL

## 2024-11-28 MED ORDER — METOPROLOL TARTRATE 5 MG/5ML IV SOLN
5.0000 mg | Freq: Once | INTRAVENOUS | Status: AC | PRN
  Administered 2024-11-28: 5 mg via INTRAVENOUS
  Filled 2024-11-28: qty 5

## 2024-11-28 MED ORDER — ORAL CARE MOUTH RINSE: 15.0000 mL | OROMUCOSAL | Status: DC | PRN

## 2024-11-28 NOTE — Progress Notes (Signed)
 Patients heart rate sustaining in 130's. Dr. Jens notified. Will continue to monitor.

## 2024-11-28 NOTE — Consult Note (Signed)
 "    Palliative Medicine [facility] at [facility] [phone] [phone]   Name: Tara Hanna Date: 11/28/2024 MRN: [id number]  DOB: 1976-06-15  Patient Care Team: System, Provider Not In as PCP - General Tara Shasta POUR, RN as Oncology Nurse Navigator Tara Cindy SAUNDERS, MD as Consulting Physician (Oncology)    REASON FOR CONSULTATION: Tara Hanna is a 49 y.o. 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  Patient most recently treated with cycle 1 Doxil  on 11/22/2024.  She was subsequently hospitalized with abnormal LFTs and sepsis from pneumonia.  Palliative care was consulted to address goals.  SOCIAL HISTORY:     reports that she has quit smoking. Her smoking use included cigarettes. She has a 22 pack-year smoking history. She has never used smokeless tobacco. She reports that she does not currently use alcohol after a past usage of about 2.0 standard drinks of alcohol per week. She reports current drug use. Frequency: 2.00 times per week. Drug: Marijuana.  Patient is married and lives at home with her husband, 70 year old daughter, and several grandchildren.  Patient was married in 2025.  She has 2 sons and 2 daughters.  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    ADVANCE DIRECTIVES:  Does not have  CODE STATUS: Full code  PAST MEDICAL HISTORY: Past Medical History:  Diagnosis Date   Breast cancer (HCC)    Fibroids    Hypertension     PAST SURGICAL HISTORY:  Past Surgical History:  Procedure Laterality Date   ABDOMINAL HYSTERECTOMY     BREAST LUMPECTOMY Left 2024    HEMATOLOGY/ONCOLOGY HISTORY:  Oncology History Overview Note  Stage IIIB Left breast IDC (cT2, N1, Mx, G3, ER 2%, PR- , HER2-); functionally TNBC -S/p PRAD study, involving a single dose of pembrolizumab  with radiation prior to  starting standard chemo plus immunotherapy. She was randomized to no upfront RT boost. She has had multiple delays in her chemotherapy treatment for various reasons. Including infection and cytopenias. She completed last dose on 10/27/2022.   She is s/p left breast lumpectomy and axillary LN dissection of axillary staging on 11/27/2022 with 9 of 28 lymph nodes positive for macro metastatic carcinoma with the size of the largest tumor deposit 33 mm, treatment effect identified in multiple lymph nodes, extensive lymphovascular space invasion, and extranodal extension present less than 2 mm. Residual left breast invasive ductal carcinoma with tumor size 64 mm in greatest dimension ER+(2%), PR -, HER2- (IHC 1+). She has Residual Cancer Burden Class: RCB-III   Adjuvant systemic therapy: -Previously discussed option of enrolling in the ASCENT-05/OptimICE-RD (AFT-65): Phase 3, randomized, open-label study of adjuvant sacituzumab govitecan + pembrolizumab  (pembro) vs pembro  capecitabine in patients with triple-negative breast cancer and residual disease. After meeting with study coordinator she declined.  -Proceeded with SOC using Capecitabine while continuing Pembrolizumab  every 6 weeks (last cycle on 11/26/2022)  # Recurrent TNBC Metastatic breast cancer  1st line-BRE18-360 Phase I/II Study included Olapraib and Durvalumab 2nd line-Sacituzumab  3rd line- Enhertu  Clinical trials: [id number]. TRADE-DXD not eligible since previous Saci. She is not eligibile for UARMR941 given brain mets.  She was completing her last cycle of capecitabine when she unfortunately presented with seizure like symptoms (Facial twitching-10/2023). Brain MRI showed 2 enhancing intracranial masses measuring up to 1.8cm in the right frontal lobe and 1.2cm in the posterior right  parietal lobe with surrounding vasogenic edema. We obtained restaging at the time of discovering CNS progression; with no radiographic evidence of systemic disease.  She enrolled in the BRE18-360 Phase I/II Study of Stereotactic Radiosurgery with Concurrent Administration of DNA Damage Response (DDR) Inhibitor (Olaparib) Followed by Adjuvant Combination of Durvalumab ([id number]) and Physician's Choice Systemic Therapy in Subjects with Breast Cancer Brain Metastases. Patient consented and started treatment 11/20/23. Olaparib chosen for systemic therapy given absence of systemic disease at the time.  She completed Cyber knife to the brain on 12/03/23. She remained on study for approximately 6 months before noted progression.  -May 2025 POD new pulmonary nodules with mediastinal and bilateral hilar lymphadenopathy.There was also indeterminate foci along the hepatic dome. We discussed obtaining biopsy of distant site of disease since she initially presented with no extracranial disease. EBUS and mediastinal LN FNA was completed on 05/04/2024 confirming metastatic TNBC. Sample sent for tempus testing.We proceeded with SOC sacituzumab govitecan at this time  -August 2025. She is s/p C4 she is tolerating okay; although she reports significant fatigue a few days following treatment each week. She is concern about the dosing and inquires if a dose reduction is possible. She describes grade 2/3 fatigue interfering with daily activities. We discussed DR in effort to improve tolerance. Of note She did have tx hold last cycle due to neutropenia, we have add GSCF support Day 10 patient self injects.   S/p Sacituzumab 7.5 mg/kg Days 1,8 every 21 days ( DR starting C4) and Pegfilgrastim  6 mg on day 10  -September 2025. She had progressive disease with new bone lesions on 08/02/24 and a pathological fracture.Reports weight gain and new lower extremity swelling while on Sacituzumab. Voiced that she would like to proceed with standard of care treatment as opposed to clinical trials. We discussed starting Enhertu and after discussing general side effects, she agreed. We weill have our pharmacist  reach out for formal chemo education. Her father had just passed away this weekend and is arranging the funeral; she would like to resume treatment in 2 weeks once she returns. - 08/19/2024 C1D1 Enhertu with Zometa. Here to begin treatment. Labs are adequate for therapy. Will proceed with first cycle chemotherapy with current dose and schedule as planned. Reviewed antiemetic plan. -09/30/2024 C3D1 Enhertu. Overall tolerated previous cycle of Enhertu. Labs appropriate for treatment today. We discussed completing upcoming CT CAP + bone scan to assess response prior to C4.  -10/11/24 MRI brain: New enhancing lesions within the right cerebellar hemisphere and left occipital lobe. Right frontal subcortical lesion measuring 1.3 x 0.7 cm with minimal surrounding edema, previously 1.4 x 0.9 cm. Interval size increase of a right parieto-occipital enhancing lesion (POD vs post-radiation changes)  -10/18/24 CT A/P: Numerous new ill-defined hypoattenuating foci are seen scattered throughout the hepatic parenchyma, which are worrisome for potential new sites of metastasis. New soft tissue nodule within the subcutaneous tissues along the left lateral chest wall, which measures up to 1.3 cm in greatest axial dimension, also concerning for new metastases. -10/25/24: C4 Enhertu cancelled given POD on scans. Discussed 4th line liposomal doxorubicin .   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 -10/11/24 MRI brain showed new enhancing lesions within the right cerebellar hemisphere and left occipital lobe and Interval size increase of a right parieto-occipital enhancing lesion (unclear if POD vs post-radiation changes)  -started dexamethasone  4 mg daily given vasogenic edema seen on  09/2024 brain MRI. -Continue to follow Neurology. She was switched back to Keppra  per Neuro.     Cancer of right breast metastatic to brain [facility])  11/08/2024  Initial Diagnosis   Cancer of right breast metastatic to brain [facility])   11/22/2024 -  Chemotherapy   Patient is on Treatment Plan : BREAST Liposomal Doxorubicin  (50) q28d       ALLERGIES:  has no known allergies.  MEDICATIONS:  Current Facility-Administered Medications  Medication Dose Route Frequency Provider Last Rate Last Admin   acetaminophen  (TYLENOL ) tablet 650 mg  650 mg Oral Q6H PRN Fernand Prost, MD       Or   acetaminophen  (TYLENOL ) suppository 650 mg  650 mg Rectal Q6H PRN Khan, Ghalib, MD       ALPRAZolam  (XANAX ) tablet 0.25 mg  0.25 mg Oral Daily PRN Jens Durand, MD   0.25 mg at 11/27/24 2126   azithromycin  (ZITHROMAX ) 500 mg in sodium chloride  0.9 % 250 mL IVPB  500 mg Intravenous Q24H Fernand Prost, MD   Stopped at 11/28/24 0121   cefTRIAXone  (ROCEPHIN ) 2 g in sodium chloride  0.9 % 100 mL IVPB  2 g Intravenous Q24H Khan, Ghalib, MD   Stopped at 11/28/24 0051   Chlorhexidine  Gluconate Cloth 2 % PADS 6 each  6 each Topical Daily Fernand Prost, MD   6 each at 11/27/24 1557   feeding supplement (ENSURE PLUS HIGH PROTEIN) liquid 237 mL  237 mL Oral BID BM Jens Durand, MD   237 mL at 11/28/24 0800   heparin  injection 5,000 Units  5,000 Units Subcutaneous Q8H Khan, Ghalib, MD   5,000 Units at 11/28/24 0540   HYDROmorphone  (DILAUDID ) injection 0.5 mg  0.5 mg Intravenous Q3H PRN Khan, Ghalib, MD   0.5 mg at 11/27/24 0040   HYDROmorphone  (DILAUDID ) tablet 2 mg  2 mg Oral Q4H PRN Khan, Ghalib, MD   2 mg at 11/27/24 2126   ipratropium-albuterol  (DUONEB) 0.5-2.5 (3) MG/3ML nebulizer solution 3 mL  3 mL Nebulization Q6H PRN Fernand Prost, MD       levETIRAcetam  (KEPPRA ) tablet 500 mg  500 mg Oral BID Jens Durand, MD   500 mg at 11/28/24 0801   ondansetron  (ZOFRAN ) tablet 4 mg  4 mg Oral Q6H PRN Khan, Ghalib, MD       Or   ondansetron  (ZOFRAN ) injection 4 mg  4 mg Intravenous Q6H PRN Khan, Ghalib, MD       Oral care mouth rinse  15 mL Mouth Rinse PRN Jens Durand, MD        senna-docusate (Senokot-S) tablet 1 tablet  1 tablet Oral QHS PRN Fernand Prost, MD       sodium chloride  flush (NS) 0.9 % injection 10-40 mL  10-40 mL Intracatheter Q12H Fernand Prost, MD   10 mL at 11/28/24 0801   sodium chloride  flush (NS) 0.9 % injection 10-40 mL  10-40 mL Intracatheter PRN Fernand Prost, MD        VITAL SIGNS: BP 93/72 (BP Location: Right Arm)   Pulse (!) 137   Temp 98.6 F (37 C)   Resp 20   Ht 5' 4 (1.626 m)   Wt 222 lb 14.2 oz (101.1 kg)   SpO2 96%   BMI 38.26 kg/m  Filed Weights   11/26/24 1242 11/28/24 0500  Weight: 218 lb 4.1 oz (99 kg) 222 lb 14.2 oz (101.1 kg)    Estimated body mass index is 38.26 kg/m as calculated from the following:  Height as of this encounter: 5' 4 (1.626 m).   Weight as of this encounter: 222 lb 14.2 oz (101.1 kg).  LABS: CBC:    Component Value Date/Time   WBC 3.5 (L) 11/28/2024 0555   HGB 9.3 (L) 11/28/2024 0555   HGB 11.1 (L) 11/22/2024 0815   HCT 27.8 (L) 11/28/2024 0555   PLT 43 (L) 11/28/2024 0555   PLT 133 (L) 11/22/2024 0815   MCV 73.5 (L) 11/28/2024 0555   NEUTROABS 5.0 11/26/2024 1302   LYMPHSABS 0.7 11/26/2024 1302   MONOABS 0.2 11/26/2024 1302   EOSABS 0.0 11/26/2024 1302   BASOSABS 0.0 11/26/2024 1302   Comprehensive Metabolic Panel:    Component Value Date/Time   NA 137 11/28/2024 0555   K 4.0 11/28/2024 0555   CL 100 11/28/2024 0555   CO2 23 11/28/2024 0555   BUN 9 11/28/2024 0555   CREATININE 0.62 11/28/2024 0555   CREATININE 0.70 11/22/2024 0815   GLUCOSE 152 (H) 11/28/2024 0555   CALCIUM 8.6 (L) 11/28/2024 0555   AST 558 (H) 11/28/2024 0555   AST 180 (HH) 11/22/2024 0815   ALT 452 (H) 11/28/2024 0555   ALT 302 (HH) 11/22/2024 0815   ALKPHOS 531 (H) 11/28/2024 0555   BILITOT 1.0 11/28/2024 0555   BILITOT 0.7 11/22/2024 0815   PROT 5.9 (L) 11/28/2024 0555   ALBUMIN 2.7 (L) 11/28/2024 0555    RADIOGRAPHIC STUDIES: CT Angio Chest Pulmonary Embolism (PE) W or WO Contrast Result Date:  11/26/2024 EXAM: CTA CHEST 11/26/2024 11:37:29 PM TECHNIQUE: CTA of the chest was performed without and with the administration of 75 mL of iohexol  (OMNIPAQUE ) 350 MG/ML injection. Multiplanar reformatted images are provided for review. MIP images are provided for review. Automated exposure control, iterative reconstruction, and/or weight based adjustment of the mA/kV was utilized to reduce the radiation dose to as low as reasonably achievable. COMPARISON: 11/26/2024 CLINICAL HISTORY: Pulmonary embolism (PE) suspected, high prob. FINDINGS: PULMONARY ARTERIES: Pulmonary arteries are adequately opacified for evaluation. No acute pulmonary embolus. Main pulmonary artery is normal in caliber. MEDIASTINUM: The heart and pericardium demonstrate no acute abnormality. There is no acute abnormality of the thoracic aorta. LYMPH NODES: Bilateral hilar and mediastinal adenopathy again noted, unchanged. LUNGS AND PLEURA: Left upper lobe nodular airspace disease as well as several small pulmonary nodules again noted, unchanged. Bibasilar linear scarring or atelectasis. No evidence of pleural effusion or pneumothorax. UPPER ABDOMEN: Numerous hepatic metastases, better seen on earlier CT. 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 No acute soft tissue abnormality. IMPRESSION: 1. No evidence of pulmonary embolism. 2. Stable left upper lobe nodular airspace disease and several small pulmonary nodules. 3. Stable bilateral hilar and mediastinal adenopathy. 4. 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 5. Numerous hepatic metastases, better seen on earlier CT. Electronically signed by: Franky Crease MD 11/26/2024 11:45 PM EST RP Workstation: [id number]   CT CHEST ABDOMEN PELVIS W CONTRAST Result  Date: 11/26/2024 CLINICAL DATA:  Sepsis. Breast cancer with history of liver and spinal metastasis. EXAM: CT CHEST, ABDOMEN, AND PELVIS WITH CONTRAST TECHNIQUE: Multidetector CT imaging of the chest, abdomen and pelvis was performed following the standard protocol during bolus administration of intravenous contrast. RADIATION DOSE REDUCTION: This exam was performed according to the departmental dose-optimization program which includes automated exposure  control, adjustment of the mA and/or kV according to patient size and/or use of iterative reconstruction technique. CONTRAST:  OMNIPAQUE  IOHEXOL  300 MG/ML  SOLN COMPARISON:  10/18/2024, 04/30/2008. FINDINGS: CT CHEST FINDINGS Cardiovascular: The heart is normal in size and there is no pericardial effusion. The distal tip of a right chest port terminates at the cavoatrial junction. The aorta and pulmonary trunk are normal in caliber. Mediastinum/Nodes: Enlarged lymph nodes are present in the mediastinum measuring up to 1.9 cm in the precarinal space. There are prominent lymph nodes in the hilar regions bilaterally. Lungs/Pleura: Paraseptal and centrilobular emphysematous changes are noted in the lungs. There are scattered pulmonary nodules bilaterally, increased in size and number from the prior exam. For example there is an 8 mm nodule in the posterior segment of the left upper lobe, axial image 37, new from the previous exam. Subpleural consolidation is noted in the anterior aspect of the left upper lobe with communication of soft tissue fullness at the left hilum and, increased from the prior exam. Strandy opacities are noted at the lung bases, possible atelectasis, scarring, or infiltrate. No effusion or pneumothorax is seen. Musculoskeletal: Enlarged lymph nodes are noted in the left chest wall. Surgical clips in a small fluid collection is present in the left axilla measuring 2.6 cm, possible seroma and unchanged from the previous exam. Sclerotic lesions  are noted in the thoracic spine, compatible with known metastatic disease. There are mild compression deformities in the superior endplates at T9 and T11, likely present on the prior exam. CT ABDOMEN PELVIS FINDINGS Hepatobiliary: Innumerable hypodense masses are present in the liver, the largest in the left lobe measuring 6.3 cm and increased from the prior exam. No biliary ductal dilatation. The gallbladder is without stones. There suggestion of gallbladder wall thickening, which may be related to local inflammatory changes in the liver. Pancreas: Unremarkable. No pancreatic ductal dilatation or surrounding inflammatory changes. Spleen: Normal in size without focal abnormality. Adrenals/Urinary Tract: The adrenal glands are stable. The kidneys enhance symmetrically. No renal calculus or hydronephrosis. The bladder is unremarkable. Stomach/Bowel: There is a small hiatal hernia. The stomach is otherwise within normal limits. No bowel obstruction, free air, or pneumatosis is seen. Appendix appears normal. Vascular/Lymphatic: No significant vascular findings are present. Enlarged lymph nodes are seen in the gastrohepatic ligament and porta hepatis. Reproductive: Status post hysterectomy. No adnexal masses. Other: No abdominopelvic ascites. A fat containing periumbilical hernia is present. Musculoskeletal: Degenerative changes are present in the lumbar spine. A sclerotic region is present in the proximal sacrum, possible metastatic disease. No acute fracture is seen. IMPRESSION: 1. Left upper lobe consolidation extending to the left hilum with strandy opacities in the lungs bilaterally, possible infiltrate, scarring, or worsening neoplastic process. 2. Interval increase in number and size of hepatic metastasis and pulmonary nodules. 3. Enlargement of mediastinal, hilar, and upper abdominal lymph adenopathy, likely representing metastatic disease. 4. Osteoblastic metastatic disease. Electronically Signed   By: Leita Birmingham M.D.   On: 11/26/2024 15:44   DG Chest Port 1 View if patient is in a treatment room. Result Date: 11/26/2024 EXAM: 1 VIEW(S) XRAY OF THE CHEST 11/26/2024 01:02:00 PM COMPARISON: None available. CLINICAL HISTORY: Suspected Sepsis FINDINGS: LINES, TUBES AND DEVICES: Right chest Port-A-Cath in place with tip projecting over the right atrium approximately 2 cm distal to the superior cavoatrial junction. LUNGS AND PLEURA: Spontaneous densities overlying the bilateral lower lung zones are likely external to the patient. Band-like opacity at right lung base. No pleural  effusion. No pneumothorax. HEART AND MEDIASTINUM: No acute abnormality of the cardiac and mediastinal silhouettes. BONES AND SOFT TISSUES: Left axillary surgical clips noted. No acute osseous abnormality. IMPRESSION: 1. Band-like opacity at the right lung base, favor subsegmental atelectasis. 2. Right chest Port-A-Cath tip projects in the right atrium, approximately 2 cm distal to the superior cavoatrial junction. Electronically signed by: Morgane Naveau MD 11/26/2024 01:10 PM EST RP Workstation: [id number]   ECHOCARDIOGRAM COMPLETE Result Date: 11/16/2024    ECHOCARDIOGRAM REPORT   Patient Name:   Tara Hanna Date of Exam: 11/16/2024 Medical Rec #:  [id number]       Height:       64.0 in Accession #:    [id number]      Weight:       216.5 lb Date of Birth:  1976/11/24      BSA:          2.023 m Patient Age:    48 years        BP:           159/114 mmHg Patient Gender: F               HR:           112 bpm. Exam Location:  [facility] Procedure: 2D Echo, Cardiac Doppler and Color Doppler (Both Spectral and Color            Flow Doppler were utilized during procedure). Indications:     Chemo Z09  History:         Patient has no prior history of Echocardiogram examinations.  Sonographer:     Rosina Dunk Referring Phys:  [id number] CINDY Hanna [facility] Diagnosing Phys: Caron Poser  Sonographer Comments: Image acquisition challenging due to  respiratory motion. IMPRESSIONS  1. Very technically difficult study.  2. Left ventricular ejection fraction, by estimation, is 50 to 55%. Left ventricular ejection fraction by 2D MOD biplane is 51.9 %. The left ventricle has low normal function. Left ventricular endocardial border not optimally defined to evaluate regional wall motion. Indeterminate diastolic filling due to E-A fusion.  3. Right ventricular systolic function is normal. The right ventricular size is normal.  4. The mitral valve is normal in structure. No evidence of mitral valve regurgitation. No evidence of mitral stenosis.  5. The aortic valve has an indeterminant number of cusps. Aortic valve regurgitation is not visualized. No aortic stenosis is present. Comparison(s): No prior Echocardiogram. FINDINGS  Left Ventricle: Left ventricular ejection fraction, by estimation, is 50 to 55%. Left ventricular ejection fraction by 2D MOD biplane is 51.9 %. The left ventricle has low normal function. Left ventricular endocardial border not optimally defined to evaluate regional wall motion. The left ventricular internal cavity size was normal in size. There is no left ventricular hypertrophy. Indeterminate diastolic filling due to E-A fusion. Right Ventricle: The right ventricular size is normal. Right vetricular wall thickness was not well visualized. Right ventricular systolic function is normal. Left Atrium: Left atrial size was not well visualized. Right Atrium: Right atrial size was not well visualized. Pericardium: There is no evidence of pericardial effusion. Presence of epicardial fat layer. Mitral Valve: The mitral valve is normal in structure. No evidence of mitral valve regurgitation. No evidence of mitral valve stenosis. MV peak gradient, 3.4 mmHg. The mean mitral valve gradient is 1.0 mmHg. Tricuspid Valve: The tricuspid valve is not well visualized. Tricuspid valve regurgitation is not demonstrated. No evidence of tricuspid stenosis. Aortic  Valve: The aortic valve  has an indeterminant number of cusps. Aortic valve regurgitation is not visualized. No aortic stenosis is present. Aortic valve mean gradient measures 1.0 mmHg. Aortic valve peak gradient measures 1.9 mmHg. Aortic valve area, by VTI measures 5.25 cm. Pulmonic Valve: The pulmonic valve was not well visualized. Pulmonic valve regurgitation is trivial. No evidence of pulmonic stenosis. Aorta: The aortic root and ascending aorta are structurally normal, with no evidence of dilitation. Venous: The inferior vena cava was not well visualized. IAS/Shunts: The interatrial septum was not well visualized.  LEFT VENTRICLE PLAX 2D                        Biplane EF (MOD) LVIDd:         3.60 cm         LV Biplane EF:   Left LVIDs:         2.50 cm                          ventricular LV PW:         1.00 cm                          ejection LV IVS:        0.90 cm                          fraction by LVOT diam:     2.20 cm                          2D MOD LV SV:         38                               biplane is LV SV Index:   19                               51.9 %. LVOT Area:     3.80 cm                                Diastology                                LV e' medial:    10.10 cm/s LV Volumes (MOD)               LV E/e' medial:  4.2 LV vol d, MOD    44.9 ml       LV e' lateral:   7.62 cm/s A2C:                           LV E/e' lateral: 5.6 LV vol d, MOD    41.1 ml A4C: LV vol s, MOD    25.7 ml A2C: LV vol s, MOD    17.9 ml A4C: LV SV MOD A2C:   19.2 ml LV SV MOD A4C:   41.1 ml LV SV MOD BP:    24.2 ml RIGHT VENTRICLE RV Basal diam:  3.30 cm     PULMONARY VEINS RV Mid  diam:    2.10 cm     A Reversal Duration: 102.00 msec RV S prime:     11.70 cm/s  A Reversal Velocity: 56.60 cm/s TAPSE (M-mode): 1.6 cm      Diastolic Velocity:  47.80 cm/s                             S/D Velocity:        1.50                             Systolic Velocity:   73.90 cm/s LEFT ATRIUM             Index       RIGHT ATRIUM            Index LA diam:        2.60 cm 1.28 cm/m  RA Area:     11.60 cm LA Vol (A2C):   17.5 ml 8.65 ml/m  RA Volume:   26.80 ml  13.24 ml/m LA Vol (A4C):   15.8 ml 7.81 ml/m LA Biplane Vol: 18.5 ml 9.14 ml/m  AORTIC VALVE                    PULMONIC VALVE AV Area (Vmax):    4.04 cm     PV Vmax:          0.76 m/s AV Area (Vmean):   3.75 cm     PV Vmean:         51.300 cm/s AV Area (VTI):     5.25 cm     PV VTI:           0.114 m AV Vmax:           69.00 cm/s   PV Peak grad:     2.3 mmHg AV Vmean:          45.700 cm/s  PV Mean grad:     1.0 mmHg AV VTI:            0.072 m      PR End Diast Vel: 6.25 msec AV Peak Grad:      1.9 mmHg     RVOT Peak grad:   2 mmHg AV Mean Grad:      1.0 mmHg LVOT Vmax:         73.40 cm/s LVOT Vmean:        45.100 cm/s LVOT VTI:          0.099 m LVOT/AV VTI ratio: 1.38  AORTA Ao Root diam: 3.00 cm Ao Asc diam:  3.20 cm MITRAL VALVE MV Area (PHT): 6.80 cm    SHUNTS MV Area VTI:   2.77 cm    Systemic VTI:  0.10 m MV Peak grad:  3.4 mmHg    Systemic Diam: 2.20 cm MV Mean grad:  1.0 mmHg    Pulmonic VTI:  0.075 m MV Vmax:       0.92 m/s MV Vmean:      50.0 cm/s MV Decel Time: 112 msec MV E velocity: 42.70 cm/s MV A velocity: 91.70 cm/s MV E/A ratio:  0.47 Tefl Teacher signed by Caron Poser Signature Date/Time: 11/16/2024/3:20:31 PM    Final     PERFORMANCE STATUS (ECOG) : 1 - Symptomatic but completely ambulatory  Review of Systems Unless otherwise noted, a complete review of systems is negative.  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  Was previously followed at [facility] and transferred care locally.  She most recently started Doxil  chemotherapy and received cycle 1 on 11/22/2024.  She is now hospitalized  with abnormal LFTs and sepsis from pneumonia.  CTs show progressive hepatic metastatic disease.  Patient has been followed by [facility].  Patient states that she is symptomatically feeling improved today.  Feels her breathing is better.  Denies pain or other distressing symptoms at present.  Patient stated that she did not want to hear any bad news.  She also says that her husband left the hospital because he did not want to hear any bad news.  Patient does tell me that her understanding is that cancer is stage IV and incurable.  She says that this has been consistently told to her at [facility] and by our oncologists.  However, patient was recently told that her life expectancy was likely measured in months.  She strongly disagrees and says that she does not feel that she is dying.  She says that it is too early to tell if Doxil  has been effective and plans to continue receiving chemotherapy as long as there are any options available.  Patient cites her strong faith that God will heal her.  We discussed the probability of low response rates, particularly as patient progresses on multiple subsequent lines of chemotherapy.  We also discussed the importance of clarifying end-of-life decision making.  She says that she has talked some with family about her wishes.  She says that she wants to remain a full code but would not want her life prolonged long-term on machines.  We discussed the probable futility associated with resuscitative efforts in the setting of terminal cancer.  PLAN: - Continue current scope of treatment - MRI of the brain with and without contrast given progressive cancer - Patient desires full code/full scope - Will plan outpatient follow-up  Case and plan discussed with Dr. Rennie   Time Total: 30 minutes  Visit consisted of counseling and education dealing with the complex and emotionally intense issues of symptom management and palliative care in the setting of serious and  potentially life-threatening illness.Greater than 50%  of this time was spent counseling and coordinating care related to the above assessment and plan.  Signed by: Fonda Mower, PhD, NP-C   "

## 2024-11-28 NOTE — Progress Notes (Signed)
 Tara Hanna   DOB:1976-05-03   FM#:[id number]    Subjective: Patient resting comfortably.  Accompanied by multiple family members.  Notes to have further improvement of shortness of breath.  Objective:  Vitals:   11/28/24 2014 11/28/24 2015  BP:    Pulse:    Resp: (!) 25 (!) 22  Temp:    SpO2:       Intake/Output Summary (Last 24 hours) at 11/28/2024 2034 Last data filed at 11/28/2024 1000 Gross per 24 hour  Intake 690 ml  Output --  Net 690 ml    Physical Exam Vitals and nursing note reviewed.  HENT:     Head: Normocephalic and atraumatic.     Mouth/Throat:     Pharynx: Oropharynx is clear.  Eyes:     Extraocular Movements: Extraocular movements intact.     Pupils: Pupils are equal, round, and reactive to light.  Cardiovascular:     Rate and Rhythm: Regular rhythm. Tachycardia present.  Pulmonary:     Comments: Decreased breath sounds bilaterally.  Abdominal:     Palpations: Abdomen is soft.  Musculoskeletal:        General: Normal range of motion.     Cervical back: Normal range of motion.  Skin:    General: Skin is warm.  Neurological:     General: No focal deficit present.     Mental Status: She is alert and oriented to person, place, and time.  Psychiatric:        Behavior: Behavior normal.        Judgment: Judgment normal.      Labs:  Lab Results  Component Value Date   WBC 3.5 (L) 11/28/2024   HGB 9.3 (L) 11/28/2024   HCT 27.8 (L) 11/28/2024   MCV 73.5 (L) 11/28/2024   PLT 43 (L) 11/28/2024   NEUTROABS 5.0 11/26/2024    Lab Results  Component Value Date   NA 137 11/28/2024   K 4.0 11/28/2024   CL 100 11/28/2024   CO2 23 11/28/2024    Studies:  CT Angio Chest Pulmonary Embolism (PE) W or WO Contrast Result Date: 11/26/2024 EXAM: CTA CHEST 11/26/2024 11:37:29 PM TECHNIQUE: CTA of the chest was performed without and with the administration of 75 mL of iohexol  (OMNIPAQUE ) 350 MG/ML injection. Multiplanar reformatted images are provided for  review. MIP images are provided for review. Automated exposure control, iterative reconstruction, and/or weight based adjustment of the mA/kV was utilized to reduce the radiation dose to as low as reasonably achievable. COMPARISON: 11/26/2024 CLINICAL HISTORY: Pulmonary embolism (PE) suspected, high prob. FINDINGS: PULMONARY ARTERIES: Pulmonary arteries are adequately opacified for evaluation. No acute pulmonary embolus. Main pulmonary artery is normal in caliber. MEDIASTINUM: The heart and pericardium demonstrate no acute abnormality. There is no acute abnormality of the thoracic aorta. LYMPH NODES: Bilateral hilar and mediastinal adenopathy again noted, unchanged. LUNGS AND PLEURA: Left upper lobe nodular airspace disease as well as several small pulmonary nodules again noted, unchanged. Bibasilar linear scarring or atelectasis. No evidence of pleural effusion or pneumothorax. UPPER ABDOMEN: Numerous hepatic metastases, better seen on earlier CT. 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 No acute soft tissue abnormality. IMPRESSION: 1. No evidence of pulmonary embolism. 2. Stable left upper lobe nodular airspace disease and several small pulmonary nodules. 3. Stable bilateral hilar and mediastinal adenopathy. 4. Stable osteoblastic metastases in the T9 and  T11 vertebral bodies with slight depression through the superior endplates at both of these levels. 5. Numerous hepatic metastases, better seen on earlier CT. Electronically signed by: Franky Crease MD 11/26/2024 11:45 PM EST RP Workstation: [id number]    No problem-specific Assessment & Plan notes found for this encounter.  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  Too early to assess response to therapy.  See discussion below.  Will monitor blood counts closely-consider growth factor support if ANC less than 1.5.   # Metastatic breast cancer to the brain-concern for possible progression on recent imaging in November 2025 at [facility].  Clinically asymptomatic at this time-Will order MRI of the brain.  # Tachycardia/subjective shortness of breath/lactic acidosis Suspect secondary to underlying malignancy- possible infection/possible infiltrate on the CT scan.  On antibiotics as per primary team.  # Elevated LFTs-secondary to underlying malignancy.  # Unfortunately overall prognosis is extremely poor, and I had multiple discussions with the patient and family including husband.  However this seems to be denial.  Appreciate palliative care evaluation.    Cindy JONELLE Joe, MD 11/28/2024  8:34 PM

## 2024-11-28 NOTE — Progress Notes (Addendum)
 "    Progress Note    [name]  FMW:[id number] DOB: [birth year]  DOA: 11/26/2024 PCP: System, Provider Not In      Brief Narrative:    Medical records reviewed and are as summarized below:  Tara Hanna is a 49 y.o. female  with medical history of HTN, metastatic breast cancer with metastasis to liver, brain and bone on chemotherapy (last chemo about 4 days prior to admission).  Breast cancer was diagnosed in 2023 and she has recurrent and metastatic disease on palliative chemotherapy with doxorubicin  every 28 days.  Last chemo was on 11/22/2024.  She presented to the hospital with shortness of breath, abdominal and back pain.   She was diagnosed with possible severe sepsis secondary to community-acquired pneumonia.  CT chest abdomen and pelvis IMPRESSION: 1. Left upper lobe consolidation extending to the left hilum with strandy opacities in the lungs bilaterally, possible infiltrate, scarring, or worsening neoplastic process. 2. Interval increase in number and size of hepatic metastasis and pulmonary nodules. 3. Enlargement of mediastinal, hilar, and upper abdominal lymph adenopathy, likely representing metastatic disease. 4. Osteoblastic metastatic disease.     Assessment/Plan:   Principal Problem:   Sepsis due to pneumonia [facility]) Active Problems:   Cancer of right breast metastatic to brain [facility])   Essential hypertension   Palliative care encounter    Body mass index is 38.26 kg/m.  Open (class II obesity)    Severe sepsis secondary to community-acquired pneumonia, sinus tachycardia: Continue IV azithromycin  and ceftriaxone .  Check MRSA screen.  Follow-up blood cultures. Lactic acid down from 4.4-3.5-3.4. S/p treatment with IV fluids. She is tolerating room air  Persistent sinus tachycardia: Likely from underlying acute illness.  Suspect anxiety could be contributing to this.  Chart review shows she was tachycardic with heart rate of 126 at her  oncologist office on 11/22/2024.   Anemia of chronic disease: Possibly due to malignancy.  Recent chemotherapy could be contributing to anemia as well. Iron studies inconsistent with iron deficiency anemia.  Ferritin level greater than 7500, saturation ratio 49, iron level 107.  Worsening thrombocytopenia: Likely due to recent chemotherapy.  Platelet down from 61-43.  Monitor CBC   Progressive breast cancer with metastatic disease to the liver, brain, bone: Continue Keppra  for seizure prophylaxis. S/p 1 cycle of Doxil  on 11/22/2024 She has been seen by the oncologist and palliative care team.  Her prognosis is poor and she is optimistic   Elevated liver enzymes: Likely due to metastatic liver disease.   Hyponatremia: Improved.   Hypertension: HCTZ on hold.    Diet Order             Diet regular Room service appropriate? Yes; Fluid consistency: Thin  Diet effective now                                  Consultants: Oncologist Palliative care  Procedures: None    Medications:    Chlorhexidine  Gluconate Cloth  6 each Topical Daily   feeding supplement  237 mL Oral BID BM   heparin   5,000 Units Subcutaneous Q8H   levETIRAcetam   500 mg Oral BID   sodium chloride  flush  10-40 mL Intracatheter Q12H   Continuous Infusions:  azithromycin  Stopped (11/28/24 0121)   cefTRIAXone  (ROCEPHIN )  IV Stopped (11/28/24 0051)     Anti-infectives (From admission, onward)    Start     Dose/Rate Route Frequency Ordered  Stop   11/26/24 2015  cefTRIAXone  (ROCEPHIN ) 2 g in sodium chloride  0.9 % 100 mL IVPB        2 g 200 mL/hr over 30 Minutes Intravenous Every 24 hours 11/26/24 2006 12/01/24 2014   11/26/24 2015  azithromycin  (ZITHROMAX ) 500 mg in sodium chloride  0.9 % 250 mL IVPB        500 mg 250 mL/hr over 60 Minutes Intravenous Every 24 hours 11/26/24 2006 11/29/24 2159   11/26/24 1300  ceFEPIme  (MAXIPIME ) 2 g in sodium chloride  0.9 % 100 mL IVPB        2  g 200 mL/hr over 30 Minutes Intravenous  Once 11/26/24 1252 11/26/24 1525   11/26/24 1300  metroNIDAZOLE  (FLAGYL ) IVPB 500 mg        500 mg 100 mL/hr over 60 Minutes Intravenous  Once 11/26/24 1252 11/26/24 1433   11/26/24 1300  vancomycin  (VANCOCIN ) IVPB 1000 mg/200 mL premix        1,000 mg 200 mL/hr over 60 Minutes Intravenous  Once 11/26/24 1252 11/26/24 1630              Family Communication/Anticipated D/C date and plan/Code Status   DVT prophylaxis: heparin  injection 5,000 Units Start: 11/26/24 2200 SCDs Start: 11/26/24 1706     Code Status: Full Code  Family Communication: None Disposition Plan: Plan to discharge home   Status is: Observation The patient will require care spanning > 2 midnights and should be moved to inpatient because: Sepsis secondary to pneumonia       Subjective:   Interval events noted.  She still has a cough.  She says she feels better.  No chest pain, shortness of breath or palpitations.  Objective:    Vitals:   11/28/24 0819 11/28/24 0900 11/28/24 1000 11/28/24 1155  BP:  100/76 93/72   Pulse:  (!) 137    Resp:  20 20   Temp: 99 F (37.2 C) 98.8 F (37.1 C) 98.9 F (37.2 C) 98.6 F (37 C)  TempSrc:   Oral   SpO2:  95% 96%   Weight:      Height:       No data found.   Intake/Output Summary (Last 24 hours) at 11/28/2024 1405 Last data filed at 11/28/2024 1000 Gross per 24 hour  Intake 1590 ml  Output 300 ml  Net 1290 ml   Filed Weights   11/26/24 1242 11/28/24 0500  Weight: 99 kg 101.1 kg    Exam:   GEN: NAD SKIN: Warm and dry EYES: No pallor or icterus ENT: MMM CV: RRR, tachycardic PULM: Bibasilar rales.  No wheezing ABD: soft, ND, NT, +BS CNS: AAO x 3, non focal EXT: No edema or tenderness      Data Reviewed:   I have personally reviewed following labs and imaging studies:  Labs: Labs show the following:   Basic Metabolic Panel: Recent Labs  Lab 11/22/24 0815 11/26/24 1302 11/27/24 0349  11/28/24 0555  NA 143 138 133* 137  K 3.6 4.1 4.1 4.0  CL 104 97* 96* 100  CO2 22 24 21* 23  GLUCOSE 118* 103* 163* 152*  BUN 10 11 10 9   CREATININE 0.70 0.68 0.73 0.62  CALCIUM 9.1 9.5 8.3* 8.6*  MG  --  1.8  --   --    GFR Estimated Creatinine Clearance: 98.4 mL/min (by C-G formula based on SCr of 0.62 mg/dL). Liver Function Tests: Recent Labs  Lab 11/22/24 0815 11/26/24 1302 11/28/24 9444  AST 180* 617* 558*  ALT 302* 503* 452*  ALKPHOS 546* 759* 531*  BILITOT 0.7 1.0 1.0  PROT 6.6 7.0 5.9*  ALBUMIN 3.3* 3.4* 2.7*   No results for input(s): LIPASE, AMYLASE in the last 168 hours. No results for input(s): AMMONIA in the last 168 hours. Coagulation profile Recent Labs  Lab 11/26/24 1302  INR 1.0    CBC: Recent Labs  Lab 11/22/24 0815 11/26/24 1302 11/27/24 0349 11/28/24 0555  WBC 7.9 6.0 5.5 3.5*  NEUTROABS 6.0 5.0  --   --   HGB 11.1* 11.2* 10.4* 9.3*  HCT 34.8* 34.8* 31.7* 27.8*  MCV 75.3* 74.2* 73.5* 73.5*  PLT 133* 87* 61* 43*   Cardiac Enzymes: No results for input(s): CKTOTAL, CKMB, CKMBINDEX, TROPONINI in the last 168 hours. BNP (last 3 results) No results for input(s): PROBNP in the last 8760 hours. CBG: Recent Labs  Lab 11/27/24 0911 11/27/24 1555 11/27/24 2024 11/28/24 0815 11/28/24 1153  GLUCAP 148* 117* 135* 169* 110*   D-Dimer: Recent Labs    11/26/24 2237  DDIMER >20.00*   Hgb A1c: No results for input(s): HGBA1C in the last 72 hours. Lipid Profile: No results for input(s): CHOL, HDL, LDLCALC, TRIG, CHOLHDL, LDLDIRECT in the last 72 hours. Thyroid  function studies: No results for input(s): TSH, T4TOTAL, T3FREE, THYROIDAB in the last 72 hours.  Invalid input(s): FREET3 Anemia work up: Recent Labs    11/28/24 0555  FOLATE 9.3  FERRITIN >7,500*  TIBC 217*  IRON 107   Sepsis Labs: Recent Labs  Lab 11/22/24 0815 11/26/24 1302 11/26/24 1302 11/26/24 1457 11/26/24 2237  11/27/24 0349 11/28/24 0555  PROCALCITON  --   --   --   --  1.52  --   --   WBC 7.9 6.0  --   --   --  5.5 3.5*  LATICACIDVEN  --  4.4*   < > 3.2* 3.6* 3.5* 3.4*   < > = values in this interval not displayed.    Microbiology Recent Results (from the past 240 hours)  Culture, blood (Routine x 2)     Status: None (Preliminary result)   Collection Time: 11/26/24  1:01 PM   Specimen: BLOOD  Result Value Ref Range Status   Specimen Description BLOOD RIGHT ANTECUBITAL  Final   Special Requests   Final    BOTTLES DRAWN AEROBIC AND ANAEROBIC Blood Culture results may not be optimal due to an inadequate volume of blood received in culture bottles   Culture   Final    NO GROWTH 2 DAYS Performed at [facility], [address], [place], [place] [address]    Report Status PENDING  Incomplete  Resp panel by RT-PCR (RSV, Flu A&B, Covid) Anterior Nasal Swab     Status: None   Collection Time: 11/26/24  1:30 PM   Specimen: Anterior Nasal Swab  Result Value Ref Range Status   SARS Coronavirus 2 by RT PCR NEGATIVE NEGATIVE Final    Comment: (NOTE) SARS-CoV-2 target nucleic acids are NOT DETECTED.  The SARS-CoV-2 RNA is generally detectable in upper respiratory specimens during the acute phase of infection. The lowest concentration of SARS-CoV-2 viral copies this assay can detect is 138 copies/mL. A negative result does not preclude SARS-Cov-2 infection and should not be used as the sole basis for treatment or other patient management decisions. A negative result may occur with  improper specimen collection/handling, submission of specimen other than nasopharyngeal swab, presence of viral mutation(s) within the areas  targeted by this assay, and inadequate number of viral copies(<138 copies/mL). A negative result must be combined with clinical observations, patient history, and epidemiological information. The expected result is Negative.  Fact Sheet for Patients:   bloggercourse.com  Fact Sheet for Healthcare Providers:  seriousbroker.it  This test is no t yet approved or cleared by the [place]  FDA and  has been authorized for detection and/or diagnosis of SARS-CoV-2 by FDA under an Emergency Use Authorization (EUA). This EUA will remain  in effect (meaning this test can be used) for the duration of the COVID-19 declaration under Section 564(b)(1) of the Act, 21 U.S.C.section 360bbb-3(b)(1), unless the authorization is terminated  or revoked sooner.       Influenza A by PCR NEGATIVE NEGATIVE Final   Influenza B by PCR NEGATIVE NEGATIVE Final    Comment: (NOTE) The Xpert Xpress SARS-CoV-2/FLU/RSV plus assay is intended as an aid in the diagnosis of influenza from Nasopharyngeal swab specimens and should not be used as a sole basis for treatment. Nasal washings and aspirates are unacceptable for Xpert Xpress SARS-CoV-2/FLU/RSV testing.  Fact Sheet for Patients: bloggercourse.com  Fact Sheet for Healthcare Providers: seriousbroker.it  This test is not yet approved or cleared by the [place]  FDA and has been authorized for detection and/or diagnosis of SARS-CoV-2 by FDA under an Emergency Use Authorization (EUA). This EUA will remain in effect (meaning this test can be used) for the duration of the COVID-19 declaration under Section 564(b)(1) of the Act, 21 U.S.C. section 360bbb-3(b)(1), unless the authorization is terminated or revoked.     Resp Syncytial Virus by PCR NEGATIVE NEGATIVE Final    Comment: (NOTE) Fact Sheet for Patients: bloggercourse.com  Fact Sheet for Healthcare Providers: seriousbroker.it  This test is not yet approved or cleared by the [place]  FDA and has been authorized for detection and/or diagnosis of SARS-CoV-2 by FDA under an Emergency Use  Authorization (EUA). This EUA will remain in effect (meaning this test can be used) for the duration of the COVID-19 declaration under Section 564(b)(1) of the Act, 21 U.S.C. section 360bbb-3(b)(1), unless the authorization is terminated or revoked.  Performed at [facility], [address] [place], [place] [address]   Culture, blood (Routine x 2)     Status: None (Preliminary result)   Collection Time: 11/26/24  1:40 PM   Specimen: BLOOD  Result Value Ref Range Status   Specimen Description BLOOD BLOOD RIGHT HAND  Final   Special Requests   Final    BOTTLES DRAWN AEROBIC AND ANAEROBIC Blood Culture adequate volume   Culture   Final    NO GROWTH 2 DAYS Performed at [facility], [address], [place], [place] [address]    Report Status PENDING  Incomplete    Procedures and diagnostic studies:  CT Angio Chest Pulmonary Embolism (PE) W or WO Contrast Result Date: 11/26/2024 EXAM: CTA CHEST 11/26/2024 11:37:29 PM TECHNIQUE: CTA of the chest was performed without and with the administration of 75 mL of iohexol  (OMNIPAQUE ) 350 MG/ML injection. Multiplanar reformatted images are provided for review. MIP images are provided for review. Automated exposure control, iterative reconstruction, and/or weight based adjustment of the mA/kV was utilized to reduce the radiation dose to as low as reasonably achievable. COMPARISON: 11/26/2024 CLINICAL HISTORY: Pulmonary embolism (PE) suspected, high prob. FINDINGS: PULMONARY ARTERIES: Pulmonary arteries are adequately opacified for evaluation. No acute pulmonary embolus. Main pulmonary artery is normal in caliber. MEDIASTINUM: The heart and pericardium demonstrate no acute abnormality.  There is no acute abnormality of the thoracic aorta. LYMPH NODES: Bilateral hilar and mediastinal adenopathy again noted, unchanged. LUNGS AND PLEURA: Left upper lobe nodular airspace disease as well as several small pulmonary nodules again noted,  unchanged. Bibasilar linear scarring or atelectasis. No evidence of pleural effusion or pneumothorax. UPPER ABDOMEN: Numerous hepatic metastases, better seen on earlier CT. 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 No acute soft tissue abnormality. IMPRESSION: 1. No evidence of pulmonary embolism. 2. Stable left upper lobe nodular airspace disease and several small pulmonary nodules. 3. Stable bilateral hilar and mediastinal adenopathy. 4. 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 5. Numerous hepatic metastases, better seen on earlier CT. Electronically signed by: Franky Crease MD 11/26/2024 11:45 PM EST RP Workstation: [id number]   CT CHEST ABDOMEN PELVIS W CONTRAST Result Date: 11/26/2024 CLINICAL DATA:  Sepsis. Breast cancer with history of liver and spinal metastasis. EXAM: CT CHEST, ABDOMEN, AND PELVIS WITH CONTRAST TECHNIQUE: Multidetector CT imaging of the chest, abdomen and pelvis was performed following the standard protocol during bolus administration of intravenous contrast. RADIATION DOSE REDUCTION: This exam was performed according to the departmental dose-optimization program which includes automated exposure control, adjustment of the mA and/or kV according to patient size and/or use of iterative reconstruction technique. CONTRAST:  OMNIPAQUE  IOHEXOL  300 MG/ML  SOLN COMPARISON:  10/18/2024, 04/30/2008. FINDINGS: CT CHEST FINDINGS Cardiovascular: The heart is normal in size and there is no pericardial effusion. The distal tip of a right chest port terminates at the cavoatrial junction. The aorta and pulmonary trunk are normal in caliber. Mediastinum/Nodes: Enlarged lymph nodes are present in the mediastinum measuring up to 1.9 cm in the precarinal  space. There are prominent lymph nodes in the hilar regions bilaterally. Lungs/Pleura: Paraseptal and centrilobular emphysematous changes are noted in the lungs. There are scattered pulmonary nodules bilaterally, increased in size and number from the prior exam. For example there is an 8 mm nodule in the posterior segment of the left upper lobe, axial image 37, new from the previous exam. Subpleural consolidation is noted in the anterior aspect of the left upper lobe with communication of soft tissue fullness at the left hilum and, increased from the prior exam. Strandy opacities are noted at the lung bases, possible atelectasis, scarring, or infiltrate. No effusion or pneumothorax is seen. Musculoskeletal: Enlarged lymph nodes are noted in the left chest wall. Surgical clips in a small fluid collection is present in the left axilla measuring 2.6 cm, possible seroma and unchanged from the previous exam. Sclerotic lesions are noted in the thoracic spine, compatible with known metastatic disease. There are mild compression deformities in the superior endplates at T9 and T11, likely present on the prior exam. CT ABDOMEN PELVIS FINDINGS Hepatobiliary: Innumerable hypodense masses are present in the liver, the largest in the left lobe measuring 6.3 cm and increased from the prior exam. No biliary ductal dilatation. The gallbladder is without stones. There suggestion of gallbladder wall thickening, which may be related to local inflammatory changes in the liver. Pancreas: Unremarkable. No pancreatic ductal dilatation or surrounding inflammatory changes. Spleen: Normal in size without focal abnormality. Adrenals/Urinary Tract: The adrenal glands are stable. The kidneys enhance symmetrically. No renal calculus or hydronephrosis. The bladder is unremarkable. Stomach/Bowel: There is a small hiatal hernia.  The stomach is otherwise within normal limits. No bowel obstruction, free air, or pneumatosis is seen. Appendix appears  normal. Vascular/Lymphatic: No significant vascular findings are present. Enlarged lymph nodes are seen in the gastrohepatic ligament and porta hepatis. Reproductive: Status post hysterectomy. No adnexal masses. Other: No abdominopelvic ascites. A fat containing periumbilical hernia is present. Musculoskeletal: Degenerative changes are present in the lumbar spine. A sclerotic region is present in the proximal sacrum, possible metastatic disease. No acute fracture is seen. IMPRESSION: 1. Left upper lobe consolidation extending to the left hilum with strandy opacities in the lungs bilaterally, possible infiltrate, scarring, or worsening neoplastic process. 2. Interval increase in number and size of hepatic metastasis and pulmonary nodules. 3. Enlargement of mediastinal, hilar, and upper abdominal lymph adenopathy, likely representing metastatic disease. 4. Osteoblastic metastatic disease. Electronically Signed   By: Leita Birmingham M.D.   On: 11/26/2024 15:44               LOS: 1 day   Tara Hanna  [facility]   Pager on [email or web address]. If 7PM-7AM, please contact night-coverage at [email or web address]     11/28/2024, 2:05 PM           "

## 2024-11-28 NOTE — Progress Notes (Signed)
 "                                                                                                                                                                                               Palliative Care Progress Note, Assessment & Plan   Patient Name: Tara Hanna       Date: 11/28/2024 DOB: Aug 30, 1976  Age: 49 y.o. MRN#: [id number] Attending Physician: Tara Durand, MD Primary Care Physician: System, Provider Not In Admit Date: 11/26/2024  Subjective: Patient reports feeling better today.  She denies pain.  She has not eaten due to no appetite from stress.  She has not had a BM since she has been admitted but denies abdominal pain.  Denies shortness of breath or chest pain.  She denies weakness.  Patient shares Tara Hanna has visited her from cancer center today.  HPI: 49 y.o. female  with past medical history significant for HTN and metastatic breast cancer with metastasis to liver, brain and bone on chemotherapy. Patient presented to ED 11/26/2024 from home c/o abdominal distention/pain, back pain and DOE with shortness of breath at rest. Patient reports last chemo was 11/22/24.    ED labs significant for anion gap 17, Alk phos 759, albumin 3.4, AST 617, ALT 503. Lactic acid 4.4-->3.2-->3.6-->3.5.  Hgb 10.4, Hct 31.7, plts 61. D dimer >20   ED vitals 90/70, HR 136, SpO2 97% 2L, RR 24, 98.31F   CTA chest demonstrated: 1. No evidence of pulmonary embolism. 2. Stable left upper lobe nodular airspace disease and several small pulmonary nodules. 3. Stable bilateral hilar and mediastinal adenopathy. 4. 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 5. Numerous hepatic metastases, better seen on earlier CT.   TR[facility] was consulted for admission and management of severe sepsis 2/2 CAP, thrombocytopenia, anemia of chronic disease, elevated liver enzymes, hyponatremia and breast cancer with  metastasis to liver, bone and brain.    Palliative medicine team was consulted for assistance with goals of care conversations.   Summary of counseling/coordination of care: Extensive chart review completed prior to meeting patient including labs, vital signs, imaging, progress notes, orders, and available advanced directive documents from current and previous encounters.   After reviewing the patient's chart and assessing the patient at bedside, I spoke with patient in regards to symptom management and goals of care.      Latest Ref Rng & Units 11/28/2024    5:55 AM 11/27/2024    3:49 AM 11/26/2024    1:02 PM  CBC  WBC 4.0 -  10.5 K/uL 3.5  5.5  6.0   Hemoglobin 12.0 - 15.0 g/dL 9.3  89.5  88.7   Hematocrit 36.0 - 46.0 % 27.8  31.7  34.8   Platelets 150 - 400 K/uL 43  61  87       Latest Ref Rng & Units 11/28/2024    5:55 AM 11/27/2024    3:49 AM 11/26/2024    1:02 PM  CMP  Glucose 70 - 99 mg/dL 847  836  896   BUN 6 - 20 mg/dL 9  10  11    Creatinine 0.44 - 1.00 mg/dL 9.37  9.26  9.31   Sodium 135 - 145 mmol/L 137  133  138   Potassium 3.5 - 5.1 mmol/L 4.0  4.1  4.1   Chloride 98 - 111 mmol/L 100  96  97   CO2 22 - 32 mmol/L 23  21  24    Calcium 8.9 - 10.3 mg/dL 8.6  8.3  9.5   Total Protein 6.5 - 8.1 g/dL 5.9   7.0   Total Bilirubin 0.0 - 1.2 mg/dL 1.0   1.0   Alkaline Phos 38 - 126 U/L 531   759   AST 15 - 41 U/L 558   617   ALT 0 - 44 U/L 452   503     Ill-appearing female lying in bed with no family or staff at bedside.  She is alert and oriented to self, location, time and situation.  She is able to participate in conversation.  Respirations are even and unlabored.  Patient noted to have removed nasal cannula which was lying on her chest.  O2 on monitor shows 98% on room air.  She continues to be tachycardic.  She is in no distress. No symptom management needs identified at this time due to low symptom burden.  Liver enzymes with marginal improvement today.  Lactic acid persist  at 3.4.  WBC from 5.5 => 3.5 today.  Hgb 10.4 => 9.3.   Renal function within normal limits. Worsening thrombocytopenia noted from 61 => 43 Currently on seizure prophylaxis  Tara Hanna shares she has experienced a lot of stress after numerous conversations yesterday about her prognosis.  She shares frustration over hearing a 5 to 62-month timeframe and shares that it is in God's hands.  She expresses wanting to continue with treatment and states that she will even travel out of state if necessary to receive treatment for her cancer.  Patient shares that Tara Mower, NP visited her earlier today and has ordered MRI study.  Patient endorses need for anxiety medicine prior to scan.  RN to provide medication prior to study.  Therapeutic silence and active listening provided for patient to share her thoughts and emotions regarding current medical situation.  Emotional support provided.  Physical Exam Vitals reviewed.  Constitutional:      General: She is not in acute distress.    Appearance: She is obese. She is ill-appearing.  HENT:     Head: Normocephalic and atraumatic.     Mouth/Throat:     Mouth: Mucous membranes are moist.  Eyes:     Comments: Left eyelid droop  Cardiovascular:     Rate and Rhythm: Tachycardia present.  Pulmonary:     Effort: Pulmonary effort is normal. No respiratory distress.  Abdominal:     General: There is no distension.     Palpations: Abdomen is soft.     Tenderness: There is no abdominal tenderness. There is no  guarding.  Musculoskeletal:     Right lower leg: No edema.     Left lower leg: No edema.  Skin:    General: Skin is warm and dry.     Comments: Port in place to right upper chest  Neurological:     Mental Status: She is alert and oriented to person, place, and time.  Psychiatric:        Mood and Affect: Mood normal.        Behavior: Behavior normal.        Thought Content: Thought content normal.        Judgment: Judgment normal.      Recommendations/Plan: FULL CODE status as previously documented    Continue current supportive interventions Provide anxiety/pain medication as needed and prior to claustrophobic studies such as MRI Follow up with Tara Hanna for outpatient palliative care Per note, plan to d/c home when medically stable         Total Time 50 minutes   Discussed plan of care with Tara Mower, NP, Cancer Center  Time spent includes: Detailed review of medical records (labs, imaging, vital signs), medically appropriate exam (mental status, respiratory, cardiac, skin), discussed with treatment team, counseling and educating patient, family and staff, documenting clinical information, medication management and coordination of care.     Devere Sacks, ELNITA- [facility] Palliative Medicine Team  11/28/2024 10:28 AM  Office [phone]  Pager [phone]     "

## 2024-11-28 NOTE — Progress Notes (Signed)
"  ° °  I called patient Tara Hanna on 11-23-24 to discuss the updated Informed Consent (version date 09/14/24) for study TBCRC-066: Quantitative Immunofluorescence and/or RT-qPCR for Measuring HER2 in HER2-low Metastatic Breast Cancer.   The updates to the protocol were reviewed, including discussion of risks and benefits, that the treatment involves research (if applicable), review of charges covered/not covered by study, medications/treatments used (if applicable), procedures, confidentiality, time commitments involved, study contact list, the option to withdraw at any time and required use of birth control (if applicable). Alternatives to continued study participation were discussed.    The patient had ample time to consider participation in the study, in the absence of coercion or undue influence. Patient was offered an opportunity to ask questions and these questions were answered to their satisfaction.  This protocol amendment only requires verbal reconsent. Patient verbalized understanding of information presented.      "

## 2024-11-28 NOTE — Progress Notes (Signed)
 Paged Dr. Jens to notify of patient's heart rate sustaining in the 140's. Patient received IV lopressor  at 0540. RN administered additional PRN dose of IV lopressor  at 0800.    0850: Despite lopressor , patients heart rate remained in the mid to upper 130's. Dr. Jens re-notified. No new orders at this time. Will continue to monitor.

## 2024-11-29 ENCOUNTER — Inpatient Hospital Stay: Admitting: Nurse Practitioner

## 2024-11-29 ENCOUNTER — Inpatient Hospital Stay

## 2024-11-29 ENCOUNTER — Inpatient Hospital Stay: Admitting: Hospice and Palliative Medicine

## 2024-11-29 ENCOUNTER — Inpatient Hospital Stay
Admit: 2024-11-29 | Discharge: 2024-11-30 | Payer: MEDICAID | Attending: Radiation Oncology | Primary: Radiation Oncology

## 2024-11-29 DIAGNOSIS — D696 Thrombocytopenia, unspecified: Secondary | ICD-10-CM | POA: Diagnosis not present

## 2024-11-29 DIAGNOSIS — I513 Intracardiac thrombosis, not elsewhere classified: Secondary | ICD-10-CM | POA: Diagnosis not present

## 2024-11-29 DIAGNOSIS — J189 Pneumonia, unspecified organism: Secondary | ICD-10-CM | POA: Diagnosis not present

## 2024-11-29 DIAGNOSIS — C50919 Malignant neoplasm of unspecified site of unspecified female breast: Secondary | ICD-10-CM | POA: Diagnosis not present

## 2024-11-29 DIAGNOSIS — I6340 Cerebral infarction due to embolism of unspecified cerebral artery: Secondary | ICD-10-CM

## 2024-11-29 DIAGNOSIS — D6869 Other thrombophilia: Secondary | ICD-10-CM

## 2024-11-29 DIAGNOSIS — A419 Sepsis, unspecified organism: Secondary | ICD-10-CM | POA: Diagnosis not present

## 2024-11-29 DIAGNOSIS — C7931 Secondary malignant neoplasm of brain: Secondary | ICD-10-CM

## 2024-11-29 LAB — GLUCOSE, CAPILLARY
Glucose-Capillary: 108 mg/dL — ABNORMAL HIGH (ref 70–99)
Glucose-Capillary: 96 mg/dL (ref 70–99)
Glucose-Capillary: 79 mg/dL (ref 70–99)

## 2024-11-29 LAB — MRSA NEXT GEN BY PCR, NASAL: MRSA by PCR Next Gen: NOT DETECTED

## 2024-11-29 LAB — TYPE AND SCREEN
ABO/RH(D): O POS
Antibody Screen: NEGATIVE

## 2024-11-29 LAB — LACTIC ACID, PLASMA
Lactic Acid, Venous: 2.9 mmol/L (ref 0.5–1.9)
Lactic Acid, Venous: 3.3 mmol/L (ref 0.5–1.9)

## 2024-11-29 LAB — RENAL FUNCTION PANEL
Albumin: 2.6 g/dL — ABNORMAL LOW (ref 3.5–5.0)
Anion gap: 12 (ref 5–15)
BUN: 7 mg/dL (ref 6–20)
CO2: 24 mmol/L (ref 22–32)
Calcium: 8.3 mg/dL — ABNORMAL LOW (ref 8.9–10.3)
Chloride: 101 mmol/L (ref 98–111)
Creatinine, Ser: 0.57 mg/dL (ref 0.44–1.00)
GFR, Estimated: >60 mL/min
Glucose, Bld: 97 mg/dL (ref 70–99)
Phosphorus: 1.9 mg/dL — ABNORMAL LOW (ref 2.5–4.6)
Potassium: 3.8 mmol/L (ref 3.5–5.1)
Sodium: 137 mmol/L (ref 135–145)

## 2024-11-29 LAB — URINALYSIS, W/ REFLEX TO CULTURE (INFECTION SUSPECTED)
Bacteria, UA: NONE SEEN
Bilirubin Urine: NEGATIVE
Glucose, UA: NEGATIVE mg/dL
Hgb urine dipstick: NEGATIVE
Ketones, ur: NEGATIVE mg/dL
Leukocytes,Ua: NEGATIVE
Nitrite: NEGATIVE
Protein, ur: NEGATIVE mg/dL
Specific Gravity, Urine: 1.009 (ref 1.005–1.030)
pH: 6 (ref 5.0–8.0)

## 2024-11-29 LAB — CBC WITH DIFFERENTIAL/PLATELET
Abs Immature Granulocytes: 0.05 K/uL (ref 0.00–0.07)
Basophils Absolute: 0 K/uL (ref 0.0–0.1)
Basophils Relative: 1 %
Eosinophils Absolute: 0 K/uL (ref 0.0–0.5)
Eosinophils Relative: 1 %
HCT: 25.7 % — ABNORMAL LOW (ref 36.0–46.0)
Hemoglobin: 8.4 g/dL — ABNORMAL LOW (ref 12.0–15.0)
Immature Granulocytes: 2 %
Lymphocytes Relative: 13 %
Lymphs Abs: 0.4 K/uL — ABNORMAL LOW (ref 0.7–4.0)
MCH: 24.2 pg — ABNORMAL LOW (ref 26.0–34.0)
MCHC: 32.7 g/dL (ref 30.0–36.0)
MCV: 74.1 fL — ABNORMAL LOW (ref 80.0–100.0)
Monocytes Absolute: 0 K/uL — ABNORMAL LOW (ref 0.1–1.0)
Monocytes Relative: 1 %
Neutro Abs: 2.3 K/uL (ref 1.7–7.7)
Neutrophils Relative %: 82 %
Platelets: 26 K/uL — CL (ref 150–400)
RBC: 3.47 MIL/uL — ABNORMAL LOW (ref 3.87–5.11)
RDW: 21.7 % — ABNORMAL HIGH (ref 11.5–15.5)
Smear Review: NORMAL
WBC: 2.8 K/uL — ABNORMAL LOW (ref 4.0–10.5)
nRBC: 1.1 % — ABNORMAL HIGH (ref 0.0–0.2)

## 2024-11-29 LAB — MAGNESIUM: Magnesium: 1.8 mg/dL (ref 1.7–2.4)

## 2024-11-29 LAB — LACTATE DEHYDROGENASE: LDH: >2500 U/L — ABNORMAL HIGH (ref 105–235)

## 2024-11-29 LAB — ABO/RH: ABO/RH(D): O POS

## 2024-11-29 MED ORDER — SODIUM CHLORIDE 0.9 % IV SOLN
500.0000 mg | INTRAVENOUS | Status: DC
  Administered 2024-11-29: 500 mg via INTRAVENOUS
  Filled 2024-11-29: qty 5

## 2024-11-29 NOTE — Discharge Instructions (Signed)
 Some PCP options in [place] area- not a comprehensive list  [facility]- [phone] [facility]- [phone] [facility]- [phone] [facility]- [phone] [facility]- [phone] Nichole Molly- [phone]  or [facility] Physician Referral Line [phone]

## 2024-11-29 NOTE — Care Management Important Message (Signed)
 Important Message  Patient Details  Name: Tara Hanna MRN: [id number] Date of Birth: November 03, 1976   Important Message Given:  Yes - Medicare IM     [name] 11/29/2024, 12:27 PM

## 2024-11-29 NOTE — Consult Note (Signed)
 NEUROLOGY CONSULT NOTE   Date of service: November 29, 2024 Patient Name: Tara Hanna MRN:  [id number] DOB:  02/11/1976 Reason for Consultation: Acute stroke on MRI Requesting Provider: Jens Durand, MD  History of Present Illness  Tara Hanna is a 49 y.o. female with a PMHx of metastatic breast cancer (metastases to liver, brain and bone), on chemotherapy, uterine fibroids and HTN who presented to the hospital on Saturday 11/26/24 with SOB for the last few days in conjunction with abdominal bloating, abdominal pain and back pain. She was diagnosed with possible severe sepsis secondary to CAP. She was started on ABX. She has had no growth on blood cultures thus far. She has had persistently elevated lactic acid thought possibly to be due to metastatic disease involving her liver. She has also been diagnosed with severe thrombocytopenia. Platelets have trended downward and are 26 today.   MRI brain was obtained revealing an acute embolic strokes and brain metastatic disease. She is currently on Keppra  for seizure prophylaxis.   On bedside interview she denies any limb weakness. She is experiencing numbness to the 4th and fifth digits of her left hand. She denies any confusion or language difficulties.     ROS  As per HPI.   Past History   Past Medical History:  Diagnosis Date   Breast cancer (HCC)    Fibroids    Hypertension     Past Surgical History:  Procedure Laterality Date   ABDOMINAL HYSTERECTOMY     BREAST LUMPECTOMY Left 2024    Family History: Family History  Problem Relation Age of Onset   Diabetes Mother    Hypertension Mother    Prostate cancer Father    Clotting disorder Father    Cancer - Prostate Paternal Uncle        X3 BROTHERS   Heart failure Maternal Grandmother    Diabetes Maternal Grandmother     Social History  reports that she has quit smoking. Her smoking use included cigarettes. She has a 22 pack-year smoking history. She has never used  smokeless tobacco. She reports that she does not currently use alcohol after a past usage of about 2.0 standard drinks of alcohol per week. She reports current drug use. Frequency: 2.00 times per week. Drug: Marijuana.  Allergies[1]  Medications  Current Medications[2]  Vitals   Vitals:   11/29/24 1500 11/29/24 1600 11/29/24 1700 11/29/24 1800  BP: 104/80 103/75 109/81 107/76  Pulse:    (!) 125  Resp:  18 19 20   Temp:  98.3 F (36.8 C)    TempSrc:  Oral    SpO2:  93%  94%  Weight:      Height:        Body mass index is 38.26 kg/m.   Physical Exam   Constitutional: Appears well-developed and well-nourished.  Psych: Affect appropriate to situation.  Eyes: No scleral injection.  HENT: No OP obstruction.  Head: Normocephalic.  Respiratory: Effort normal, non-labored breathing.  Skin: WDI.   Neurologic Examination   Mental Status: Awake and alert. Fully oriented x 5. Thought content appropriate. Speech fluent without evidence of aphasia.  Able to follow all commands without difficulty. Cranial Nerves: II: PERRL. Temporal and nasal visual fields intact OU. Normal visual acuity right eye. Severely decreased visual acuity in left eye with difficulty counting fingers.  III,IV, VI: Severe left sided ptosis. EOMI bilaterally with conjugate eye movements. No nystagmus. V: Temp sensation equal bilaterally VII: Smile symmetric VIII: Hearing intact to voice  IX,X: No hypophonia or hoarseness XI: Symmetric XII: Midline tongue extension Motor: RUE: 5/5 LUE: 4+/5 subtle weakness relative to the right RLE: 5/5 LLE: 5/5 Sensory: Temp and FT intact x 4. No extinction to DSS. Deep Tendon Reflexes: 2+ and symmetric bilateral biceps, brachioradialis and patellae Cerebellar: No ataxia with FNF bilaterally Gait: Deferred   Labs/Imaging/Neurodiagnostic studies   CBC:  Recent Labs  Lab 22-Dec-2024 1302 11/27/24 0349 11/28/24 0555 11/29/24 0559  WBC 6.0   < > 3.5* 2.8*  NEUTROABS  5.0  --   --  2.3  HGB 11.2*   < > 9.3* 8.4*  HCT 34.8*   < > 27.8* 25.7*  MCV 74.2*   < > 73.5* 74.1*  PLT 87*   < > 43* 26*   < > = values in this interval not displayed.   Basic Metabolic Panel:  Lab Results  Component Value Date   NA 137 11/29/2024   K 3.8 11/29/2024   CO2 24 11/29/2024   GLUCOSE 97 11/29/2024   BUN 7 11/29/2024   CREATININE 0.57 11/29/2024   CALCIUM 8.3 (L) 11/29/2024   GFRNONAA >60 11/29/2024   GFRAA >60 11/04/2017   Lipid Panel:  Lab Results  Component Value Date   LDLCALC 96 01/04/2018   HgbA1c: No results found for: HGBA1C Urine Drug Screen: No results found for: LABOPIA, COCAINSCRNUR, LABBENZ, AMPHETMU, THCU, LABBARB  Alcohol Level No results found for: [facility] INR  Lab Results  Component Value Date   INR 1.0 12/22/24   Recent prior TTE (11/16/24):  1. Very technically difficult study.   2. Left ventricular ejection fraction, by estimation, is 50 to 55%. Left  ventricular ejection fraction by 2D MOD biplane is 51.9 %. The left  ventricle has low normal function. Left ventricular endocardial border not  optimally defined to evaluate regional wall motion. Indeterminate diastolic  filling due to E-A fusion.   3. Right ventricular systolic function is normal. The right ventricular  size is normal.   4. The mitral valve is normal in structure. No evidence of mitral valve  regurgitation. No evidence of mitral stenosis.   5. The aortic valve has an indeterminant number of cusps. Aortic valve  regurgitation is not visualized. No aortic stenosis is present.   ASSESSMENT  49 y.o. female with a PMHx of metastatic breast cancer (metastases to liver, brain and bone), on chemotherapy, uterine fibroids and HTN who presented to the hospital on Saturday 12/22/2024 with SOB for the last few days in conjunction with abdominal bloating, abdominal pain and back pain. She was diagnosed with possible severe sepsis secondary to CAP. She was started on ABX.  She has had no growth on blood cultures thus far. She has had persistently elevated lactic acid thought possibly to be due to metastatic disease involving her liver. She has also been diagnosed with severe thrombocytopenia. Platelets have trended downward and are 26 today. MRI brain was obtained revealing an acute embolic strokes and brain metastatic disease. She is currently on Keppra  for seizure prophylaxis.  - Exam reveals intact speech and language function in a fully oriented patient. There is severe left ptosis and decreased visual acuity OS. No focal motor weakness is noted. Mild sensory numbness to the tips of the fingers of her left hand is noted.  - MRI brain (personally reviewed): Positive for multiple scattered and small acute embolic infarcts in both the left cerebral and right cerebellar hemispheres. No associated acute hemorrhage or mass effect. Superimposed are five enhancing intracranial  metastases, with slight interval progression in the right cerebellum since outside MRI last month. Subtle right hemisphere pachymeningeal thickening and enhancement also appears increased since 2024, with underlying diffuse osseous metastatic disease  - Impression: - Acute multifocal embolic strokes. Her severe thrombocytopenia may make use of antiplatelets/anticoagulation a challenge. Hypercoagulable state in the context of her metastatic breast cancer is likely playing a role. Paradoxical embolization via a left to right cardiac shunt is on the DDx. Recent prior TTE did not reveal any structural abnormality predisposing to stroke, but no bubble study was performed.  - Metastatic brain disease. Five metastatic lesions are seen on MRI.    RECOMMENDATIONS  - Given that she is most likely at very high risk for recurrent strokes, felt most likely to be cardioembolic either via paradoxical embolization of clots via a PFO, versus a primary cardiac stroke, anticoagulation would be indicated if she did not have  severe thrombocytopenia. May not be able to treat until platelets recover back to normal given the high risk for hemorrhage.  - Would obtain TTE with bubble study. If negative, would obtain TEE - Assess for possible occult DVT - Cardiac telemetry - Oncology is following - BP management per standard protocol. The embolic strokes are small and therefore are not likely to have an appreciable hypoperfused penumbra - CTA of head and neck.  - Cardiac telemetry ______________________________________________________________________    Bonney SHARK, Treyson Axel, MD [facility]     [1] No Known Allergies [2]  Current Facility-Administered Medications:    acetaminophen  (TYLENOL ) tablet 650 mg, 650 mg, Oral, Q6H PRN **OR** acetaminophen  (TYLENOL ) suppository 650 mg, 650 mg, Rectal, Q6H PRN, Fernand Prost, MD   ALPRAZolam  (XANAX ) tablet 0.25 mg, 0.25 mg, Oral, Daily PRN, Jens Durand, MD, 0.25 mg at 11/28/24 1317   azithromycin  (ZITHROMAX ) 500 mg in sodium chloride  0.9 % 250 mL IVPB, 500 mg, Intravenous, Q24H, Ayiku, Bernard, MD   cefTRIAXone  (ROCEPHIN ) 2 g in sodium chloride  0.9 % 100 mL IVPB, 2 g, Intravenous, Q24H, Fernand Prost, MD, Last Rate: 200 mL/hr at 11/28/24 2100, 2 g at 11/28/24 2100   Chlorhexidine  Gluconate Cloth 2 % PADS 6 each, 6 each, Topical, Daily, Fernand Prost, MD, 6 each at 11/29/24 1002   feeding supplement (ENSURE PLUS HIGH PROTEIN) liquid 237 mL, 237 mL, Oral, BID BM, Jens Durand, MD, 237 mL at 11/29/24 9147   HYDROmorphone  (DILAUDID ) injection 0.5 mg, 0.5 mg, Intravenous, Q3H PRN, Fernand Prost, MD, 0.5 mg at 11/27/24 0040   HYDROmorphone  (DILAUDID ) tablet 2 mg, 2 mg, Oral, Q4H PRN, Fernand Prost, MD, 2 mg at 11/29/24 1657   ipratropium-albuterol  (DUONEB) 0.5-2.5 (3) MG/3ML nebulizer solution 3 mL, 3 mL, Nebulization, Q6H PRN, Fernand Prost, MD   levETIRAcetam  (KEPPRA ) tablet 500 mg, 500 mg, Oral, BID, Ayiku, Bernard, MD, 500 mg at 11/29/24 9148   ondansetron   (ZOFRAN ) tablet 4 mg, 4 mg, Oral, Q6H PRN **OR** ondansetron  (ZOFRAN ) injection 4 mg, 4 mg, Intravenous, Q6H PRN, Fernand Prost, MD   Oral care mouth rinse, 15 mL, Mouth Rinse, PRN, Jens Durand, MD   senna-docusate (Senokot-S) tablet 1 tablet, 1 tablet, Oral, QHS PRN, Fernand Prost, MD   sodium chloride  flush (NS) 0.9 % injection 10-40 mL, 10-40 mL, Intracatheter, Q12H, Fernand Prost, MD, 20 mL at 11/29/24 0852   sodium chloride  flush (NS) 0.9 % injection 10-40 mL, 10-40 mL, Intracatheter, PRN, Fernand Prost, MD

## 2024-11-29 NOTE — TOC Initial Note (Signed)
 Transition of Care [facility]) - Initial/Assessment Note    Patient Details  Name: Tara Hanna MRN: [id number] Date of Birth: [birth year]  Transition [facility] Care [facility]) CM/SW Contact:    [name], LCSW Phone Number: 11/29/2024, 10:26 AM  Clinical Narrative: Readmission prevention screen complete. CSW met with patient. No family at bedside. CSW introduced role and explained that discharge planning would be discussed. PCP is her oncologist at the cancer center, Dr. Rennie. She drives herself to appointments. [facility]acy [facility] [place]. No issues affording medications. Patient lives home with her husband, daughter, and two grandchildren. No home health or DME use prior to admission. Patient is currently on acute oxygen. Will follow for this potential discharge need. No further concerns. CSW will continue to follow patient for support and facilitate return home once stable. Husband will transport her home at discharge.                  Expected Discharge Plan: Home/Self Care Barriers to Discharge: Continued Medical Work up   Patient Goals and CMS Choice            Expected Discharge Plan and Services     Post Acute Care Choice: NA Living arrangements for the past 2 months: Single Family Home                                      Prior Living Arrangements/Services Living arrangements for the past 2 months: Single Family Home Lives with:: Adult Children, Spouse, Relatives Patient language and need for interpreter reviewed:: Yes Do you feel safe going back to the place where you live?: Yes      Need for Family Participation in Patient Care: Yes (Comment) Care giver support system in place?: Yes (comment)   Criminal Activity/Legal Involvement Pertinent to Current Situation/Hospitalization: No - Comment as needed  Activities of Daily Living   ADL Screening (condition at time of admission) Independently performs ADLs?: Yes (appropriate for developmental age) Is the  patient deaf or have difficulty hearing?: No Does the patient have difficulty seeing, even when wearing glasses/contacts?: No Does the patient have difficulty concentrating, remembering, or making decisions?: No  Permission Sought/Granted                  Emotional Assessment Appearance:: Appears stated age Attitude/Demeanor/Rapport: Engaged, Gracious Affect (typically observed): Accepting, Appropriate, Calm, Pleasant Orientation: : Oriented to Self, Oriented to Place, Oriented to  Time, Oriented to Situation Alcohol / Substance Use: Not Applicable Psych Involvement: No (comment)  Admission diagnosis:  Malignant neoplasm metastatic to breast (HCC) [C79.81] Sepsis due to pneumonia (HCC) [J18.9, A41.9] Pneumonia of left upper lobe due to infectious organism [J18.9] Patient Active Problem List   Diagnosis Date Noted   Palliative care encounter 11/28/2024   Essential hypertension 11/26/2024   Sepsis due to pneumonia (HCC) 11/26/2024   Cancer of right breast metastatic to brain [facility]) 11/08/2024   Polysubstance abuse (HCC) 12/30/2017   Abnormal EKG 12/30/2017   Atypical chest pain 12/30/2017   PCP:  System, Provider Not In Pharmacy:   [id number] [place], [place] - [address] [place] [address] Phone: [phone] Fax: [phone]  [id number] - [place], [place] - [address] [place] [address] Phone: [phone] Fax: [phone]     Social Drivers of Health (SDOH) Social History: SDOH Screenings  Food Insecurity: No Food Insecurity (11/27/2024)  Housing: Unknown (11/27/2024)  Transportation Needs: No Transportation Needs (11/27/2024)  Utilities: Not At Risk (11/27/2024)  Recent Concern: Utilities - At Risk (11/08/2024)  Depression (PHQ2-9): Low Risk (11/08/2024)  Financial Resource Strain: High Risk (03/21/2022)   [facility]  Tobacco Use: Medium Risk (11/26/2024)   SDOH Interventions:      Readmission Risk Interventions    11/29/2024   10:25 AM  Readmission Risk Prevention Plan  Transportation Screening Complete  PCP or Specialist Appt within 5-7 Days Complete  Medication Review (RN CM) Complete

## 2024-11-29 NOTE — Progress Notes (Signed)
 Tara Hanna   DOB:Jun 29, 1976   FM#:[id number]    Subjective: Patient resting comfortably.  Patient is alone..  Notes to have further improvement of shortness of breath.  Denies any worsening neurologic deficits.  Objective:  Vitals:   11/29/24 1137 11/29/24 1200  BP:  111/83  Pulse:  (!) 122  Resp:    Temp: 98.6 F (37 C)   SpO2:  92%    No intake or output data in the 24 hours ending 11/29/24 1411   Physical Exam Vitals and nursing note reviewed.  HENT:     Head: Normocephalic and atraumatic.     Mouth/Throat:     Pharynx: Oropharynx is clear.  Eyes:     Extraocular Movements: Extraocular movements intact.     Pupils: Pupils are equal, round, and reactive to light.  Cardiovascular:     Rate and Rhythm: Regular rhythm. Tachycardia present.  Pulmonary:     Comments: Decreased breath sounds bilaterally.  Abdominal:     Palpations: Abdomen is soft.  Musculoskeletal:        General: Normal range of motion.     Cervical back: Normal range of motion.  Skin:    General: Skin is warm.  Neurological:     General: No focal deficit present.     Mental Status: She is alert and oriented to person, place, and time.  Psychiatric:        Behavior: Behavior normal.        Judgment: Judgment normal.      Labs:  Lab Results  Component Value Date   WBC 2.8 (L) 11/29/2024   HGB 8.4 (L) 11/29/2024   HCT 25.7 (L) 11/29/2024   MCV 74.1 (L) 11/29/2024   PLT 26 (LL) 11/29/2024   NEUTROABS 2.3 11/29/2024    Lab Results  Component Value Date   NA 137 11/29/2024   K 3.8 11/29/2024   CL 101 11/29/2024   CO2 24 11/29/2024    Studies:  MR BRAIN W WO CONTRAST Result Date: 11/29/2024 EXAM: MRI BRAIN WITH AND WITHOUT CONTRAST 11/28/2024 02:20:35 PM TECHNIQUE: Multiplanar multisequence MRI of the head/brain was performed with and without the administration of intravenous contrast. CONTRAST: 10 mL gadobutrol  (GADAVIST ) 1 MMOL/ML injection 10 mL GADOBUTROL  1 MMOL/ML IV SOLN.  COMPARISON: MR Head 11/02/2023, outside brain MRI 11/08/2024. CLINICAL HISTORY: 49 year old female with breast cancer, metastatic disease evaluation. Metastatic disease on CT chest, abdomen, and pelvis recently. Known brain metastases on MRI last year. FINDINGS: BRAIN AND VENTRICLES: No acute intracranial hemorrhage. No mass effect or midline shift. No hydrocephalus. The sella is unremarkable. Normal flow voids. The dural venous sinuses are enhancing and appear patent. nonenhancing scattered foci of restricted diffusion which are generally subcentimeter in both the right cerebellar hemisphere (series 10 images 13 through 16), in the medial left thalamus on series 10 image 31, and scattered in the posterior superior left frontal lobe on series 10 image 45 minimal associated T2 and FLAIR hyperintensity. On SWI mild chronic hemosiderin associated with the metastatic lesions is stable. No acute intracranial hemorrhage or mass effect. Multiple Brain Lesions: 1. Small nodular and solid enhancing 6 mm lesion right inferior cerebellum on series 20 image 37 appears slightly larger on both axial and coronal postcontrast images, up to 6 mm now versus 4 to 5 mm last month. No regional edema or mass effect. 2. Nearby 2 additional punctate enhancing bilateral inferior cerebellar metastases on all 3 postcontrast imaging sequences (series 20 images 35 and 39), and the lesion  on image 35 in the right hemisphere appears increased from last month. 3. Posterior right hemisphere partially solid and nodular enhancing metastases on series 20 image 103 measuring 18 mm long axis appears stable. Regional T2 and FLAIR hyperintensity also stable and no significant regional mass effect. 4. Posterior right frontal operculum 13 mm enhancing nodular, partially solid metastasis appears stable on series 20 image 116. Mild regional T2 and FLAIR hyperintensity appears stable without mass effect. No convincing leptomeningeal enhancement. Small  developmental venous anomaly (normal variant) suspected in the left occipital lobe on series 20 image 87 and stable from 2024. Borderline to mild asymmetric right hemisphere dural pachymeningeal thickening also noted (series 20 image 102), and appears new since 2024 but not significantly changed from last month. No areas of pachymeningeal nodularity. No convincing leptomeningeal enhancement. ORBITS: No acute abnormality. SINUSES: No acute abnormality. BONES AND SOFT TISSUES: Progressive bone marrow signal heterogeneity since 2024 compatible with widespread osseous metastatic disease. No destructive osseous lesion identified. Numerous small nonspecific FLAIR hyperintense scalp nodules which are mostly new since 2024, not significantly changed from last month and nonspecific. Grossly negative visible spinal cord. IMPRESSION: 1. 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 No associated acute hemorrhage or mass effect. 2. Superimposed Five enhancing intracranial metastases, with slight interval progression in the right cerebellum since outside MRI last month. Subtle right hemisphere pachymeningeal thickening and enhancement also appears increased since 2024, with underlying diffuse osseous metastatic disease Electronically signed by: Helayne Hurst MD 11/29/2024 04:22 AM EST RP Workstation: [id number]    No problem-specific Assessment & Plan notes found for this encounter.  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  Too early to assess response to therapy.    # Worsening thrombocytopenia likely secondary to chemotherapy-hold off any transfusion unless active bleeding or if  patient needs antiplatelet therapy see below.  # Metastatic breast cancer to the brain-concern for possible progression on recent imaging in November 2025 at [facility].  Clinically asymptomatic at this time-November 29, 2023 brain MRI shows progressive brain lesions; and also evidence of strokes.  Recommend evaluation with radiation oncology discussed with Dr. Camelia.  Also recommend evaluation with neurology regarding stroke.  Given low platelets-antiplatelet therapy would be challenging.  # Tachycardia/subjective shortness of breath/lactic acidosis Suspect secondary to underlying malignancy- possible infection/possible infiltrate on the CT scan.  On antibiotics as per primary team.  # Elevated LFTs-secondary to underlying malignancy.  # I reviewed with the patient overall poor prognosis-however she is adamant that she would want to continue current scope of care-include evaluation with radiation oncology/neurology.    Cindy JONELLE Joe, MD 11/29/2024  2:11 PM

## 2024-11-29 NOTE — Progress Notes (Addendum)
 "    Progress Note    Tara Hanna  FMW:[id number] DOB: 07-22-1976  DOA: 11/26/2024 PCP: System, Provider Not In      Brief Narrative:    Medical records reviewed and are as summarized below:  Tara Hanna is a 49 y.o. female  with medical history of HTN, metastatic breast cancer with metastasis to liver, brain and bone on chemotherapy (last chemo about 4 days prior to admission).  Breast cancer was diagnosed in 2023 and she has recurrent and metastatic disease on palliative chemotherapy with doxorubicin  every 28 days.  Last chemo was on 11/22/2024.  She presented to the hospital with shortness of breath, abdominal and back pain.   She was diagnosed with possible severe sepsis secondary to community-acquired pneumonia.  CT chest abdomen and pelvis IMPRESSION: 1. Left upper lobe consolidation extending to the left hilum with strandy opacities in the lungs bilaterally, possible infiltrate, scarring, or worsening neoplastic process. 2. Interval increase in number and size of hepatic metastasis and pulmonary nodules. 3. Enlargement of mediastinal, hilar, and upper abdominal lymph adenopathy, likely representing metastatic disease. 4. Osteoblastic metastatic disease.     Assessment/Plan:   Principal Problem:   Sepsis due to pneumonia [facility]) Active Problems:   Cancer of right breast metastatic to brain [facility])   Essential hypertension   Palliative care encounter    Body mass index is 38.26 kg/m.  Open (class II obesity)    Severe sepsis secondary to community-acquired pneumonia, sinus tachycardia: Continue IV ceftriaxone  and azithromycin .  MRSA screen is pending.  No growth on blood cultures thus far.   Lactic acid down from 4.4-3.5-3.4-2.9 -3.3. Persistently elevated lactic acid may be due to metastatic disease to the liver S/p treatment with IV fluids.   Acute hypoxic respiratory failure: Oxygen saturation dropped to mid 80s on room air.  Continue 2 L oxygen via  [place].   Persistent sinus tachycardia: Likely from underlying acute illness.  Suspect anxiety could be contributing to this.  Chart review shows she was tachycardic with heart rate of 126 at her oncologist office on 11/22/2024.   Anemia of chronic disease, pancytopenia: Possibly due to malignancy.  Recent chemotherapy likely contributed to pancytopenia WBC down to 2.8, hemoglobin down to 8.4 and platelet down to 26. Iron studies inconsistent with iron deficiency anemia.  Ferritin level greater than 7500, saturation ratio 49, iron level 107.   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 Lindzen, neurologist, to assist with management.  Severe thrombocytopenia may make use of antiplatelets/anticoagulation a challenge.   Progressive breast cancer with metastatic disease to the liver, brain, bone: Continue Keppra  for seizure prophylaxis. S/p 1 cycle of Doxil  on 11/22/2024 Case discussed with Dr. Rennie, oncologist.  He said patient is interested in radiation therapy for brain metastasis so he has reached out to Dr. Lenn, radiation oncologist to assist with management.   Elevated liver enzymes: Likely due to metastatic liver disease.   Hyponatremia: Improved.   Hypertension: HCTZ on hold because BP is soft.   Prognosis is poor.   Diet Order             Diet regular Room service appropriate? Yes; Fluid consistency: Thin  Diet effective now                                  Consultants: Oncologist Palliative care Neurologist  Procedures: None    Medications:  Chlorhexidine  Gluconate Cloth  6 each Topical Daily   feeding supplement  237 mL Oral BID BM   levETIRAcetam   500 mg Oral BID   sodium chloride  flush  10-40 mL Intracatheter Q12H   Continuous Infusions:  azithromycin      cefTRIAXone  (ROCEPHIN )  IV 2 g (11/28/24 2100)     Anti-infectives (From admission, onward)    Start     Dose/Rate Route  Frequency Ordered Stop   11/29/24 2200  azithromycin  (ZITHROMAX ) 500 mg in sodium chloride  0.9 % 250 mL IVPB        500 mg 250 mL/hr over 60 Minutes Intravenous Every 24 hours 11/29/24 1304 12/01/24 2159   11/26/24 2015  cefTRIAXone  (ROCEPHIN ) 2 g in sodium chloride  0.9 % 100 mL IVPB        2 g 200 mL/hr over 30 Minutes Intravenous Every 24 hours 11/26/24 2006 12/01/24 2014   11/26/24 2015  azithromycin  (ZITHROMAX ) 500 mg in sodium chloride  0.9 % 250 mL IVPB        500 mg 250 mL/hr over 60 Minutes Intravenous Every 24 hours 11/26/24 2006 11/29/24 0002   11/26/24 1300  ceFEPIme  (MAXIPIME ) 2 g in sodium chloride  0.9 % 100 mL IVPB        2 g 200 mL/hr over 30 Minutes Intravenous  Once 11/26/24 1252 11/26/24 1525   11/26/24 1300  metroNIDAZOLE  (FLAGYL ) IVPB 500 mg        500 mg 100 mL/hr over 60 Minutes Intravenous  Once 11/26/24 1252 11/26/24 1433   11/26/24 1300  vancomycin  (VANCOCIN ) IVPB 1000 mg/200 mL premix        1,000 mg 200 mL/hr over 60 Minutes Intravenous  Once 11/26/24 1252 11/26/24 1630              Family Communication/Anticipated D/C date and plan/Code Status   DVT prophylaxis: Place and maintain sequential compression device Start: 11/29/24 0851 SCDs Start: 11/26/24 1706     Code Status: Full Code  Family Communication: None Disposition Plan: Plan to discharge home   Status is: Inpatient Remains inpatient appropriate because: Sepsis secondary to pneumonia, stage IV breast cancer and acute stroke         Subjective:   She says she feels fine.  She only reports a mild cough.  No chest pain or shortness of breath.  Objective:    Vitals:   11/29/24 1000 11/29/24 1100 11/29/24 1137 11/29/24 1200  BP: 96/73 98/74  111/83  Pulse:  (!) 116  (!) 122  Resp: 20     Temp:   98.6 F (37 C)   TempSrc:   Oral   SpO2:  95%  92%  Weight:      Height:       No data found.  No intake or output data in the 24 hours ending 11/29/24 1304  Filed Weights    11/26/24 1242 11/28/24 0500 11/29/24 0500  Weight: 99 kg 101.1 kg 101.1 kg    Exam:  GEN: NAD SKIN: Warm and dry EYES: No pallor or icterus ENT: MMM CV: RRR, tachycardic PULM: CTA B ABD: soft, ND, NT, +BS CNS: AAO x 3, non focal EXT: No edema or tenderness       Data Reviewed:   I have personally reviewed following labs and imaging studies:  Labs: Labs show the following:   Basic Metabolic Panel: Recent Labs  Lab 11/26/24 1302 11/27/24 0349 11/28/24 0555 11/29/24 0559  NA 138 133* 137 137  K 4.1 4.1  4.0 3.8  CL 97* 96* 100 101  CO2 24 21* 23 24  GLUCOSE 103* 163* 152* 97  BUN 11 10 9 7   CREATININE 0.68 0.73 0.62 0.57  CALCIUM 9.5 8.3* 8.6* 8.3*  MG 1.8  --   --  1.8  PHOS  --   --   --  1.9*   GFR Estimated Creatinine Clearance: 98.4 mL/min (by C-G formula based on SCr of 0.57 mg/dL). Liver Function Tests: Recent Labs  Lab 11/26/24 1302 11/28/24 0555 11/29/24 0559  AST 617* 558*  --   ALT 503* 452*  --   ALKPHOS 759* 531*  --   BILITOT 1.0 1.0  --   PROT 7.0 5.9*  --   ALBUMIN 3.4* 2.7* 2.6*   No results for input(s): LIPASE, AMYLASE in the last 168 hours. No results for input(s): AMMONIA in the last 168 hours. Coagulation profile Recent Labs  Lab 11/26/24 1302  INR 1.0    CBC: Recent Labs  Lab 11/26/24 1302 11/27/24 0349 11/28/24 0555 11/29/24 0559  WBC 6.0 5.5 3.5* 2.8*  NEUTROABS 5.0  --   --  2.3  HGB 11.2* 10.4* 9.3* 8.4*  HCT 34.8* 31.7* 27.8* 25.7*  MCV 74.2* 73.5* 73.5* 74.1*  PLT 87* 61* 43* 26*   Cardiac Enzymes: No results for input(s): CKTOTAL, CKMB, CKMBINDEX, TROPONINI in the last 168 hours. BNP (last 3 results) No results for input(s): PROBNP in the last 8760 hours. CBG: Recent Labs  Lab 11/28/24 1153 11/28/24 1600 11/28/24 2014 11/29/24 0820 11/29/24 1135  GLUCAP 110* 145* 117* 108* 96   D-Dimer: Recent Labs    11/26/24 2237  DDIMER >20.00*   Hgb A1c: No results for input(s):  HGBA1C in the last 72 hours. Lipid Profile: No results for input(s): CHOL, HDL, LDLCALC, TRIG, CHOLHDL, LDLDIRECT in the last 72 hours. Thyroid  function studies: No results for input(s): TSH, T4TOTAL, T3FREE, THYROIDAB in the last 72 hours.  Invalid input(s): FREET3 Anemia work up: Recent Labs    11/28/24 0555  FOLATE 9.3  FERRITIN >7,500*  TIBC 217*  IRON 107   Sepsis Labs: Recent Labs  Lab 11/26/24 1302 11/26/24 1457 11/26/24 2237 11/27/24 0349 11/28/24 0555 11/29/24 0100 11/29/24 0559  PROCALCITON  --   --  1.52  --   --   --   --   WBC 6.0  --   --  5.5 3.5*  --  2.8*  LATICACIDVEN 4.4*   < > 3.6* 3.5* 3.4* 2.9* 3.3*   < > = values in this interval not displayed.    Microbiology Recent Results (from the past 240 hours)  Culture, blood (Routine x 2)     Status: None (Preliminary result)   Collection Time: 11/26/24  1:01 PM   Specimen: BLOOD  Result Value Ref Range Status   Specimen Description BLOOD RIGHT ANTECUBITAL  Final   Special Requests   Final    BOTTLES DRAWN AEROBIC AND ANAEROBIC Blood Culture results may not be optimal due to an inadequate volume of blood received in culture bottles   Culture   Final    NO GROWTH 3 DAYS Performed at [facility], [address] [place], [place] [address]    Report Status PENDING  Incomplete  Resp panel by RT-PCR (RSV, Flu A&B, Covid) Anterior Nasal Swab     Status: None   Collection Time: 11/26/24  1:30 PM   Specimen: Anterior Nasal Swab  Result Value Ref Range Status   SARS  Coronavirus 2 by RT PCR NEGATIVE NEGATIVE Final    Comment: (NOTE) SARS-CoV-2 target nucleic acids are NOT DETECTED.  The SARS-CoV-2 RNA is generally detectable in upper respiratory specimens during the acute phase of infection. The lowest concentration of SARS-CoV-2 viral copies this assay can detect is 138 copies/mL. A negative result does not preclude SARS-Cov-2 infection and should not be used as the  sole basis for treatment or other patient management decisions. A negative result may occur with  improper specimen collection/handling, submission of specimen other than nasopharyngeal swab, presence of viral mutation(s) within the areas targeted by this assay, and inadequate number of viral copies(<138 copies/mL). A negative result must be combined with clinical observations, patient history, and epidemiological information. The expected result is Negative.  Fact Sheet for Patients:  bloggercourse.com  Fact Sheet for Healthcare Providers:  seriousbroker.it  This test is no t yet approved or cleared by the [place]  FDA and  has been authorized for detection and/or diagnosis of SARS-CoV-2 by FDA under an Emergency Use Authorization (EUA). This EUA will remain  in effect (meaning this test can be used) for the duration of the COVID-19 declaration under Section 564(b)(1) of the Act, 21 U.S.C.section 360bbb-3(b)(1), unless the authorization is terminated  or revoked sooner.       Influenza A by PCR NEGATIVE NEGATIVE Final   Influenza B by PCR NEGATIVE NEGATIVE Final    Comment: (NOTE) The Xpert Xpress SARS-CoV-2/FLU/RSV plus assay is intended as an aid in the diagnosis of influenza from Nasopharyngeal swab specimens and should not be used as a sole basis for treatment. Nasal washings and aspirates are unacceptable for Xpert Xpress SARS-CoV-2/FLU/RSV testing.  Fact Sheet for Patients: bloggercourse.com  Fact Sheet for Healthcare Providers: seriousbroker.it  This test is not yet approved or cleared by the [place]  FDA and has been authorized for detection and/or diagnosis of SARS-CoV-2 by FDA under an Emergency Use Authorization (EUA). This EUA will remain in effect (meaning this test can be used) for the duration of the COVID-19 declaration under Section 564(b)(1) of the  Act, 21 U.S.C. section 360bbb-3(b)(1), unless the authorization is terminated or revoked.     Resp Syncytial Virus by PCR NEGATIVE NEGATIVE Final    Comment: (NOTE) Fact Sheet for Patients: bloggercourse.com  Fact Sheet for Healthcare Providers: seriousbroker.it  This test is not yet approved or cleared by the [place]  FDA and has been authorized for detection and/or diagnosis of SARS-CoV-2 by FDA under an Emergency Use Authorization (EUA). This EUA will remain in effect (meaning this test can be used) for the duration of the COVID-19 declaration under Section 564(b)(1) of the Act, 21 U.S.C. section 360bbb-3(b)(1), unless the authorization is terminated or revoked.  Performed at [facility], [address] [place], [place] [address]   Culture, blood (Routine x 2)     Status: None (Preliminary result)   Collection Time: 11/26/24  1:40 PM   Specimen: BLOOD  Result Value Ref Range Status   Specimen Description BLOOD BLOOD RIGHT HAND  Final   Special Requests   Final    BOTTLES DRAWN AEROBIC AND ANAEROBIC Blood Culture adequate volume   Culture   Final    NO GROWTH 3 DAYS Performed at [facility], [address] [place], [place] [address]    Report Status PENDING  Incomplete    Procedures and diagnostic studies:  MR BRAIN W WO CONTRAST Result Date: 11/29/2024 EXAM: MRI BRAIN WITH AND WITHOUT CONTRAST 11/28/2024 02:20:35 PM TECHNIQUE:  Multiplanar multisequence MRI of the head/brain was performed with and without the administration of intravenous contrast. CONTRAST: 10 mL gadobutrol  (GADAVIST ) 1 MMOL/ML injection 10 mL GADOBUTROL  1 MMOL/ML IV SOLN. COMPARISON: MR Head 11/02/2023, outside brain MRI 11/08/2024. CLINICAL HISTORY: 49 year old female with breast cancer, metastatic disease evaluation. Metastatic disease on CT chest, abdomen, and pelvis recently. Known brain metastases on MRI last year. FINDINGS:  BRAIN AND VENTRICLES: No acute intracranial hemorrhage. No mass effect or midline shift. No hydrocephalus. The sella is unremarkable. Normal flow voids. The dural venous sinuses are enhancing and appear patent. nonenhancing scattered foci of restricted diffusion which are generally subcentimeter in both the right cerebellar hemisphere (series 10 images 13 through 16), in the medial left thalamus on series 10 image 31, and scattered in the posterior superior left frontal lobe on series 10 image 45 minimal associated T2 and FLAIR hyperintensity. On SWI mild chronic hemosiderin associated with the metastatic lesions is stable. No acute intracranial hemorrhage or mass effect. Multiple Brain Lesions: 1. Small nodular and solid enhancing 6 mm lesion right inferior cerebellum on series 20 image 37 appears slightly larger on both axial and coronal postcontrast images, up to 6 mm now versus 4 to 5 mm last month. No regional edema or mass effect. 2. Nearby 2 additional punctate enhancing bilateral inferior cerebellar metastases on all 3 postcontrast imaging sequences (series 20 images 35 and 39), and the lesion on image 35 in the right hemisphere appears increased from last month. 3. Posterior right hemisphere partially solid and nodular enhancing metastases on series 20 image 103 measuring 18 mm long axis appears stable. Regional T2 and FLAIR hyperintensity also stable and no significant regional mass effect. 4. Posterior right frontal operculum 13 mm enhancing nodular, partially solid metastasis appears stable on series 20 image 116. Mild regional T2 and FLAIR hyperintensity appears stable without mass effect. No convincing leptomeningeal enhancement. Small developmental venous anomaly (normal variant) suspected in the left occipital lobe on series 20 image 87 and stable from 2024. Borderline to mild asymmetric right hemisphere dural pachymeningeal thickening also noted (series 20 image 102), and appears new since 2024 but  not significantly changed from last month. No areas of pachymeningeal nodularity. No convincing leptomeningeal enhancement. ORBITS: No acute abnormality. SINUSES: No acute abnormality. BONES AND SOFT TISSUES: Progressive bone marrow signal heterogeneity since 2024 compatible with widespread osseous metastatic disease. No destructive osseous lesion identified. Numerous small nonspecific FLAIR hyperintense scalp nodules which are mostly new since 2024, not significantly changed from last month and nonspecific. Grossly negative visible spinal cord. IMPRESSION: 1. 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 No associated acute hemorrhage or mass effect. 2. Superimposed Five enhancing intracranial metastases, with slight interval progression in the right cerebellum since outside MRI last month. Subtle right hemisphere pachymeningeal thickening and enhancement also appears increased since 2024, with underlying diffuse osseous metastatic disease Electronically signed by: Helayne Hurst MD 11/29/2024 04:22 AM EST RP Workstation: [id number]               LOS: 2 days   Gracemarie Skeet  [facility]   Pager on [email or web address]. If 7PM-7AM, please contact night-coverage at [email or web address]     11/29/2024, 1:04 PM           "

## 2024-11-30 ENCOUNTER — Inpatient Hospital Stay

## 2024-11-30 DIAGNOSIS — J189 Pneumonia, unspecified organism: Secondary | ICD-10-CM | POA: Diagnosis not present

## 2024-11-30 DIAGNOSIS — A419 Sepsis, unspecified organism: Secondary | ICD-10-CM | POA: Diagnosis not present

## 2024-11-30 LAB — CBC
HCT: 26.8 % — ABNORMAL LOW (ref 36.0–46.0)
HCT: 24.3 % — ABNORMAL LOW (ref 36.0–46.0)
Hemoglobin: 8.9 g/dL — ABNORMAL LOW (ref 12.0–15.0)
Hemoglobin: 8 g/dL — ABNORMAL LOW (ref 12.0–15.0)
MCH: 24.2 pg — ABNORMAL LOW (ref 26.0–34.0)
MCH: 24.2 pg — ABNORMAL LOW (ref 26.0–34.0)
MCHC: 33.2 g/dL (ref 30.0–36.0)
MCHC: 32.9 g/dL (ref 30.0–36.0)
MCV: 72.8 fL — ABNORMAL LOW (ref 80.0–100.0)
MCV: 73.4 fL — ABNORMAL LOW (ref 80.0–100.0)
Platelets: 12 K/uL — CL (ref 150–400)
Platelets: 24 K/uL — CL (ref 150–400)
RBC: 3.68 MIL/uL — ABNORMAL LOW (ref 3.87–5.11)
RBC: 3.31 MIL/uL — ABNORMAL LOW (ref 3.87–5.11)
RDW: 22 % — ABNORMAL HIGH (ref 11.5–15.5)
RDW: 22 % — ABNORMAL HIGH (ref 11.5–15.5)
WBC: 2.7 K/uL — ABNORMAL LOW (ref 4.0–10.5)
WBC: 2.6 K/uL — ABNORMAL LOW (ref 4.0–10.5)
nRBC: 0 % (ref 0.0–0.2)
nRBC: 0 % (ref 0.0–0.2)

## 2024-11-30 LAB — CBC WITH DIFFERENTIAL/PLATELET
Abs Immature Granulocytes: 0.05 K/uL (ref 0.00–0.07)
Basophils Absolute: 0 K/uL (ref 0.0–0.1)
Basophils Relative: 1 %
Eosinophils Absolute: 0 K/uL (ref 0.0–0.5)
Eosinophils Relative: 1 %
HCT: 26 % — ABNORMAL LOW (ref 36.0–46.0)
Hemoglobin: 8.5 g/dL — ABNORMAL LOW (ref 12.0–15.0)
Immature Granulocytes: 2 %
Lymphocytes Relative: 13 %
Lymphs Abs: 0.4 K/uL — ABNORMAL LOW (ref 0.7–4.0)
MCH: 24 pg — ABNORMAL LOW (ref 26.0–34.0)
MCHC: 32.7 g/dL (ref 30.0–36.0)
MCV: 73.4 fL — ABNORMAL LOW (ref 80.0–100.0)
Monocytes Absolute: 0 K/uL — ABNORMAL LOW (ref 0.1–1.0)
Monocytes Relative: 0 %
Neutro Abs: 2.3 K/uL (ref 1.7–7.7)
Neutrophils Relative %: 83 %
Platelets: 10 K/uL — CL (ref 150–400)
RBC: 3.54 MIL/uL — ABNORMAL LOW (ref 3.87–5.11)
RDW: 21.5 % — ABNORMAL HIGH (ref 11.5–15.5)
WBC: 2.8 K/uL — ABNORMAL LOW (ref 4.0–10.5)
nRBC: 0 % (ref 0.0–0.2)

## 2024-11-30 LAB — BASIC METABOLIC PANEL WITH GFR
Anion gap: 15 (ref 5–15)
BUN: 6 mg/dL (ref 6–20)
CO2: 21 mmol/L — ABNORMAL LOW (ref 22–32)
Calcium: 7.9 mg/dL — ABNORMAL LOW (ref 8.9–10.3)
Chloride: 99 mmol/L (ref 98–111)
Creatinine, Ser: 0.57 mg/dL (ref 0.44–1.00)
GFR, Estimated: >60 mL/min
Glucose, Bld: 162 mg/dL — ABNORMAL HIGH (ref 70–99)
Potassium: 4.8 mmol/L (ref 3.5–5.1)
Sodium: 135 mmol/L (ref 135–145)

## 2024-11-30 LAB — COMPREHENSIVE METABOLIC PANEL WITH GFR
ALT: 362 U/L — ABNORMAL HIGH (ref 0–44)
AST: 486 U/L — ABNORMAL HIGH (ref 15–41)
Albumin: 2.6 g/dL — ABNORMAL LOW (ref 3.5–5.0)
Alkaline Phosphatase: 515 U/L — ABNORMAL HIGH (ref 38–126)
Anion gap: 13 (ref 5–15)
BUN: 6 mg/dL (ref 6–20)
CO2: 24 mmol/L (ref 22–32)
Calcium: 8.1 mg/dL — ABNORMAL LOW (ref 8.9–10.3)
Chloride: 99 mmol/L (ref 98–111)
Creatinine, Ser: 0.51 mg/dL (ref 0.44–1.00)
GFR, Estimated: >60 mL/min
Glucose, Bld: 107 mg/dL — ABNORMAL HIGH (ref 70–99)
Potassium: 4.3 mmol/L (ref 3.5–5.1)
Sodium: 136 mmol/L (ref 135–145)
Total Bilirubin: 1.2 mg/dL (ref 0.0–1.2)
Total Protein: 5.8 g/dL — ABNORMAL LOW (ref 6.5–8.1)

## 2024-11-30 LAB — VITAMIN B12: Vitamin B-12: >4000 pg/mL — ABNORMAL HIGH (ref 180–914)

## 2024-11-30 MED ORDER — AZITHROMYCIN 250 MG PO TABS: 500.0000 mg | ORAL_TABLET | Freq: Once | ORAL | Status: DC

## 2024-11-30 MED ORDER — SODIUM CHLORIDE 0.9% IV SOLUTION: Freq: Once | INTRAVENOUS | Status: AC

## 2024-11-30 MED ORDER — ALPRAZOLAM 0.25 MG PO TABS
0.2500 mg | ORAL_TABLET | Freq: Two times a day (BID) | ORAL | Status: DC | PRN
  Administered 2024-12-02: 0.25 mg via ORAL
  Filled 2024-11-30 (×2): qty 1

## 2024-11-30 MED ORDER — SODIUM CHLORIDE 0.9 % IV SOLN
100.0000 mg | Freq: Two times a day (BID) | INTRAVENOUS | Status: AC
  Administered 2024-11-30 (×2): 100 mg via INTRAVENOUS
  Filled 2024-11-30 (×2): qty 100

## 2024-11-30 MED ORDER — IOHEXOL 350 MG/ML SOLN
75.0000 mL | Freq: Once | INTRAVENOUS | Status: AC | PRN
  Administered 2024-11-30: 75 mL via INTRAVENOUS

## 2024-11-30 NOTE — Progress Notes (Signed)
 PHARMACIST - PHYSICIAN COMMUNICATION  CONCERNING: Antibiotic IV to Oral Route Change Policy  RECOMMENDATION: This patient is receiving Azithromycin  500 mg q24h by the intravenous route.  Based on criteria approved by the Pharmacy and Therapeutics Committee, the antibiotic(s) is/are being converted to the equivalent oral dose form(s).  DESCRIPTION: These criteria include: Patient being treated for a respiratory tract infection, urinary tract infection, cellulitis or clostridium difficile associated diarrhea if on metronidazole  The patient is not neutropenic and does not exhibit a GI malabsorption state The patient is eating (either orally or via tube) and/or has been taking other orally administered medications for a least 24 hours The patient is improving clinically and has a Tmax < 100.5  If you have questions about this conversion, please contact the Pharmacy Department  []   [phone] )  Tara Hanna [x]   [phone] )  Tara Hanna []   [phone] )  Tara Hanna []   [phone] )  Tara Hanna []   [phone] )  [facility]    Thank you for involving pharmacy in this patient's care.    Tara Hanna PGY-1 Pharmacy Resident  [facility]  11/30/2024 9:09 AM

## 2024-11-30 NOTE — Plan of Care (Addendum)
 CTA of head and neck: No large vessel occlusion no significant stenosis in the head or neck. Known metastatic breast cancer including brain and osseous metastases. Abnormal left orbital soft tissue is also suspicious for metastasis.  TTE with bubble study: Pending  Electronically signed: Dr. Brentin Shin

## 2024-11-30 NOTE — Progress Notes (Signed)
 " PROGRESS NOTE    Tara Hanna  FMW:[id number] DOB: 08-20-1976 DOA: 11/26/2024 PCP: System, Provider Not In  Subjective: No acute events overnight. Seen and examined at bedside. Reports feeling better with improvement in breathing. Denies chest pain or palpitations. Tolerating oral intake without n/v. Denies constipation.   Hospital Course:  49 y.o. female  with medical history of HTN, metastatic breast cancer with metastasis to liver, brain and bone on chemotherapy (last chemo about 4 days prior to admission).  Breast cancer was diagnosed in 2023 and she has recurrent and metastatic disease on palliative chemotherapy with doxorubicin  every 28 days.  Last chemo was on 11/22/2024.  She presented to the hospital with shortness of breath, abdominal and back pain. She was diagnosed with possible severe sepsis secondary to community-acquired pneumonia.    Assessment and Plan:  Severe sepsis  Community-acquired pneumonia:  Bcx 1/3 with NGTD, final read pending MRSA screen is negative S/p treatment with IV fluids. Continue IV ceftriaxone  Switch azithromycin  to doxycycline  (IV azithromycin  shortage).  Plan for 5 day total antibiotic course to end 11/30/24  Acute hypoxic respiratory failure:  Currently on 1 < 2 L oxygen via [place], not on oxygen at home Likely in the setting of acute CAP See management of PNA as elsewhere OOB to chair during daytime Incentive spirometry Wean oxygen as tolerated  Acute embolic stroke Metastatic brain disease  Noted on MRI brain 11/29/24 CTA head/neck 11/30/24 with no LVO or significant stenosis in head/neck Severe thrombocytopenia may make use of antiplatelets/anticoagulation a challenge Cont keppra  as elsewhere for seizure prophylaxis Will get TTE w/ bubble study SLP signed off PT/OT consulted and following Neurology following  Anemia of chronic disease Pancytopenia:  Possibly due to malignancy. Recent chemotherapy likely contributed to pancytopenia WBC  2.7 < 2.8 Hemoglobin 8.9 < 8.4  Platelets 12 < 26 Iron studies inconsistent with iron deficiency anemia.  Ferritin level greater than 7500, saturation ratio 49, iron level 107. Will give 1 unit platelets as per oncology recommendations Oncology following  Metastatic breast cancer  Metastatic disease to the liver, brain, bone:  Continue Keppra  for seizure prophylaxis. S/p 1 cycle of Doxil  on 11/22/2024 Case discussed with Dr. Rennie, oncologist.   Patient is interested in radiation therapy for brain metastasis  Evaluation by Dr. Lenn, radiation oncologist is pending    Sinus tachycardia:  Persistent in nature Likely from underlying acute illness.  Suspect anxiety could be contributing to this.  Chart review shows she was tachycardic with heart rate of 126 at her oncologist office on 11/22/2024. Monitor clinically    Lactic acidosis Persistent Likely in the setting of metastatic liver disease with impaired function/clearance S/p treatment with IV fluids. Will stop trending and monitor clinically    Elevated liver enzymes:  Likely due to metastatic liver disease. Monitor clinically   Anxiety At home on xanax  TID PRN Cont as BID PRN for now  Hyponatremia: Improved.   Hypertension: HCTZ on hold because BP is soft.  DVT prophylaxis: Place and maintain sequential compression device Start: 11/29/24 0851 SCDs Start: 11/26/24 1706  SCDs   Code Status: Full Code  Disposition Plan: TBD pending clinical course Reason for continuing need for hospitalization: IV antibiotics, TTE with bubble study pending, radiation oncology consult pending, oncology recommendations, wean oxygen as tolerated, PT/OT evaluations  Objective: Vitals:   11/30/24 1033 11/30/24 1038 11/30/24 1057 11/30/24 1100  BP:  (!) 134/90 (!) 121/91 (!) 121/91  Pulse:      Resp:  (!) 45  Temp: 99.6 F (37.6 C)  98.6 F (37 C)   TempSrc: Oral  Oral   SpO2:  96%  100%  Weight:      Height:         Intake/Output Summary (Last 24 hours) at 11/30/2024 1436 Last data filed at 11/30/2024 1101 Gross per 24 hour  Intake 973 ml  Output 800 ml  Net 173 ml   Filed Weights   11/28/24 0500 11/29/24 0500 11/30/24 0500  Weight: 101.1 kg 101.1 kg 101 kg    Examination:  Physical Exam Vitals and nursing note reviewed.  Constitutional:      General: She is not in acute distress.    Appearance: She is ill-appearing.     Comments: frail  HENT:     Head: Normocephalic and atraumatic.  Cardiovascular:     Rate and Rhythm: Normal rate and regular rhythm.     Pulses: Normal pulses.     Heart sounds: Normal heart sounds.  Pulmonary:     Effort: Pulmonary effort is normal.     Breath sounds: Normal breath sounds.  Abdominal:     General: Bowel sounds are normal.     Palpations: Abdomen is soft.  Musculoskeletal:     Right lower leg: No edema.     Left lower leg: No edema.  Neurological:     Mental Status: She is alert.     Data Reviewed: I have personally reviewed following labs and imaging studies  CBC: Recent Labs  Lab 11/26/24 1302 11/27/24 0349 11/28/24 0555 11/29/24 0559 11/30/24 0541 11/30/24 0834  WBC 6.0 5.5 3.5* 2.8* 2.7* 2.8*  NEUTROABS 5.0  --   --  2.3  --  2.3  HGB 11.2* 10.4* 9.3* 8.4* 8.9* 8.5*  HCT 34.8* 31.7* 27.8* 25.7* 26.8* 26.0*  MCV 74.2* 73.5* 73.5* 74.1* 72.8* 73.4*  PLT 87* 61* 43* 26* 12* 10*   Basic Metabolic Panel: Recent Labs  Lab 11/26/24 1302 11/27/24 0349 11/28/24 0555 11/29/24 0559 11/30/24 0541 11/30/24 0834  NA 138 133* 137 137 136 135  K 4.1 4.1 4.0 3.8 4.3 4.8  CL 97* 96* 100 101 99 99  CO2 24 21* 23 24 24  21*  GLUCOSE 103* 163* 152* 97 107* 162*  BUN 11 10 9 7 6 6   CREATININE 0.68 0.73 0.62 0.57 0.51 0.57  CALCIUM 9.5 8.3* 8.6* 8.3* 8.1* 7.9*  MG 1.8  --   --  1.8  --   --   PHOS  --   --   --  1.9*  --   --    GFR: Estimated Creatinine Clearance: 98.3 mL/min (by C-G formula based on SCr of 0.57 mg/dL). Liver  Function Tests: Recent Labs  Lab 11/26/24 1302 11/28/24 0555 11/29/24 0559 11/30/24 0541  AST 617* 558*  --  486*  ALT 503* 452*  --  362*  ALKPHOS 759* 531*  --  515*  BILITOT 1.0 1.0  --  1.2  PROT 7.0 5.9*  --  5.8*  ALBUMIN 3.4* 2.7* 2.6* 2.6*   No results for input(s): LIPASE, AMYLASE in the last 168 hours. No results for input(s): AMMONIA in the last 168 hours. Coagulation Profile: Recent Labs  Lab 11/26/24 1302  INR 1.0   Cardiac Enzymes: No results for input(s): CKTOTAL, CKMB, CKMBINDEX, TROPONINI in the last 168 hours. ProBNP, BNP (last 5 results) No results for input(s): PROBNP, BNP in the last 8760 hours. HbA1C: No results for input(s): HGBA1C in the last 72  hours. CBG: Recent Labs  Lab 11/28/24 1600 11/28/24 2014 11/29/24 0820 11/29/24 1135 11/29/24 1706  GLUCAP 145* 117* 108* 96 79   Lipid Profile: No results for input(s): CHOL, HDL, LDLCALC, TRIG, CHOLHDL, LDLDIRECT in the last 72 hours. Thyroid  Function Tests: No results for input(s): TSH, T4TOTAL, FREET4, T3FREE, THYROIDAB in the last 72 hours. Anemia Panel: Recent Labs    11/28/24 0555  FOLATE 9.3  FERRITIN >7,500*  TIBC 217*  IRON 107   Sepsis Labs: Recent Labs  Lab 11/26/24 2237 11/27/24 0349 11/28/24 0555 11/29/24 0100 11/29/24 0559  PROCALCITON 1.52  --   --   --   --   LATICACIDVEN 3.6* 3.5* 3.4* 2.9* 3.3*    Recent Results (from the past 240 hours)  Culture, blood (Routine x 2)     Status: None (Preliminary result)   Collection Time: 11/26/24  1:01 PM   Specimen: BLOOD  Result Value Ref Range Status   Specimen Description BLOOD RIGHT ANTECUBITAL  Final   Special Requests   Final    BOTTLES DRAWN AEROBIC AND ANAEROBIC Blood Culture results may not be optimal due to an inadequate volume of blood received in culture bottles   Culture   Final    NO GROWTH 4 DAYS Performed at [facility], [address] [place], [place]  [address]    Report Status PENDING  Incomplete  Resp panel by RT-PCR (RSV, Flu A&B, Covid) Anterior Nasal Swab     Status: None   Collection Time: 11/26/24  1:30 PM   Specimen: Anterior Nasal Swab  Result Value Ref Range Status   SARS Coronavirus 2 by RT PCR NEGATIVE NEGATIVE Final    Comment: (NOTE) SARS-CoV-2 target nucleic acids are NOT DETECTED.  The SARS-CoV-2 RNA is generally detectable in upper respiratory specimens during the acute phase of infection. The lowest concentration of SARS-CoV-2 viral copies this assay can detect is 138 copies/mL. A negative result does not preclude SARS-Cov-2 infection and should not be used as the sole basis for treatment or other patient management decisions. A negative result may occur with  improper specimen collection/handling, submission of specimen other than nasopharyngeal swab, presence of viral mutation(s) within the areas targeted by this assay, and inadequate number of viral copies(<138 copies/mL). A negative result must be combined with clinical observations, patient history, and epidemiological information. The expected result is Negative.  Fact Sheet for Patients:  bloggercourse.com  Fact Sheet for Healthcare Providers:  seriousbroker.it  This test is no t yet approved or cleared by the [place]  FDA and  has been authorized for detection and/or diagnosis of SARS-CoV-2 by FDA under an Emergency Use Authorization (EUA). This EUA will remain  in effect (meaning this test can be used) for the duration of the COVID-19 declaration under Section 564(b)(1) of the Act, 21 U.S.C.section 360bbb-3(b)(1), unless the authorization is terminated  or revoked sooner.       Influenza A by PCR NEGATIVE NEGATIVE Final   Influenza B by PCR NEGATIVE NEGATIVE Final    Comment: (NOTE) The Xpert Xpress SARS-CoV-2/FLU/RSV plus assay is intended as an aid in the diagnosis of influenza from  Nasopharyngeal swab specimens and should not be used as a sole basis for treatment. Nasal washings and aspirates are unacceptable for Xpert Xpress SARS-CoV-2/FLU/RSV testing.  Fact Sheet for Patients: bloggercourse.com  Fact Sheet for Healthcare Providers: seriousbroker.it  This test is not yet approved or cleared by the [place]  FDA and has been authorized for detection and/or diagnosis  of SARS-CoV-2 by FDA under an Emergency Use Authorization (EUA). This EUA will remain in effect (meaning this test can be used) for the duration of the COVID-19 declaration under Section 564(b)(1) of the Act, 21 U.S.C. section 360bbb-3(b)(1), unless the authorization is terminated or revoked.     Resp Syncytial Virus by PCR NEGATIVE NEGATIVE Final    Comment: (NOTE) Fact Sheet for Patients: bloggercourse.com  Fact Sheet for Healthcare Providers: seriousbroker.it  This test is not yet approved or cleared by the [place]  FDA and has been authorized for detection and/or diagnosis of SARS-CoV-2 by FDA under an Emergency Use Authorization (EUA). This EUA will remain in effect (meaning this test can be used) for the duration of the COVID-19 declaration under Section 564(b)(1) of the Act, 21 U.S.C. section 360bbb-3(b)(1), unless the authorization is terminated or revoked.  Performed at [facility], [address] [place], [place] [address]   Culture, blood (Routine x 2)     Status: None (Preliminary result)   Collection Time: 11/26/24  1:40 PM   Specimen: BLOOD  Result Value Ref Range Status   Specimen Description BLOOD BLOOD RIGHT HAND  Final   Special Requests   Final    BOTTLES DRAWN AEROBIC AND ANAEROBIC Blood Culture adequate volume   Culture   Final    NO GROWTH 4 DAYS Performed at [facility], [address] [place], [place] [address]    Report Status  PENDING  Incomplete  MRSA Next Gen by PCR, Nasal     Status: None   Collection Time: 11/29/24  8:31 PM   Specimen: Nasal Mucosa; Nasal Swab  Result Value Ref Range Status   MRSA by PCR Next Gen NOT DETECTED NOT DETECTED Final    Comment: (NOTE) The GeneXpert MRSA Assay (FDA approved for NASAL specimens only), is one component of a comprehensive MRSA colonization surveillance program. It is not intended to diagnose MRSA infection nor to guide or monitor treatment for MRSA infections. Test performance is not FDA approved in patients less than 59 years old. Performed at [facility], [address] [place], [place] [address]      Radiology Studies: CT ANGIO HEAD NECK W WO CM Result Date: 11/30/2024 EXAM: CT HEAD WITHOUT CTA HEAD AND NECK WITH 11/30/2024 10:04:02 AM TECHNIQUE: CTA of the head and neck was performed with the administration of 75 mL of iohexol  (OMNIPAQUE ) 350 MG/ML injection. Noncontrast CT of the head with reconstructed 2-D images are also provided for review. Multiplanar 2D and/or 3D reformatted images are provided for review. Automated exposure control, iterative reconstruction, and/or weight based adjustment of the mA/kV was utilized to reduce the radiation dose to as low as reasonably achievable. COMPARISON: MRI head 11/28/2024. CLINICAL HISTORY: Stroke/TIA, determine embolic source. Metastatic breast cancer. FINDINGS: CT HEAD: BRAIN AND VENTRICLES: The small acute right cerebellar and left cerebral infarcts on MRI are largely occult by CT. There is a 3 mm hypodensity in the left thalamus suggestive of a lacunar infarct without a corresponding abnormality on the recent MRI. Calcification and mild surrounding edema or gliosis are noted in the right frontal and right parietal lobes at the sites of known metastases. No acute intracranial hemorrhage, midline shift, hydrocephalus, or extra-axial fluid collection is identified. Cerebral volume is normal. ORBITS: Abnormal  extraconal soft tissue in the superolateral aspect of the left orbit anteriorly with extension into the preseptal soft tissues and with this abnormal soft tissue spanning approximately 3 cm in AP dimension. SINUSES AND MASTOIDS:  No acute abnormality. CTA NECK: AORTIC ARCH AND ARCH VESSELS: Normal variant aortic arch branching pattern with common origin of the brachiocephalic and left common carotid arteries. No dissection or arterial injury. No significant stenosis of the brachiocephalic or subclavian arteries. CERVICAL CAROTID ARTERIES: Partially retropharyngeal course of both proximal internal carotid arteries. No dissection, arterial injury, or hemodynamically significant stenosis by NASCET criteria. CERVICAL VERTEBRAL ARTERIES: Codominant vertebral arteries. No dissection, arterial injury, or significant stenosis. LUNGS AND MEDIASTINUM: Partially visualized mediastinal lymphadenopathy and left upper lobe consolidation, more fully evaluated on a recent CT chest from 11/26/2024. SOFT TISSUES: Partially visualized right jugular port a cath. BONES: Dental caries. Scattered small lucent spine lesions, for example involving the C4, C5, and C7 vertebral bodies potentially reflecting metastases. Skull lesions on MRI are not clearly visible by CT. CTA HEAD: ANTERIOR CIRCULATION: The intracranial internal carotid arteries are widely patent. ACAs and MCAs are patent without evidence of a proximal branch occlusion or significant proximal stenosis. No aneurysm. POSTERIOR CIRCULATION: The intracranial vertebral arteries are widely patent to the basilar. Patent PICA and SCA origins are visualized bilaterally. The basilar artery is widely patent. There are large posterior communicating arteries and hypoplastic P1 segments bilaterally. Both PCAs are patent without evidence of a significant proximal stenosis. No aneurysm. OTHER: No dural venous sinus thrombosis on this non-dedicated study. IMPRESSION: 1. Small acute right  cerebellar and left cerebral infarcts on MRI are largely occult by CT, however, there may be an interval acute lacunar infarct in the left thalamus. 2. No large vessel occlusion no significant stenosis in the head or neck. 3. Known metastatic breast cancer including brain and osseous metastases. Abnormal left orbital soft tissue is also suspicious for metastasis. Electronically signed by: Dasie Hamburg MD 11/30/2024 10:37 AM EST RP Workstation: [id number]    Scheduled Meds:  Chlorhexidine  Gluconate Cloth  6 each Topical Daily   feeding supplement  237 mL Oral BID BM   levETIRAcetam   500 mg Oral BID   sodium chloride  flush  10-40 mL Intracatheter Q12H   Continuous Infusions:  cefTRIAXone  (ROCEPHIN )  IV 2 g (11/29/24 2028)   doxycycline  (VIBRAMYCIN ) IV 100 mg (11/30/24 1148)     LOS: 3 days   Norval Bar, MD  [facility]  11/30/2024, 2:36 PM   "

## 2024-11-30 NOTE — Evaluation (Signed)
 Physical Therapy Evaluation Patient Details Name: Tara Hanna MRN: [id number] DOB: July 21, 1976 Today's Date: 11/30/2024  History of Present Illness  Tara Hanna is a 48yoF who [facility] on [facility]/26/24 for acute onset SOB, ABD pain, tachypnea. PMH: HTN, metastatic breast cancer with metastasis to liver, brain and bone currently on palliative chemotherapy. RVP (-) COVID, flu, RSV. Workup revealing of progression of metastatic disease process. MRI brain was obtained revealing an acute embolic strokes.  Clinical Impression  Pt sleeping on entry, awakens to voice- pt remains generally fatigued, but has been able to perform bed mobility and transfers without any need for physical assistance. Pt reports good tolerance of multiple toilet transfers today, supplement O2 still in use, however denies any acute focal imbalance or weakness- pt does not feel able to attempt any additional AMB overground at this time. Prior to admission, pt was still working, standing for hours, AMB without any device, not having any imbalance. WIll continue to follow and advance mobility as pt is able. Pt may have need for supplemental O2, RW or WC for longer format mobility at DC, will continue to consider as mobility progresses and DC plans become more evident.       If plan is discharge home, recommend the following: A little help with walking and/or transfers;Assist for transportation;Help with stairs or ramp for entrance   Can travel by private vehicle        Equipment Recommendations BSC/3in1;Other (comment) (additional DME pending as pt advances mobility next day)  Recommendations for Other Services       Functional Status Assessment Patient has had a recent decline in their functional status and demonstrates the ability to make significant improvements in function in a reasonable and predictable amount of time.     Precautions / Restrictions Precautions Precautions: Fall Recall of Precautions/Restrictions:  Intact Restrictions Weight Bearing Restrictions Per Provider Order: No      Mobility  Bed Mobility Overal bed mobility: Modified Independent Bed Mobility: Supine to Sit, Sit to Supine     Supine to sit: Modified independent (Device/Increase time), HOB elevated, Used rails Sit to supine: Modified independent (Device/Increase time), HOB elevated, Used rails        Transfers Overall transfer level: Needs assistance Equipment used: None Transfers: Sit to/from Stand, Bed to chair/wheelchair/BSC Sit to Stand: Supervision   Step pivot transfers: Supervision            Ambulation/Gait Ambulation/Gait assistance:  (pt feels to week to attempt at present)                Stairs            Wheelchair Mobility     Tilt Bed    Modified Rankin (Stroke Patients Only)       Balance                                             Pertinent Vitals/Pain Pain Assessment Pain Assessment: No/denies pain    Home Living Family/patient expects to be discharged to:: Private residence Living Arrangements: Spouse/significant other Available Help at Discharge: Family Type of Home: House Home Access: Stairs to enter   [employer] of Steps: 5-6 steps to enter one way 3 the other, both have 2 reachable railings.   Home Layout: One level Home Equipment: None      Prior Function Prior Level of  Function : Driving;Working/employed;Independent/Modified Independent             Mobility Comments: fully independent ADLs Comments: fully independent     Extremity/Trunk Assessment                Communication        Cognition Arousal: Alert Behavior During Therapy: WFL for tasks assessed/performed   PT - Cognitive impairments: No apparent impairments                                 Cueing       General Comments      Exercises     Assessment/Plan    PT Assessment Patient needs continued PT services  PT  Problem List Decreased mobility;Decreased activity tolerance       PT Treatment Interventions DME instruction;Gait training;Stair training;Functional mobility training;Therapeutic activities;Therapeutic exercise;Balance training;Neuromuscular re-education    PT Goals (Current goals can be found in the Care Plan section)  Acute Rehab PT Goals Patient Stated Goal: return to home, indpendent mobiilty, work PT Goal Formulation: With patient Time For Goal Achievement: 12/14/24 Potential to Achieve Goals: Fair    Frequency Min 2X/week     Co-evaluation               AM-PAC PT 6 Clicks Mobility  Outcome Measure Help needed turning from your back to your side while in a flat bed without using bedrails?: A Little Help needed moving from lying on your back to sitting on the side of a flat bed without using bedrails?: A Little Help needed moving to and from a bed to a chair (including a wheelchair)?: A Little Help needed standing up from a chair using your arms (e.g., wheelchair or bedside chair)?: A Little Help needed to walk in hospital room?: A Lot Help needed climbing 3-5 steps with a railing? : A Lot 6 Click Score: 16    End of Session Equipment Utilized During Treatment: Oxygen Activity Tolerance: Patient limited by fatigue;No increased pain Patient left: in bed;with family/visitor present;with call bell/phone within reach   PT Visit Diagnosis: Other abnormalities of gait and mobility (R26.89);Muscle weakness (generalized) (M62.81);Difficulty in walking, not elsewhere classified (R26.2)    Time: 8670-8658 PT Time Calculation (min) (ACUTE ONLY): 12 min   Charges:   PT Evaluation $PT Eval High Complexity: 1 High   PT General Charges $$ ACUTE PT VISIT: 1 Visit    2:39 PM, 11/30/2024 Peggye JAYSON Linear, PT, DPT Physical Therapist - [facility]  [phone] (ASCOM)    Jaecion Dempster C 11/30/2024, 2:35 PM

## 2024-11-30 NOTE — TOC Progression Note (Signed)
 Transition of Care [facility]) - Progression Note    Patient Details  Name: Tara Hanna MRN: [id number] Date of Birth: December 15, 1975  Transition of Care [facility]) CM/SW Contact  [name], LCSW Phone Number: 11/30/2024, 3:51 PM  Clinical Narrative:  Patient is agreeable to home health. Gave CMS scores for agencies that serve her zip code. Will follow up with offers once available.   Expected Discharge Plan: Home/Self Care Barriers to Discharge: Continued Medical Work up               Expected Discharge Plan and Services     Post Acute Care Choice: NA Living arrangements for the past 2 months: Single Family Home                                       Social Drivers of Health (SDOH) Interventions SDOH Screenings   Food Insecurity: No Food Insecurity (11/27/2024)  Housing: Unknown (11/27/2024)  Transportation Needs: No Transportation Needs (11/27/2024)  Utilities: Not At Risk (11/27/2024)  Recent Concern: Utilities - At Risk (11/08/2024)  Depression (PHQ2-9): Low Risk (11/08/2024)  Financial Resource Strain: High Risk (03/21/2022)   Received from [facility]  Tobacco Use: Medium Risk (11/26/2024)    Readmission Risk Interventions    11/29/2024   10:25 AM  Readmission Risk Prevention Plan  Transportation Screening Complete  PCP or Specialist Appt within 5-7 Days Complete  Medication Review (RN CM) Complete

## 2024-11-30 NOTE — Progress Notes (Signed)
 Tara Hanna   DOB:12-20-1975   FM#:[id number]    Subjective: Patient resting comfortably.  Patient is alone..  Notes to have further improvement of shortness of breath.  Denies any worsening neurologic deficits.  Denies any bleeding.  She is accompanied by daughter-in-law.  Objective:  Vitals:   11/30/24 1057 11/30/24 1100  BP: (!) 121/91 (!) 121/91  Pulse:    Resp:    Temp: 98.6 F (37 C)   SpO2:  100%     Intake/Output Summary (Last 24 hours) at 11/30/2024 1323 Last data filed at 11/30/2024 1101 Gross per 24 hour  Intake 973 ml  Output 1200 ml  Net -227 ml   Noted drooping of the left eyelid chronic  Physical Exam Vitals and nursing note reviewed.  HENT:     Head: Normocephalic and atraumatic.     Mouth/Throat:     Pharynx: Oropharynx is clear.  Eyes:     Extraocular Movements: Extraocular movements intact.     Pupils: Pupils are equal, round, and reactive to light.  Cardiovascular:     Rate and Rhythm: Regular rhythm. Tachycardia present.  Pulmonary:     Comments: Decreased breath sounds bilaterally.  Abdominal:     Palpations: Abdomen is soft.  Musculoskeletal:        General: Normal range of motion.     Cervical back: Normal range of motion.  Skin:    General: Skin is warm.  Neurological:     General: No focal deficit present.     Mental Status: She is alert and oriented to person, place, and time.  Psychiatric:        Behavior: Behavior normal.        Judgment: Judgment normal.      Labs:  Lab Results  Component Value Date   WBC 2.8 (L) 11/30/2024   HGB 8.5 (L) 11/30/2024   HCT 26.0 (L) 11/30/2024   MCV 73.4 (L) 11/30/2024   PLT 10 (LL) 11/30/2024   NEUTROABS 2.3 11/30/2024    Lab Results  Component Value Date   NA 135 11/30/2024   K 4.8 11/30/2024   CL 99 11/30/2024   CO2 21 (L) 11/30/2024    Studies:  CT ANGIO HEAD NECK W WO CM Result Date: 11/30/2024 EXAM: CT HEAD WITHOUT CTA HEAD AND NECK WITH 11/30/2024 10:04:02 AM TECHNIQUE: CTA of  the head and neck was performed with the administration of 75 mL of iohexol  (OMNIPAQUE ) 350 MG/ML injection. Noncontrast CT of the head with reconstructed 2-D images are also provided for review. Multiplanar 2D and/or 3D reformatted images are provided for review. Automated exposure control, iterative reconstruction, and/or weight based adjustment of the mA/kV was utilized to reduce the radiation dose to as low as reasonably achievable. COMPARISON: MRI head 11/28/2024. CLINICAL HISTORY: Stroke/TIA, determine embolic source. Metastatic breast cancer. FINDINGS: CT HEAD: BRAIN AND VENTRICLES: The small acute right cerebellar and left cerebral infarcts on MRI are largely occult by CT. There is a 3 mm hypodensity in the left thalamus suggestive of a lacunar infarct without a corresponding abnormality on the recent MRI. Calcification and mild surrounding edema or gliosis are noted in the right frontal and right parietal lobes at the sites of known metastases. No acute intracranial hemorrhage, midline shift, hydrocephalus, or extra-axial fluid collection is identified. Cerebral volume is normal. ORBITS: Abnormal extraconal soft tissue in the superolateral aspect of the left orbit anteriorly with extension into the preseptal soft tissues and with this abnormal soft tissue spanning approximately 3 cm  in AP dimension. SINUSES AND MASTOIDS: No acute abnormality. CTA NECK: AORTIC ARCH AND ARCH VESSELS: Normal variant aortic arch branching pattern with common origin of the brachiocephalic and left common carotid arteries. No dissection or arterial injury. No significant stenosis of the brachiocephalic or subclavian arteries. CERVICAL CAROTID ARTERIES: Partially retropharyngeal course of both proximal internal carotid arteries. No dissection, arterial injury, or hemodynamically significant stenosis by NASCET criteria. CERVICAL VERTEBRAL ARTERIES: Codominant vertebral arteries. No dissection, arterial injury, or significant  stenosis. LUNGS AND MEDIASTINUM: Partially visualized mediastinal lymphadenopathy and left upper lobe consolidation, more fully evaluated on a recent CT chest from 11/26/2024. SOFT TISSUES: Partially visualized right jugular port a cath. BONES: Dental caries. Scattered small lucent spine lesions, for example involving the C4, C5, and C7 vertebral bodies potentially reflecting metastases. Skull lesions on MRI are not clearly visible by CT. CTA HEAD: ANTERIOR CIRCULATION: The intracranial internal carotid arteries are widely patent. ACAs and MCAs are patent without evidence of a proximal branch occlusion or significant proximal stenosis. No aneurysm. POSTERIOR CIRCULATION: The intracranial vertebral arteries are widely patent to the basilar. Patent PICA and SCA origins are visualized bilaterally. The basilar artery is widely patent. There are large posterior communicating arteries and hypoplastic P1 segments bilaterally. Both PCAs are patent without evidence of a significant proximal stenosis. No aneurysm. OTHER: No dural venous sinus thrombosis on this non-dedicated study. IMPRESSION: 1. Small acute right cerebellar and left cerebral infarcts on MRI are largely occult by CT, however, there may be an interval acute lacunar infarct in the left thalamus. 2. No large vessel occlusion no significant stenosis in the head or neck. 3. Known metastatic breast cancer including brain and osseous metastases. Abnormal left orbital soft tissue is also suspicious for metastasis. Electronically signed by: Dasie Hamburg MD 11/30/2024 10:37 AM EST RP Workstation: [id number]   MR BRAIN W WO CONTRAST Result Date: 11/29/2024 EXAM: MRI BRAIN WITH AND WITHOUT CONTRAST 11/28/2024 02:20:35 PM TECHNIQUE: Multiplanar multisequence MRI of the head/brain was performed with and without the administration of intravenous contrast. CONTRAST: 10 mL gadobutrol  (GADAVIST ) 1 MMOL/ML injection 10 mL GADOBUTROL  1 MMOL/ML IV SOLN. COMPARISON: MR Head  11/02/2023, outside brain MRI 11/08/2024. CLINICAL HISTORY: 49 year old female with breast cancer, metastatic disease evaluation. Metastatic disease on CT chest, abdomen, and pelvis recently. Known brain metastases on MRI last year. FINDINGS: BRAIN AND VENTRICLES: No acute intracranial hemorrhage. No mass effect or midline shift. No hydrocephalus. The sella is unremarkable. Normal flow voids. The dural venous sinuses are enhancing and appear patent. nonenhancing scattered foci of restricted diffusion which are generally subcentimeter in both the right cerebellar hemisphere (series 10 images 13 through 16), in the medial left thalamus on series 10 image 31, and scattered in the posterior superior left frontal lobe on series 10 image 45 minimal associated T2 and FLAIR hyperintensity. On SWI mild chronic hemosiderin associated with the metastatic lesions is stable. No acute intracranial hemorrhage or mass effect. Multiple Brain Lesions: 1. Small nodular and solid enhancing 6 mm lesion right inferior cerebellum on series 20 image 37 appears slightly larger on both axial and coronal postcontrast images, up to 6 mm now versus 4 to 5 mm last month. No regional edema or mass effect. 2. Nearby 2 additional punctate enhancing bilateral inferior cerebellar metastases on all 3 postcontrast imaging sequences (series 20 images 35 and 39), and the lesion on image 35 in the right hemisphere appears increased from last month. 3. Posterior right hemisphere partially solid and nodular enhancing metastases on series 20  image 103 measuring 18 mm long axis appears stable. Regional T2 and FLAIR hyperintensity also stable and no significant regional mass effect. 4. Posterior right frontal operculum 13 mm enhancing nodular, partially solid metastasis appears stable on series 20 image 116. Mild regional T2 and FLAIR hyperintensity appears stable without mass effect. No convincing leptomeningeal enhancement. Small developmental venous anomaly  (normal variant) suspected in the left occipital lobe on series 20 image 87 and stable from 2024. Borderline to mild asymmetric right hemisphere dural pachymeningeal thickening also noted (series 20 image 102), and appears new since 2024 but not significantly changed from last month. No areas of pachymeningeal nodularity. No convincing leptomeningeal enhancement. ORBITS: No acute abnormality. SINUSES: No acute abnormality. BONES AND SOFT TISSUES: Progressive bone marrow signal heterogeneity since 2024 compatible with widespread osseous metastatic disease. No destructive osseous lesion identified. Numerous small nonspecific FLAIR hyperintense scalp nodules which are mostly new since 2024, not significantly changed from last month and nonspecific. Grossly negative visible spinal cord. IMPRESSION: 1. 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 No associated acute hemorrhage or mass effect. 2. Superimposed Five enhancing intracranial metastases, with slight interval progression in the right cerebellum since outside MRI last month. Subtle right hemisphere pachymeningeal thickening and enhancement also appears increased since 2024, with underlying diffuse osseous metastatic disease Electronically signed by: Helayne Hurst MD 11/29/2024 04:22 AM EST RP Workstation: [id number]    No problem-specific Assessment & Plan notes found for this encounter.  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  Too early to assess response to therapy.    # Worsening thrombocytopenia likely secondary to chemotherapy-today platelets are 12.  Status post monitor platelet transfusion..  # Metastatic  breast cancer to the brain-concern for possible progression on recent imaging in November 2025 at [facility].  Clinically asymptomatic at this time-November 29, 2023 brain MRI shows progressive brain lesions; and also evidence of strokes.  Discussed with Dr. Camelia regarding evaluation while patient is in the hospital.  Also awaiting neurology evaluation.  Given low platelets-antiplatelet therapy would be challenging.  # Tachycardia/subjective shortness of breath/lactic acidosis Suspect secondary to underlying malignancy- possible infection/possible infiltrate on the CT scan.  On antibiotics as per primary team.  # Elevated LFTs-secondary to underlying malignancy.  #   Cindy JONELLE Joe, MD 11/30/2024  1:23 PM

## 2024-11-30 NOTE — Evaluation (Signed)
 Occupational Therapy Evaluation Patient Details Name: Tara Hanna MRN: [id number] DOB: [birth year] Today's Date: 11/30/2024   History of Present Illness   Shivani Barrantes is a 48yoF who comes to [facility] on 11/26/24 for acute onset SOB, ABD pain, tachypnea. PMH: HTN, metastatic breast cancer with metastasis to liver, brain and bone currently on palliative chemotherapy. RVP (-) COVID, flu, RSV. Workup revealing of progression of metastatic disease process. MRI brain was obtained revealing an acute embolic strokes.     Clinical Impressions Patient presenting with decreased Ind in self care,balance, functional mobility, transfers, endurance, and safety awareness. Patient reports being Ind at baseline. She has been getting chemo recently and needing additional assistance from family with her husband, mother, and child assisting her as needed with ADLs and IADLs. Patient currently functioning at supervision-min guard for bed mobility and to stand and take side steps along EOB. Pt fatigues quickly. HR increases to 120's with standing. OT attempted to place pt on RA but pt unable to tolerate and drops to 87% before even attempting mobility and placed back on 2Ls this session. Pt returning back to supine at end of session.  Patient will benefit from acute OT to increase overall independence in the areas of ADLs, functional mobility, and safety awareness in order to safely discharge.     If plan is discharge home, recommend the following:   A little help with walking and/or transfers;A little help with bathing/dressing/bathroom;Assistance with cooking/housework;Assist for transportation;Help with stairs or ramp for entrance     Functional Status Assessment   Patient has had a recent decline in their functional status and demonstrates the ability to make significant improvements in function in a reasonable and predictable amount of time.     Equipment Recommendations   BSC/3in1       Precautions/Restrictions   Precautions Precautions: Fall Recall of Precautions/Restrictions: Intact     Mobility Bed Mobility Overal bed mobility: Modified Independent Bed Mobility: Supine to Sit, Sit to Supine     Supine to sit: Supervision Sit to supine: Supervision        Transfers Overall transfer level: Needs assistance   Transfers: Sit to/from Stand, Bed to chair/wheelchair/BSC Sit to Stand: Contact guard assist     Step pivot transfers: Contact guard assist                ADL either performed or assessed with clinical judgement   ADL Overall ADL's : Needs assistance/impaired                         Toilet Transfer: Minimal assistance Toilet Transfer Details (indicate cue type and reason): simulated                 Vision Patient Visual Report: No change from baseline              Pertinent Vitals/Pain Pain Assessment Pain Assessment: No/denies pain     Extremity/Trunk Assessment Upper Extremity Assessment Upper Extremity Assessment: Generalized weakness   Lower Extremity Assessment Lower Extremity Assessment: Generalized weakness       Communication Communication Communication: No apparent difficulties   Cognition Arousal: Alert Behavior During Therapy: WFL for tasks assessed/performed Cognition: No apparent impairments                               Following commands: Intact       Cueing  General Comments   Cueing  Techniques: Verbal cues              Home Living Family/patient expects to be discharged to:: Private residence Living Arrangements: Spouse/significant other Available Help at Discharge: Family Type of Home: House Home Access: Stairs to enter [employer] of Steps: 5-6 steps to enter one way 3 the other, both have 2 reachable railings.   Home Layout: One level     Bathroom Shower/Tub: Tub/shower unit         Home Equipment: Grab bars - tub/shower           Prior Functioning/Environment Prior Level of Function : Driving;Working/employed;Independent/Modified Independent             Mobility Comments: fully independent ADLs Comments: fully independent    OT Problem List: Decreased strength;Impaired balance (sitting and/or standing);Decreased safety awareness;Decreased activity tolerance   OT Treatment/Interventions: Self-care/ADL training;Therapeutic exercise;Patient/family education;Balance training;Energy conservation;Therapeutic activities      OT Goals(Current goals can be found in the care plan section)   Acute Rehab OT Goals Patient Stated Goal: to go home and get stronger OT Goal Formulation: With patient Time For Goal Achievement: 12/14/24 Potential to Achieve Goals: Fair ADL Goals Pt Will Perform Grooming: with modified independence;standing Pt Will Perform Lower Body Dressing: with modified independence;sit to/from stand Pt Will Transfer to Toilet: with modified independence;ambulating Pt Will Perform Toileting - Clothing Manipulation and hygiene: with modified independence;sit to/from stand Pt/caregiver will Perform Home Exercise Program: Both right and left upper extremity;Increased strength;With written HEP provided;With Supervision   OT Frequency:  Min 2X/week       AM-PAC OT 6 Clicks Daily Activity     Outcome Measure Help from another person eating meals?: None Help from another person taking care of personal grooming?: A Little Help from another person toileting, which includes using toliet, bedpan, or urinal?: A Little Help from another person bathing (including washing, rinsing, drying)?: A Little Help from another person to put on and taking off regular upper body clothing?: A Little Help from another person to put on and taking off regular lower body clothing?: A Little 6 Click Score: 19   End of Session Equipment Utilized During Treatment: Oxygen Nurse Communication: Mobility status  Activity  Tolerance: Patient limited by fatigue Patient left: in bed;with call bell/phone within reach;with bed alarm set  OT Visit Diagnosis: Unsteadiness on feet (R26.81);Muscle weakness (generalized) (M62.81)                Time: 8943-8879 OT Time Calculation (min): 24 min Charges:  OT General Charges $OT Visit: 1 Visit OT Evaluation $OT Eval Moderate Complexity: 1 Mod OT Treatments $Therapeutic Activity: 8-22 mins  Izetta Claude, MS, OTR/L , CBIS ascom [phone]  11/30/2024, 4:33 PM

## 2024-11-30 NOTE — Plan of Care (Signed)
" °  Problem: Education: Goal: Knowledge of General Education information will improve Description: Including pain rating scale, medication(s)/side effects and non-pharmacologic comfort measures Outcome: Progressing   Problem: Health Behavior/Discharge Planning: Goal: Ability to manage health-related needs will improve Outcome: Progressing   Problem: Clinical Measurements: Goal: Respiratory complications will improve Outcome: Progressing   Problem: Activity: Goal: Risk for activity intolerance will decrease Outcome: Progressing   Problem: Coping: Goal: Level of anxiety will decrease Outcome: Progressing   Problem: Elimination: Goal: Will not experience complications related to bowel motility Outcome: Progressing   Problem: Elimination: Goal: Will not experience complications related to urinary retention Outcome: Progressing   Problem: Pain Managment: Goal: General experience of comfort will improve and/or be controlled Outcome: Progressing   "

## 2024-11-30 NOTE — Plan of Care (Signed)
" °  Problem: Education: Goal: Knowledge of General Education information will improve Description: Including pain rating scale, medication(s)/side effects and non-pharmacologic comfort measures 11/30/2024 1649 by Jama Joane BIRCH, RN Outcome: Progressing 11/30/2024 1649 by Jama Joane BIRCH, RN Outcome: Progressing   Problem: Clinical Measurements: Goal: Ability to maintain clinical measurements within normal limits will improve 11/30/2024 1649 by Jama Joane BIRCH, RN Outcome: Progressing 11/30/2024 1649 by Jama Joane BIRCH, RN Outcome: Progressing Goal: Will remain free from infection 11/30/2024 1649 by Jama Joane BIRCH, RN Outcome: Progressing 11/30/2024 1649 by Jama Joane BIRCH, RN Outcome: Progressing Goal: Diagnostic test results will improve 11/30/2024 1649 by Jama Joane BIRCH, RN Outcome: Progressing 11/30/2024 1649 by Jama Joane BIRCH, RN Outcome: Progressing Goal: Respiratory complications will improve 11/30/2024 1649 by Jama Joane BIRCH, RN Outcome: Progressing 11/30/2024 1649 by Jama Joane BIRCH, RN Outcome: Progressing Goal: Cardiovascular complication will be avoided 11/30/2024 1649 by Jama Joane BIRCH, RN Outcome: Progressing 11/30/2024 1649 by Jama Joane BIRCH, RN Outcome: Progressing   Problem: Activity: Goal: Risk for activity intolerance will decrease 11/30/2024 1649 by Jama Joane BIRCH, RN Outcome: Progressing 11/30/2024 1649 by Jama Joane BIRCH, RN Outcome: Progressing   Problem: Nutrition: Goal: Adequate nutrition will be maintained 11/30/2024 1649 by Jama Joane BIRCH, RN Outcome: Progressing 11/30/2024 1649 by Jama Joane BIRCH, RN Outcome: Progressing   Problem: Coping: Goal: Level of anxiety will decrease 11/30/2024 1649 by Jama Joane BIRCH, RN Outcome: Progressing 11/30/2024 1649 by Jama Joane BIRCH, RN Outcome: Progressing   Problem: Elimination: Goal: Will not experience complications related to bowel motility 11/30/2024 1649 by Jama Joane BIRCH, RN Outcome: Progressing 11/30/2024 1649 by Jama Joane BIRCH, RN Outcome: Progressing Goal: Will not  experience complications related to urinary retention 11/30/2024 1649 by Jama Joane BIRCH, RN Outcome: Progressing 11/30/2024 1649 by Jama Joane BIRCH, RN Outcome: Progressing   Problem: Pain Managment: Goal: General experience of comfort will improve and/or be controlled 11/30/2024 1649 by Jama Joane BIRCH, RN Outcome: Progressing 11/30/2024 1649 by Jama Joane BIRCH, RN Outcome: Progressing   Problem: Safety: Goal: Ability to remain free from injury will improve 11/30/2024 1649 by Jama Joane BIRCH, RN Outcome: Progressing 11/30/2024 1649 by Jama Joane BIRCH, RN Outcome: Progressing   Problem: Skin Integrity: Goal: Risk for impaired skin integrity will decrease 11/30/2024 1649 by Jama Joane BIRCH, RN Outcome: Progressing 11/30/2024 1649 by Jama Joane BIRCH, RN Outcome: Progressing   "

## 2024-11-30 NOTE — Progress Notes (Signed)
 SLP Cancellation Note  Patient Details Name: Tara Hanna MRN: [id number] DOB: 05/15/1976   Cancelled treatment:       Reason Eval/Treat Not Completed: SLP screened, no needs identified, will sign off   [name] 11/30/2024, 2:37 PM

## 2024-12-01 ENCOUNTER — Inpatient Hospital Stay

## 2024-12-01 ENCOUNTER — Ambulatory Visit: Attending: Radiation Oncology | Admitting: Radiation Oncology

## 2024-12-01 ENCOUNTER — Ambulatory Visit

## 2024-12-01 DIAGNOSIS — J189 Pneumonia, unspecified organism: Secondary | ICD-10-CM | POA: Diagnosis not present

## 2024-12-01 DIAGNOSIS — Z515 Encounter for palliative care: Secondary | ICD-10-CM | POA: Diagnosis not present

## 2024-12-01 DIAGNOSIS — A419 Sepsis, unspecified organism: Secondary | ICD-10-CM | POA: Diagnosis not present

## 2024-12-01 LAB — BASIC METABOLIC PANEL WITH GFR
Anion gap: 17 — ABNORMAL HIGH (ref 5–15)
BUN: 6 mg/dL (ref 6–20)
CO2: 20 mmol/L — ABNORMAL LOW (ref 22–32)
Calcium: 8.1 mg/dL — ABNORMAL LOW (ref 8.9–10.3)
Chloride: 98 mmol/L (ref 98–111)
Creatinine, Ser: 0.63 mg/dL (ref 0.44–1.00)
GFR, Estimated: >60 mL/min
Glucose, Bld: 112 mg/dL — ABNORMAL HIGH (ref 70–99)
Potassium: 4.6 mmol/L (ref 3.5–5.1)
Sodium: 135 mmol/L (ref 135–145)

## 2024-12-01 LAB — PREPARE PLATELET PHERESIS: Unit division: 0

## 2024-12-01 LAB — BPAM PLATELET PHERESIS
Blood Product Expiration Date: 202601102359
ISSUE DATE / TIME: 202601071025
Unit Type and Rh: 5100

## 2024-12-01 LAB — CBC WITH DIFFERENTIAL/PLATELET
Abs Immature Granulocytes: 0.06 K/uL (ref 0.00–0.07)
Basophils Absolute: 0 K/uL (ref 0.0–0.1)
Basophils Relative: 1 %
Eosinophils Absolute: 0 K/uL (ref 0.0–0.5)
Eosinophils Relative: 0 %
HCT: 26.2 % — ABNORMAL LOW (ref 36.0–46.0)
Hemoglobin: 8.7 g/dL — ABNORMAL LOW (ref 12.0–15.0)
Immature Granulocytes: 2 %
Lymphocytes Relative: 13 %
Lymphs Abs: 0.3 K/uL — ABNORMAL LOW (ref 0.7–4.0)
MCH: 24.4 pg — ABNORMAL LOW (ref 26.0–34.0)
MCHC: 33.2 g/dL (ref 30.0–36.0)
MCV: 73.4 fL — ABNORMAL LOW (ref 80.0–100.0)
Monocytes Absolute: 0 K/uL — ABNORMAL LOW (ref 0.1–1.0)
Monocytes Relative: 1 %
Neutro Abs: 2.1 K/uL (ref 1.7–7.7)
Neutrophils Relative %: 83 %
Platelets: 12 K/uL — CL (ref 150–400)
RBC: 3.57 MIL/uL — ABNORMAL LOW (ref 3.87–5.11)
RDW: 21.9 % — ABNORMAL HIGH (ref 11.5–15.5)
WBC: 2.6 K/uL — ABNORMAL LOW (ref 4.0–10.5)
nRBC: 0 % (ref 0.0–0.2)

## 2024-12-01 LAB — LACTIC ACID, PLASMA: Lactic Acid, Venous: 6.4 mmol/L (ref 0.5–1.9)

## 2024-12-01 MED ORDER — SODIUM CHLORIDE 0.9% IV SOLUTION: Freq: Once | INTRAVENOUS | Status: DC

## 2024-12-01 MED ORDER — STERILE WATER FOR INJECTION IJ SOLN
INTRAMUSCULAR | Status: AC
  Filled 2024-12-01: qty 10

## 2024-12-01 MED ORDER — GUAIFENESIN-DM 100-10 MG/5ML PO SYRP
5.0000 mL | ORAL_SOLUTION | ORAL | Status: DC | PRN
  Administered 2024-12-01: 5 mL via ORAL
  Filled 2024-12-01: qty 10

## 2024-12-01 MED ORDER — ALTEPLASE 2 MG IJ SOLR
2.0000 mg | Freq: Once | INTRAMUSCULAR | Status: AC
  Administered 2024-12-01: 2 mg
  Filled 2024-12-01: qty 2

## 2024-12-01 NOTE — Progress Notes (Signed)
" °   12/01/24 2000  Assess: MEWS Score  BP 114/83  MAP (mmHg) 94  ECG Heart Rate (!) 135  Level of Consciousness Alert  Assess: MEWS Score  MEWS Temp 0  MEWS Systolic 0  MEWS Pulse 3  MEWS RR 0  MEWS LOC 0  MEWS Score 3  MEWS Score Color Yellow  Assess: SIRS CRITERIA  SIRS Temperature  0  SIRS Respirations  0  SIRS Pulse 1  SIRS WBC 0  SIRS Score Sum  1    "

## 2024-12-01 NOTE — Hospital Course (Signed)
 49 y.o. female with medical history of HTN, metastatic breast cancer with metastasis to liver, brain and bone on chemotherapy (last chemo about 4 days prior to admission). Breast cancer was diagnosed in 2023 and she has recurrent and metastatic disease on palliative chemotherapy with doxorubicin  every 28 days. Last chemo was on 11/22/2024. She presented to the hospital with shortness of breath, abdominal and back pain. She was diagnosed with possible severe sepsis secondary to community-acquired pneumonia.

## 2024-12-01 NOTE — Plan of Care (Signed)
   Problem: Education: Goal: Knowledge of General Education information will improve Description Including pain rating scale, medication(s)/side effects and non-pharmacologic comfort measures Outcome: Progressing

## 2024-12-01 NOTE — Consult Note (Signed)
 " NEW PATIENT EVALUATION  Name: Tara Hanna  MRN: [id number]  Date:   11/26/2024     DOB: [birth year]   This 49 y.o. 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  REFERRING PHYSICIAN: No ref. provider found  CHIEF COMPLAINT:  Chief Complaint  Patient presents with   Shortness of Breath   Abdominal Pain   Back Pain    DIAGNOSIS: The primary encounter diagnosis was Pneumonia of left upper lobe due to infectious organism. Diagnoses of Malignant neoplasm metastatic to breast [facility]) and Thrombocytopenia were also pertinent to this visit.   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  She has been treated with doxycycline  by medical oncology.  She was recently admitted with worsening thrombocytopenia.  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  Also has at least 5 enhancing intracranial metastasis with slight interval progression in the right cerebellum since outside MRI last month.  She does have underlying diffuse osseous metastatic disease.  Recent CT scan of chest abdomen pelvis shows left upper lobe consolidation extending to the left hilum possibly infiltrate or worsening neoplastic process.  She also has number increase in number and size of the hepatic metastasis and pulmonary nodules.  Also enlargement mediastinal hilar and upper abdominal lymph nodes.  She is seen today for consideration of palliative radiation therapy to her whole brain.  She is having some ptosis of her left eye secondary to most likely infarct.  She is aware of her surroundings.  She specifically denies  headaches.  PLANNED TREATMENT REGIMEN: Whole brain radiation  PAST MEDICAL HISTORY:  has a past medical history of Breast cancer (HCC), Fibroids, and Hypertension.    PAST SURGICAL HISTORY:  Past Surgical History:  Procedure Laterality Date   ABDOMINAL HYSTERECTOMY     BREAST LUMPECTOMY Left 2024    FAMILY HISTORY: family history includes Cancer - Prostate in her paternal uncle; Clotting disorder in her father; Diabetes in her maternal grandmother and mother; Heart failure in her maternal grandmother; Hypertension in her mother; Prostate cancer in her father.  SOCIAL HISTORY:  reports that she has quit smoking. Her smoking use included cigarettes. She has a 22 pack-year smoking history. She has never used smokeless tobacco. She reports that she does not currently use alcohol  after a past usage of about 2.0 standard drinks of alcohol  per week. She reports current drug use. Frequency: 2.00 times per week. Drug: Marijuana.  ALLERGIES: Patient has no known allergies.  MEDICATIONS:  Current Facility-Administered Medications  Medication Dose Route Frequency Provider Last Rate Last Admin   0.9 %  sodium chloride  infusion (Manually program via Guardrails IV Fluids)   Intravenous Once Tariq, Hassan, MD       acetaminophen  (TYLENOL ) tablet 650 mg  650 mg Oral Q6H PRN Khan, Ghalib, MD   650 mg at 12/01/24 0247   Or   acetaminophen  (TYLENOL ) suppository 650 mg  650 mg Rectal Q6H PRN Fernand Prost, MD       ALPRAZolam  (XANAX ) tablet 0.25 mg  0.25 mg Oral BID PRN Cosette Blackwater, MD       Chlorhexidine  Gluconate Cloth 2 % PADS  6 each  6 each Topical Daily Fernand Prost, MD   6 each at 12/01/24 1018   feeding supplement (ENSURE PLUS HIGH PROTEIN) liquid 237 mL  237 mL Oral BID BM Jens Durand, MD   237 mL at 12/01/24 1016   guaiFENesin -dextromethorphan  (ROBITUSSIN DM) 100-10 MG/5ML syrup 5 mL  5 mL Oral Q4H PRN Tariq, Hassan, MD   5 mL at 12/01/24 0425   HYDROmorphone  (DILAUDID ) injection 0.5 mg  0.5 mg  Intravenous Q3H PRN Khan, Ghalib, MD   0.5 mg at 11/27/24 0040   HYDROmorphone  (DILAUDID ) tablet 2 mg  2 mg Oral Q4H PRN Khan, Ghalib, MD   2 mg at 11/30/24 2138   ipratropium-albuterol  (DUONEB) 0.5-2.5 (3) MG/3ML nebulizer solution 3 mL  3 mL Nebulization Q6H PRN Fernand Prost, MD       levETIRAcetam  (KEPPRA ) tablet 500 mg  500 mg Oral BID Jens Durand, MD   500 mg at 12/01/24 1017   ondansetron  (ZOFRAN ) tablet 4 mg  4 mg Oral Q6H PRN Khan, Ghalib, MD   4 mg at 11/30/24 1249   Or   ondansetron  (ZOFRAN ) injection 4 mg  4 mg Intravenous Q6H PRN Khan, Ghalib, MD       Oral care mouth rinse  15 mL Mouth Rinse PRN Jens Durand, MD       senna-docusate (Senokot-S) tablet 1 tablet  1 tablet Oral QHS PRN Fernand Prost, MD       sodium chloride  flush (NS) 0.9 % injection 10-40 mL  10-40 mL Intracatheter Q12H Fernand Prost, MD   10 mL at 12/01/24 1018   sodium chloride  flush (NS) 0.9 % injection 10-40 mL  10-40 mL Intracatheter PRN Fernand Prost, MD       sterile water  (preservative free) injection            sterile water  (preservative free) injection             ECOG PERFORMANCE STATUS:  3 - Symptomatic, >50% confined to bed  REVIEW OF SYSTEMS: Patient denies any weight loss, fatigue, weakness, fever, chills or night sweats. Patient denies any loss of vision, blurred vision. Patient denies any ringing  of the ears or hearing loss. No irregular heartbeat. Patient denies heart murmur or history of fainting. Patient denies any chest pain or pain radiating to her upper extremities. Patient denies any shortness of breath, difficulty breathing at night, cough or hemoptysis. Patient denies any swelling in the lower legs. Patient denies any nausea vomiting, vomiting of blood, or coffee ground material in the vomitus. Patient denies any stomach pain. Patient states has had normal bowel movements no significant constipation or diarrhea. Patient denies any dysuria, hematuria or significant nocturia. Patient denies  any problems walking, swelling in the joints or loss of balance. Patient denies any skin changes, loss of hair or loss of weight. Patient denies any excessive worrying or anxiety or significant depression. Patient denies any problems with insomnia. Patient denies excessive thirst, polyuria, polydipsia. Patient denies any swollen glands, patient denies easy bruising or easy bleeding. Patient denies any recent infections, allergies or URI. Patient s visual fields have not changed significantly in recent time.   PHYSICAL EXAM: BP (!) 112/93 (BP Location: Left Arm)   Pulse (!) 122   Temp 99 F (37.2 C) (Oral)   Resp (!) 21   Ht 5' 4 (1.626 m)   Wt 220 lb 7.4 oz (100 kg)   SpO2 99%   BMI 37.84 kg/m  Frail-appearing female in NAD  seen in her hospital bed.  Motor and sensory levels are equal and symmetric in upper and lower extremities.  Proprioception is intact.  She has dropping of the left eyelid.  Crude visual fields appear within normal range.  Well-developed well-nourished patient in NAD. HEENT reveals PERLA, EOMI, discs not visualized.  Oral cavity is clear. No oral mucosal lesions are identified. Neck is clear without evidence of cervical or supraclavicular adenopathy. Lungs are clear to A&P. Cardiac examination is essentially unremarkable with regular rate and rhythm without murmur rub or thrill. Abdomen is benign with no organomegaly or masses noted. Motor sensory and DTR levels are equal and symmetric in the upper and lower extremities. Cranial nerves II through XII are grossly intact. Proprioception is intact. No peripheral adenopathy or edema is identified. No motor or sensory levels are noted. Crude visual fields are within normal range.  LABORATORY DATA: Labs reviewed    RADIOLOGY RESULTS: MRI CT scans reviewed compatible with above-stated findings   IMPRESSION: Progressive metastatic disease with progressive brain metastasis in 49 year old female with triple negative stage IV breast  cancer  PLAN: At this time I have recommended whole brain radiation therapy.  Will plan delivering 30 Gray in 10 fractions.  Her platelets are low although I believe once we start treatment planning those may rise now that the chemotherapy has been put on hold.  Risks and benefits of treatment including hair loss skin reaction fatigue were reviewed with the patient and her family.  They are anxious for me to commence with whole brain radiation therapy.  She is having a talk today with palliative care and Dr. B who I believe would be recommending hospice care.  Should there opinion regarding whole brain radiation therapy change certainly will adhere to the family's wishes.  I would like to take this opportunity to thank you for allowing me to participate in the care of your patient.SABRA Marcey Penton, MD         "

## 2024-12-01 NOTE — Plan of Care (Signed)

## 2024-12-01 NOTE — Progress Notes (Signed)
 Physical Therapy Treatment Patient Details Name: Tara Hanna MRN: [id number] DOB: Dec 02, 1975 Today's Date: 12/01/2024   History of Present Illness Tara Hanna is a 48yoF who comes to [facility] on 11/26/24 for acute onset SOB, ABD pain, tachypnea. PMH: HTN, metastatic breast cancer with metastasis to liver, brain and bone currently on palliative chemotherapy. RVP (-) COVID, flu, RSV. Workup revealing of progression of metastatic disease process. MRI brain was obtained revealing an acute embolic strokes.    PT Comments  Patient seen for PT session focused on sit to stand and stepping tolerance. Patient required min/CGA for few steps at bedside xs and sit to stands x 10 with rest breaks in between.. Tolerated session well with slight to moderate signs of exertion. Main limiting factors today were fatigue and slightly elevated HR at rest. Interventions aimed at improving activity tolerance. Patient shows good potential to make progress with continued acute level rehab. Continued skilled PT recommended to progress toward functional goals and support discharge readiness. Pt making good progress toward goals, will continue to follow POC. Discharge recommendation remains appropriate     If plan is discharge home, recommend the following: A little help with walking and/or transfers;Assist for transportation;Help with stairs or ramp for entrance   Can travel by private vehicle        Equipment Recommendations  BSC/3in1;Other (comment)    Recommendations for Other Services       Precautions / Restrictions Precautions Precautions: Fall Recall of Precautions/Restrictions: Intact Restrictions Weight Bearing Restrictions Per Provider Order: No     Mobility  Bed Mobility Overal bed mobility: Modified Independent Bed Mobility: Supine to Sit, Sit to Supine     Supine to sit: Supervision Sit to supine: Supervision        Transfers Overall transfer level: Needs assistance Equipment used:  None Transfers: Sit to/from Stand, Bed to chair/wheelchair/BSC Sit to Stand: Contact guard assist                Ambulation/Gait Ambulation/Gait assistance: Contact guard assist, Min assist Gait Distance (Feet): 6 Feet Assistive device: 1 person hand held assist Gait Pattern/deviations: Step-through pattern Gait velocity: decreased     General Gait Details: mild unsteadiness; would benefit from RW upon next visit HR elevated at baseline (sitting) 130/140s bpm   Stairs             Wheelchair Mobility     Tilt Bed    Modified Rankin (Stroke Patients Only)       Balance Overall balance assessment: Modified Independent                                          Communication Communication Communication: No apparent difficulties  Cognition Arousal: Alert Behavior During Therapy: WFL for tasks assessed/performed   PT - Cognitive impairments: No apparent impairments                         Following commands: Intact      Cueing Cueing Techniques: Verbal cues  Exercises      General Comments        Pertinent Vitals/Pain Pain Assessment Pain Assessment: No/denies pain    Home Living                          Prior Function  PT Goals (current goals can now be found in the care plan section) Acute Rehab PT Goals Patient Stated Goal: return to home, indpendent mobiilty, work PT Goal Formulation: With patient Time For Goal Achievement: 12/14/24 Potential to Achieve Goals: Fair Progress towards PT goals: Progressing toward goals    Frequency    Min 2X/week      PT Plan      Co-evaluation              AM-PAC PT 6 Clicks Mobility   Outcome Measure  Help needed turning from your back to your side while in a flat bed without using bedrails?: None Help needed moving from lying on your back to sitting on the side of a flat bed without using bedrails?: A Little Help needed moving to and  from a bed to a chair (including a wheelchair)?: A Little Help needed standing up from a chair using your arms (e.g., wheelchair or bedside chair)?: A Little Help needed to walk in hospital room?: A Lot Help needed climbing 3-5 steps with a railing? : A Lot 6 Click Score: 17    End of Session Equipment Utilized During Treatment: Oxygen Activity Tolerance: Patient limited by fatigue;No increased pain Patient left: in bed;with family/visitor present;with call bell/phone within reach Nurse Communication: Mobility status PT Visit Diagnosis: Other abnormalities of gait and mobility (R26.89);Muscle weakness (generalized) (M62.81);Difficulty in walking, not elsewhere classified (R26.2)     Time: 1343-1400 PT Time Calculation (min) (ACUTE ONLY): 17 min  Charges:    $Therapeutic Activity: 8-22 mins PT General Charges $$ ACUTE PT VISIT: 1 Visit                     Sherlean Lesches DPT, PT     Sherlean A Scotti Motter 12/01/2024, 2:12 PM

## 2024-12-01 NOTE — Progress Notes (Signed)
 " PROGRESS NOTE    Tara Hanna  FMW:[id number] DOB: [birth year] DOA: 11/26/2024 PCP: System, Provider Not In  Subjective: No acute events overnight. Seen and examined at bedside. Reports feeling better with improvement in breathing. Denies chest pain or palpitations. Tolerating oral intake without n/v. Denies constipation.    Hospital Course:  49 y.o. female with medical history of HTN, metastatic breast cancer with metastasis to liver, brain and bone on chemotherapy (last chemo about 4 days prior to admission). Breast cancer was diagnosed in 2023 and she has recurrent and metastatic disease on palliative chemotherapy with doxorubicin  every 28 days. Last chemo was on 11/22/2024. She presented to the hospital with shortness of breath, abdominal and back pain. She was diagnosed with possible severe sepsis secondary to community-acquired pneumonia.    Assessment and Plan:  Anemia of chronic disease Pancytopenia:  Possibly due to malignancy. Recent chemotherapy likely contributed to pancytopenia Recent PNA may have also contributed a little but less likely given no improvement in counts despite antibiotic course WBC 2.7 < 2.8 Hemoglobin 8.9 < 8.4  Platelets 12 < 24 Iron studies inconsistent with iron deficiency anemia.  Ferritin level greater than 7500, saturation ratio 49, iron level 107. Will give another 1 unit platelets  Oncology following  Metastatic breast cancer  Metastatic disease to the liver, brain, bone:  Continue Keppra  for seizure prophylaxis. S/p 1 cycle of Doxil  on 11/22/2024 Case discussed with Dr. Rennie, oncologist.   Patient is interested in radiation therapy for brain metastasis  Evaluation by Dr. Lenn, radiation oncologist is pending   Acute embolic stroke Metastatic brain disease  Noted on MRI brain 11/29/24 CTA head/neck 11/30/24 with no LVO or significant stenosis in head/neck Severe thrombocytopenia cotinues to make use of antiplatelets/anticoagulation  a challenge Cont keppra  as elsewhere for seizure prophylaxis TTE w/ bubble study pending Venous US  bilateral arms and legs pending  SLP signed off PT/OT consulted and following Neurology following  Severe sepsis  Community-acquired pneumonia:  Bcx 1/3 with NGTD, final read pending MRSA screen is negative S/p treatment with IV fluids. Status post 5-day course on antibiotics, finished on 11/30/24   Acute hypoxic respiratory failure:  Currently on 1 < 2 L oxygen via [place], not on oxygen at home Likely in the setting of acute CAP See management of PNA as elsewhere OOB to chair during daytime Incentive spirometry Wean oxygen as tolerated   Sinus tachycardia:  Persistent in nature Likely from underlying acute illness.  Suspect anxiety could be contributing to this.  Chart review shows she was tachycardic with heart rate of 126 at her oncologist office on 11/22/2024. Monitor clinically    Lactic acidosis Persistent Likely in the setting of metastatic liver disease with impaired function/clearance S/p treatment with IV fluids. Will stop trending and monitor clinically    Elevated liver enzymes:  Likely due to metastatic liver disease. Monitor clinically   Anxiety At home on xanax  TID PRN Cont as BID PRN for now   Hyponatremia: Improved.   Hypertension: HCTZ on hold because BP is soft.  Advance care planning: Discussed with patient on initial encounter, then with patient and family during a second encounter about poor prognosis given persistent thrombocytopenia requiring daily transfusions with inability to treat acute stroke and underlying metastatic breast cancer. Patient/family understand all the information but I am unable to ascertain if they are able to internalize the severity of the acute illnessess and associated poor prognosis. I have updated palliative care team also to revisit the patient/family  for ongoing goals of care discussions. Patient/family want to speak with  oncology also for the plan for her underlying malignancy. I spent a total of 45 minutes discussing goals of care with patient and family today.  DVT prophylaxis: Place and maintain sequential compression device Start: 11/29/24 0851 SCDs Start: 11/26/24 1706  SCDs   Code Status: Full Code Family Communication: updated husband, daughter, son and aunt at bedside Disposition Plan: TBD Reason for continuing need for hospitalization: severity of illness  Objective: Vitals:   12/01/24 0243 12/01/24 0342 12/01/24 0400 12/01/24 0500  BP:   114/85   Pulse:   (!) 134   Resp:   (!) 22   Temp: (!) 101.4 F (38.6 C) 98 F (36.7 C) 97.7 F (36.5 C)   TempSrc: Oral  Oral   SpO2:   95%   Weight:    100 kg  Height:        Intake/Output Summary (Last 24 hours) at 12/01/2024 1251 Last data filed at 12/01/2024 0754 Gross per 24 hour  Intake 740.07 ml  Output --  Net 740.07 ml   Filed Weights   11/29/24 0500 11/30/24 0500 12/01/24 0500  Weight: 101.1 kg 101 kg 100 kg    Examination:  Physical Exam Vitals and nursing note reviewed.  Constitutional:      General: She is not in acute distress.    Appearance: She is ill-appearing.  HENT:     Head: Normocephalic and atraumatic.  Cardiovascular:     Rate and Rhythm: Normal rate and regular rhythm.     Pulses: Normal pulses.     Heart sounds: Normal heart sounds.  Pulmonary:     Effort: Pulmonary effort is normal.     Breath sounds: Normal breath sounds.  Abdominal:     General: Bowel sounds are normal.     Palpations: Abdomen is soft.  Neurological:     Mental Status: She is alert.     Data Reviewed: I have personally reviewed following labs and imaging studies  CBC: Recent Labs  Lab 11/26/24 1302 11/27/24 0349 11/29/24 0559 11/30/24 0541 11/30/24 0834 11/30/24 1617 12/01/24 0412  WBC 6.0   < > 2.8* 2.7* 2.8* 2.6* 2.6*  NEUTROABS 5.0  --  2.3  --  2.3  --  2.1  HGB 11.2*   < > 8.4* 8.9* 8.5* 8.0* 8.7*  HCT 34.8*   < >  25.7* 26.8* 26.0* 24.3* 26.2*  MCV 74.2*   < > 74.1* 72.8* 73.4* 73.4* 73.4*  PLT 87*   < > 26* 12* 10* 24* 12*   < > = values in this interval not displayed.   Basic Metabolic Panel: Recent Labs  Lab 11/26/24 1302 11/27/24 0349 11/28/24 0555 11/29/24 0559 11/30/24 0541 11/30/24 0834 12/01/24 0412  NA 138   < > 137 137 136 135 135  K 4.1   < > 4.0 3.8 4.3 4.8 4.6  CL 97*   < > 100 101 99 99 98  CO2 24   < > 23 24 24  21* 20*  GLUCOSE 103*   < > 152* 97 107* 162* 112*  BUN 11   < > 9 7 6 6 6   CREATININE 0.68   < > 0.62 0.57 0.51 0.57 0.63  CALCIUM 9.5   < > 8.6* 8.3* 8.1* 7.9* 8.1*  MG 1.8  --   --  1.8  --   --   --   PHOS  --   --   --  1.9*  --   --   --    < > = values in this interval not displayed.   GFR: Estimated Creatinine Clearance: 97.8 mL/min (by C-G formula based on SCr of 0.63 mg/dL). Liver Function Tests: Recent Labs  Lab 11/26/24 1302 11/28/24 0555 11/29/24 0559 11/30/24 0541  AST 617* 558*  --  486*  ALT 503* 452*  --  362*  ALKPHOS 759* 531*  --  515*  BILITOT 1.0 1.0  --  1.2  PROT 7.0 5.9*  --  5.8*  ALBUMIN 3.4* 2.7* 2.6* 2.6*   No results for input(s): LIPASE, AMYLASE in the last 168 hours. No results for input(s): AMMONIA in the last 168 hours. Coagulation Profile: Recent Labs  Lab 11/26/24 1302  INR 1.0   Cardiac Enzymes: No results for input(s): CKTOTAL, CKMB, CKMBINDEX, TROPONINI in the last 168 hours. ProBNP, BNP (last 5 results) No results for input(s): PROBNP, BNP in the last 8760 hours. HbA1C: No results for input(s): HGBA1C in the last 72 hours. CBG: Recent Labs  Lab 11/28/24 1600 11/28/24 2014 11/29/24 0820 11/29/24 1135 11/29/24 1706  GLUCAP 145* 117* 108* 96 79   Lipid Profile: No results for input(s): CHOL, HDL, LDLCALC, TRIG, CHOLHDL, LDLDIRECT in the last 72 hours. Thyroid  Function Tests: No results for input(s): TSH, T4TOTAL, FREET4, T3FREE, THYROIDAB in the last 72  hours. Anemia Panel: Recent Labs    11/30/24 0541  VITAMINB12 >4,000*   Sepsis Labs: Recent Labs  Lab 11/26/24 2237 11/27/24 0349 11/28/24 0555 11/29/24 0100 11/29/24 0559 12/01/24 0412  PROCALCITON 1.52  --   --   --   --   --   LATICACIDVEN 3.6*   < > 3.4* 2.9* 3.3* 6.4*   < > = values in this interval not displayed.    Recent Results (from the past 240 hours)  Culture, blood (Routine x 2)     Status: None (Preliminary result)   Collection Time: 11/26/24  1:01 PM   Specimen: BLOOD  Result Value Ref Range Status   Specimen Description BLOOD RIGHT ANTECUBITAL  Final   Special Requests   Final    BOTTLES DRAWN AEROBIC AND ANAEROBIC Blood Culture results may not be optimal due to an inadequate volume of blood received in culture bottles   Culture   Final    NO GROWTH 4 DAYS Performed at [facility], [address], [place], [place] [address]    Report Status PENDING  Incomplete  Resp panel by RT-PCR (RSV, Flu A&B, Covid) Anterior Nasal Swab     Status: None   Collection Time: 11/26/24  1:30 PM   Specimen: Anterior Nasal Swab  Result Value Ref Range Status   SARS Coronavirus 2 by RT PCR NEGATIVE NEGATIVE Final    Comment: (NOTE) SARS-CoV-2 target nucleic acids are NOT DETECTED.  The SARS-CoV-2 RNA is generally detectable in upper respiratory specimens during the acute phase of infection. The lowest concentration of SARS-CoV-2 viral copies this assay can detect is 138 copies/mL. A negative result does not preclude SARS-Cov-2 infection and should not be used as the sole basis for treatment or other patient management decisions. A negative result may occur with  improper specimen collection/handling, submission of specimen other than nasopharyngeal swab, presence of viral mutation(s) within the areas targeted by this assay, and inadequate number of viral copies(<138 copies/mL). A negative result must be combined with clinical observations, patient history, and  epidemiological information. The expected result is Negative.  Fact Sheet for  Patients:  bloggercourse.com  Fact Sheet for Healthcare Providers:  seriousbroker.it  This test is no t yet approved or cleared by the [place]  FDA and  has been authorized for detection and/or diagnosis of SARS-CoV-2 by FDA under an Emergency Use Authorization (EUA). This EUA will remain  in effect (meaning this test can be used) for the duration of the COVID-19 declaration under Section 564(b)(1) of the Act, 21 U.S.C.section 360bbb-3(b)(1), unless the authorization is terminated  or revoked sooner.       Influenza A by PCR NEGATIVE NEGATIVE Final   Influenza B by PCR NEGATIVE NEGATIVE Final    Comment: (NOTE) The Xpert Xpress SARS-CoV-2/FLU/RSV plus assay is intended as an aid in the diagnosis of influenza from Nasopharyngeal swab specimens and should not be used as a sole basis for treatment. Nasal washings and aspirates are unacceptable for Xpert Xpress SARS-CoV-2/FLU/RSV testing.  Fact Sheet for Patients: bloggercourse.com  Fact Sheet for Healthcare Providers: seriousbroker.it  This test is not yet approved or cleared by the [place]  FDA and has been authorized for detection and/or diagnosis of SARS-CoV-2 by FDA under an Emergency Use Authorization (EUA). This EUA will remain in effect (meaning this test can be used) for the duration of the COVID-19 declaration under Section 564(b)(1) of the Act, 21 U.S.C. section 360bbb-3(b)(1), unless the authorization is terminated or revoked.     Resp Syncytial Virus by PCR NEGATIVE NEGATIVE Final    Comment: (NOTE) Fact Sheet for Patients: bloggercourse.com  Fact Sheet for Healthcare Providers: seriousbroker.it  This test is not yet approved or cleared by the [place]  FDA and has been  authorized for detection and/or diagnosis of SARS-CoV-2 by FDA under an Emergency Use Authorization (EUA). This EUA will remain in effect (meaning this test can be used) for the duration of the COVID-19 declaration under Section 564(b)(1) of the Act, 21 U.S.C. section 360bbb-3(b)(1), unless the authorization is terminated or revoked.  Performed at [facility], [address] [place], [place] [address]   Culture, blood (Routine x 2)     Status: None (Preliminary result)   Collection Time: 11/26/24  1:40 PM   Specimen: BLOOD  Result Value Ref Range Status   Specimen Description BLOOD BLOOD RIGHT HAND  Final   Special Requests   Final    BOTTLES DRAWN AEROBIC AND ANAEROBIC Blood Culture adequate volume   Culture   Final    NO GROWTH 4 DAYS Performed at [facility], [address], [place], [place] [address]    Report Status PENDING  Incomplete  MRSA Next Gen by PCR, Nasal     Status: None   Collection Time: 11/29/24  8:31 PM   Specimen: Nasal Mucosa; Nasal Swab  Result Value Ref Range Status   MRSA by PCR Next Gen NOT DETECTED NOT DETECTED Final    Comment: (NOTE) The GeneXpert MRSA Assay (FDA approved for NASAL specimens only), is one component of a comprehensive MRSA colonization surveillance program. It is not intended to diagnose MRSA infection nor to guide or monitor treatment for MRSA infections. Test performance is not FDA approved in patients less than 40 years old. Performed at [facility], [address] [place], [place] [address]      Radiology Studies: US  Venous Img Upper Bilat (DVT) Result Date: 12/01/2024 CLINICAL DATA:  Shortness of breath.  Metastatic breast cancer. EXAM: BILATERAL UPPER EXTREMITY VENOUS DOPPLER ULTRASOUND TECHNIQUE: Gray-scale sonography with graded compression, as well as color Doppler and duplex ultrasound were  performed to evaluate the bilateral upper extremity deep venous systems from the level of the  subclavian vein and including the jugular, axillary, basilic, radial, ulnar and upper cephalic vein. Spectral Doppler was utilized to evaluate flow at rest and with distal augmentation maneuvers. COMPARISON:  None Available. FINDINGS: RIGHT UPPER EXTREMITY Internal Jugular Vein: No evidence of thrombus. Normal compressibility, respiratory phasicity and color Doppler flow. Subclavian Vein: No evidence of thrombus. Normal color Doppler flow and phasicity. Axillary Vein: No evidence of thrombus. Normal compressibility, respiratory phasicity and response to augmentation. Cephalic Vein: Not visualized. Basilic Vein: No evidence of thrombus. Normal compressibility, respiratory phasicity and response to augmentation. Brachial Veins: No evidence of thrombus. Normal compressibility, respiratory phasicity and response to augmentation. Radial Veins: No evidence of thrombus. Normal compressibility and color Doppler flow. Ulnar Veins: No evidence of thrombus. Normal compressibility and color Doppler flow. Other Findings:  None. LEFT UPPER EXTREMITY Internal Jugular Vein: No evidence of thrombus. Normal compressibility, respiratory phasicity and color Doppler flow. Subclavian Vein: No evidence of thrombus. Normal color Doppler flow and phasicity. Axillary Vein: No evidence of thrombus. Normal compressibility, respiratory phasicity and response to augmentation. Cephalic Vein: No evidence of thrombus. Normal compressibility, respiratory phasicity and response to augmentation. Basilic Vein: No evidence of thrombus. Normal compressibility, respiratory phasicity and response to augmentation. Brachial Veins: No evidence of thrombus. Normal compressibility, respiratory phasicity and response to augmentation. Radial Veins: No evidence of thrombus. Normal compressibility and color Doppler flow. Ulnar Veins: No evidence of thrombus. Normal compressibility and color Doppler flow. Other Findings:  None. IMPRESSION: No evidence of DVT within  either upper extremity. Electronically Signed   By: Juliene Balder M.D.   On: 12/01/2024 09:52   US  Venous Img Lower Bilateral (DVT) Result Date: 12/01/2024 CLINICAL DATA:  Evaluate for deep venous thrombosis. Metastatic breast cancer. Acute hypoxic respiratory failure. EXAM: BILATERAL LOWER EXTREMITY VENOUS DOPPLER ULTRASOUND TECHNIQUE: Gray-scale sonography with graded compression, as well as color Doppler and duplex ultrasound were performed to evaluate the lower extremity deep venous systems from the level of the common femoral vein and including the common femoral, femoral, profunda femoral, popliteal and calf veins including the posterior tibial, peroneal and gastrocnemius veins when visible. Spectral Doppler was utilized to evaluate flow at rest and with distal augmentation maneuvers in the common femoral, femoral and popliteal veins. COMPARISON:  None Available. FINDINGS: RIGHT LOWER EXTREMITY Common Femoral Vein: No evidence of thrombus. Normal compressibility, respiratory phasicity and response to augmentation. Saphenofemoral Junction: No evidence of thrombus. Normal compressibility and flow on color Doppler imaging. Profunda Femoral Vein: No evidence of thrombus. Normal compressibility and flow on color Doppler imaging. Femoral Vein: No evidence of thrombus. Normal compressibility, respiratory phasicity and response to augmentation. Popliteal Vein: No evidence of thrombus. Normal compressibility, respiratory phasicity and response to augmentation. Calf Veins: No evidence of thrombus. Normal compressibility and flow on color Doppler imaging. Other Findings:  None. LEFT LOWER EXTREMITY Common Femoral Vein: No evidence of thrombus. Normal compressibility, respiratory phasicity and response to augmentation. Saphenofemoral Junction: No evidence of thrombus. Normal compressibility and flow on color Doppler imaging. Profunda Femoral Vein: No evidence of thrombus. Normal compressibility and flow on color Doppler  imaging. Femoral Vein: No evidence of thrombus. Normal compressibility, respiratory phasicity and response to augmentation. Popliteal Vein: No evidence of thrombus. Normal compressibility, respiratory phasicity and response to augmentation. Calf Veins: No evidence of thrombus. Normal compressibility and flow on color Doppler imaging. Other Findings:  None. IMPRESSION: No evidence of deep venous thrombosis in  either lower extremity. Electronically Signed   By: Juliene Balder M.D.   On: 12/01/2024 09:47   CT ANGIO HEAD NECK W WO CM Result Date: 11/30/2024 EXAM: CT HEAD WITHOUT CTA HEAD AND NECK WITH 11/30/2024 10:04:02 AM TECHNIQUE: CTA of the head and neck was performed with the administration of 75 mL of iohexol  (OMNIPAQUE ) 350 MG/ML injection. Noncontrast CT of the head with reconstructed 2-D images are also provided for review. Multiplanar 2D and/or 3D reformatted images are provided for review. Automated exposure control, iterative reconstruction, and/or weight based adjustment of the mA/kV was utilized to reduce the radiation dose to as low as reasonably achievable. COMPARISON: MRI head 11/28/2024. CLINICAL HISTORY: Stroke/TIA, determine embolic source. Metastatic breast cancer. FINDINGS: CT HEAD: BRAIN AND VENTRICLES: The small acute right cerebellar and left cerebral infarcts on MRI are largely occult by CT. There is a 3 mm hypodensity in the left thalamus suggestive of a lacunar infarct without a corresponding abnormality on the recent MRI. Calcification and mild surrounding edema or gliosis are noted in the right frontal and right parietal lobes at the sites of known metastases. No acute intracranial hemorrhage, midline shift, hydrocephalus, or extra-axial fluid collection is identified. Cerebral volume is normal. ORBITS: Abnormal extraconal soft tissue in the superolateral aspect of the left orbit anteriorly with extension into the preseptal soft tissues and with this abnormal soft tissue spanning  approximately 3 cm in AP dimension. SINUSES AND MASTOIDS: No acute abnormality. CTA NECK: AORTIC ARCH AND ARCH VESSELS: Normal variant aortic arch branching pattern with common origin of the brachiocephalic and left common carotid arteries. No dissection or arterial injury. No significant stenosis of the brachiocephalic or subclavian arteries. CERVICAL CAROTID ARTERIES: Partially retropharyngeal course of both proximal internal carotid arteries. No dissection, arterial injury, or hemodynamically significant stenosis by NASCET criteria. CERVICAL VERTEBRAL ARTERIES: Codominant vertebral arteries. No dissection, arterial injury, or significant stenosis. LUNGS AND MEDIASTINUM: Partially visualized mediastinal lymphadenopathy and left upper lobe consolidation, more fully evaluated on a recent CT chest from 11/26/2024. SOFT TISSUES: Partially visualized right jugular port a cath. BONES: Dental caries. Scattered small lucent spine lesions, for example involving the C4, C5, and C7 vertebral bodies potentially reflecting metastases. Skull lesions on MRI are not clearly visible by CT. CTA HEAD: ANTERIOR CIRCULATION: The intracranial internal carotid arteries are widely patent. ACAs and MCAs are patent without evidence of a proximal branch occlusion or significant proximal stenosis. No aneurysm. POSTERIOR CIRCULATION: The intracranial vertebral arteries are widely patent to the basilar. Patent PICA and SCA origins are visualized bilaterally. The basilar artery is widely patent. There are large posterior communicating arteries and hypoplastic P1 segments bilaterally. Both PCAs are patent without evidence of a significant proximal stenosis. No aneurysm. OTHER: No dural venous sinus thrombosis on this non-dedicated study. IMPRESSION: 1. Small acute right cerebellar and left cerebral infarcts on MRI are largely occult by CT, however, there may be an interval acute lacunar infarct in the left thalamus. 2. No large vessel occlusion  no significant stenosis in the head or neck. 3. Known metastatic breast cancer including brain and osseous metastases. Abnormal left orbital soft tissue is also suspicious for metastasis. Electronically signed by: Dasie Hamburg MD 11/30/2024 10:37 AM EST RP Workstation: [id number]    Scheduled Meds:  sodium chloride    Intravenous Once   Chlorhexidine  Gluconate Cloth  6 each Topical Daily   feeding supplement  237 mL Oral BID BM   levETIRAcetam   500 mg Oral BID   sodium chloride  flush  10-40  mL Intracatheter Q12H   sterile water  (preservative free)       sterile water  (preservative free)       Continuous Infusions:   LOS: 4 days   Norval Bar, MD  [facility]  12/01/2024, 12:51 PM   "

## 2024-12-01 NOTE — Progress Notes (Addendum)
 Pt HR is sustaining at 131 RR 22 and going back and forth between yellow and red mews. Pt has no complaints at this time. NP Donati-Garmon made aware.  Update 0525: No new order place.

## 2024-12-01 NOTE — Progress Notes (Signed)
" °   12/01/24 1939  Assess: MEWS Score  Temp 99.9 F (37.7 C)  BP 105/87  MAP (mmHg) 94  Resp 19  Level of Consciousness Alert  SpO2 99 %  O2 Device Nasal Cannula  Assess: MEWS Score  MEWS Temp 0  MEWS Systolic 0  MEWS Pulse 2  MEWS RR 0  MEWS LOC 0  MEWS Score 2  MEWS Score Color Yellow  Assess: if the MEWS score is Yellow or Red  Were vital signs accurate and taken at a resting state? Yes  MEWS guidelines implemented  No, previously yellow, continue vital signs every 4 hours  Notify: Charge Nurse/RN  Name of Charge Nurse/RN Occupational Psychologist  Assess: SIRS CRITERIA  SIRS Temperature  0  SIRS Respirations  0  SIRS Pulse 1  SIRS WBC 0  SIRS Score Sum  1    "

## 2024-12-01 NOTE — Progress Notes (Signed)
 Occupational Therapy Treatment Patient Details Name: Tara Hanna MRN: [id number] DOB: 1976/11/15 Today's Date: 12/01/2024   History of present illness Tara Hanna is a 48yoF who comes to [facility] on 11/26/24 for acute onset SOB, ABD pain, tachypnea. PMH: HTN, metastatic breast cancer with metastasis to liver, brain and bone currently on palliative chemotherapy. RVP (-) COVID, flu, RSV. Workup revealing of progression of metastatic disease process. MRI brain was obtained revealing an acute embolic strokes.   OT comments  Upon entering the room, pt supine in bed and when asked how she is doing pt erupts into tears. Pt states,  I don't think I am going to make it out of this hospital. Pt reports getting bad news today and needing to have conversations with family members. She doesn't believe she can continue with current treatments. OT provided therapeutic use of self and provided pt with warm wash cloth to wipe eyes and face. Pt is a bit calmer and asking therapist to open some of the food on her tray. Pt needing set up A and eating a few bites of peaches on her tray. Phone begins ringing in her room and family members are calling as they hear she has had some difficult news. Pt declines any further mobility or ADL attempts at this time but thanks therapist and reports she is comfortable with all needed items within reach.        If plan is discharge home, recommend the following:  A little help with walking and/or transfers;A little help with bathing/dressing/bathroom;Assistance with cooking/housework;Assist for transportation;Help with stairs or ramp for entrance   Equipment Recommendations  BSC/3in1       Precautions / Restrictions Precautions Precautions: Fall Recall of Precautions/Restrictions: Intact              ADL either performed or assessed with clinical judgement   ADL Overall ADL's : Needs assistance/impaired                                       General  ADL Comments: pt needing assistance to set up and open containers on meal tray    Extremity/Trunk Assessment Upper Extremity Assessment Upper Extremity Assessment: Generalized weakness   Lower Extremity Assessment Lower Extremity Assessment: Generalized weakness        Vision Patient Visual Report: No change from baseline     Perception     Praxis     Communication Communication Communication: No apparent difficulties   Cognition Arousal: Alert Behavior During Therapy: Lability Cognition: No apparent impairments                               Following commands: Intact        Cueing   Cueing Techniques: Verbal cues             Pertinent Vitals/ Pain       Pain Assessment Pain Assessment: No/denies pain         Frequency  Min 2X/week        Progress Toward Goals  OT Goals(current goals can now be found in the care plan section)  Progress towards OT goals: Progressing toward goals      AM-PAC OT 6 Clicks Daily Activity     Outcome Measure   Help from another person eating meals?: None Help from another person taking care of personal  grooming?: A Little Help from another person toileting, which includes using toliet, bedpan, or urinal?: A Little Help from another person bathing (including washing, rinsing, drying)?: A Little Help from another person to put on and taking off regular upper body clothing?: A Little Help from another person to put on and taking off regular lower body clothing?: A Little 6 Click Score: 19    End of Session Equipment Utilized During Treatment: Oxygen  OT Visit Diagnosis: Unsteadiness on feet (R26.81);Muscle weakness (generalized) (M62.81)   Activity Tolerance Patient limited by fatigue   Patient Left in bed;with call bell/phone within reach;with bed alarm set   Nurse Communication Mobility status        Time: 8942-8890 OT Time Calculation (min): 12 min  Charges: OT General Charges $OT Visit:  1 Visit OT Treatments $Self Care/Home Management : 8-22 mins  Tara Claude, MS, OTR/L , CBIS ascom [phone]  12/01/2024, 2:41 PM

## 2024-12-01 NOTE — TOC Progression Note (Signed)
 Transition of Care [facility]) - Progression Note    Patient Details  Name: Tara Hanna MRN: [id number] Date of Birth: 1976/02/17  [facility] of [facility]) CM/SW Contact  [name], LCSW Phone Number: 12/01/2024, 10:08 AM  Clinical Narrative:  Gave home health offers to patient to review and determine preference. Patient is agreeable to a 3-in-1 at discharge.   Expected Discharge Plan: Home/Self Care Barriers to Discharge: Continued Medical Work up               Expected Discharge Plan and Services     Post Acute Care Choice: NA Living arrangements for the past 2 months: Single Family Home                                       Social Drivers of Health (SDOH) Interventions SDOH Screenings   Food Insecurity: No Food Insecurity (11/27/2024)  Housing: Unknown (11/27/2024)  Transportation Needs: No Transportation Needs (11/27/2024)  Utilities: Not At Risk (11/27/2024)  Recent Concern: Utilities - At Risk (11/08/2024)  Depression (PHQ2-9): Low Risk (11/08/2024)  Financial Resource Strain: High Risk (03/21/2022)   Received from [facility]  Tobacco Use: Medium Risk (11/26/2024)    Readmission Risk Interventions    11/29/2024   10:25 AM  Readmission Risk Prevention Plan  Transportation Screening Complete  PCP or Specialist Appt within 5-7 Days Complete  Medication Review (RN CM) Complete

## 2024-12-01 NOTE — Progress Notes (Signed)
 "    Palliative Medicine [facility] at [facility] [phone] [phone]   Name: Tara Hanna Date: 12/01/2024 MRN: [id number]  DOB: 09/04/1976  Patient Care Team: System, Provider Not In as PCP - General Georgina Shasta POUR, RN as Oncology Nurse Navigator Rennie Cindy SAUNDERS, MD as Consulting Physician (Oncology)    REASON FOR CONSULTATION: Tara Hanna is a 49 y.o. 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  Patient most recently treated with cycle 1 Doxil  on 11/22/2024.  She was subsequently hospitalized with abnormal LFTs and sepsis from pneumonia.  Palliative care was consulted to address goals. .   CODE STATUS: Full code  PAST MEDICAL HISTORY: Past Medical History:  Diagnosis Date   Breast cancer (HCC)    Fibroids    Hypertension     PAST SURGICAL HISTORY:  Past Surgical History:  Procedure Laterality Date   ABDOMINAL HYSTERECTOMY     BREAST LUMPECTOMY Left 2024    HEMATOLOGY/ONCOLOGY HISTORY:  Oncology History Overview Note  Stage IIIB Left breast IDC (cT2, N1, Mx, G3, ER 2%, PR- , HER2-); functionally TNBC -S/p PRAD study, involving a single dose of pembrolizumab  with radiation prior to starting standard chemo plus immunotherapy. She was randomized to no upfront RT boost. She has had multiple delays in her chemotherapy treatment for various reasons. Including infection and cytopenias. She completed last dose on 10/27/2022.   She is s/p left breast lumpectomy and axillary LN dissection of axillary staging on 11/27/2022 with 9 of 28 lymph nodes positive for macro metastatic carcinoma with the size of the largest tumor deposit 33 mm, treatment effect identified in multiple lymph nodes, extensive lymphovascular space invasion, and extranodal extension present less than 2 mm. Residual left breast invasive ductal carcinoma  with tumor size 64 mm in greatest dimension ER+(2%), PR -, HER2- (IHC 1+). She has Residual Cancer Burden Class: RCB-III   Adjuvant systemic therapy: -Previously discussed option of enrolling in the ASCENT-05/OptimICE-RD (AFT-65): Phase 3, randomized, open-label study of adjuvant sacituzumab govitecan + pembrolizumab  (pembro) vs pembro  capecitabine in patients with triple-negative breast cancer and residual disease. After meeting with study coordinator she declined.  -Proceeded with SOC using Capecitabine while continuing Pembrolizumab  every 6 weeks (last cycle on 11/26/2022)  # Recurrent TNBC Metastatic breast cancer  1st line-BRE18-360 Phase I/II Study included Olapraib and Durvalumab 2nd line-Sacituzumab  3rd line- Enhertu  Clinical trials: [id number]. TRADE-DXD not eligible since previous Saci. She is not eligibile for UARMR941 given brain mets.  She was completing her last cycle of capecitabine when she unfortunately presented with seizure like symptoms (Facial twitching-10/2023). Brain MRI showed 2 enhancing intracranial masses measuring up to 1.8cm in the right frontal lobe and 1.2cm in the posterior right parietal lobe with surrounding vasogenic edema. We obtained restaging at the time of discovering CNS progression; with no radiographic evidence of systemic disease. She enrolled in the BRE18-360 Phase I/II Study of Stereotactic Radiosurgery with Concurrent Administration of DNA Damage Response (DDR) Inhibitor (Olaparib) Followed by Adjuvant Combination of Durvalumab ([id number]) and Physician's Choice Systemic Therapy in Subjects with Breast Cancer Brain Metastases. Patient consented and started treatment 11/20/23. Olaparib chosen for systemic therapy given absence of systemic disease at the time.  She completed Cyber knife to the brain on 12/03/23. She remained on study for approximately 6 months before noted progression.  -May 2025 POD new pulmonary nodules with mediastinal and bilateral  hilar  lymphadenopathy.There was also indeterminate foci along the hepatic dome. We discussed obtaining biopsy of distant site of disease since she initially presented with no extracranial disease. EBUS and mediastinal LN FNA was completed on 05/04/2024 confirming metastatic TNBC. Sample sent for tempus testing.We proceeded with SOC sacituzumab govitecan at this time  -August 2025. She is s/p C4 she is tolerating okay; although she reports significant fatigue a few days following treatment each week. She is concern about the dosing and inquires if a dose reduction is possible. She describes grade 2/3 fatigue interfering with daily activities. We discussed DR in effort to improve tolerance. Of note She did have tx hold last cycle due to neutropenia, we have add GSCF support Day 10 patient self injects.   S/p Sacituzumab 7.5 mg/kg Days 1,8 every 21 days ( DR starting C4) and Pegfilgrastim  6 mg on day 10  -September 2025. She had progressive disease with new bone lesions on 08/02/24 and a pathological fracture.Reports weight gain and new lower extremity swelling while on Sacituzumab. Voiced that she would like to proceed with standard of care treatment as opposed to clinical trials. We discussed starting Enhertu and after discussing general side effects, she agreed. We weill have our pharmacist reach out for formal chemo education. Her father had just passed away this weekend and is arranging the funeral; she would like to resume treatment in 2 weeks once she returns. - 08/19/2024 C1D1 Enhertu with Zometa. Here to begin treatment. Labs are adequate for therapy. Will proceed with first cycle chemotherapy with current dose and schedule as planned. Reviewed antiemetic plan. -09/30/2024 C3D1 Enhertu. Overall tolerated previous cycle of Enhertu. Labs appropriate for treatment today. We discussed completing upcoming CT CAP + bone scan to assess response prior to C4.  -10/11/24 MRI brain: New enhancing lesions within the right  cerebellar hemisphere and left occipital lobe. Right frontal subcortical lesion measuring 1.3 x 0.7 cm with minimal surrounding edema, previously 1.4 x 0.9 cm. Interval size increase of a right parieto-occipital enhancing lesion (POD vs post-radiation changes)  -10/18/24 CT A/P: Numerous new ill-defined hypoattenuating foci are seen scattered throughout the hepatic parenchyma, which are worrisome for potential new sites of metastasis. New soft tissue nodule within the subcutaneous tissues along the left lateral chest wall, which measures up to 1.3 cm in greatest axial dimension, also concerning for new metastases. -10/25/24: C4 Enhertu cancelled given POD on scans. Discussed 4th line liposomal doxorubicin .   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 -10/11/24 MRI brain showed new enhancing lesions within the right cerebellar hemisphere and left occipital lobe and Interval size increase of a right parieto-occipital enhancing lesion (unclear if POD vs post-radiation changes)  -started dexamethasone  4 mg daily given vasogenic edema seen on 09/2024 brain MRI. -Continue to follow Neurology. She was switched back to Keppra  per Neuro.     Cancer of right breast metastatic to brain [facility])  11/08/2024 Initial Diagnosis   Cancer of right breast metastatic to brain [facility])   11/22/2024 -  Chemotherapy   Patient is on Treatment Plan : BREAST Liposomal Doxorubicin  (50) q28d       ALLERGIES:  has no known allergies.  MEDICATIONS:  Current Facility-Administered Medications  Medication Dose Route Frequency Provider Last Rate Last Admin   0.9 %  sodium chloride  infusion (Manually program via Guardrails IV Fluids)   Intravenous Once Tariq, Hassan, MD  acetaminophen  (TYLENOL ) tablet 650 mg  650 mg Oral Q6H PRN Khan, Ghalib, MD   650 mg at 12/01/24 0247   Or   acetaminophen  (TYLENOL ) suppository 650 mg  650 mg Rectal Q6H  PRN Khan, Ghalib, MD       ALPRAZolam  (XANAX ) tablet 0.25 mg  0.25 mg Oral BID PRN Cosette Blackwater, MD       Chlorhexidine  Gluconate Cloth 2 % PADS 6 each  6 each Topical Daily Fernand Prost, MD   6 each at 12/01/24 1018   feeding supplement (ENSURE PLUS HIGH PROTEIN) liquid 237 mL  237 mL Oral BID BM Jens Durand, MD   237 mL at 12/01/24 1016   guaiFENesin -dextromethorphan  (ROBITUSSIN DM) 100-10 MG/5ML syrup 5 mL  5 mL Oral Q4H PRN Tariq, Hassan, MD   5 mL at 12/01/24 0425   HYDROmorphone  (DILAUDID ) injection 0.5 mg  0.5 mg Intravenous Q3H PRN Khan, Ghalib, MD   0.5 mg at 11/27/24 0040   HYDROmorphone  (DILAUDID ) tablet 2 mg  2 mg Oral Q4H PRN Khan, Ghalib, MD   2 mg at 11/30/24 2138   ipratropium-albuterol  (DUONEB) 0.5-2.5 (3) MG/3ML nebulizer solution 3 mL  3 mL Nebulization Q6H PRN Fernand Prost, MD       levETIRAcetam  (KEPPRA ) tablet 500 mg  500 mg Oral BID Jens Durand, MD   500 mg at 12/01/24 1017   ondansetron  (ZOFRAN ) tablet 4 mg  4 mg Oral Q6H PRN Khan, Ghalib, MD   4 mg at 11/30/24 1249   Or   ondansetron  (ZOFRAN ) injection 4 mg  4 mg Intravenous Q6H PRN Khan, Ghalib, MD       Oral care mouth rinse  15 mL Mouth Rinse PRN Jens Durand, MD       senna-docusate (Senokot-S) tablet 1 tablet  1 tablet Oral QHS PRN Fernand Prost, MD       sodium chloride  flush (NS) 0.9 % injection 10-40 mL  10-40 mL Intracatheter Q12H Fernand Prost, MD   10 mL at 12/01/24 1018   sodium chloride  flush (NS) 0.9 % injection 10-40 mL  10-40 mL Intracatheter PRN Fernand Prost, MD       sterile water  (preservative free) injection            sterile water  (preservative free) injection             VITAL SIGNS: BP (!) 111/92   Pulse (!) 127   Temp 99.5 F (37.5 C) (Oral)   Resp (!) 24   Ht 5' 4 (1.626 m)   Wt 220 lb 7.4 oz (100 kg)   SpO2 99%   BMI 37.84 kg/m  Filed Weights   11/29/24 0500 11/30/24 0500 12/01/24 0500  Weight: 222 lb 14.2 oz (101.1 kg) 222 lb 10.6 oz (101 kg) 220 lb 7.4 oz (100 kg)     Estimated body mass index is 37.84 kg/m as calculated from the following:   Height as of this encounter: 5' 4 (1.626 m).   Weight as of this encounter: 220 lb 7.4 oz (100 kg).  LABS: CBC:    Component Value Date/Time   WBC 2.6 (L) 12/01/2024 0412   HGB 8.7 (L) 12/01/2024 0412   HGB 11.1 (L) 11/22/2024 0815   HCT 26.2 (L) 12/01/2024 0412   PLT 12 (LL) 12/01/2024 0412   PLT 133 (L) 11/22/2024 0815   MCV 73.4 (L) 12/01/2024 0412   NEUTROABS 2.1 12/01/2024 0412   LYMPHSABS 0.3 (L) 12/01/2024 0412   MONOABS 0.0 (L) 12/01/2024  0412   EOSABS 0.0 12/01/2024 0412   BASOSABS 0.0 12/01/2024 0412   Comprehensive Metabolic Panel:    Component Value Date/Time   NA 135 12/01/2024 0412   K 4.6 12/01/2024 0412   CL 98 12/01/2024 0412   CO2 20 (L) 12/01/2024 0412   BUN 6 12/01/2024 0412   CREATININE 0.63 12/01/2024 0412   CREATININE 0.70 11/22/2024 0815   GLUCOSE 112 (H) 12/01/2024 0412   CALCIUM 8.1 (L) 12/01/2024 0412   AST 486 (H) 11/30/2024 0541   AST 180 (HH) 11/22/2024 0815   ALT 362 (H) 11/30/2024 0541   ALT 302 (HH) 11/22/2024 0815   ALKPHOS 515 (H) 11/30/2024 0541   BILITOT 1.2 11/30/2024 0541   BILITOT 0.7 11/22/2024 0815   PROT 5.8 (L) 11/30/2024 0541   ALBUMIN 2.6 (L) 11/30/2024 0541    RADIOGRAPHIC STUDIES: US  Venous Img Upper Bilat (DVT) Result Date: 12/01/2024 CLINICAL DATA:  Shortness of breath.  Metastatic breast cancer. EXAM: BILATERAL UPPER EXTREMITY VENOUS DOPPLER ULTRASOUND TECHNIQUE: Gray-scale sonography with graded compression, as well as color Doppler and duplex ultrasound were performed to evaluate the bilateral upper extremity deep venous systems from the level of the subclavian vein and including the jugular, axillary, basilic, radial, ulnar and upper cephalic vein. Spectral Doppler was utilized to evaluate flow at rest and with distal augmentation maneuvers. COMPARISON:  None Available. FINDINGS: RIGHT UPPER EXTREMITY Internal Jugular Vein: No evidence of  thrombus. Normal compressibility, respiratory phasicity and color Doppler flow. Subclavian Vein: No evidence of thrombus. Normal color Doppler flow and phasicity. Axillary Vein: No evidence of thrombus. Normal compressibility, respiratory phasicity and response to augmentation. Cephalic Vein: Not visualized. Basilic Vein: No evidence of thrombus. Normal compressibility, respiratory phasicity and response to augmentation. Brachial Veins: No evidence of thrombus. Normal compressibility, respiratory phasicity and response to augmentation. Radial Veins: No evidence of thrombus. Normal compressibility and color Doppler flow. Ulnar Veins: No evidence of thrombus. Normal compressibility and color Doppler flow. Other Findings:  None. LEFT UPPER EXTREMITY Internal Jugular Vein: No evidence of thrombus. Normal compressibility, respiratory phasicity and color Doppler flow. Subclavian Vein: No evidence of thrombus. Normal color Doppler flow and phasicity. Axillary Vein: No evidence of thrombus. Normal compressibility, respiratory phasicity and response to augmentation. Cephalic Vein: No evidence of thrombus. Normal compressibility, respiratory phasicity and response to augmentation. Basilic Vein: No evidence of thrombus. Normal compressibility, respiratory phasicity and response to augmentation. Brachial Veins: No evidence of thrombus. Normal compressibility, respiratory phasicity and response to augmentation. Radial Veins: No evidence of thrombus. Normal compressibility and color Doppler flow. Ulnar Veins: No evidence of thrombus. Normal compressibility and color Doppler flow. Other Findings:  None. IMPRESSION: No evidence of DVT within either upper extremity. Electronically Signed   By: Juliene Balder M.D.   On: 12/01/2024 09:52   US  Venous Img Lower Bilateral (DVT) Result Date: 12/01/2024 CLINICAL DATA:  Evaluate for deep venous thrombosis. Metastatic breast cancer. Acute hypoxic respiratory failure. EXAM: BILATERAL LOWER  EXTREMITY VENOUS DOPPLER ULTRASOUND TECHNIQUE: Gray-scale sonography with graded compression, as well as color Doppler and duplex ultrasound were performed to evaluate the lower extremity deep venous systems from the level of the common femoral vein and including the common femoral, femoral, profunda femoral, popliteal and calf veins including the posterior tibial, peroneal and gastrocnemius veins when visible. Spectral Doppler was utilized to evaluate flow at rest and with distal augmentation maneuvers in the common femoral, femoral and popliteal veins. COMPARISON:  None Available. FINDINGS: RIGHT LOWER EXTREMITY Common Femoral Vein: No  evidence of thrombus. Normal compressibility, respiratory phasicity and response to augmentation. Saphenofemoral Junction: No evidence of thrombus. Normal compressibility and flow on color Doppler imaging. Profunda Femoral Vein: No evidence of thrombus. Normal compressibility and flow on color Doppler imaging. Femoral Vein: No evidence of thrombus. Normal compressibility, respiratory phasicity and response to augmentation. Popliteal Vein: No evidence of thrombus. Normal compressibility, respiratory phasicity and response to augmentation. Calf Veins: No evidence of thrombus. Normal compressibility and flow on color Doppler imaging. Other Findings:  None. LEFT LOWER EXTREMITY Common Femoral Vein: No evidence of thrombus. Normal compressibility, respiratory phasicity and response to augmentation. Saphenofemoral Junction: No evidence of thrombus. Normal compressibility and flow on color Doppler imaging. Profunda Femoral Vein: No evidence of thrombus. Normal compressibility and flow on color Doppler imaging. Femoral Vein: No evidence of thrombus. Normal compressibility, respiratory phasicity and response to augmentation. Popliteal Vein: No evidence of thrombus. Normal compressibility, respiratory phasicity and response to augmentation. Calf Veins: No evidence of thrombus. Normal  compressibility and flow on color Doppler imaging. Other Findings:  None. IMPRESSION: No evidence of deep venous thrombosis in either lower extremity. Electronically Signed   By: Juliene Balder M.D.   On: 12/01/2024 09:47   CT ANGIO HEAD NECK W WO CM Result Date: 11/30/2024 EXAM: CT HEAD WITHOUT CTA HEAD AND NECK WITH 11/30/2024 10:04:02 AM TECHNIQUE: CTA of the head and neck was performed with the administration of 75 mL of iohexol  (OMNIPAQUE ) 350 MG/ML injection. Noncontrast CT of the head with reconstructed 2-D images are also provided for review. Multiplanar 2D and/or 3D reformatted images are provided for review. Automated exposure control, iterative reconstruction, and/or weight based adjustment of the mA/kV was utilized to reduce the radiation dose to as low as reasonably achievable. COMPARISON: MRI head 11/28/2024. CLINICAL HISTORY: Stroke/TIA, determine embolic source. Metastatic breast cancer. FINDINGS: CT HEAD: BRAIN AND VENTRICLES: The small acute right cerebellar and left cerebral infarcts on MRI are largely occult by CT. There is a 3 mm hypodensity in the left thalamus suggestive of a lacunar infarct without a corresponding abnormality on the recent MRI. Calcification and mild surrounding edema or gliosis are noted in the right frontal and right parietal lobes at the sites of known metastases. No acute intracranial hemorrhage, midline shift, hydrocephalus, or extra-axial fluid collection is identified. Cerebral volume is normal. ORBITS: Abnormal extraconal soft tissue in the superolateral aspect of the left orbit anteriorly with extension into the preseptal soft tissues and with this abnormal soft tissue spanning approximately 3 cm in AP dimension. SINUSES AND MASTOIDS: No acute abnormality. CTA NECK: AORTIC ARCH AND ARCH VESSELS: Normal variant aortic arch branching pattern with common origin of the brachiocephalic and left common carotid arteries. No dissection or arterial injury. No significant  stenosis of the brachiocephalic or subclavian arteries. CERVICAL CAROTID ARTERIES: Partially retropharyngeal course of both proximal internal carotid arteries. No dissection, arterial injury, or hemodynamically significant stenosis by NASCET criteria. CERVICAL VERTEBRAL ARTERIES: Codominant vertebral arteries. No dissection, arterial injury, or significant stenosis. LUNGS AND MEDIASTINUM: Partially visualized mediastinal lymphadenopathy and left upper lobe consolidation, more fully evaluated on a recent CT chest from 11/26/2024. SOFT TISSUES: Partially visualized right jugular port a cath. BONES: Dental caries. Scattered small lucent spine lesions, for example involving the C4, C5, and C7 vertebral bodies potentially reflecting metastases. Skull lesions on MRI are not clearly visible by CT. CTA HEAD: ANTERIOR CIRCULATION: The intracranial internal carotid arteries are widely patent. ACAs and MCAs are patent without evidence of a proximal branch occlusion or significant  proximal stenosis. No aneurysm. POSTERIOR CIRCULATION: The intracranial vertebral arteries are widely patent to the basilar. Patent PICA and SCA origins are visualized bilaterally. The basilar artery is widely patent. There are large posterior communicating arteries and hypoplastic P1 segments bilaterally. Both PCAs are patent without evidence of a significant proximal stenosis. No aneurysm. OTHER: No dural venous sinus thrombosis on this non-dedicated study. IMPRESSION: 1. Small acute right cerebellar and left cerebral infarcts on MRI are largely occult by CT, however, there may be an interval acute lacunar infarct in the left thalamus. 2. No large vessel occlusion no significant stenosis in the head or neck. 3. Known metastatic breast cancer including brain and osseous metastases. Abnormal left orbital soft tissue is also suspicious for metastasis. Electronically signed by: Dasie Hamburg MD 11/30/2024 10:37 AM EST RP Workstation: [id number]   MR  BRAIN W WO CONTRAST Result Date: 11/29/2024 EXAM: MRI BRAIN WITH AND WITHOUT CONTRAST 11/28/2024 02:20:35 PM TECHNIQUE: Multiplanar multisequence MRI of the head/brain was performed with and without the administration of intravenous contrast. CONTRAST: 10 mL gadobutrol  (GADAVIST ) 1 MMOL/ML injection 10 mL GADOBUTROL  1 MMOL/ML IV SOLN. COMPARISON: MR Head 11/02/2023, outside brain MRI 11/08/2024. CLINICAL HISTORY: 49 year old female with breast cancer, metastatic disease evaluation. Metastatic disease on CT chest, abdomen, and pelvis recently. Known brain metastases on MRI last year. FINDINGS: BRAIN AND VENTRICLES: No acute intracranial hemorrhage. No mass effect or midline shift. No hydrocephalus. The sella is unremarkable. Normal flow voids. The dural venous sinuses are enhancing and appear patent. nonenhancing scattered foci of restricted diffusion which are generally subcentimeter in both the right cerebellar hemisphere (series 10 images 13 through 16), in the medial left thalamus on series 10 image 31, and scattered in the posterior superior left frontal lobe on series 10 image 45 minimal associated T2 and FLAIR hyperintensity. On SWI mild chronic hemosiderin associated with the metastatic lesions is stable. No acute intracranial hemorrhage or mass effect. Multiple Brain Lesions: 1. Small nodular and solid enhancing 6 mm lesion right inferior cerebellum on series 20 image 37 appears slightly larger on both axial and coronal postcontrast images, up to 6 mm now versus 4 to 5 mm last month. No regional edema or mass effect. 2. Nearby 2 additional punctate enhancing bilateral inferior cerebellar metastases on all 3 postcontrast imaging sequences (series 20 images 35 and 39), and the lesion on image 35 in the right hemisphere appears increased from last month. 3. Posterior right hemisphere partially solid and nodular enhancing metastases on series 20 image 103 measuring 18 mm long axis appears stable. Regional T2 and  FLAIR hyperintensity also stable and no significant regional mass effect. 4. Posterior right frontal operculum 13 mm enhancing nodular, partially solid metastasis appears stable on series 20 image 116. Mild regional T2 and FLAIR hyperintensity appears stable without mass effect. No convincing leptomeningeal enhancement. Small developmental venous anomaly (normal variant) suspected in the left occipital lobe on series 20 image 87 and stable from 2024. Borderline to mild asymmetric right hemisphere dural pachymeningeal thickening also noted (series 20 image 102), and appears new since 2024 but not significantly changed from last month. No areas of pachymeningeal nodularity. No convincing leptomeningeal enhancement. ORBITS: No acute abnormality. SINUSES: No acute abnormality. BONES AND SOFT TISSUES: Progressive bone marrow signal heterogeneity since 2024 compatible with widespread osseous metastatic disease. No destructive osseous lesion identified. Numerous small nonspecific FLAIR hyperintense scalp nodules which are mostly new since 2024, not significantly changed from last month and nonspecific. Grossly negative visible spinal cord. IMPRESSION: 1.  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 No associated acute hemorrhage or mass effect. 2. Superimposed Five enhancing intracranial metastases, with slight interval progression in the right cerebellum since outside MRI last month. Subtle right hemisphere pachymeningeal thickening and enhancement also appears increased since 2024, with underlying diffuse osseous metastatic disease Electronically signed by: Helayne Hurst MD 11/29/2024 04:22 AM EST RP Workstation: [id number]   CT Angio Chest Pulmonary Embolism (PE) W or WO Contrast Result Date: 11/26/2024 EXAM: CTA CHEST 11/26/2024 11:37:29 PM TECHNIQUE: CTA of the chest was performed without and with the administration of 75 mL of iohexol   (OMNIPAQUE ) 350 MG/ML injection. Multiplanar reformatted images are provided for review. MIP images are provided for review. Automated exposure control, iterative reconstruction, and/or weight based adjustment of the mA/kV was utilized to reduce the radiation dose to as low as reasonably achievable. COMPARISON: 11/26/2024 CLINICAL HISTORY: Pulmonary embolism (PE) suspected, high prob. FINDINGS: PULMONARY ARTERIES: Pulmonary arteries are adequately opacified for evaluation. No acute pulmonary embolus. Main pulmonary artery is normal in caliber. MEDIASTINUM: The heart and pericardium demonstrate no acute abnormality. There is no acute abnormality of the thoracic aorta. LYMPH NODES: Bilateral hilar and mediastinal adenopathy again noted, unchanged. LUNGS AND PLEURA: Left upper lobe nodular airspace disease as well as several small pulmonary nodules again noted, unchanged. Bibasilar linear scarring or atelectasis. No evidence of pleural effusion or pneumothorax. UPPER ABDOMEN: Numerous hepatic metastases, better seen on earlier CT. 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 No acute soft tissue abnormality. IMPRESSION: 1. No evidence of pulmonary embolism. 2. Stable left upper lobe nodular airspace disease and several small pulmonary nodules. 3. Stable bilateral hilar and mediastinal adenopathy. 4. 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 5. Numerous hepatic metastases, better seen on earlier CT. Electronically signed by: Franky Crease MD 11/26/2024 11:45 PM EST RP Workstation: [id number]   CT CHEST ABDOMEN PELVIS W CONTRAST Result Date: 11/26/2024 CLINICAL DATA:  Sepsis. Breast cancer with history of liver and spinal metastasis. EXAM: CT CHEST, ABDOMEN, AND PELVIS WITH  CONTRAST TECHNIQUE: Multidetector CT imaging of the chest, abdomen and pelvis was performed following the standard protocol during bolus administration of intravenous contrast. RADIATION DOSE REDUCTION: This exam was performed according to the departmental dose-optimization program which includes automated exposure control, adjustment of the mA and/or kV according to patient size and/or use of iterative reconstruction technique. CONTRAST:  OMNIPAQUE  IOHEXOL  300 MG/ML  SOLN COMPARISON:  10/18/2024, 04/30/2008. FINDINGS: CT CHEST FINDINGS Cardiovascular: The heart is normal in size and there is no pericardial effusion. The distal tip of a right chest port terminates at the cavoatrial junction. The aorta and pulmonary trunk are normal in caliber. Mediastinum/Nodes: Enlarged lymph nodes are present in the mediastinum measuring up to 1.9 cm in the precarinal space. There are prominent lymph nodes in the hilar regions bilaterally. Lungs/Pleura: Paraseptal and centrilobular emphysematous changes are noted in the lungs. There are scattered pulmonary nodules bilaterally, increased in size and number from the prior exam. For example there is an 8 mm nodule in the posterior segment of the left upper lobe, axial image 37, new from the previous exam. Subpleural consolidation is noted in the anterior aspect of the left upper lobe with communication of soft tissue  fullness at the left hilum and, increased from the prior exam. Strandy opacities are noted at the lung bases, possible atelectasis, scarring, or infiltrate. No effusion or pneumothorax is seen. Musculoskeletal: Enlarged lymph nodes are noted in the left chest wall. Surgical clips in a small fluid collection is present in the left axilla measuring 2.6 cm, possible seroma and unchanged from the previous exam. Sclerotic lesions are noted in the thoracic spine, compatible with known metastatic disease. There are mild compression deformities in the superior endplates at  T9 and T11, likely present on the prior exam. CT ABDOMEN PELVIS FINDINGS Hepatobiliary: Innumerable hypodense masses are present in the liver, the largest in the left lobe measuring 6.3 cm and increased from the prior exam. No biliary ductal dilatation. The gallbladder is without stones. There suggestion of gallbladder wall thickening, which may be related to local inflammatory changes in the liver. Pancreas: Unremarkable. No pancreatic ductal dilatation or surrounding inflammatory changes. Spleen: Normal in size without focal abnormality. Adrenals/Urinary Tract: The adrenal glands are stable. The kidneys enhance symmetrically. No renal calculus or hydronephrosis. The bladder is unremarkable. Stomach/Bowel: There is a small hiatal hernia. The stomach is otherwise within normal limits. No bowel obstruction, free air, or pneumatosis is seen. Appendix appears normal. Vascular/Lymphatic: No significant vascular findings are present. Enlarged lymph nodes are seen in the gastrohepatic ligament and porta hepatis. Reproductive: Status post hysterectomy. No adnexal masses. Other: No abdominopelvic ascites. A fat containing periumbilical hernia is present. Musculoskeletal: Degenerative changes are present in the lumbar spine. A sclerotic region is present in the proximal sacrum, possible metastatic disease. No acute fracture is seen. IMPRESSION: 1. Left upper lobe consolidation extending to the left hilum with strandy opacities in the lungs bilaterally, possible infiltrate, scarring, or worsening neoplastic process. 2. Interval increase in number and size of hepatic metastasis and pulmonary nodules. 3. Enlargement of mediastinal, hilar, and upper abdominal lymph adenopathy, likely representing metastatic disease. 4. Osteoblastic metastatic disease. Electronically Signed   By: Leita Birmingham M.D.   On: 11/26/2024 15:44   DG Chest Port 1 View if patient is in a treatment room. Result Date: 11/26/2024 EXAM: 1 VIEW(S) XRAY OF  THE CHEST 11/26/2024 01:02:00 PM COMPARISON: None available. CLINICAL HISTORY: Suspected Sepsis FINDINGS: LINES, TUBES AND DEVICES: Right chest Port-A-Cath in place with tip projecting over the right atrium approximately 2 cm distal to the superior cavoatrial junction. LUNGS AND PLEURA: Spontaneous densities overlying the bilateral lower lung zones are likely external to the patient. Band-like opacity at right lung base. No pleural effusion. No pneumothorax. HEART AND MEDIASTINUM: No acute abnormality of the cardiac and mediastinal silhouettes. BONES AND SOFT TISSUES: Left axillary surgical clips noted. No acute osseous abnormality. IMPRESSION: 1. Band-like opacity at the right lung base, favor subsegmental atelectasis. 2. Right chest Port-A-Cath tip projects in the right atrium, approximately 2 cm distal to the superior cavoatrial junction. Electronically signed by: Morgane Naveau MD 11/26/2024 01:10 PM EST RP Workstation: [id number]   ECHOCARDIOGRAM COMPLETE Result Date: 11/16/2024    ECHOCARDIOGRAM REPORT   Patient Name:   Tara Hanna Date of Exam: 11/16/2024 Medical Rec #:  [id number]       Height:       64.0 in Accession #:    [id number]      Weight:       216.5 lb Date of Birth:  Jun 24, 1976      BSA:          2.023 m Patient Age:    48 years  BP:           159/114 mmHg Patient Gender: F               HR:           112 bpm. Exam Location:  [facility] Procedure: 2D Echo, Cardiac Doppler and Color Doppler (Both Spectral and Color            Flow Doppler were utilized during procedure). Indications:     Chemo Z09  History:         Patient has no prior history of Echocardiogram examinations.  Sonographer:     Rosina Dunk Referring Phys:  [id number] CINDY SAUNDERS [facility] Diagnosing Phys: Caron Poser  Sonographer Comments: Image acquisition challenging due to respiratory motion. IMPRESSIONS  1. Very technically difficult study.  2. Left ventricular ejection fraction, by estimation, is 50 to 55%. Left  ventricular ejection fraction by 2D MOD biplane is 51.9 %. The left ventricle has low normal function. Left ventricular endocardial border not optimally defined to evaluate regional wall motion. Indeterminate diastolic filling due to E-A fusion.  3. Right ventricular systolic function is normal. The right ventricular size is normal.  4. The mitral valve is normal in structure. No evidence of mitral valve regurgitation. No evidence of mitral stenosis.  5. The aortic valve has an indeterminant number of cusps. Aortic valve regurgitation is not visualized. No aortic stenosis is present. Comparison(s): No prior Echocardiogram. FINDINGS  Left Ventricle: Left ventricular ejection fraction, by estimation, is 50 to 55%. Left ventricular ejection fraction by 2D MOD biplane is 51.9 %. The left ventricle has low normal function. Left ventricular endocardial border not optimally defined to evaluate regional wall motion. The left ventricular internal cavity size was normal in size. There is no left ventricular hypertrophy. Indeterminate diastolic filling due to E-A fusion. Right Ventricle: The right ventricular size is normal. Right vetricular wall thickness was not well visualized. Right ventricular systolic function is normal. Left Atrium: Left atrial size was not well visualized. Right Atrium: Right atrial size was not well visualized. Pericardium: There is no evidence of pericardial effusion. Presence of epicardial fat layer. Mitral Valve: The mitral valve is normal in structure. No evidence of mitral valve regurgitation. No evidence of mitral valve stenosis. MV peak gradient, 3.4 mmHg. The mean mitral valve gradient is 1.0 mmHg. Tricuspid Valve: The tricuspid valve is not well visualized. Tricuspid valve regurgitation is not demonstrated. No evidence of tricuspid stenosis. Aortic Valve: The aortic valve has an indeterminant number of cusps. Aortic valve regurgitation is not visualized. No aortic stenosis is present. Aortic  valve mean gradient measures 1.0 mmHg. Aortic valve peak gradient measures 1.9 mmHg. Aortic valve area, by VTI measures 5.25 cm. Pulmonic Valve: The pulmonic valve was not well visualized. Pulmonic valve regurgitation is trivial. No evidence of pulmonic stenosis. Aorta: The aortic root and ascending aorta are structurally normal, with no evidence of dilitation. Venous: The inferior vena cava was not well visualized. IAS/Shunts: The interatrial septum was not well visualized.  LEFT VENTRICLE PLAX 2D                        Biplane EF (MOD) LVIDd:         3.60 cm         LV Biplane EF:   Left LVIDs:         2.50 cm  ventricular LV PW:         1.00 cm                          ejection LV IVS:        0.90 cm                          fraction by LVOT diam:     2.20 cm                          2D MOD LV SV:         38                               biplane is LV SV Index:   19                               51.9 %. LVOT Area:     3.80 cm                                Diastology                                LV e' medial:    10.10 cm/s LV Volumes (MOD)               LV E/e' medial:  4.2 LV vol d, MOD    44.9 ml       LV e' lateral:   7.62 cm/s A2C:                           LV E/e' lateral: 5.6 LV vol d, MOD    41.1 ml A4C: LV vol s, MOD    25.7 ml A2C: LV vol s, MOD    17.9 ml A4C: LV SV MOD A2C:   19.2 ml LV SV MOD A4C:   41.1 ml LV SV MOD BP:    24.2 ml RIGHT VENTRICLE RV Basal diam:  3.30 cm     PULMONARY VEINS RV Mid diam:    2.10 cm     A Reversal Duration: 102.00 msec RV S prime:     11.70 cm/s  A Reversal Velocity: 56.60 cm/s TAPSE (M-mode): 1.6 cm      Diastolic Velocity:  47.80 cm/s                             S/D Velocity:        1.50                             Systolic Velocity:   73.90 cm/s LEFT ATRIUM             Index       RIGHT ATRIUM           Index LA diam:        2.60 cm 1.28 cm/m  RA Area:     11.60 cm LA Vol (A2C):   17.5 ml 8.65 ml/m  RA Volume:   26.80 ml  13.24 ml/m LA  Vol (A4C):   15.8 ml 7.81 ml/m LA Biplane Vol: 18.5 ml 9.14 ml/m  AORTIC VALVE                    PULMONIC VALVE AV Area (Vmax):    4.04 cm     PV Vmax:          0.76 m/s AV Area (Vmean):   3.75 cm     PV Vmean:         51.300 cm/s AV Area (VTI):     5.25 cm     PV VTI:           0.114 m AV Vmax:           69.00 cm/s   PV Peak grad:     2.3 mmHg AV Vmean:          45.700 cm/s  PV Mean grad:     1.0 mmHg AV VTI:            0.072 m      PR End Diast Vel: 6.25 msec AV Peak Grad:      1.9 mmHg     RVOT Peak grad:   2 mmHg AV Mean Grad:      1.0 mmHg LVOT Vmax:         73.40 cm/s LVOT Vmean:        45.100 cm/s LVOT VTI:          0.099 m LVOT/AV VTI ratio: 1.38  AORTA Ao Root diam: 3.00 cm Ao Asc diam:  3.20 cm MITRAL VALVE MV Area (PHT): 6.80 cm    SHUNTS MV Area VTI:   2.77 cm    Systemic VTI:  0.10 m MV Peak grad:  3.4 mmHg    Systemic Diam: 2.20 cm MV Mean grad:  1.0 mmHg    Pulmonic VTI:  0.075 m MV Vmax:       0.92 m/s MV Vmean:      50.0 cm/s MV Decel Time: 112 msec MV E velocity: 42.70 cm/s MV A velocity: 91.70 cm/s MV E/A ratio:  0.47 Tefl Teacher signed by Caron Poser Signature Date/Time: 11/16/2024/3:20:31 PM    Final     PERFORMANCE STATUS (ECOG) : 3 - Symptomatic, >50% confined to bed  Review of Systems Unless otherwise noted, a complete review of systems is negative.  Physical Exam General: NAD Cardiovascular: regular rate and rhythm Pulmonary: clear ant fields Abdomen: soft, nontender, + bowel sounds GU: no suprapubic tenderness Extremities: no edema, no joint deformities Skin: no rashes Neurological: Weakness but otherwise nonfocal  IMPRESSION: Follow-up visit.   MRI of the brain revealed several small acute strokes in both the left and right so that are Beller hemispheres without acute hemorrhage or mass effect.  Patient also had 5 enhancing intracranial metastases.  Lactic acid continues to rise, likely secondary to progressive hepatic metastases.  She has  pancytopenia, likely secondary to chemotherapy.  Overall, patient appears to have poor prognosis.   Met with patient and family following their conversation with hospitalist.  Together, we reviewed patient's multiple medical problems and high risk of mortality.  Patient continues to state that she wants to remain a full code/full scope of treatment.  We discussed the likely futility associated with resuscitative efforts in the setting of terminal cancer.  I encouraged patient and family to discuss in more detail.  They agreed to meet with  Dr. Rennie and me tomorrow.  PLAN: - Continue current scope of treatment - Family meeting tomorrow  Case and plan discussed with Dr. Rennie and Dr. Cosette  Time Total: 30 minutes  Visit consisted of counseling and education dealing with the complex and emotionally intense issues of symptom management and palliative care in the setting of serious and potentially life-threatening illness.Greater than 50%  of this time was spent counseling and coordinating care related to the above assessment and plan.  Signed by: Fonda Mower, PhD, NP-C   "

## 2024-12-01 NOTE — Progress Notes (Signed)
 This RN was told by Ceola Beals, night RN, that patient's Chest Port was not giving her blood return. No interventions performed by said nurse. This RN flushed pt's Chest Port to verify that there was no blood return. This RN plaved IV team order to troubleshoot. [place]ille [place], RN with vascular access arrived at bedside to perform CathFlow.   1330 Cathflow in place. Awaiting return of IV team to continue use of access.

## 2024-12-02 ENCOUNTER — Inpatient Hospital Stay: Admit: 2024-12-02 | Discharge: 2024-12-02 | Disposition: A

## 2024-12-02 DIAGNOSIS — J189 Pneumonia, unspecified organism: Secondary | ICD-10-CM | POA: Diagnosis not present

## 2024-12-02 DIAGNOSIS — I517 Cardiomegaly: Secondary | ICD-10-CM

## 2024-12-02 DIAGNOSIS — I088 Other rheumatic multiple valve diseases: Secondary | ICD-10-CM | POA: Diagnosis not present

## 2024-12-02 DIAGNOSIS — A419 Sepsis, unspecified organism: Secondary | ICD-10-CM | POA: Diagnosis not present

## 2024-12-02 DIAGNOSIS — Z515 Encounter for palliative care: Secondary | ICD-10-CM | POA: Diagnosis not present

## 2024-12-02 DIAGNOSIS — I6389 Other cerebral infarction: Secondary | ICD-10-CM

## 2024-12-02 LAB — CBC
HCT: 24.2 % — ABNORMAL LOW (ref 36.0–46.0)
Hemoglobin: 8 g/dL — ABNORMAL LOW (ref 12.0–15.0)
MCH: 23.7 pg — ABNORMAL LOW (ref 26.0–34.0)
MCHC: 33.1 g/dL (ref 30.0–36.0)
MCV: 71.6 fL — ABNORMAL LOW (ref 80.0–100.0)
Platelets: 11 K/uL — CL (ref 150–400)
RBC: 3.38 MIL/uL — ABNORMAL LOW (ref 3.87–5.11)
RDW: 22.1 % — ABNORMAL HIGH (ref 11.5–15.5)
WBC: 1.5 K/uL — ABNORMAL LOW (ref 4.0–10.5)
nRBC: 0 % (ref 0.0–0.2)

## 2024-12-02 LAB — HEPATIC FUNCTION PANEL
ALT: 341 U/L — ABNORMAL HIGH (ref 0–44)
AST: 663 U/L — ABNORMAL HIGH (ref 15–41)
Albumin: 2.5 g/dL — ABNORMAL LOW (ref 3.5–5.0)
Alkaline Phosphatase: 467 U/L — ABNORMAL HIGH (ref 38–126)
Bilirubin, Direct: 1.1 mg/dL — ABNORMAL HIGH (ref 0.0–0.2)
Indirect Bilirubin: 0.4 mg/dL (ref 0.3–0.9)
Total Bilirubin: 1.5 mg/dL — ABNORMAL HIGH (ref 0.0–1.2)
Total Protein: 5.4 g/dL — ABNORMAL LOW (ref 6.5–8.1)

## 2024-12-02 LAB — BPAM PLATELET PHERESIS
Blood Product Expiration Date: 202601082359
ISSUE DATE / TIME: 202601081406
Unit Type and Rh: 7300

## 2024-12-02 LAB — CBC WITH DIFFERENTIAL/PLATELET
Abs Immature Granulocytes: 0.02 K/uL (ref 0.00–0.07)
Basophils Absolute: 0 K/uL (ref 0.0–0.1)
Basophils Relative: 1 %
Eosinophils Absolute: 0 K/uL (ref 0.0–0.5)
Eosinophils Relative: 0 %
HCT: 22 % — ABNORMAL LOW (ref 36.0–46.0)
Hemoglobin: 7.5 g/dL — ABNORMAL LOW (ref 12.0–15.0)
Immature Granulocytes: 2 %
Lymphocytes Relative: 19 %
Lymphs Abs: 0.2 K/uL — ABNORMAL LOW (ref 0.7–4.0)
MCH: 24 pg — ABNORMAL LOW (ref 26.0–34.0)
MCHC: 34.1 g/dL (ref 30.0–36.0)
MCV: 70.5 fL — ABNORMAL LOW (ref 80.0–100.0)
Monocytes Absolute: 0 K/uL — ABNORMAL LOW (ref 0.1–1.0)
Monocytes Relative: 1 %
Neutro Abs: 0.8 K/uL — ABNORMAL LOW (ref 1.7–7.7)
Neutrophils Relative %: 77 %
Platelets: 15 K/uL — CL (ref 150–400)
RBC: 3.12 MIL/uL — ABNORMAL LOW (ref 3.87–5.11)
RDW: 21.9 % — ABNORMAL HIGH (ref 11.5–15.5)
WBC: 1 K/uL — CL (ref 4.0–10.5)
nRBC: 0 % (ref 0.0–0.2)

## 2024-12-02 LAB — ECHOCARDIOGRAM COMPLETE BUBBLE STUDY
Area-P 1/2: 5.75 cm2
S' Lateral: 2.1 cm

## 2024-12-02 LAB — BASIC METABOLIC PANEL WITH GFR
Anion gap: 17 — ABNORMAL HIGH (ref 5–15)
Anion gap: 19 — ABNORMAL HIGH (ref 5–15)
BUN: 7 mg/dL (ref 6–20)
BUN: 10 mg/dL (ref 6–20)
CO2: 22 mmol/L (ref 22–32)
CO2: 20 mmol/L — ABNORMAL LOW (ref 22–32)
Calcium: 8.2 mg/dL — ABNORMAL LOW (ref 8.9–10.3)
Calcium: 8.1 mg/dL — ABNORMAL LOW (ref 8.9–10.3)
Chloride: 98 mmol/L (ref 98–111)
Chloride: 96 mmol/L — ABNORMAL LOW (ref 98–111)
Creatinine, Ser: 0.67 mg/dL (ref 0.44–1.00)
Creatinine, Ser: 0.61 mg/dL (ref 0.44–1.00)
GFR, Estimated: >60 mL/min
GFR, Estimated: >60 mL/min
Glucose, Bld: 102 mg/dL — ABNORMAL HIGH (ref 70–99)
Glucose, Bld: 157 mg/dL — ABNORMAL HIGH (ref 70–99)
Potassium: 5 mmol/L (ref 3.5–5.1)
Potassium: 4.9 mmol/L (ref 3.5–5.1)
Sodium: 137 mmol/L (ref 135–145)
Sodium: 135 mmol/L (ref 135–145)

## 2024-12-02 LAB — PREPARE PLATELET PHERESIS: Unit division: 0

## 2024-12-02 MED ORDER — SODIUM CHLORIDE 0.9% IV SOLUTION: Freq: Once | INTRAVENOUS | Status: AC

## 2024-12-02 MED ORDER — FILGRASTIM-AAFI 480 MCG/0.8ML IJ SOSY
480.0000 ug | PREFILLED_SYRINGE | Freq: Every day | INTRAMUSCULAR | Status: DC
  Administered 2024-12-02 – 2024-12-05 (×4): 480 ug via SUBCUTANEOUS
  Filled 2024-12-02 (×6): qty 0.8

## 2024-12-02 NOTE — Plan of Care (Signed)
  Problem: Education: Goal: Knowledge of General Education information will improve Description: Including pain rating scale, medication(s)/side effects and non-pharmacologic comfort measures Outcome: Progressing   Problem: Clinical Measurements: Goal: Respiratory complications will improve Outcome: Progressing   Problem: Activity: Goal: Risk for activity intolerance will decrease Outcome: Progressing   Problem: Pain Managment: Goal: General experience of comfort will improve and/or be controlled Outcome: Progressing   Problem: Safety: Goal: Ability to remain free from injury will improve Outcome: Progressing

## 2024-12-02 NOTE — Progress Notes (Signed)
 Physical Therapy Treatment Patient Details Name: Tara Hanna MRN: [id number] DOB: 27-Aug-1976 Today's Date: 12/02/2024   History of Present Illness Tara Hanna is a 48yoF who [facility] for acute onset SOB, ABD pain, tachypnea. PMH: HTN, metastatic breast cancer with metastasis to liver, brain and bone currently on palliative chemotherapy. RVP (-) COVID, flu, RSV. Workup revealing of progression of metastatic disease process. MRI brain was obtained revealing an acute embolic strokes.    PT Comments  Pt continues to have fluctuating and mild desaturation on room air, 82-90% SpO2, continuous sensor needs an update. Pt able to perform 10x STS with RW, no LOB, feels generally good, but O2 desaturation thereafter x2 minutes. Pt increased to 5L for alternate step training at bedside, HR remains in 130s, takes a seated recovery after feeling SOB, sats 98%. Pt left sitting at EOB, encouraged upright to maximize lung function. Will continue to follow.    If plan is discharge home, recommend the following: A little help with walking and/or transfers;Assist for transportation;Help with stairs or ramp for entrance   Can travel by private vehicle        Equipment Recommendations  BSC/3in1;Other (comment)    Recommendations for Other Services       Precautions / Restrictions Precautions Precautions: Fall Recall of Precautions/Restrictions: Intact Restrictions Weight Bearing Restrictions Per Provider Order: No     Mobility  Bed Mobility Overal bed mobility: Modified Independent Bed Mobility: Supine to Sit     Supine to sit: Modified independent (Device/Increase time), HOB elevated          Transfers Overall transfer level: Needs assistance Equipment used: Rolling walker (2 wheels) Transfers: Sit to/from Stand Sit to Stand: Supervision                Ambulation/Gait Ambulation/Gait assistance: Contact guard assist, Min assist Gait Distance (Feet): 48  Feet Assistive device: Rolling walker (2 wheels) Gait Pattern/deviations: Step-through pattern       General Gait Details: alternating fwd, backward in room, has 1 LOB while back stepping; )2 increased to 5L due to desaturation after transfers exercise.   Stairs             Wheelchair Mobility     Tilt Bed    Modified Rankin (Stroke Patients Only)       Balance                                            Communication    Cognition Arousal: Alert Behavior During Therapy: WFL for tasks assessed/performed   PT - Cognitive impairments: No apparent impairments                                Cueing    Exercises      General Comments        Pertinent Vitals/Pain Pain Assessment Pain Assessment: No/denies pain    Home Living                          Prior Function            PT Goals (current goals can now be found in the care plan section) Acute Rehab PT Goals Patient Stated Goal: return to home, indpendent mobiilty, work PT Goal Formulation: With patient  Time For Goal Achievement: 12/14/24 Potential to Achieve Goals: Fair Progress towards PT goals: Progressing toward goals    Frequency    Min 2X/week      PT Plan      Co-evaluation              AM-PAC PT 6 Clicks Mobility   Outcome Measure  Help needed turning from your back to your side while in a flat bed without using bedrails?: None Help needed moving from lying on your back to sitting on the side of a flat bed without using bedrails?: A Little Help needed moving to and from a bed to a chair (including a wheelchair)?: A Little Help needed standing up from a chair using your arms (e.g., wheelchair or bedside chair)?: A Little Help needed to walk in hospital room?: A Lot Help needed climbing 3-5 steps with a railing? : A Lot 6 Click Score: 17    End of Session Equipment Utilized During Treatment: Oxygen Activity Tolerance: No  increased pain;Patient tolerated treatment well Patient left: in bed;with family/visitor present;with call bell/phone within reach Nurse Communication: Mobility status PT Visit Diagnosis: Other abnormalities of gait and mobility (R26.89);Muscle weakness (generalized) (M62.81);Difficulty in walking, not elsewhere classified (R26.2)     Time: 9064-9041 PT Time Calculation (min) (ACUTE ONLY): 23 min  Charges:    $Therapeutic Activity: 23-37 mins PT General Charges $$ ACUTE PT VISIT: 1 Visit                    11:08 AM, 12/02/2024 Peggye JAYSON Linear, PT, DPT Physical Therapist - [facility]  [phone] (ASCOM)    Alizah Sills C 12/02/2024, 11:05 AM

## 2024-12-02 NOTE — Progress Notes (Signed)
 "    Palliative Medicine [facility] at [facility] [phone] [phone]   Name: Tara Hanna Date: 12/02/2024 MRN: [id number]  DOB: 06-Sep-1976  Patient Care Team: System, Provider Not In as PCP - General Georgina Shasta POUR, RN as Oncology Nurse Navigator Rennie Cindy SAUNDERS, MD as Consulting Physician (Oncology)    REASON FOR CONSULTATION: Tara Hanna is a 49 y.o. 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  Patient most recently treated with cycle 1 Doxil  on 11/22/2024.  She was subsequently hospitalized with abnormal LFTs and sepsis from pneumonia.  Palliative care was consulted to address goals. .   CODE STATUS: Full code  PAST MEDICAL HISTORY: Past Medical History:  Diagnosis Date   Breast cancer (HCC)    Fibroids    Hypertension     PAST SURGICAL HISTORY:  Past Surgical History:  Procedure Laterality Date   ABDOMINAL HYSTERECTOMY     BREAST LUMPECTOMY Left 2024    HEMATOLOGY/ONCOLOGY HISTORY:  Oncology History Overview Note  Stage IIIB Left breast IDC (cT2, N1, Mx, G3, ER 2%, PR- , HER2-); functionally TNBC -S/p PRAD study, involving a single dose of pembrolizumab  with radiation prior to starting standard chemo plus immunotherapy. She was randomized to no upfront RT boost. She has had multiple delays in her chemotherapy treatment for various reasons. Including infection and cytopenias. She completed last dose on 10/27/2022.   She is s/p left breast lumpectomy and axillary LN dissection of axillary staging on 11/27/2022 with 9 of 28 lymph nodes positive for macro metastatic carcinoma with the size of the largest tumor deposit 33 mm, treatment effect identified in multiple lymph nodes, extensive lymphovascular space invasion, and extranodal extension present less than 2 mm. Residual left breast invasive ductal carcinoma  with tumor size 64 mm in greatest dimension ER+(2%), PR -, HER2- (IHC 1+). She has Residual Cancer Burden Class: RCB-III   Adjuvant systemic therapy: -Previously discussed option of enrolling in the ASCENT-05/OptimICE-RD (AFT-65): Phase 3, randomized, open-label study of adjuvant sacituzumab govitecan + pembrolizumab  (pembro) vs pembro  capecitabine in patients with triple-negative breast cancer and residual disease. After meeting with study coordinator she declined.  -Proceeded with SOC using Capecitabine while continuing Pembrolizumab  every 6 weeks (last cycle on 11/26/2022)  # Recurrent TNBC Metastatic breast cancer  1st line-BRE18-360 Phase I/II Study included Olapraib and Durvalumab 2nd line-Sacituzumab  3rd line- Enhertu  Clinical trials: [id number]. TRADE-DXD not eligible since previous Saci. She is not eligibile for UARMR941 given brain mets.  She was completing her last cycle of capecitabine when she unfortunately presented with seizure like symptoms (Facial twitching-10/2023). Brain MRI showed 2 enhancing intracranial masses measuring up to 1.8cm in the right frontal lobe and 1.2cm in the posterior right parietal lobe with surrounding vasogenic edema. We obtained restaging at the time of discovering CNS progression; with no radiographic evidence of systemic disease. She enrolled in the BRE18-360 Phase I/II Study of Stereotactic Radiosurgery with Concurrent Administration of DNA Damage Response (DDR) Inhibitor (Olaparib) Followed by Adjuvant Combination of Durvalumab ([id number]) and Physician's Choice Systemic Therapy in Subjects with Breast Cancer Brain Metastases. Patient consented and started treatment 11/20/23. Olaparib chosen for systemic therapy given absence of systemic disease at the time.  She completed Cyber knife to the brain on 12/03/23. She remained on study for approximately 6 months before noted progression.  -May 2025 POD new pulmonary nodules with mediastinal and bilateral  hilar  lymphadenopathy.There was also indeterminate foci along the hepatic dome. We discussed obtaining biopsy of distant site of disease since she initially presented with no extracranial disease. EBUS and mediastinal LN FNA was completed on 05/04/2024 confirming metastatic TNBC. Sample sent for tempus testing.We proceeded with SOC sacituzumab govitecan at this time  -August 2025. She is s/p C4 she is tolerating okay; although she reports significant fatigue a few days following treatment each week. She is concern about the dosing and inquires if a dose reduction is possible. She describes grade 2/3 fatigue interfering with daily activities. We discussed DR in effort to improve tolerance. Of note She did have tx hold last cycle due to neutropenia, we have add GSCF support Day 10 patient self injects.   S/p Sacituzumab 7.5 mg/kg Days 1,8 every 21 days ( DR starting C4) and Pegfilgrastim  6 mg on day 10  -September 2025. She had progressive disease with new bone lesions on 08/02/24 and a pathological fracture.Reports weight gain and new lower extremity swelling while on Sacituzumab. Voiced that she would like to proceed with standard of care treatment as opposed to clinical trials. We discussed starting Enhertu and after discussing general side effects, she agreed. We weill have our pharmacist reach out for formal chemo education. Her father had just passed away this weekend and is arranging the funeral; she would like to resume treatment in 2 weeks once she returns. - 08/19/2024 C1D1 Enhertu with Zometa. Here to begin treatment. Labs are adequate for therapy. Will proceed with first cycle chemotherapy with current dose and schedule as planned. Reviewed antiemetic plan. -09/30/2024 C3D1 Enhertu. Overall tolerated previous cycle of Enhertu. Labs appropriate for treatment today. We discussed completing upcoming CT CAP + bone scan to assess response prior to C4.  -10/11/24 MRI brain: New enhancing lesions within the right  cerebellar hemisphere and left occipital lobe. Right frontal subcortical lesion measuring 1.3 x 0.7 cm with minimal surrounding edema, previously 1.4 x 0.9 cm. Interval size increase of a right parieto-occipital enhancing lesion (POD vs post-radiation changes)  -10/18/24 CT A/P: Numerous new ill-defined hypoattenuating foci are seen scattered throughout the hepatic parenchyma, which are worrisome for potential new sites of metastasis. New soft tissue nodule within the subcutaneous tissues along the left lateral chest wall, which measures up to 1.3 cm in greatest axial dimension, also concerning for new metastases. -10/25/24: C4 Enhertu cancelled given POD on scans. Discussed 4th line liposomal doxorubicin .   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 -10/11/24 MRI brain showed new enhancing lesions within the right cerebellar hemisphere and left occipital lobe and Interval size increase of a right parieto-occipital enhancing lesion (unclear if POD vs post-radiation changes)  -started dexamethasone  4 mg daily given vasogenic edema seen on 09/2024 brain MRI. -Continue to follow Neurology. She was switched back to Keppra  per Neuro.     Cancer of right breast metastatic to brain [facility])  11/08/2024 Initial Diagnosis   Cancer of right breast metastatic to brain [facility])   11/22/2024 -  Chemotherapy   Patient is on Treatment Plan : BREAST Liposomal Doxorubicin  (50) q28d       ALLERGIES:  has no known allergies.  MEDICATIONS:  Current Facility-Administered Medications  Medication Dose Route Frequency Provider Last Rate Last Admin   0.9 %  sodium chloride  infusion (Manually program via Guardrails IV Fluids)   Intravenous Once Tariq, Hassan, MD  acetaminophen  (TYLENOL ) tablet 650 mg  650 mg Oral Q6H PRN Fernand Prost, MD   650 mg at 12/02/24 9357   Or   acetaminophen  (TYLENOL ) suppository 650 mg  650 mg Rectal Q6H  PRN Khan, Ghalib, MD       ALPRAZolam  (XANAX ) tablet 0.25 mg  0.25 mg Oral BID PRN Cosette Blackwater, MD       Chlorhexidine  Gluconate Cloth 2 % PADS 6 each  6 each Topical Daily Fernand Prost, MD   6 each at 12/02/24 0848   feeding supplement (ENSURE PLUS HIGH PROTEIN) liquid 237 mL  237 mL Oral BID BM Jens Durand, MD   237 mL at 12/02/24 0847   filgrastim -aafi (NIVESTYM ) injection 480 mcg  480 mcg Subcutaneous Q2000 Brahmanday, Govinda R, MD       guaiFENesin -dextromethorphan  (ROBITUSSIN DM) 100-10 MG/5ML syrup 5 mL  5 mL Oral Q4H PRN Tariq, Hassan, MD   5 mL at 12/01/24 0425   HYDROmorphone  (DILAUDID ) injection 0.5 mg  0.5 mg Intravenous Q3H PRN Khan, Ghalib, MD   0.5 mg at 11/27/24 0040   HYDROmorphone  (DILAUDID ) tablet 2 mg  2 mg Oral Q4H PRN Khan, Ghalib, MD   2 mg at 12/02/24 0550   ipratropium-albuterol  (DUONEB) 0.5-2.5 (3) MG/3ML nebulizer solution 3 mL  3 mL Nebulization Q6H PRN Fernand Prost, MD       levETIRAcetam  (KEPPRA ) tablet 500 mg  500 mg Oral BID Jens Durand, MD   500 mg at 12/02/24 0847   ondansetron  (ZOFRAN ) tablet 4 mg  4 mg Oral Q6H PRN Khan, Ghalib, MD   4 mg at 11/30/24 1249   Or   ondansetron  (ZOFRAN ) injection 4 mg  4 mg Intravenous Q6H PRN Khan, Ghalib, MD       Oral care mouth rinse  15 mL Mouth Rinse PRN Jens Durand, MD       senna-docusate (Senokot-S) tablet 1 tablet  1 tablet Oral QHS PRN Fernand Prost, MD       sodium chloride  flush (NS) 0.9 % injection 10-40 mL  10-40 mL Intracatheter Q12H Fernand Prost, MD   10 mL at 12/02/24 0848   sodium chloride  flush (NS) 0.9 % injection 10-40 mL  10-40 mL Intracatheter PRN Fernand Prost, MD        VITAL SIGNS: BP 100/75 (BP Location: Right Arm)   Pulse (!) 120   Temp 97.6 F (36.4 C)   Resp 16   Ht 5' 4 (1.626 m)   Wt 229 lb 4.5 oz (104 kg)   SpO2 96%   BMI 39.36 kg/m  Filed Weights   11/30/24 0500 12/01/24 0500 12/02/24 0500  Weight: 222 lb 10.6 oz (101 kg) 220 lb 7.4 oz (100 kg) 229 lb 4.5 oz (104 kg)     Estimated body mass index is 39.36 kg/m as calculated from the following:   Height as of this encounter: 5' 4 (1.626 m).   Weight as of this encounter: 229 lb 4.5 oz (104 kg).  LABS: CBC:    Component Value Date/Time   WBC 1.5 (L) 12/02/2024 0449   HGB 8.0 (L) 12/02/2024 0449   HGB 11.1 (L) 11/22/2024 0815   HCT 24.2 (L) 12/02/2024 0449   PLT 11 (LL) 12/02/2024 0449   PLT 133 (L) 11/22/2024 0815   MCV 71.6 (L) 12/02/2024 0449   NEUTROABS 2.1 12/01/2024 0412   LYMPHSABS 0.3 (L) 12/01/2024 0412   MONOABS 0.0 (L) 12/01/2024 0412   EOSABS 0.0 12/01/2024 0412   BASOSABS 0.0  12/01/2024 0412   Comprehensive Metabolic Panel:    Component Value Date/Time   NA 137 12/02/2024 0449   K 5.0 12/02/2024 0449   CL 98 12/02/2024 0449   CO2 22 12/02/2024 0449   BUN 7 12/02/2024 0449   CREATININE 0.67 12/02/2024 0449   CREATININE 0.70 11/22/2024 0815   GLUCOSE 102 (H) 12/02/2024 0449   CALCIUM 8.2 (L) 12/02/2024 0449   AST 663 (H) 12/02/2024 0449   AST 180 (HH) 11/22/2024 0815   ALT 341 (H) 12/02/2024 0449   ALT 302 (HH) 11/22/2024 0815   ALKPHOS 467 (H) 12/02/2024 0449   BILITOT 1.5 (H) 12/02/2024 0449   BILITOT 0.7 11/22/2024 0815   PROT 5.4 (L) 12/02/2024 0449   ALBUMIN 2.5 (L) 12/02/2024 0449    RADIOGRAPHIC STUDIES: ECHOCARDIOGRAM COMPLETE BUBBLE STUDY Result Date: 12/02/2024    ECHOCARDIOGRAM REPORT   Patient Name:   AMENAH TUCCI Date of Exam: 12/02/2024 Medical Rec #:  [id number]       Height:       64.0 in Accession #:    [id number]      Weight:       229.3 lb Date of Birth:  Aug 28, 1976      BSA:          2.073 m Patient Age:    48 years        BP:           115/80 mmHg Patient Gender: F               HR:           122 bpm. Exam Location:  [facility] Procedure: 2D Echo, Cardiac Doppler, Color Doppler and Saline Contrast Bubble            Study (Both Spectral and Color Flow Doppler were utilized during            procedure). Indications:     Stroke  History:         Patient has prior  history of Echocardiogram examinations, most                  recent 11/16/2024. Risk Factors:Hypertension. Breast Cancer.  Sonographer:     Philomena Daring Referring Phys:  [id number] NORVAL BAR Diagnosing Phys: Caron Poser IMPRESSIONS  1. Left ventricular ejection fraction, by estimation, is 50 to 55%. The left ventricle has low normal function. Left ventricular endocardial border not optimally defined to evaluate regional wall motion. There is mild left ventricular hypertrophy. Indeterminate diastolic filling due to E-A fusion.  2. Right ventricular systolic function is normal. The right ventricular size is normal. There is severely elevated pulmonary artery systolic pressure. The estimated right ventricular systolic pressure is 60.5 mmHg.  3. The mitral valve is grossly normal. Trivial mitral valve regurgitation. No evidence of mitral stenosis.  4. Tricuspid valve regurgitation is moderate.  5. The aortic valve has an indeterminant number of cusps. Aortic valve regurgitation is not visualized. No aortic stenosis is present.  6. The inferior vena cava is normal in size with greater than 50% respiratory variability, suggesting right atrial pressure of 3 mmHg.  7. Agitated saline contrast bubble study was positive with shunting observed within 3-6 cardiac cycles suggestive of interatrial shunt. Comparison(s): A prior study was performed on 11/16/2024. No significant change from prior study. FINDINGS  Left Ventricle: Left ventricular ejection fraction, by estimation, is 50 to 55%. The left ventricle has low normal function. Left ventricular endocardial border not optimally  defined to evaluate regional wall motion. The left ventricular internal cavity  size was normal in size. There is mild left ventricular hypertrophy. Indeterminate diastolic filling due to E-A fusion. Right Ventricle: The right ventricular size is normal. Right vetricular wall thickness was not well visualized. Right ventricular systolic function is  normal. There is severely elevated pulmonary artery systolic pressure. The tricuspid regurgitant velocity is 3.79 m/s, and with an assumed right atrial pressure of 3 mmHg, the estimated right ventricular systolic pressure is 60.5 mmHg. Left Atrium: Left atrial size was normal in size. Right Atrium: Right atrial size was normal in size. Pericardium: There is no evidence of pericardial effusion. Presence of epicardial fat layer. Mitral Valve: The mitral valve is grossly normal. Trivial mitral valve regurgitation. No evidence of mitral valve stenosis. Tricuspid Valve: The tricuspid valve is normal in structure. Tricuspid valve regurgitation is moderate . No evidence of tricuspid stenosis. Aortic Valve: The aortic valve has an indeterminant number of cusps. Aortic valve regurgitation is not visualized. No aortic stenosis is present. Pulmonic Valve: The pulmonic valve was not well visualized. Pulmonic valve regurgitation is mild. No evidence of pulmonic stenosis. Aorta: The aortic root and ascending aorta are structurally normal, with no evidence of dilitation. Venous: The inferior vena cava is normal in size with greater than 50% respiratory variability, suggesting right atrial pressure of 3 mmHg. IAS/Shunts: The interatrial septum was not well visualized. Agitated saline contrast was given intravenously to evaluate for intracardiac shunting. Agitated saline contrast bubble study was positive with shunting observed within 3-6 cardiac cycles suggestive of interatrial shunt.  LEFT VENTRICLE PLAX 2D LVIDd:         2.90 cm   Diastology LVIDs:         2.10 cm   LV e' medial:    5.98 cm/s LV PW:         1.00 cm   LV E/e' medial:  9.3 LV IVS:        1.20 cm   LV e' lateral:   9.36 cm/s LVOT diam:     2.30 cm   LV E/e' lateral: 5.9 LV SV:         39 LV SV Index:   19 LVOT Area:     4.15 cm  RIGHT VENTRICLE RV S prime:     14.30 cm/s TAPSE (M-mode): 1.5 cm LEFT ATRIUM             Index       RIGHT ATRIUM          Index LA diam:         2.30 cm 1.11 cm/m  RA Area:     9.59 cm LA Vol (A2C):   16.8 ml 8.10 ml/m  RA Volume:   16.30 ml 7.86 ml/m LA Vol (A4C):   18.5 ml 8.92 ml/m LA Biplane Vol: 17.9 ml 8.63 ml/m  AORTIC VALVE LVOT Vmax:   78.30 cm/s LVOT Vmean:  53.700 cm/s LVOT VTI:    0.095 m  AORTA Ao Root diam: 2.70 cm MITRAL VALVE                TRICUSPID VALVE MV Area (PHT): 5.75 cm     TR Peak grad:   57.5 mmHg MV Decel Time: 132 msec     TR Vmax:        379.00 cm/s MV E velocity: 55.60 cm/s MV A velocity: 101.00 cm/s  SHUNTS MV E/A ratio:  0.55  Systemic VTI:  0.10 m                             Systemic Diam: 2.30 cm Caron Poser Electronically signed by Caron Poser Signature Date/Time: 12/02/2024/11:38:17 AM    Final    US  Venous Img Upper Bilat (DVT) Result Date: 12/01/2024 CLINICAL DATA:  Shortness of breath.  Metastatic breast cancer. EXAM: BILATERAL UPPER EXTREMITY VENOUS DOPPLER ULTRASOUND TECHNIQUE: Gray-scale sonography with graded compression, as well as color Doppler and duplex ultrasound were performed to evaluate the bilateral upper extremity deep venous systems from the level of the subclavian vein and including the jugular, axillary, basilic, radial, ulnar and upper cephalic vein. Spectral Doppler was utilized to evaluate flow at rest and with distal augmentation maneuvers. COMPARISON:  None Available. FINDINGS: RIGHT UPPER EXTREMITY Internal Jugular Vein: No evidence of thrombus. Normal compressibility, respiratory phasicity and color Doppler flow. Subclavian Vein: No evidence of thrombus. Normal color Doppler flow and phasicity. Axillary Vein: No evidence of thrombus. Normal compressibility, respiratory phasicity and response to augmentation. Cephalic Vein: Not visualized. Basilic Vein: No evidence of thrombus. Normal compressibility, respiratory phasicity and response to augmentation. Brachial Veins: No evidence of thrombus. Normal compressibility, respiratory phasicity and response to augmentation.  Radial Veins: No evidence of thrombus. Normal compressibility and color Doppler flow. Ulnar Veins: No evidence of thrombus. Normal compressibility and color Doppler flow. Other Findings:  None. LEFT UPPER EXTREMITY Internal Jugular Vein: No evidence of thrombus. Normal compressibility, respiratory phasicity and color Doppler flow. Subclavian Vein: No evidence of thrombus. Normal color Doppler flow and phasicity. Axillary Vein: No evidence of thrombus. Normal compressibility, respiratory phasicity and response to augmentation. Cephalic Vein: No evidence of thrombus. Normal compressibility, respiratory phasicity and response to augmentation. Basilic Vein: No evidence of thrombus. Normal compressibility, respiratory phasicity and response to augmentation. Brachial Veins: No evidence of thrombus. Normal compressibility, respiratory phasicity and response to augmentation. Radial Veins: No evidence of thrombus. Normal compressibility and color Doppler flow. Ulnar Veins: No evidence of thrombus. Normal compressibility and color Doppler flow. Other Findings:  None. IMPRESSION: No evidence of DVT within either upper extremity. Electronically Signed   By: Juliene Balder M.D.   On: 12/01/2024 09:52   US  Venous Img Lower Bilateral (DVT) Result Date: 12/01/2024 CLINICAL DATA:  Evaluate for deep venous thrombosis. Metastatic breast cancer. Acute hypoxic respiratory failure. EXAM: BILATERAL LOWER EXTREMITY VENOUS DOPPLER ULTRASOUND TECHNIQUE: Gray-scale sonography with graded compression, as well as color Doppler and duplex ultrasound were performed to evaluate the lower extremity deep venous systems from the level of the common femoral vein and including the common femoral, femoral, profunda femoral, popliteal and calf veins including the posterior tibial, peroneal and gastrocnemius veins when visible. Spectral Doppler was utilized to evaluate flow at rest and with distal augmentation maneuvers in the common femoral, femoral and  popliteal veins. COMPARISON:  None Available. FINDINGS: RIGHT LOWER EXTREMITY Common Femoral Vein: No evidence of thrombus. Normal compressibility, respiratory phasicity and response to augmentation. Saphenofemoral Junction: No evidence of thrombus. Normal compressibility and flow on color Doppler imaging. Profunda Femoral Vein: No evidence of thrombus. Normal compressibility and flow on color Doppler imaging. Femoral Vein: No evidence of thrombus. Normal compressibility, respiratory phasicity and response to augmentation. Popliteal Vein: No evidence of thrombus. Normal compressibility, respiratory phasicity and response to augmentation. Calf Veins: No evidence of thrombus. Normal compressibility and flow on color Doppler imaging. Other Findings:  None. LEFT LOWER EXTREMITY Common Femoral Vein:  No evidence of thrombus. Normal compressibility, respiratory phasicity and response to augmentation. Saphenofemoral Junction: No evidence of thrombus. Normal compressibility and flow on color Doppler imaging. Profunda Femoral Vein: No evidence of thrombus. Normal compressibility and flow on color Doppler imaging. Femoral Vein: No evidence of thrombus. Normal compressibility, respiratory phasicity and response to augmentation. Popliteal Vein: No evidence of thrombus. Normal compressibility, respiratory phasicity and response to augmentation. Calf Veins: No evidence of thrombus. Normal compressibility and flow on color Doppler imaging. Other Findings:  None. IMPRESSION: No evidence of deep venous thrombosis in either lower extremity. Electronically Signed   By: Juliene Balder M.D.   On: 12/01/2024 09:47   CT ANGIO HEAD NECK W WO CM Result Date: 11/30/2024 EXAM: CT HEAD WITHOUT CTA HEAD AND NECK WITH 11/30/2024 10:04:02 AM TECHNIQUE: CTA of the head and neck was performed with the administration of 75 mL of iohexol  (OMNIPAQUE ) 350 MG/ML injection. Noncontrast CT of the head with reconstructed 2-D images are also provided for  review. Multiplanar 2D and/or 3D reformatted images are provided for review. Automated exposure control, iterative reconstruction, and/or weight based adjustment of the mA/kV was utilized to reduce the radiation dose to as low as reasonably achievable. COMPARISON: MRI head 11/28/2024. CLINICAL HISTORY: Stroke/TIA, determine embolic source. Metastatic breast cancer. FINDINGS: CT HEAD: BRAIN AND VENTRICLES: The small acute right cerebellar and left cerebral infarcts on MRI are largely occult by CT. There is a 3 mm hypodensity in the left thalamus suggestive of a lacunar infarct without a corresponding abnormality on the recent MRI. Calcification and mild surrounding edema or gliosis are noted in the right frontal and right parietal lobes at the sites of known metastases. No acute intracranial hemorrhage, midline shift, hydrocephalus, or extra-axial fluid collection is identified. Cerebral volume is normal. ORBITS: Abnormal extraconal soft tissue in the superolateral aspect of the left orbit anteriorly with extension into the preseptal soft tissues and with this abnormal soft tissue spanning approximately 3 cm in AP dimension. SINUSES AND MASTOIDS: No acute abnormality. CTA NECK: AORTIC ARCH AND ARCH VESSELS: Normal variant aortic arch branching pattern with common origin of the brachiocephalic and left common carotid arteries. No dissection or arterial injury. No significant stenosis of the brachiocephalic or subclavian arteries. CERVICAL CAROTID ARTERIES: Partially retropharyngeal course of both proximal internal carotid arteries. No dissection, arterial injury, or hemodynamically significant stenosis by NASCET criteria. CERVICAL VERTEBRAL ARTERIES: Codominant vertebral arteries. No dissection, arterial injury, or significant stenosis. LUNGS AND MEDIASTINUM: Partially visualized mediastinal lymphadenopathy and left upper lobe consolidation, more fully evaluated on a recent CT chest from 11/26/2024. SOFT TISSUES:  Partially visualized right jugular port a cath. BONES: Dental caries. Scattered small lucent spine lesions, for example involving the C4, C5, and C7 vertebral bodies potentially reflecting metastases. Skull lesions on MRI are not clearly visible by CT. CTA HEAD: ANTERIOR CIRCULATION: The intracranial internal carotid arteries are widely patent. ACAs and MCAs are patent without evidence of a proximal branch occlusion or significant proximal stenosis. No aneurysm. POSTERIOR CIRCULATION: The intracranial vertebral arteries are widely patent to the basilar. Patent PICA and SCA origins are visualized bilaterally. The basilar artery is widely patent. There are large posterior communicating arteries and hypoplastic P1 segments bilaterally. Both PCAs are patent without evidence of a significant proximal stenosis. No aneurysm. OTHER: No dural venous sinus thrombosis on this non-dedicated study. IMPRESSION: 1. 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 2. No large vessel occlusion no significant stenosis in the head or neck. 3. Known metastatic breast cancer including brain and osseous metastases. Abnormal left orbital soft tissue is also suspicious for metastasis. Electronically signed by: Dasie Hamburg MD 11/30/2024 10:37 AM EST RP Workstation: [id number]   MR BRAIN W WO CONTRAST Result Date: 11/29/2024 EXAM: MRI BRAIN WITH AND WITHOUT CONTRAST 11/28/2024 02:20:35 PM TECHNIQUE: Multiplanar multisequence MRI of the head/brain was performed with and without the administration of intravenous contrast. CONTRAST: 10 mL gadobutrol  (GADAVIST ) 1 MMOL/ML injection 10 mL GADOBUTROL  1 MMOL/ML IV SOLN. COMPARISON: MR Head 11/02/2023, outside brain MRI 11/08/2024. CLINICAL HISTORY: 49 year old female with breast cancer, metastatic disease evaluation. Metastatic disease on CT chest, abdomen, and pelvis recently.  Known brain metastases on MRI last year. FINDINGS: BRAIN AND VENTRICLES: No acute intracranial hemorrhage. No mass effect or midline shift. No hydrocephalus. The sella is unremarkable. Normal flow voids. The dural venous sinuses are enhancing and appear patent. nonenhancing scattered foci of restricted diffusion which are generally subcentimeter in both the right cerebellar hemisphere (series 10 images 13 through 16), in the medial left thalamus on series 10 image 31, and scattered in the posterior superior left frontal lobe on series 10 image 45 minimal associated T2 and FLAIR hyperintensity. On SWI mild chronic hemosiderin associated with the metastatic lesions is stable. No acute intracranial hemorrhage or mass effect. Multiple Brain Lesions: 1. Small nodular and solid enhancing 6 mm lesion right inferior cerebellum on series 20 image 37 appears slightly larger on both axial and coronal postcontrast images, up to 6 mm now versus 4 to 5 mm last month. No regional edema or mass effect. 2. Nearby 2 additional punctate enhancing bilateral inferior cerebellar metastases on all 3 postcontrast imaging sequences (series 20 images 35 and 39), and the lesion on image 35 in the right hemisphere appears increased from last month. 3. Posterior right hemisphere partially solid and nodular enhancing metastases on series 20 image 103 measuring 18 mm long axis appears stable. Regional T2 and FLAIR hyperintensity also stable and no significant regional mass effect. 4. Posterior right frontal operculum 13 mm enhancing nodular, partially solid metastasis appears stable on series 20 image 116. Mild regional T2 and FLAIR hyperintensity appears stable without mass effect. No convincing leptomeningeal enhancement. Small developmental venous anomaly (normal variant) suspected in the left occipital lobe on series 20 image 87 and stable from 2024. Borderline to mild asymmetric right hemisphere dural pachymeningeal thickening also noted  (series 20 image 102), and appears new since 2024 but not significantly changed from last month. No areas of pachymeningeal nodularity. No convincing leptomeningeal enhancement. ORBITS: No acute abnormality. SINUSES: No acute abnormality. BONES AND SOFT TISSUES: Progressive bone marrow signal heterogeneity since 2024 compatible with widespread osseous metastatic disease. No destructive osseous lesion identified. Numerous small nonspecific FLAIR hyperintense scalp nodules which are mostly new since 2024, not significantly changed from last month and nonspecific. Grossly negative visible spinal cord. IMPRESSION: 1. 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 No associated acute hemorrhage or mass effect. 2. Superimposed Five enhancing intracranial metastases, with slight interval progression in the right cerebellum since outside MRI last month. Subtle right hemisphere pachymeningeal thickening and enhancement also appears increased since 2024, with underlying diffuse osseous metastatic disease Electronically signed by: Helayne Hurst MD 11/29/2024 04:22 AM EST RP Workstation: [id number]   CT Angio Chest Pulmonary Embolism (PE) W or WO Contrast Result Date: 11/26/2024 EXAM: CTA  CHEST 11/26/2024 11:37:29 PM TECHNIQUE: CTA of the chest was performed without and with the administration of 75 mL of iohexol  (OMNIPAQUE ) 350 MG/ML injection. Multiplanar reformatted images are provided for review. MIP images are provided for review. Automated exposure control, iterative reconstruction, and/or weight based adjustment of the mA/kV was utilized to reduce the radiation dose to as low as reasonably achievable. COMPARISON: 11/26/2024 CLINICAL HISTORY: Pulmonary embolism (PE) suspected, high prob. FINDINGS: PULMONARY ARTERIES: Pulmonary arteries are adequately opacified for evaluation. No acute pulmonary embolus. Main pulmonary artery is normal in  caliber. MEDIASTINUM: The heart and pericardium demonstrate no acute abnormality. There is no acute abnormality of the thoracic aorta. LYMPH NODES: Bilateral hilar and mediastinal adenopathy again noted, unchanged. LUNGS AND PLEURA: Left upper lobe nodular airspace disease as well as several small pulmonary nodules again noted, unchanged. Bibasilar linear scarring or atelectasis. No evidence of pleural effusion or pneumothorax. UPPER ABDOMEN: Numerous hepatic metastases, better seen on earlier CT. 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 No acute soft tissue abnormality. IMPRESSION: 1. No evidence of pulmonary embolism. 2. Stable left upper lobe nodular airspace disease and several small pulmonary nodules. 3. Stable bilateral hilar and mediastinal adenopathy. 4. 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 5. Numerous hepatic metastases, better seen on earlier CT. Electronically signed by: Franky Crease MD 11/26/2024 11:45 PM EST RP Workstation: [id number]   CT CHEST ABDOMEN PELVIS W CONTRAST Result Date: 11/26/2024 CLINICAL DATA:  Sepsis. Breast cancer with history of liver and spinal metastasis. EXAM: CT CHEST, ABDOMEN, AND PELVIS WITH CONTRAST TECHNIQUE: Multidetector CT imaging of the chest, abdomen and pelvis was performed following the standard protocol during bolus administration of intravenous contrast. RADIATION DOSE REDUCTION: This exam was performed according to the departmental dose-optimization program which includes automated exposure control, adjustment of the mA and/or kV according to patient size and/or use of iterative reconstruction technique. CONTRAST:  OMNIPAQUE  IOHEXOL  300 MG/ML  SOLN COMPARISON:  10/18/2024, 04/30/2008. FINDINGS: CT CHEST  FINDINGS Cardiovascular: The heart is normal in size and there is no pericardial effusion. The distal tip of a right chest port terminates at the cavoatrial junction. The aorta and pulmonary trunk are normal in caliber. Mediastinum/Nodes: Enlarged lymph nodes are present in the mediastinum measuring up to 1.9 cm in the precarinal space. There are prominent lymph nodes in the hilar regions bilaterally. Lungs/Pleura: Paraseptal and centrilobular emphysematous changes are noted in the lungs. There are scattered pulmonary nodules bilaterally, increased in size and number from the prior exam. For example there is an 8 mm nodule in the posterior segment of the left upper lobe, axial image 37, new from the previous exam. Subpleural consolidation is noted in the anterior aspect of the left upper lobe with communication of soft tissue fullness at the left hilum and, increased from the prior exam. Strandy opacities are noted at the lung bases, possible atelectasis, scarring, or infiltrate. No effusion or pneumothorax is seen. Musculoskeletal: Enlarged lymph nodes are noted in the left chest wall. Surgical clips in a small fluid collection is present in the left axilla measuring 2.6 cm, possible seroma and unchanged from the previous exam. Sclerotic lesions are noted in the thoracic spine, compatible with known metastatic disease. There are mild compression deformities in the superior endplates at T9 and T11, likely  present on the prior exam. CT ABDOMEN PELVIS FINDINGS Hepatobiliary: Innumerable hypodense masses are present in the liver, the largest in the left lobe measuring 6.3 cm and increased from the prior exam. No biliary ductal dilatation. The gallbladder is without stones. There suggestion of gallbladder wall thickening, which may be related to local inflammatory changes in the liver. Pancreas: Unremarkable. No pancreatic ductal dilatation or surrounding inflammatory changes. Spleen: Normal in size without focal  abnormality. Adrenals/Urinary Tract: The adrenal glands are stable. The kidneys enhance symmetrically. No renal calculus or hydronephrosis. The bladder is unremarkable. Stomach/Bowel: There is a small hiatal hernia. The stomach is otherwise within normal limits. No bowel obstruction, free air, or pneumatosis is seen. Appendix appears normal. Vascular/Lymphatic: No significant vascular findings are present. Enlarged lymph nodes are seen in the gastrohepatic ligament and porta hepatis. Reproductive: Status post hysterectomy. No adnexal masses. Other: No abdominopelvic ascites. A fat containing periumbilical hernia is present. Musculoskeletal: Degenerative changes are present in the lumbar spine. A sclerotic region is present in the proximal sacrum, possible metastatic disease. No acute fracture is seen. IMPRESSION: 1. Left upper lobe consolidation extending to the left hilum with strandy opacities in the lungs bilaterally, possible infiltrate, scarring, or worsening neoplastic process. 2. Interval increase in number and size of hepatic metastasis and pulmonary nodules. 3. Enlargement of mediastinal, hilar, and upper abdominal lymph adenopathy, likely representing metastatic disease. 4. Osteoblastic metastatic disease. Electronically Signed   By: Leita Birmingham M.D.   On: 11/26/2024 15:44   DG Chest Port 1 View if patient is in a treatment room. Result Date: 11/26/2024 EXAM: 1 VIEW(S) XRAY OF THE CHEST 11/26/2024 01:02:00 PM COMPARISON: None available. CLINICAL HISTORY: Suspected Sepsis FINDINGS: LINES, TUBES AND DEVICES: Right chest Port-A-Cath in place with tip projecting over the right atrium approximately 2 cm distal to the superior cavoatrial junction. LUNGS AND PLEURA: Spontaneous densities overlying the bilateral lower lung zones are likely external to the patient. Band-like opacity at right lung base. No pleural effusion. No pneumothorax. HEART AND MEDIASTINUM: No acute abnormality of the cardiac and  mediastinal silhouettes. BONES AND SOFT TISSUES: Left axillary surgical clips noted. No acute osseous abnormality. IMPRESSION: 1. Band-like opacity at the right lung base, favor subsegmental atelectasis. 2. Right chest Port-A-Cath tip projects in the right atrium, approximately 2 cm distal to the superior cavoatrial junction. Electronically signed by: Morgane Naveau MD 11/26/2024 01:10 PM EST RP Workstation: [id number]   ECHOCARDIOGRAM COMPLETE Result Date: 11/16/2024    ECHOCARDIOGRAM REPORT   Patient Name:   NEEMA BARREIRA Date of Exam: 11/16/2024 Medical Rec #:  [id number]       Height:       64.0 in Accession #:    [id number]      Weight:       216.5 lb Date of Birth:  Jan 19, 1976      BSA:          2.023 m Patient Age:    48 years        BP:           159/114 mmHg Patient Gender: F               HR:           112 bpm. Exam Location:  [facility] Procedure: 2D Echo, Cardiac Doppler and Color Doppler (Both Spectral and Color            Flow Doppler were utilized during procedure). Indications:     Chemo Z09  History:  Patient has no prior history of Echocardiogram examinations.  Sonographer:     Rosina Dunk Referring Phys:  [id number] CINDY SAUNDERS [facility] Diagnosing Phys: Caron Poser  Sonographer Comments: Image acquisition challenging due to respiratory motion. IMPRESSIONS  1. Very technically difficult study.  2. Left ventricular ejection fraction, by estimation, is 50 to 55%. Left ventricular ejection fraction by 2D MOD biplane is 51.9 %. The left ventricle has low normal function. Left ventricular endocardial border not optimally defined to evaluate regional wall motion. Indeterminate diastolic filling due to E-A fusion.  3. Right ventricular systolic function is normal. The right ventricular size is normal.  4. The mitral valve is normal in structure. No evidence of mitral valve regurgitation. No evidence of mitral stenosis.  5. The aortic valve has an indeterminant number of cusps. Aortic  valve regurgitation is not visualized. No aortic stenosis is present. Comparison(s): No prior Echocardiogram. FINDINGS  Left Ventricle: Left ventricular ejection fraction, by estimation, is 50 to 55%. Left ventricular ejection fraction by 2D MOD biplane is 51.9 %. The left ventricle has low normal function. Left ventricular endocardial border not optimally defined to evaluate regional wall motion. The left ventricular internal cavity size was normal in size. There is no left ventricular hypertrophy. Indeterminate diastolic filling due to E-A fusion. Right Ventricle: The right ventricular size is normal. Right vetricular wall thickness was not well visualized. Right ventricular systolic function is normal. Left Atrium: Left atrial size was not well visualized. Right Atrium: Right atrial size was not well visualized. Pericardium: There is no evidence of pericardial effusion. Presence of epicardial fat layer. Mitral Valve: The mitral valve is normal in structure. No evidence of mitral valve regurgitation. No evidence of mitral valve stenosis. MV peak gradient, 3.4 mmHg. The mean mitral valve gradient is 1.0 mmHg. Tricuspid Valve: The tricuspid valve is not well visualized. Tricuspid valve regurgitation is not demonstrated. No evidence of tricuspid stenosis. Aortic Valve: The aortic valve has an indeterminant number of cusps. Aortic valve regurgitation is not visualized. No aortic stenosis is present. Aortic valve mean gradient measures 1.0 mmHg. Aortic valve peak gradient measures 1.9 mmHg. Aortic valve area, by VTI measures 5.25 cm. Pulmonic Valve: The pulmonic valve was not well visualized. Pulmonic valve regurgitation is trivial. No evidence of pulmonic stenosis. Aorta: The aortic root and ascending aorta are structurally normal, with no evidence of dilitation. Venous: The inferior vena cava was not well visualized. IAS/Shunts: The interatrial septum was not well visualized.  LEFT VENTRICLE PLAX 2D                         Biplane EF (MOD) LVIDd:         3.60 cm         LV Biplane EF:   Left LVIDs:         2.50 cm                          ventricular LV PW:         1.00 cm                          ejection LV IVS:        0.90 cm                          fraction by LVOT diam:     2.20  cm                          2D MOD LV SV:         38                               biplane is LV SV Index:   19                               51.9 %. LVOT Area:     3.80 cm                                Diastology                                LV e' medial:    10.10 cm/s LV Volumes (MOD)               LV E/e' medial:  4.2 LV vol d, MOD    44.9 ml       LV e' lateral:   7.62 cm/s A2C:                           LV E/e' lateral: 5.6 LV vol d, MOD    41.1 ml A4C: LV vol s, MOD    25.7 ml A2C: LV vol s, MOD    17.9 ml A4C: LV SV MOD A2C:   19.2 ml LV SV MOD A4C:   41.1 ml LV SV MOD BP:    24.2 ml RIGHT VENTRICLE RV Basal diam:  3.30 cm     PULMONARY VEINS RV Mid diam:    2.10 cm     A Reversal Duration: 102.00 msec RV S prime:     11.70 cm/s  A Reversal Velocity: 56.60 cm/s TAPSE (M-mode): 1.6 cm      Diastolic Velocity:  47.80 cm/s                             S/D Velocity:        1.50                             Systolic Velocity:   73.90 cm/s LEFT ATRIUM             Index       RIGHT ATRIUM           Index LA diam:        2.60 cm 1.28 cm/m  RA Area:     11.60 cm LA Vol (A2C):   17.5 ml 8.65 ml/m  RA Volume:   26.80 ml  13.24 ml/m LA Vol (A4C):   15.8 ml 7.81 ml/m LA Biplane Vol: 18.5 ml 9.14 ml/m  AORTIC VALVE                    PULMONIC VALVE AV Area (Vmax):    4.04 cm     PV Vmax:          0.76 m/s AV Area (Vmean):   3.75 cm  PV Vmean:         51.300 cm/s AV Area (VTI):     5.25 cm     PV VTI:           0.114 m AV Vmax:           69.00 cm/s   PV Peak grad:     2.3 mmHg AV Vmean:          45.700 cm/s  PV Mean grad:     1.0 mmHg AV VTI:            0.072 m      PR End Diast Vel: 6.25 msec AV Peak Grad:      1.9 mmHg     RVOT Peak grad:    2 mmHg AV Mean Grad:      1.0 mmHg LVOT Vmax:         73.40 cm/s LVOT Vmean:        45.100 cm/s LVOT VTI:          0.099 m LVOT/AV VTI ratio: 1.38  AORTA Ao Root diam: 3.00 cm Ao Asc diam:  3.20 cm MITRAL VALVE MV Area (PHT): 6.80 cm    SHUNTS MV Area VTI:   2.77 cm    Systemic VTI:  0.10 m MV Peak grad:  3.4 mmHg    Systemic Diam: 2.20 cm MV Mean grad:  1.0 mmHg    Pulmonic VTI:  0.075 m MV Vmax:       0.92 m/s MV Vmean:      50.0 cm/s MV Decel Time: 112 msec MV E velocity: 42.70 cm/s MV A velocity: 91.70 cm/s MV E/A ratio:  0.47 Tefl Teacher signed by Caron Poser Signature Date/Time: 11/16/2024/3:20:31 PM    Final     PERFORMANCE STATUS (ECOG) : 3 - Symptomatic, >50% confined to bed  Review of Systems Unless otherwise noted, a complete review of systems is negative.  Physical Exam General: NAD Pulmonary: Unlabored, on O2 Extremities: no edema, no joint deformities Skin: no rashes Neurological: Weakness but otherwise nonfocal  IMPRESSION: Follow-up visit.   LFTs are worsening.  Persistent pancytopenia.  However, patient says she is asymptomatic and feels improved.  Dr. Rennie and I met with patient, husband, sons, and pastor/father-in-law.  Patient was informed that she is not a candidate for additional chemotherapy.  We discussed her poor prognosis and likelihood that she is nearing end-of-life.  Hospice was recommended.  Ultimately, patient and family reiterated their wish to do anything and everything to prolong life.  They want her to remain a full code/full scope of treatment.  They requested transfer to [facility].  PLAN: - Continue full code/full scope of treatment  Case and plan discussed with Dr. Rennie and Dr. Cosette  Time Total: 30 minutes  Visit consisted of counseling and education dealing with the complex and emotionally intense issues of symptom management and palliative care in the setting of serious and potentially life-threatening  illness.Greater than 50%  of this time was spent counseling and coordinating care related to the above assessment and plan.  Signed by: Fonda Mower, PhD, NP-C   "

## 2024-12-02 NOTE — Progress Notes (Signed)
 [name]   DOB:[birth year]   FM#:[id number]    Subjective: Patient resting comfortably.  Patient is alone..  Notes to have further improvement of shortness of breath.  Denies any worsening neurologic deficits.  Denies any bleeding.  She is accompanied multiple family numbers including son's and husband and father-in-law/pastor  Objective:  Vitals:   12/02/24 0930 12/02/24 1143  BP: 113/82 100/75  Pulse: (!) 120 (!) 120  Resp: 18 16  Temp: 99 F (37.2 C) 97.6 F (36.4 C)  SpO2:  96%     Intake/Output Summary (Last 24 hours) at 12/02/2024 1542 Last data filed at 12/02/2024 0930 Gross per 24 hour  Intake 797.08 ml  Output --  Net 797.08 ml   Noted drooping of the left eyelid chronic  Physical Exam Vitals and nursing note reviewed.  HENT:     Head: Normocephalic and atraumatic.     Mouth/Throat:     Pharynx: Oropharynx is clear.  Eyes:     Extraocular Movements: Extraocular movements intact.     Pupils: Pupils are equal, round, and reactive to light.  Cardiovascular:     Rate and Rhythm: Regular rhythm. Tachycardia present.  Pulmonary:     Comments: Decreased breath sounds bilaterally.  Abdominal:     Palpations: Abdomen is soft.  Musculoskeletal:        General: Normal range of motion.     Cervical back: Normal range of motion.  Skin:    General: Skin is warm.  Neurological:     General: No focal deficit present.     Mental Status: She is alert and oriented to person, place, and time.  Psychiatric:        Behavior: Behavior normal.        Judgment: Judgment normal.      Labs:  Lab Results  Component Value Date   WBC 1.5 (L) 12/02/2024   HGB 8.0 (L) 12/02/2024   HCT 24.2 (L) 12/02/2024   MCV 71.6 (L) 12/02/2024   PLT 11 (LL) 12/02/2024   NEUTROABS 2.1 12/01/2024    Lab Results  Component Value Date   NA 137 12/02/2024   K 5.0 12/02/2024   CL 98 12/02/2024   CO2 22 12/02/2024    Studies:  ECHOCARDIOGRAM COMPLETE BUBBLE STUDY Result Date:  12/02/2024    ECHOCARDIOGRAM REPORT   Patient Name:   ONESHA KREBBS Date of Exam: 12/02/2024 Medical Rec #:  [id number]       Height:       64.0 in Accession #:    [id number]      Weight:       229.3 lb Date of Birth:  Jul 02, 1976      BSA:          2.073 m Patient Age:    49 years        BP:           115/80 mmHg Patient Gender: F               HR:           122 bpm. Exam Location:  [facility] Procedure: 2D Echo, Cardiac Doppler, Color Doppler and Saline Contrast Bubble            Study (Both Spectral and Color Flow Doppler were utilized during            procedure). Indications:     Stroke  History:         Patient has prior history of  Echocardiogram examinations, most                  recent 11/16/2024. Risk Factors:Hypertension. Breast Cancer.  Sonographer:     Philomena Daring Referring Phys:  [id number] NORVAL BAR Diagnosing Phys: Caron Poser IMPRESSIONS  1. Left ventricular ejection fraction, by estimation, is 50 to 55%. The left ventricle has low normal function. Left ventricular endocardial border not optimally defined to evaluate regional wall motion. There is mild left ventricular hypertrophy. Indeterminate diastolic filling due to E-A fusion.  2. Right ventricular systolic function is normal. The right ventricular size is normal. There is severely elevated pulmonary artery systolic pressure. The estimated right ventricular systolic pressure is 60.5 mmHg.  3. The mitral valve is grossly normal. Trivial mitral valve regurgitation. No evidence of mitral stenosis.  4. Tricuspid valve regurgitation is moderate.  5. The aortic valve has an indeterminant number of cusps. Aortic valve regurgitation is not visualized. No aortic stenosis is present.  6. The inferior vena cava is normal in size with greater than 50% respiratory variability, suggesting right atrial pressure of 3 mmHg.  7. Agitated saline contrast bubble study was positive with shunting observed within 3-6 cardiac cycles suggestive of interatrial shunt.  Comparison(s): A prior study was performed on 11/16/2024. No significant change from prior study. FINDINGS  Left Ventricle: Left ventricular ejection fraction, by estimation, is 50 to 55%. The left ventricle has low normal function. Left ventricular endocardial border not optimally defined to evaluate regional wall motion. The left ventricular internal cavity  size was normal in size. There is mild left ventricular hypertrophy. Indeterminate diastolic filling due to E-A fusion. Right Ventricle: The right ventricular size is normal. Right vetricular wall thickness was not well visualized. Right ventricular systolic function is normal. There is severely elevated pulmonary artery systolic pressure. The tricuspid regurgitant velocity is 3.79 m/s, and with an assumed right atrial pressure of 3 mmHg, the estimated right ventricular systolic pressure is 60.5 mmHg. Left Atrium: Left atrial size was normal in size. Right Atrium: Right atrial size was normal in size. Pericardium: There is no evidence of pericardial effusion. Presence of epicardial fat layer. Mitral Valve: The mitral valve is grossly normal. Trivial mitral valve regurgitation. No evidence of mitral valve stenosis. Tricuspid Valve: The tricuspid valve is normal in structure. Tricuspid valve regurgitation is moderate . No evidence of tricuspid stenosis. Aortic Valve: The aortic valve has an indeterminant number of cusps. Aortic valve regurgitation is not visualized. No aortic stenosis is present. Pulmonic Valve: The pulmonic valve was not well visualized. Pulmonic valve regurgitation is mild. No evidence of pulmonic stenosis. Aorta: The aortic root and ascending aorta are structurally normal, with no evidence of dilitation. Venous: The inferior vena cava is normal in size with greater than 50% respiratory variability, suggesting right atrial pressure of 3 mmHg. IAS/Shunts: The interatrial septum was not well visualized. Agitated saline contrast was given  intravenously to evaluate for intracardiac shunting. Agitated saline contrast bubble study was positive with shunting observed within 3-6 cardiac cycles suggestive of interatrial shunt.  LEFT VENTRICLE PLAX 2D LVIDd:         2.90 cm   Diastology LVIDs:         2.10 cm   LV e' medial:    5.98 cm/s LV PW:         1.00 cm   LV E/e' medial:  9.3 LV IVS:        1.20 cm   LV e' lateral:   9.36  cm/s LVOT diam:     2.30 cm   LV E/e' lateral: 5.9 LV SV:         39 LV SV Index:   19 LVOT Area:     4.15 cm  RIGHT VENTRICLE RV S prime:     14.30 cm/s TAPSE (M-mode): 1.5 cm LEFT ATRIUM             Index       RIGHT ATRIUM          Index LA diam:        2.30 cm 1.11 cm/m  RA Area:     9.59 cm LA Vol (A2C):   16.8 ml 8.10 ml/m  RA Volume:   16.30 ml 7.86 ml/m LA Vol (A4C):   18.5 ml 8.92 ml/m LA Biplane Vol: 17.9 ml 8.63 ml/m  AORTIC VALVE LVOT Vmax:   78.30 cm/s LVOT Vmean:  53.700 cm/s LVOT VTI:    0.095 m  AORTA Ao Root diam: 2.70 cm MITRAL VALVE                TRICUSPID VALVE MV Area (PHT): 5.75 cm     TR Peak grad:   57.5 mmHg MV Decel Time: 132 msec     TR Vmax:        379.00 cm/s MV E velocity: 55.60 cm/s MV A velocity: 101.00 cm/s  SHUNTS MV E/A ratio:  0.55         Systemic VTI:  0.10 m                             Systemic Diam: 2.30 cm Caron Poser Electronically signed by Caron Poser Signature Date/Time: 12/02/2024/11:38:17 AM    Final    US  Venous Img Upper Bilat (DVT) Result Date: 12/01/2024 CLINICAL DATA:  Shortness of breath.  Metastatic breast cancer. EXAM: BILATERAL UPPER EXTREMITY VENOUS DOPPLER ULTRASOUND TECHNIQUE: Gray-scale sonography with graded compression, as well as color Doppler and duplex ultrasound were performed to evaluate the bilateral upper extremity deep venous systems from the level of the subclavian vein and including the jugular, axillary, basilic, radial, ulnar and upper cephalic vein. Spectral Doppler was utilized to evaluate flow at rest and with distal augmentation maneuvers.  COMPARISON:  None Available. FINDINGS: RIGHT UPPER EXTREMITY Internal Jugular Vein: No evidence of thrombus. Normal compressibility, respiratory phasicity and color Doppler flow. Subclavian Vein: No evidence of thrombus. Normal color Doppler flow and phasicity. Axillary Vein: No evidence of thrombus. Normal compressibility, respiratory phasicity and response to augmentation. Cephalic Vein: Not visualized. Basilic Vein: No evidence of thrombus. Normal compressibility, respiratory phasicity and response to augmentation. Brachial Veins: No evidence of thrombus. Normal compressibility, respiratory phasicity and response to augmentation. Radial Veins: No evidence of thrombus. Normal compressibility and color Doppler flow. Ulnar Veins: No evidence of thrombus. Normal compressibility and color Doppler flow. Other Findings:  None. LEFT UPPER EXTREMITY Internal Jugular Vein: No evidence of thrombus. Normal compressibility, respiratory phasicity and color Doppler flow. Subclavian Vein: No evidence of thrombus. Normal color Doppler flow and phasicity. Axillary Vein: No evidence of thrombus. Normal compressibility, respiratory phasicity and response to augmentation. Cephalic Vein: No evidence of thrombus. Normal compressibility, respiratory phasicity and response to augmentation. Basilic Vein: No evidence of thrombus. Normal compressibility, respiratory phasicity and response to augmentation. Brachial Veins: No evidence of thrombus. Normal compressibility, respiratory phasicity and response to augmentation. Radial Veins: No evidence of thrombus. Normal compressibility and color Doppler flow.  Ulnar Veins: No evidence of thrombus. Normal compressibility and color Doppler flow. Other Findings:  None. IMPRESSION: No evidence of DVT within either upper extremity. Electronically Signed   By: Juliene Balder M.D.   On: 12/01/2024 09:52   US  Venous Img Lower Bilateral (DVT) Result Date: 12/01/2024 CLINICAL DATA:  Evaluate for deep venous  thrombosis. Metastatic breast cancer. Acute hypoxic respiratory failure. EXAM: BILATERAL LOWER EXTREMITY VENOUS DOPPLER ULTRASOUND TECHNIQUE: Gray-scale sonography with graded compression, as well as color Doppler and duplex ultrasound were performed to evaluate the lower extremity deep venous systems from the level of the common femoral vein and including the common femoral, femoral, profunda femoral, popliteal and calf veins including the posterior tibial, peroneal and gastrocnemius veins when visible. Spectral Doppler was utilized to evaluate flow at rest and with distal augmentation maneuvers in the common femoral, femoral and popliteal veins. COMPARISON:  None Available. FINDINGS: RIGHT LOWER EXTREMITY Common Femoral Vein: No evidence of thrombus. Normal compressibility, respiratory phasicity and response to augmentation. Saphenofemoral Junction: No evidence of thrombus. Normal compressibility and flow on color Doppler imaging. Profunda Femoral Vein: No evidence of thrombus. Normal compressibility and flow on color Doppler imaging. Femoral Vein: No evidence of thrombus. Normal compressibility, respiratory phasicity and response to augmentation. Popliteal Vein: No evidence of thrombus. Normal compressibility, respiratory phasicity and response to augmentation. Calf Veins: No evidence of thrombus. Normal compressibility and flow on color Doppler imaging. Other Findings:  None. LEFT LOWER EXTREMITY Common Femoral Vein: No evidence of thrombus. Normal compressibility, respiratory phasicity and response to augmentation. Saphenofemoral Junction: No evidence of thrombus. Normal compressibility and flow on color Doppler imaging. Profunda Femoral Vein: No evidence of thrombus. Normal compressibility and flow on color Doppler imaging. Femoral Vein: No evidence of thrombus. Normal compressibility, respiratory phasicity and response to augmentation. Popliteal Vein: No evidence of thrombus. Normal compressibility,  respiratory phasicity and response to augmentation. Calf Veins: No evidence of thrombus. Normal compressibility and flow on color Doppler imaging. Other Findings:  None. IMPRESSION: No evidence of deep venous thrombosis in either lower extremity. Electronically Signed   By: Juliene Balder M.D.   On: 12/01/2024 09:47    No problem-specific Assessment & Plan notes found for this encounter.  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  Noted to have significant side effects including protracted thrombocytopenia/leukopenia-see below  # Worsening neutropenia/thrombocytopenia likely secondary to chemotherapy-today platelets are 11 today.  Continue platelet transfusion until upward trend noted.  Given the neutropenia recommend Granix .  # Metastatic breast cancer to the brain-concern for possible progression on recent imaging in November 2025 at [facility].  Clinically asymptomatic at this time-November 29, 2023 brain MRI shows progressive brain lesions; and also evidence of strokes.  Poor candidate for radiation.  Discussed with Dr. Camelia  # Tachycardia/subjective shortness of breath/lactic acidosis Suspect secondary to underlying malignancy- possible infection/possible infiltrate on the CT scan.  On antibiotics as per primary team.  # Elevated LFTs-secondary to underlying malignancy.  Worsening likely similarly malignancy  # I had a long discussion the patient and family along with Josh Borders from palliative care had a lengthy discussion regarding overall poor prognosis and the high risk of death at this hospitalizations.  Discussed that her body is too weak to accept more chemotherapy/or radiation.  However after lengthy  discussion they are  still interested in continuing current scope of care.  At their request-reached out to [facility] transfer line-not accepting any transfers at this time.  Discussed with primary service.  # 40 minutes face-to-face with the patient discussing the above plan of care; more than 50% of time spent on prognosis/ natural history; counseling and coordination.   Cindy JONELLE Joe, MD 12/02/2024  3:42 PM

## 2024-12-02 NOTE — Progress Notes (Signed)
 " PROGRESS NOTE    Tara Hanna  FMW:[id number] DOB: 1976/05/26 DOA: 11/26/2024 PCP: System, Provider Not In  Subjective: No acute events overnight. Seen and examined at bedside with husband present. Reports feeling weak and tired. No new complaints. About to get an echocardiogram. Denies nausea, vomiting, constipation.   Hospital Course: 49 y.o. female with medical history of HTN, metastatic breast cancer with metastasis to liver, brain and bone on chemotherapy (last chemo about 4 days prior to admission). Breast cancer was diagnosed in 2023 and she has recurrent and metastatic disease on palliative chemotherapy with doxorubicin  every 28 days. Last chemo was on 11/22/2024. She presented to the hospital with shortness of breath, abdominal and back pain. She was diagnosed with possible severe sepsis secondary to community-acquired pneumonia.    Assessment and Plan:  Anemia of chronic disease Pancytopenia:  Possibly due to malignancy. Recent chemotherapy likely contributed to pancytopenia Recent PNA may have also contributed a little but less likely given no improvement in counts despite antibiotic course WBC 1.5 < 2.7 Hemoglobin 8 < 8.9  Platelets 11 < 12 Iron studies inconsistent with iron deficiency anemia.  Ferritin level greater than 7500, saturation ratio 49, iron level 107. Status post 2 units platelets already Will give another 1 unit platelets  Oncology following   Metastatic breast cancer  Metastatic disease to the liver, brain, bone:  Continue Keppra  for seizure prophylaxis. S/p 1 cycle of Doxil  on 11/22/2024 Case discussed with Dr. Rennie, oncologist.   Patient is interested in radiation therapy for brain metastasis  Dr. Lenn, radiation oncologist, following Oncology following   Acute embolic stroke Metastatic brain disease  Noted on MRI brain 11/29/24 CTA head/neck 11/30/24 with no LVO or significant stenosis in head/neck Venous US  bilateral arms and legs with  no acute DVT Severe thrombocytopenia cotinues to make use of antiplatelets/anticoagulation a challenge Cont keppra  as elsewhere for seizure prophylaxis TTE w/ bubble study pending SLP signed off PT/OT consulted and following Neurology following Hematology/oncology following   Severe sepsis  Community-acquired pneumonia:  Bcx 1/3 with NGTD, final read pending MRSA screen is negative S/p treatment with IV fluids. Status post 5-day course on antibiotics, finished on 11/30/24   Acute hypoxic respiratory failure:  Currently on 1 < 2 L oxygen via [place], not on oxygen at home Likely in the setting of acute CAP See management of PNA as elsewhere OOB to chair during daytime Incentive spirometry Wean oxygen as tolerated   Sinus tachycardia:  Persistent in nature Likely from underlying acute illness.  Suspect anxiety could be contributing to this.  Chart review shows she was tachycardic with heart rate of 126 at her oncologist office on 11/22/2024. Monitor clinically    Lactic acidosis Persistent Likely in the setting of metastatic liver disease with impaired function/clearance S/p treatment with IV fluids. Will stop trending and monitor clinically    Elevated liver enzymes:  Likely due to metastatic liver disease. Monitor clinically   Anxiety At home on xanax  TID PRN Cont as BID PRN for now   Hyponatremia: Improved.   Hypertension: HCTZ on hold because BP is soft.  DVT prophylaxis: Place and maintain sequential compression device Start: 11/29/24 0851 SCDs Start: 11/26/24 1706  SCDs   Code Status: Full Code Family Communication: updated husband at bedside Disposition Plan: TBD Reason for continuing need for hospitalization: severity of illness  Objective: Vitals:   12/02/24 0636 12/02/24 0700 12/02/24 0745 12/02/24 1143  BP: 115/78 112/83 115/80 100/75  Pulse:    ROLLEN)  120  Resp:  20  16  Temp:  98.6 F (37 C) 97.6 F (36.4 C) 97.6 F (36.4 C)  TempSrc:  Oral Oral    SpO2:  98%  96%  Weight:      Height:        Intake/Output Summary (Last 24 hours) at 12/02/2024 1200 Last data filed at 12/01/2024 2000 Gross per 24 hour  Intake 467.08 ml  Output --  Net 467.08 ml   Filed Weights   11/30/24 0500 12/01/24 0500 12/02/24 0500  Weight: 101 kg 100 kg 104 kg    Examination:  Physical Exam Vitals and nursing note reviewed.  Constitutional:      General: She is not in acute distress.    Appearance: She is ill-appearing.  HENT:     Head: Normocephalic and atraumatic.  Cardiovascular:     Rate and Rhythm: Normal rate and regular rhythm.     Pulses: Normal pulses.     Heart sounds: Normal heart sounds.  Pulmonary:     Effort: Pulmonary effort is normal.     Breath sounds: Normal breath sounds.  Abdominal:     General: Bowel sounds are normal.     Palpations: Abdomen is soft.  Neurological:     Mental Status: She is alert.     Data Reviewed: I have personally reviewed following labs and imaging studies  CBC: Recent Labs  Lab 11/26/24 1302 11/27/24 0349 11/29/24 0559 11/30/24 0541 11/30/24 0834 11/30/24 1617 12/01/24 0412 12/02/24 0449  WBC 6.0   < > 2.8* 2.7* 2.8* 2.6* 2.6* 1.5*  NEUTROABS 5.0  --  2.3  --  2.3  --  2.1  --   HGB 11.2*   < > 8.4* 8.9* 8.5* 8.0* 8.7* 8.0*  HCT 34.8*   < > 25.7* 26.8* 26.0* 24.3* 26.2* 24.2*  MCV 74.2*   < > 74.1* 72.8* 73.4* 73.4* 73.4* 71.6*  PLT 87*   < > 26* 12* 10* 24* 12* 11*   < > = values in this interval not displayed.   Basic Metabolic Panel: Recent Labs  Lab 11/26/24 1302 11/27/24 0349 11/29/24 0559 11/30/24 0541 11/30/24 0834 12/01/24 0412 12/02/24 0449  NA 138   < > 137 136 135 135 137  K 4.1   < > 3.8 4.3 4.8 4.6 5.0  CL 97*   < > 101 99 99 98 98  CO2 24   < > 24 24 21* 20* 22  GLUCOSE 103*   < > 97 107* 162* 112* 102*  BUN 11   < > 7 6 6 6 7   CREATININE 0.68   < > 0.57 0.51 0.57 0.63 0.67  CALCIUM 9.5   < > 8.3* 8.1* 7.9* 8.1* 8.2*  MG 1.8  --  1.8  --   --   --   --    PHOS  --   --  1.9*  --   --   --   --    < > = values in this interval not displayed.   GFR: Estimated Creatinine Clearance: 99.9 mL/min (by C-G formula based on SCr of 0.67 mg/dL). Liver Function Tests: Recent Labs  Lab 11/26/24 1302 11/28/24 0555 11/29/24 0559 11/30/24 0541 12/02/24 0449  AST 617* 558*  --  486* 663*  ALT 503* 452*  --  362* 341*  ALKPHOS 759* 531*  --  515* 467*  BILITOT 1.0 1.0  --  1.2 1.5*  PROT 7.0 5.9*  --  5.8* 5.4*  ALBUMIN 3.4* 2.7* 2.6* 2.6* 2.5*   No results for input(s): LIPASE, AMYLASE in the last 168 hours. No results for input(s): AMMONIA in the last 168 hours. Coagulation Profile: Recent Labs  Lab 11/26/24 1302  INR 1.0   Cardiac Enzymes: No results for input(s): CKTOTAL, CKMB, CKMBINDEX, TROPONINI in the last 168 hours. ProBNP, BNP (last 5 results) No results for input(s): PROBNP, BNP in the last 8760 hours. HbA1C: No results for input(s): HGBA1C in the last 72 hours. CBG: Recent Labs  Lab 11/28/24 1600 11/28/24 2014 11/29/24 0820 11/29/24 1135 11/29/24 1706  GLUCAP 145* 117* 108* 96 79   Lipid Profile: No results for input(s): CHOL, HDL, LDLCALC, TRIG, CHOLHDL, LDLDIRECT in the last 72 hours. Thyroid  Function Tests: No results for input(s): TSH, T4TOTAL, FREET4, T3FREE, THYROIDAB in the last 72 hours. Anemia Panel: Recent Labs    11/30/24 0541  VITAMINB12 >4,000*   Sepsis Labs: Recent Labs  Lab 11/26/24 2237 11/27/24 0349 11/28/24 0555 11/29/24 0100 11/29/24 0559 12/01/24 0412  PROCALCITON 1.52  --   --   --   --   --   LATICACIDVEN 3.6*   < > 3.4* 2.9* 3.3* 6.4*   < > = values in this interval not displayed.    Recent Results (from the past 240 hours)  Culture, blood (Routine x 2)     Status: None (Preliminary result)   Collection Time: 11/26/24  1:01 PM   Specimen: BLOOD  Result Value Ref Range Status   Specimen Description BLOOD RIGHT ANTECUBITAL  Final    Special Requests   Final    BOTTLES DRAWN AEROBIC AND ANAEROBIC Blood Culture results may not be optimal due to an inadequate volume of blood received in culture bottles   Culture   Final    NO GROWTH 4 DAYS Performed at [facility], [address] [place], [place] [address]    Report Status PENDING  Incomplete  Resp panel by RT-PCR (RSV, Flu A&B, Covid) Anterior Nasal Swab     Status: None   Collection Time: 11/26/24  1:30 PM   Specimen: Anterior Nasal Swab  Result Value Ref Range Status   SARS Coronavirus 2 by RT PCR NEGATIVE NEGATIVE Final    Comment: (NOTE) SARS-CoV-2 target nucleic acids are NOT DETECTED.  The SARS-CoV-2 RNA is generally detectable in upper respiratory specimens during the acute phase of infection. The lowest concentration of SARS-CoV-2 viral copies this assay can detect is 138 copies/mL. A negative result does not preclude SARS-Cov-2 infection and should not be used as the sole basis for treatment or other patient management decisions. A negative result may occur with  improper specimen collection/handling, submission of specimen other than nasopharyngeal swab, presence of viral mutation(s) within the areas targeted by this assay, and inadequate number of viral copies(<138 copies/mL). A negative result must be combined with clinical observations, patient history, and epidemiological information. The expected result is Negative.  Fact Sheet for Patients:  bloggercourse.com  Fact Sheet for Healthcare Providers:  seriousbroker.it  This test is no t yet approved or cleared by the [place]  FDA and  has been authorized for detection and/or diagnosis of SARS-CoV-2 by FDA under an Emergency Use Authorization (EUA). This EUA will remain  in effect (meaning this test can be used) for the duration of the COVID-19 declaration under Section 564(b)(1) of the Act, 21 U.S.C.section 360bbb-3(b)(1), unless  the authorization is terminated  or revoked sooner.       Influenza  A by PCR NEGATIVE NEGATIVE Final   Influenza B by PCR NEGATIVE NEGATIVE Final    Comment: (NOTE) The Xpert Xpress SARS-CoV-2/FLU/RSV plus assay is intended as an aid in the diagnosis of influenza from Nasopharyngeal swab specimens and should not be used as a sole basis for treatment. Nasal washings and aspirates are unacceptable for Xpert Xpress SARS-CoV-2/FLU/RSV testing.  Fact Sheet for Patients: bloggercourse.com  Fact Sheet for Healthcare Providers: seriousbroker.it  This test is not yet approved or cleared by the [place]  FDA and has been authorized for detection and/or diagnosis of SARS-CoV-2 by FDA under an Emergency Use Authorization (EUA). This EUA will remain in effect (meaning this test can be used) for the duration of the COVID-19 declaration under Section 564(b)(1) of the Act, 21 U.S.C. section 360bbb-3(b)(1), unless the authorization is terminated or revoked.     Resp Syncytial Virus by PCR NEGATIVE NEGATIVE Final    Comment: (NOTE) Fact Sheet for Patients: bloggercourse.com  Fact Sheet for Healthcare Providers: seriousbroker.it  This test is not yet approved or cleared by the [place]  FDA and has been authorized for detection and/or diagnosis of SARS-CoV-2 by FDA under an Emergency Use Authorization (EUA). This EUA will remain in effect (meaning this test can be used) for the duration of the COVID-19 declaration under Section 564(b)(1) of the Act, 21 U.S.C. section 360bbb-3(b)(1), unless the authorization is terminated or revoked.  Performed at [facility], [address] [place], [place] [address]   Culture, blood (Routine x 2)     Status: None (Preliminary result)   Collection Time: 11/26/24  1:40 PM   Specimen: BLOOD  Result Value Ref Range Status   Specimen  Description BLOOD BLOOD RIGHT HAND  Final   Special Requests   Final    BOTTLES DRAWN AEROBIC AND ANAEROBIC Blood Culture adequate volume   Culture   Final    NO GROWTH 4 DAYS Performed at [facility], [address], [place], [place] [address]    Report Status PENDING  Incomplete  MRSA Next Gen by PCR, Nasal     Status: None   Collection Time: 11/29/24  8:31 PM   Specimen: Nasal Mucosa; Nasal Swab  Result Value Ref Range Status   MRSA by PCR Next Gen NOT DETECTED NOT DETECTED Final    Comment: (NOTE) The GeneXpert MRSA Assay (FDA approved for NASAL specimens only), is one component of a comprehensive MRSA colonization surveillance program. It is not intended to diagnose MRSA infection nor to guide or monitor treatment for MRSA infections. Test performance is not FDA approved in patients less than 46 years old. Performed at [facility], [address] [place], [place] [address]      Radiology Studies: ECHOCARDIOGRAM COMPLETE BUBBLE STUDY Result Date: 12/02/2024    ECHOCARDIOGRAM REPORT   Patient Name:   Tara Hanna Date of Exam: 12/02/2024 Medical Rec #:  [id number]       Height:       64.0 in Accession #:    [id number]      Weight:       229.3 lb Date of Birth:  03/08/1976      BSA:          2.073 m Patient Age:    48 years        BP:           115/80 mmHg Patient Gender: F  HR:           122 bpm. Exam Location:  [facility] Procedure: 2D Echo, Cardiac Doppler, Color Doppler and Saline Contrast Bubble            Study (Both Spectral and Color Flow Doppler were utilized during            procedure). Indications:     Stroke  History:         Patient has prior history of Echocardiogram examinations, most                  recent 11/16/2024. Risk Factors:Hypertension. Breast Cancer.  Sonographer:     Philomena Daring Referring Phys:  [id number] NORVAL BAR Diagnosing Phys: Caron Poser IMPRESSIONS  1. Left ventricular ejection fraction, by estimation, is 50 to 55%. The  left ventricle has low normal function. Left ventricular endocardial border not optimally defined to evaluate regional wall motion. There is mild left ventricular hypertrophy. Indeterminate diastolic filling due to E-A fusion.  2. Right ventricular systolic function is normal. The right ventricular size is normal. There is severely elevated pulmonary artery systolic pressure. The estimated right ventricular systolic pressure is 60.5 mmHg.  3. The mitral valve is grossly normal. Trivial mitral valve regurgitation. No evidence of mitral stenosis.  4. Tricuspid valve regurgitation is moderate.  5. The aortic valve has an indeterminant number of cusps. Aortic valve regurgitation is not visualized. No aortic stenosis is present.  6. The inferior vena cava is normal in size with greater than 50% respiratory variability, suggesting right atrial pressure of 3 mmHg.  7. Agitated saline contrast bubble study was positive with shunting observed within 3-6 cardiac cycles suggestive of interatrial shunt. Comparison(s): A prior study was performed on 11/16/2024. No significant change from prior study. FINDINGS  Left Ventricle: Left ventricular ejection fraction, by estimation, is 50 to 55%. The left ventricle has low normal function. Left ventricular endocardial border not optimally defined to evaluate regional wall motion. The left ventricular internal cavity  size was normal in size. There is mild left ventricular hypertrophy. Indeterminate diastolic filling due to E-A fusion. Right Ventricle: The right ventricular size is normal. Right vetricular wall thickness was not well visualized. Right ventricular systolic function is normal. There is severely elevated pulmonary artery systolic pressure. The tricuspid regurgitant velocity is 3.79 m/s, and with an assumed right atrial pressure of 3 mmHg, the estimated right ventricular systolic pressure is 60.5 mmHg. Left Atrium: Left atrial size was normal in size. Right Atrium: Right  atrial size was normal in size. Pericardium: There is no evidence of pericardial effusion. Presence of epicardial fat layer. Mitral Valve: The mitral valve is grossly normal. Trivial mitral valve regurgitation. No evidence of mitral valve stenosis. Tricuspid Valve: The tricuspid valve is normal in structure. Tricuspid valve regurgitation is moderate . No evidence of tricuspid stenosis. Aortic Valve: The aortic valve has an indeterminant number of cusps. Aortic valve regurgitation is not visualized. No aortic stenosis is present. Pulmonic Valve: The pulmonic valve was not well visualized. Pulmonic valve regurgitation is mild. No evidence of pulmonic stenosis. Aorta: The aortic root and ascending aorta are structurally normal, with no evidence of dilitation. Venous: The inferior vena cava is normal in size with greater than 50% respiratory variability, suggesting right atrial pressure of 3 mmHg. IAS/Shunts: The interatrial septum was not well visualized. Agitated saline contrast was given intravenously to evaluate for intracardiac shunting. Agitated saline contrast bubble study was positive with shunting observed within 3-6 cardiac cycles  suggestive of interatrial shunt.  LEFT VENTRICLE PLAX 2D LVIDd:         2.90 cm   Diastology LVIDs:         2.10 cm   LV e' medial:    5.98 cm/s LV PW:         1.00 cm   LV E/e' medial:  9.3 LV IVS:        1.20 cm   LV e' lateral:   9.36 cm/s LVOT diam:     2.30 cm   LV E/e' lateral: 5.9 LV SV:         39 LV SV Index:   19 LVOT Area:     4.15 cm  RIGHT VENTRICLE RV S prime:     14.30 cm/s TAPSE (M-mode): 1.5 cm LEFT ATRIUM             Index       RIGHT ATRIUM          Index LA diam:        2.30 cm 1.11 cm/m  RA Area:     9.59 cm LA Vol (A2C):   16.8 ml 8.10 ml/m  RA Volume:   16.30 ml 7.86 ml/m LA Vol (A4C):   18.5 ml 8.92 ml/m LA Biplane Vol: 17.9 ml 8.63 ml/m  AORTIC VALVE LVOT Vmax:   78.30 cm/s LVOT Vmean:  53.700 cm/s LVOT VTI:    0.095 m  AORTA Ao Root diam: 2.70 cm  MITRAL VALVE                TRICUSPID VALVE MV Area (PHT): 5.75 cm     TR Peak grad:   57.5 mmHg MV Decel Time: 132 msec     TR Vmax:        379.00 cm/s MV E velocity: 55.60 cm/s MV A velocity: 101.00 cm/s  SHUNTS MV E/A ratio:  0.55         Systemic VTI:  0.10 m                             Systemic Diam: 2.30 cm Caron Poser Electronically signed by Caron Poser Signature Date/Time: 12/02/2024/11:38:17 AM    Final    US  Venous Img Upper Bilat (DVT) Result Date: 12/01/2024 CLINICAL DATA:  Shortness of breath.  Metastatic breast cancer. EXAM: BILATERAL UPPER EXTREMITY VENOUS DOPPLER ULTRASOUND TECHNIQUE: Gray-scale sonography with graded compression, as well as color Doppler and duplex ultrasound were performed to evaluate the bilateral upper extremity deep venous systems from the level of the subclavian vein and including the jugular, axillary, basilic, radial, ulnar and upper cephalic vein. Spectral Doppler was utilized to evaluate flow at rest and with distal augmentation maneuvers. COMPARISON:  None Available. FINDINGS: RIGHT UPPER EXTREMITY Internal Jugular Vein: No evidence of thrombus. Normal compressibility, respiratory phasicity and color Doppler flow. Subclavian Vein: No evidence of thrombus. Normal color Doppler flow and phasicity. Axillary Vein: No evidence of thrombus. Normal compressibility, respiratory phasicity and response to augmentation. Cephalic Vein: Not visualized. Basilic Vein: No evidence of thrombus. Normal compressibility, respiratory phasicity and response to augmentation. Brachial Veins: No evidence of thrombus. Normal compressibility, respiratory phasicity and response to augmentation. Radial Veins: No evidence of thrombus. Normal compressibility and color Doppler flow. Ulnar Veins: No evidence of thrombus. Normal compressibility and color Doppler flow. Other Findings:  None. LEFT UPPER EXTREMITY Internal Jugular Vein: No evidence of thrombus. Normal compressibility, respiratory  phasicity and  color Doppler flow. Subclavian Vein: No evidence of thrombus. Normal color Doppler flow and phasicity. Axillary Vein: No evidence of thrombus. Normal compressibility, respiratory phasicity and response to augmentation. Cephalic Vein: No evidence of thrombus. Normal compressibility, respiratory phasicity and response to augmentation. Basilic Vein: No evidence of thrombus. Normal compressibility, respiratory phasicity and response to augmentation. Brachial Veins: No evidence of thrombus. Normal compressibility, respiratory phasicity and response to augmentation. Radial Veins: No evidence of thrombus. Normal compressibility and color Doppler flow. Ulnar Veins: No evidence of thrombus. Normal compressibility and color Doppler flow. Other Findings:  None. IMPRESSION: No evidence of DVT within either upper extremity. Electronically Signed   By: Juliene Balder M.D.   On: 12/01/2024 09:52   US  Venous Img Lower Bilateral (DVT) Result Date: 12/01/2024 CLINICAL DATA:  Evaluate for deep venous thrombosis. Metastatic breast cancer. Acute hypoxic respiratory failure. EXAM: BILATERAL LOWER EXTREMITY VENOUS DOPPLER ULTRASOUND TECHNIQUE: Gray-scale sonography with graded compression, as well as color Doppler and duplex ultrasound were performed to evaluate the lower extremity deep venous systems from the level of the common femoral vein and including the common femoral, femoral, profunda femoral, popliteal and calf veins including the posterior tibial, peroneal and gastrocnemius veins when visible. Spectral Doppler was utilized to evaluate flow at rest and with distal augmentation maneuvers in the common femoral, femoral and popliteal veins. COMPARISON:  None Available. FINDINGS: RIGHT LOWER EXTREMITY Common Femoral Vein: No evidence of thrombus. Normal compressibility, respiratory phasicity and response to augmentation. Saphenofemoral Junction: No evidence of thrombus. Normal compressibility and flow on color Doppler  imaging. Profunda Femoral Vein: No evidence of thrombus. Normal compressibility and flow on color Doppler imaging. Femoral Vein: No evidence of thrombus. Normal compressibility, respiratory phasicity and response to augmentation. Popliteal Vein: No evidence of thrombus. Normal compressibility, respiratory phasicity and response to augmentation. Calf Veins: No evidence of thrombus. Normal compressibility and flow on color Doppler imaging. Other Findings:  None. LEFT LOWER EXTREMITY Common Femoral Vein: No evidence of thrombus. Normal compressibility, respiratory phasicity and response to augmentation. Saphenofemoral Junction: No evidence of thrombus. Normal compressibility and flow on color Doppler imaging. Profunda Femoral Vein: No evidence of thrombus. Normal compressibility and flow on color Doppler imaging. Femoral Vein: No evidence of thrombus. Normal compressibility, respiratory phasicity and response to augmentation. Popliteal Vein: No evidence of thrombus. Normal compressibility, respiratory phasicity and response to augmentation. Calf Veins: No evidence of thrombus. Normal compressibility and flow on color Doppler imaging. Other Findings:  None. IMPRESSION: No evidence of deep venous thrombosis in either lower extremity. Electronically Signed   By: Juliene Balder M.D.   On: 12/01/2024 09:47    Scheduled Meds:  sodium chloride    Intravenous Once   Chlorhexidine  Gluconate Cloth  6 each Topical Daily   feeding supplement  237 mL Oral BID BM   levETIRAcetam   500 mg Oral BID   sodium chloride  flush  10-40 mL Intracatheter Q12H   Continuous Infusions:   LOS: 5 days   Norval Bar, MD  [facility]  12/02/2024, 12:00 PM   "

## 2024-12-02 NOTE — Progress Notes (Signed)
 Date and time results received: 12/02/2024 2030   Test: WBC Critical Value: 1.0 Name of Provider Notified: Dr Madison Peaches  Orders Received? Or Actions Taken?: no new orders

## 2024-12-02 NOTE — Progress Notes (Signed)
 SPIRITUAL CARE AND COUNSELING CONSULT NOTE   VISIT SUMMARY Chaplain visited patient per referral from colleague. Chaplain offered a compassionate presence.   SPIRITUAL ENCOUNTER                                                                                                                                                                      Type of Visit: Initial Care provided to:: Patient Conversation partners present during encounter: Nurse Referral source: Chaplain assessment Reason for visit: Routine spiritual support OnCall Visit: Yes   SPIRITUAL FRAMEWORK      GOALS       INTERVENTIONS        INTERVENTION OUTCOMES      SPIRITUAL CARE PLAN        If immediate needs arise, please contact [facility] 24 hour on call [phone]   Rana Davis  12/02/2024 5:21 PM

## 2024-12-03 DIAGNOSIS — J189 Pneumonia, unspecified organism: Secondary | ICD-10-CM | POA: Diagnosis not present

## 2024-12-03 DIAGNOSIS — A419 Sepsis, unspecified organism: Secondary | ICD-10-CM | POA: Diagnosis not present

## 2024-12-03 LAB — BPAM PLATELET PHERESIS
Blood Product Expiration Date: 202601112359
ISSUE DATE / TIME: 202601090640
Unit Type and Rh: 5100

## 2024-12-03 LAB — CBC
HCT: 21.2 % — ABNORMAL LOW (ref 36.0–46.0)
Hemoglobin: 7.3 g/dL — ABNORMAL LOW (ref 12.0–15.0)
MCH: 24 pg — ABNORMAL LOW (ref 26.0–34.0)
MCHC: 34.4 g/dL (ref 30.0–36.0)
MCV: 69.7 fL — ABNORMAL LOW (ref 80.0–100.0)
Platelets: 7 K/uL — CL (ref 150–400)
RBC: 3.04 MIL/uL — ABNORMAL LOW (ref 3.87–5.11)
RDW: 21.8 % — ABNORMAL HIGH (ref 11.5–15.5)
WBC: 1 K/uL — CL (ref 4.0–10.5)
nRBC: 0 % (ref 0.0–0.2)

## 2024-12-03 LAB — PREPARE PLATELET PHERESIS: Unit division: 0

## 2024-12-03 MED ORDER — SODIUM CHLORIDE 0.9% IV SOLUTION: Freq: Once | INTRAVENOUS | Status: DC

## 2024-12-03 MED ORDER — LIDOCAINE VISCOUS HCL 2 % MT SOLN
15.0000 mL | OROMUCOSAL | Status: DC | PRN
  Administered 2024-12-03 – 2024-12-05 (×6): 15 mL via OROMUCOSAL
  Filled 2024-12-03 (×8): qty 15

## 2024-12-03 NOTE — Plan of Care (Signed)
 TTE with bubble study: 1. Left ventricular ejection fraction, by estimation, is 50 to 55%. The  left ventricle has low normal function. Left ventricular endocardial  border not optimally defined to evaluate regional wall motion. There is  mild left ventricular hypertrophy.  Indeterminate diastolic filling due to E-A fusion.   2. Right ventricular systolic function is normal. The right ventricular  size is normal. There is severely elevated pulmonary artery systolic  pressure. The estimated right ventricular systolic pressure is 60.5 mmHg.   3. The mitral valve is grossly normal. Trivial mitral valve  regurgitation. No evidence of mitral stenosis.   4. Tricuspid valve regurgitation is moderate.   5. The aortic valve has an indeterminant number of cusps. Aortic valve  regurgitation is not visualized. No aortic stenosis is present.   6. The inferior vena cava is normal in size with greater than 50%  respiratory variability, suggesting right atrial pressure of 3 mmHg.   7. Agitated saline contrast bubble study was positive with shunting  observed within 3-6 cardiac cycles suggestive of interatrial shunt.  Comparison(s): A prior study was performed on 11/16/2024. No significant  change from prior study.   Assessment: 49 y.o. female with a PMHx of metastatic breast cancer (metastases to liver, brain and bone), on chemotherapy, uterine fibroids and HTN who presented to the hospital on Saturday 11/26/24 with SOB for the last few days in conjunction with abdominal bloating, abdominal pain and back pain. She was diagnosed with possible severe sepsis secondary to CAP. She was started on ABX. She has had no growth on blood cultures thus far. She has had persistently elevated lactic acid thought possibly to be due to metastatic disease involving her liver. She has also been diagnosed with severe thrombocytopenia. Platelets have trended downward and are 26 today. MRI brain was obtained revealing an acute  embolic strokes and brain metastatic disease. She is currently on Keppra  for seizure prophylaxis.  - MRI brain (personally reviewed): Positive for multiple scattered and small acute embolic infarcts in both the left cerebral and right cerebellar hemispheres. No associated acute hemorrhage or mass effect. Superimposed are five enhancing intracranial metastases, with slight interval progression in the right cerebellum since outside MRI last month. Subtle right hemisphere pachymeningeal thickening and enhancement also appears increased since 2024, with underlying diffuse osseous metastatic disease  - CTA of head and neck: No large vessel occlusion no significant stenosis in the head or neck. Known metastatic breast cancer including brain and osseous metastases. Abnormal left orbital soft tissue is also suspicious for metastasis. - TTE: Positive for interatrial shunt - BLE and BUE venous ultrasound studies: No evidence of deep venous thrombosis in the lower or upper extremities.  - Continues to be pancytopenic with severe thrombocytopenia (platelets of 1 K/microL) - Impression: - Acute multifocal embolic strokes. Her severe thrombocytopenia may make use of antiplatelets/anticoagulation a challenge. Paradoxical embolization via the left to right cardiac shunt seen on TTE is the most likely etiology for her acute strokes. Hypercoagulable state in the context of her metastatic breast cancer is likely playing a role.  - Metastatic brain disease. Five metastatic lesions are seen on MRI.   Recommendations: - Stroke workup is complete. - Although she is at high risk for recurrent cardioembolic strokes, most likely via paradoxical embolization of clots through a PFO, anticoagulation is not currently an option due to her severe thrombocytopenia. Will not be able to treat until platelets recover back to normal given the high risk for hemorrhage.  - Continue  Keppra  for seizure prophylaxis - Cardiac telemetry -  Oncology is following - BP management per standard protocol.  - Cardiac telemetry - PT/OT - Neurohospitalist service will sign off. Please call if there are additional questions.   Electronically signed: Dr. Zethan Alfieri

## 2024-12-03 NOTE — Progress Notes (Signed)
 " PROGRESS NOTE    Tara Hanna  FMW:[id number] DOB: Mar 25, 1976 DOA: 11/26/2024 PCP: System, Provider Not In  Subjective: No acute events overnight. As per nursing, noted to have O2 sats in early 90s and placed on oxygen for comfort. Seen and examined at bedside with family present. Reports feeling weak and tired. No new complaints. About to get an echocardiogram. Denies nausea, vomiting, constipation    Hospital Course: 49 y.o. female with medical history of HTN, metastatic breast cancer with metastasis to liver, brain and bone on chemotherapy (last chemo about 4 days prior to admission). Breast cancer was diagnosed in 2023 and she has recurrent and metastatic disease on palliative chemotherapy with doxorubicin  every 28 days. Last chemo was on 11/22/2024. She presented to the hospital with shortness of breath, abdominal and back pain. She was diagnosed with possible severe sepsis secondary to community-acquired pneumonia.    Assessment and Plan:  Metastatic breast cancer  Metastatic disease to the liver, brain, bone:  Continue Keppra  for seizure prophylaxis. S/p 1 cycle of Doxil  on 11/22/2024 Patient is not a good candidate for any metastatic cancer treatments as per oncology  Patient/family still interested in radiation therapy for brain metastasis  Patient/family want second opinion Unfortunately, [facility] does not have any available beds for patient to be transferred for oncology evaluation and second opinion. Patient remains severely ill to be able to get discharged and taken to a tertiary care center at this time Dr. Lenn, radiation oncologist, following Oncology following  Anemia of chronic disease Pancytopenia:  Possibly due to malignancy. Recent chemotherapy likely contributed to pancytopenia Recent PNA may have also contributed a little but less likely given no improvement in counts despite antibiotic course WBC 1 < 2.7 Hemoglobin 7.3 < 8.9  Platelets 7 < 12 Iron studies  inconsistent with iron deficiency anemia.  Ferritin level greater than 7500, saturation ratio 49, iron level 107. Status post 3 units platelets already Will give another 2 unit platelets  Oncology following   Acute embolic stroke Metastatic brain disease  Noted on MRI brain 11/29/24 CTA head/neck 11/30/24 with no LVO or significant stenosis in head/neck Venous US  bilateral arms and legs with no acute DVT TTE w/ bubble study positive for PFO Severe thrombocytopenia cotinues to make use of antiplatelets/anticoagulation and consideration for PFO closure a challenge Cont keppra  as elsewhere for seizure prophylaxis SLP signed off PT/OT following Neurology following Hematology/oncology following   Severe sepsis  Community-acquired pneumonia:  Bcx 1/3 with NGTD, final read pending MRSA screen is negative S/p treatment with IV fluids. Status post 5-day course on antibiotics, finished on 11/30/24   Acute hypoxic respiratory failure:  Currently on 1 < 2 L oxygen via [place], not on oxygen at home Likely in the setting of acute CAP See management of PNA as elsewhere OOB to chair during daytime Incentive spirometry Wean oxygen as tolerated   Sinus tachycardia:  Persistent in nature Likely from underlying acute illness.  Suspect anxiety could be contributing to this.  Chart review shows she was tachycardic with heart rate of 126 at her oncologist office on 11/22/2024. Monitor clinically    Lactic acidosis Persistent Likely in the setting of metastatic liver disease with impaired function/clearance S/p treatment with IV fluids. Will trend only periodically     Elevated liver enzymes:  Likely due to metastatic liver disease. Monitor clinically   Anxiety At home on xanax  TID PRN Cont as BID PRN for now   Hyponatremia: Improved.   Hypertension: HCTZ on hold  because BP is soft.  DVT prophylaxis: Place and maintain sequential compression device Start: 11/29/24 0851 SCDs Start: 11/26/24  1706  SCDs   Code Status: Full Code Family Communication: updated daughter and extended family at bedside Disposition Plan: TBD Reason for continuing need for hospitalization: severity of illness  Objective: Vitals:   12/03/24 0410 12/03/24 0411 12/03/24 0808 12/03/24 1116  BP: 113/81  102/74   Pulse:   (!) 122 (!) 128  Resp: 20     Temp: 98.9 F (37.2 C)  98.8 F (37.1 C) 97.8 F (36.6 C)  TempSrc: Oral  Oral Oral  SpO2: 97%  94% 91%  Weight:  103.7 kg    Height:        Intake/Output Summary (Last 24 hours) at 12/03/2024 1131 Last data filed at 12/03/2024 9786 Gross per 24 hour  Intake 160 ml  Output 675 ml  Net -515 ml   Filed Weights   12/01/24 0500 12/02/24 0500 12/03/24 0411  Weight: 100 kg 104 kg 103.7 kg    Examination:  Physical Exam Vitals and nursing note reviewed.  Constitutional:      General: She is not in acute distress.    Appearance: She is ill-appearing.     Comments: Weak, frail  HENT:     Head: Normocephalic and atraumatic.  Cardiovascular:     Rate and Rhythm: Normal rate and regular rhythm.     Pulses: Normal pulses.     Heart sounds: Normal heart sounds.  Pulmonary:     Effort: Pulmonary effort is normal.     Breath sounds: Normal breath sounds.  Abdominal:     General: Bowel sounds are normal.     Palpations: Abdomen is soft.  Neurological:     Mental Status: She is alert.     Data Reviewed: I have personally reviewed following labs and imaging studies  CBC: Recent Labs  Lab 11/26/24 1302 11/27/24 0349 11/29/24 0559 11/30/24 0541 11/30/24 0834 11/30/24 1617 12/01/24 0412 12/02/24 0449 12/02/24 2012 12/03/24 0606  WBC 6.0   < > 2.8*   < > 2.8* 2.6* 2.6* 1.5* 1.0* 1.0*  NEUTROABS 5.0  --  2.3  --  2.3  --  2.1  --  0.8*  --   HGB 11.2*   < > 8.4*   < > 8.5* 8.0* 8.7* 8.0* 7.5* 7.3*  HCT 34.8*   < > 25.7*   < > 26.0* 24.3* 26.2* 24.2* 22.0* 21.2*  MCV 74.2*   < > 74.1*   < > 73.4* 73.4* 73.4* 71.6* 70.5* 69.7*  PLT 87*    < > 26*   < > 10* 24* 12* 11* 15* 7*   < > = values in this interval not displayed.   Basic Metabolic Panel: Recent Labs  Lab 11/26/24 1302 11/27/24 0349 11/29/24 0559 11/30/24 0541 11/30/24 0834 12/01/24 0412 12/02/24 0449 12/02/24 2013  NA 138   < > 137 136 135 135 137 135  K 4.1   < > 3.8 4.3 4.8 4.6 5.0 4.9  CL 97*   < > 101 99 99 98 98 96*  CO2 24   < > 24 24 21* 20* 22 20*  GLUCOSE 103*   < > 97 107* 162* 112* 102* 157*  BUN 11   < > 7 6 6 6 7 10   CREATININE 0.68   < > 0.57 0.51 0.57 0.63 0.67 0.61  CALCIUM 9.5   < > 8.3* 8.1* 7.9* 8.1* 8.2* 8.1*  MG 1.8  --  1.8  --   --   --   --   --   PHOS  --   --  1.9*  --   --   --   --   --    < > = values in this interval not displayed.   GFR: Estimated Creatinine Clearance: 99.8 mL/min (by C-G formula based on SCr of 0.61 mg/dL). Liver Function Tests: Recent Labs  Lab 11/26/24 1302 11/28/24 0555 11/29/24 0559 11/30/24 0541 12/02/24 0449  AST 617* 558*  --  486* 663*  ALT 503* 452*  --  362* 341*  ALKPHOS 759* 531*  --  515* 467*  BILITOT 1.0 1.0  --  1.2 1.5*  PROT 7.0 5.9*  --  5.8* 5.4*  ALBUMIN 3.4* 2.7* 2.6* 2.6* 2.5*   No results for input(s): LIPASE, AMYLASE in the last 168 hours. No results for input(s): AMMONIA in the last 168 hours. Coagulation Profile: Recent Labs  Lab 11/26/24 1302  INR 1.0   Cardiac Enzymes: No results for input(s): CKTOTAL, CKMB, CKMBINDEX, TROPONINI in the last 168 hours. ProBNP, BNP (last 5 results) No results for input(s): PROBNP, BNP in the last 8760 hours. HbA1C: No results for input(s): HGBA1C in the last 72 hours. CBG: Recent Labs  Lab 11/28/24 1600 11/28/24 2014 11/29/24 0820 11/29/24 1135 11/29/24 1706  GLUCAP 145* 117* 108* 96 79   Lipid Profile: No results for input(s): CHOL, HDL, LDLCALC, TRIG, CHOLHDL, LDLDIRECT in the last 72 hours. Thyroid  Function Tests: No results for input(s): TSH, T4TOTAL, FREET4, T3FREE,  THYROIDAB in the last 72 hours. Anemia Panel: No results for input(s): VITAMINB12, FOLATE, FERRITIN, TIBC, IRON, RETICCTPCT in the last 72 hours. Sepsis Labs: Recent Labs  Lab 11/26/24 2237 11/27/24 0349 11/28/24 0555 11/29/24 0100 11/29/24 0559 12/01/24 0412  PROCALCITON 1.52  --   --   --   --   --   LATICACIDVEN 3.6*   < > 3.4* 2.9* 3.3* 6.4*   < > = values in this interval not displayed.    Recent Results (from the past 240 hours)  Culture, blood (Routine x 2)     Status: None (Preliminary result)   Collection Time: 11/26/24  1:01 PM   Specimen: BLOOD  Result Value Ref Range Status   Specimen Description BLOOD RIGHT ANTECUBITAL  Final   Special Requests   Final    BOTTLES DRAWN AEROBIC AND ANAEROBIC Blood Culture results may not be optimal due to an inadequate volume of blood received in culture bottles   Culture   Final    NO GROWTH 4 DAYS Performed at [facility], [address], [place], [place] [address]    Report Status PENDING  Incomplete  Resp panel by RT-PCR (RSV, Flu A&B, Covid) Anterior Nasal Swab     Status: None   Collection Time: 11/26/24  1:30 PM   Specimen: Anterior Nasal Swab  Result Value Ref Range Status   SARS Coronavirus 2 by RT PCR NEGATIVE NEGATIVE Final    Comment: (NOTE) SARS-CoV-2 target nucleic acids are NOT DETECTED.  The SARS-CoV-2 RNA is generally detectable in upper respiratory specimens during the acute phase of infection. The lowest concentration of SARS-CoV-2 viral copies this assay can detect is 138 copies/mL. A negative result does not preclude SARS-Cov-2 infection and should not be used as the sole basis for treatment or other patient management decisions. A negative result may occur with  improper specimen collection/handling, submission of specimen  other than nasopharyngeal swab, presence of viral mutation(s) within the areas targeted by this assay, and inadequate number of viral copies(<138 copies/mL).  A negative result must be combined with clinical observations, patient history, and epidemiological information. The expected result is Negative.  Fact Sheet for Patients:  bloggercourse.com  Fact Sheet for Healthcare Providers:  seriousbroker.it  This test is no t yet approved or cleared by the [place]  FDA and  has been authorized for detection and/or diagnosis of SARS-CoV-2 by FDA under an Emergency Use Authorization (EUA). This EUA will remain  in effect (meaning this test can be used) for the duration of the COVID-19 declaration under Section 564(b)(1) of the Act, 21 U.S.C.section 360bbb-3(b)(1), unless the authorization is terminated  or revoked sooner.       Influenza A by PCR NEGATIVE NEGATIVE Final   Influenza B by PCR NEGATIVE NEGATIVE Final    Comment: (NOTE) The Xpert Xpress SARS-CoV-2/FLU/RSV plus assay is intended as an aid in the diagnosis of influenza from Nasopharyngeal swab specimens and should not be used as a sole basis for treatment. Nasal washings and aspirates are unacceptable for Xpert Xpress SARS-CoV-2/FLU/RSV testing.  Fact Sheet for Patients: bloggercourse.com  Fact Sheet for Healthcare Providers: seriousbroker.it  This test is not yet approved or cleared by the [place]  FDA and has been authorized for detection and/or diagnosis of SARS-CoV-2 by FDA under an Emergency Use Authorization (EUA). This EUA will remain in effect (meaning this test can be used) for the duration of the COVID-19 declaration under Section 564(b)(1) of the Act, 21 U.S.C. section 360bbb-3(b)(1), unless the authorization is terminated or revoked.     Resp Syncytial Virus by PCR NEGATIVE NEGATIVE Final    Comment: (NOTE) Fact Sheet for Patients: bloggercourse.com  Fact Sheet for Healthcare  Providers: seriousbroker.it  This test is not yet approved or cleared by the [place]  FDA and has been authorized for detection and/or diagnosis of SARS-CoV-2 by FDA under an Emergency Use Authorization (EUA). This EUA will remain in effect (meaning this test can be used) for the duration of the COVID-19 declaration under Section 564(b)(1) of the Act, 21 U.S.C. section 360bbb-3(b)(1), unless the authorization is terminated or revoked.  Performed at [facility], [address] [place], [place] [address]   Culture, blood (Routine x 2)     Status: None (Preliminary result)   Collection Time: 11/26/24  1:40 PM   Specimen: BLOOD  Result Value Ref Range Status   Specimen Description BLOOD BLOOD RIGHT HAND  Final   Special Requests   Final    BOTTLES DRAWN AEROBIC AND ANAEROBIC Blood Culture adequate volume   Culture   Final    NO GROWTH 4 DAYS Performed at [facility], [address], [place], [place] [address]    Report Status PENDING  Incomplete  MRSA Next Gen by PCR, Nasal     Status: None   Collection Time: 11/29/24  8:31 PM   Specimen: Nasal Mucosa; Nasal Swab  Result Value Ref Range Status   MRSA by PCR Next Gen NOT DETECTED NOT DETECTED Final    Comment: (NOTE) The GeneXpert MRSA Assay (FDA approved for NASAL specimens only), is one component of a comprehensive MRSA colonization surveillance program. It is not intended to diagnose MRSA infection nor to guide or monitor treatment for MRSA infections. Test performance is not FDA approved in patients less than 38 years old. Performed at [facility], [address], [place], [place] [address]  Radiology Studies: ECHOCARDIOGRAM COMPLETE BUBBLE STUDY Result Date: 12/02/2024    ECHOCARDIOGRAM REPORT   Patient Name:   CITLALI GAUTNEY Date of Exam: 12/02/2024 Medical Rec #:  [id number]       Height:       64.0 in Accession #:    [id number]      Weight:       229.3 lb  Date of Birth:  08/05/1976      BSA:          2.073 m Patient Age:    48 years        BP:           115/80 mmHg Patient Gender: F               HR:           122 bpm. Exam Location:  [facility] Procedure: 2D Echo, Cardiac Doppler, Color Doppler and Saline Contrast Bubble            Study (Both Spectral and Color Flow Doppler were utilized during            procedure). Indications:     Stroke  History:         Patient has prior history of Echocardiogram examinations, most                  recent 11/16/2024. Risk Factors:Hypertension. Breast Cancer.  Sonographer:     Philomena Daring Referring Phys:  [id number] NORVAL BAR Diagnosing Phys: Caron Poser IMPRESSIONS  1. Left ventricular ejection fraction, by estimation, is 50 to 55%. The left ventricle has low normal function. Left ventricular endocardial border not optimally defined to evaluate regional wall motion. There is mild left ventricular hypertrophy. Indeterminate diastolic filling due to E-A fusion.  2. Right ventricular systolic function is normal. The right ventricular size is normal. There is severely elevated pulmonary artery systolic pressure. The estimated right ventricular systolic pressure is 60.5 mmHg.  3. The mitral valve is grossly normal. Trivial mitral valve regurgitation. No evidence of mitral stenosis.  4. Tricuspid valve regurgitation is moderate.  5. The aortic valve has an indeterminant number of cusps. Aortic valve regurgitation is not visualized. No aortic stenosis is present.  6. The inferior vena cava is normal in size with greater than 50% respiratory variability, suggesting right atrial pressure of 3 mmHg.  7. Agitated saline contrast bubble study was positive with shunting observed within 3-6 cardiac cycles suggestive of interatrial shunt. Comparison(s): A prior study was performed on 11/16/2024. No significant change from prior study. FINDINGS  Left Ventricle: Left ventricular ejection fraction, by estimation, is 50 to 55%. The left ventricle  has low normal function. Left ventricular endocardial border not optimally defined to evaluate regional wall motion. The left ventricular internal cavity  size was normal in size. There is mild left ventricular hypertrophy. Indeterminate diastolic filling due to E-A fusion. Right Ventricle: The right ventricular size is normal. Right vetricular wall thickness was not well visualized. Right ventricular systolic function is normal. There is severely elevated pulmonary artery systolic pressure. The tricuspid regurgitant velocity is 3.79 m/s, and with an assumed right atrial pressure of 3 mmHg, the estimated right ventricular systolic pressure is 60.5 mmHg. Left Atrium: Left atrial size was normal in size. Right Atrium: Right atrial size was normal in size. Pericardium: There is no evidence of pericardial effusion. Presence of epicardial fat layer. Mitral Valve: The mitral valve is grossly normal. Trivial mitral valve regurgitation. No evidence of mitral  valve stenosis. Tricuspid Valve: The tricuspid valve is normal in structure. Tricuspid valve regurgitation is moderate . No evidence of tricuspid stenosis. Aortic Valve: The aortic valve has an indeterminant number of cusps. Aortic valve regurgitation is not visualized. No aortic stenosis is present. Pulmonic Valve: The pulmonic valve was not well visualized. Pulmonic valve regurgitation is mild. No evidence of pulmonic stenosis. Aorta: The aortic root and ascending aorta are structurally normal, with no evidence of dilitation. Venous: The inferior vena cava is normal in size with greater than 50% respiratory variability, suggesting right atrial pressure of 3 mmHg. IAS/Shunts: The interatrial septum was not well visualized. Agitated saline contrast was given intravenously to evaluate for intracardiac shunting. Agitated saline contrast bubble study was positive with shunting observed within 3-6 cardiac cycles suggestive of interatrial shunt.  LEFT VENTRICLE PLAX 2D  LVIDd:         2.90 cm   Diastology LVIDs:         2.10 cm   LV e' medial:    5.98 cm/s LV PW:         1.00 cm   LV E/e' medial:  9.3 LV IVS:        1.20 cm   LV e' lateral:   9.36 cm/s LVOT diam:     2.30 cm   LV E/e' lateral: 5.9 LV SV:         39 LV SV Index:   19 LVOT Area:     4.15 cm  RIGHT VENTRICLE RV S prime:     14.30 cm/s TAPSE (M-mode): 1.5 cm LEFT ATRIUM             Index       RIGHT ATRIUM          Index LA diam:        2.30 cm 1.11 cm/m  RA Area:     9.59 cm LA Vol (A2C):   16.8 ml 8.10 ml/m  RA Volume:   16.30 ml 7.86 ml/m LA Vol (A4C):   18.5 ml 8.92 ml/m LA Biplane Vol: 17.9 ml 8.63 ml/m  AORTIC VALVE LVOT Vmax:   78.30 cm/s LVOT Vmean:  53.700 cm/s LVOT VTI:    0.095 m  AORTA Ao Root diam: 2.70 cm MITRAL VALVE                TRICUSPID VALVE MV Area (PHT): 5.75 cm     TR Peak grad:   57.5 mmHg MV Decel Time: 132 msec     TR Vmax:        379.00 cm/s MV E velocity: 55.60 cm/s MV A velocity: 101.00 cm/s  SHUNTS MV E/A ratio:  0.55         Systemic VTI:  0.10 m                             Systemic Diam: 2.30 cm Caron Poser Electronically signed by Caron Poser Signature Date/Time: 12/02/2024/11:38:17 AM    Final     Scheduled Meds:  sodium chloride    Intravenous Once   sodium chloride    Intravenous Once   Chlorhexidine  Gluconate Cloth  6 each Topical Daily   feeding supplement  237 mL Oral BID BM   filgrastim  (NIVESTYM ) SQ  480 mcg Subcutaneous Q2000   levETIRAcetam   500 mg Oral BID   sodium chloride  flush  10-40 mL Intracatheter Q12H   Continuous Infusions:   LOS: 6 days  Norval Bar, MD  [facility]  12/03/2024, 11:31 AM   "

## 2024-12-03 NOTE — Plan of Care (Signed)
°  Problem: Education: °Goal: Knowledge of General Education information will improve °Description: Including pain rating scale, medication(s)/side effects and non-pharmacologic comfort measures °Outcome: Progressing °  °Problem: Health Behavior/Discharge Planning: °Goal: Ability to manage health-related needs will improve °Outcome: Progressing °  °Problem: Clinical Measurements: °Goal: Will remain free from infection °Outcome: Progressing °Goal: Respiratory complications will improve °Outcome: Progressing °Goal: Cardiovascular complication will be avoided °Outcome: Progressing °  °Problem: Activity: °Goal: Risk for activity intolerance will decrease °Outcome: Progressing °  °

## 2024-12-03 NOTE — Progress Notes (Signed)
 Family member is requesting one of the patients providers call the [employer] Ms. Ballard at [phone] so that the patients son who is incarcerated would be allowed to come see patient before she declines and passes away.  Son is named Goldie Tregoning inmate # [phone]

## 2024-12-04 DIAGNOSIS — A419 Sepsis, unspecified organism: Secondary | ICD-10-CM | POA: Diagnosis not present

## 2024-12-04 DIAGNOSIS — J189 Pneumonia, unspecified organism: Secondary | ICD-10-CM | POA: Diagnosis not present

## 2024-12-04 LAB — CBC
HCT: 19.6 % — ABNORMAL LOW (ref 36.0–46.0)
HCT: 23.5 % — ABNORMAL LOW (ref 36.0–46.0)
Hemoglobin: 6.8 g/dL — ABNORMAL LOW (ref 12.0–15.0)
Hemoglobin: 8.1 g/dL — ABNORMAL LOW (ref 12.0–15.0)
MCH: 23.8 pg — ABNORMAL LOW (ref 26.0–34.0)
MCH: 24.3 pg — ABNORMAL LOW (ref 26.0–34.0)
MCHC: 34.7 g/dL (ref 30.0–36.0)
MCHC: 34.5 g/dL (ref 30.0–36.0)
MCV: 68.5 fL — ABNORMAL LOW (ref 80.0–100.0)
MCV: 70.4 fL — ABNORMAL LOW (ref 80.0–100.0)
Platelets: 13 K/uL — CL (ref 150–400)
Platelets: 13 K/uL — CL (ref 150–400)
RBC: 2.86 MIL/uL — ABNORMAL LOW (ref 3.87–5.11)
RBC: 3.34 MIL/uL — ABNORMAL LOW (ref 3.87–5.11)
RDW: 21.8 % — ABNORMAL HIGH (ref 11.5–15.5)
RDW: 21.7 % — ABNORMAL HIGH (ref 11.5–15.5)
WBC: 0.5 K/uL — CL (ref 4.0–10.5)
WBC: 0.5 K/uL — CL (ref 4.0–10.5)
nRBC: 0 % (ref 0.0–0.2)
nRBC: 0 % (ref 0.0–0.2)

## 2024-12-04 LAB — BLOOD GAS, VENOUS
Acid-Base Excess: 0.8 mmol/L (ref 0.0–2.0)
Acid-base deficit: 5.4 mmol/L — ABNORMAL HIGH (ref 0.0–2.0)
Bicarbonate: 17 mmol/L — ABNORMAL LOW (ref 20.0–28.0)
Bicarbonate: 24.2 mmol/L (ref 20.0–28.0)
O2 Saturation: 77.7 %
O2 Saturation: 55 %
Patient temperature: 37
Patient temperature: 37
pCO2, Ven: 25 mmHg — ABNORMAL LOW (ref 44–60)
pCO2, Ven: 34 mmHg — ABNORMAL LOW (ref 44–60)
pH, Ven: 7.44 — ABNORMAL HIGH (ref 7.25–7.43)
pH, Ven: 7.46 — ABNORMAL HIGH (ref 7.25–7.43)
pO2, Ven: 49 mmHg — ABNORMAL HIGH (ref 32–45)
pO2, Ven: 34 mmHg (ref 32–45)

## 2024-12-04 LAB — COMPREHENSIVE METABOLIC PANEL WITH GFR
ALT: 373 U/L — ABNORMAL HIGH (ref 0–44)
ALT: 376 U/L — ABNORMAL HIGH (ref 0–44)
AST: 1015 U/L — ABNORMAL HIGH (ref 15–41)
AST: 1037 U/L — ABNORMAL HIGH (ref 15–41)
Albumin: 2.6 g/dL — ABNORMAL LOW (ref 3.5–5.0)
Albumin: 2.5 g/dL — ABNORMAL LOW (ref 3.5–5.0)
Alkaline Phosphatase: 489 U/L — ABNORMAL HIGH (ref 38–126)
Alkaline Phosphatase: 476 U/L — ABNORMAL HIGH (ref 38–126)
Anion gap: 22 — ABNORMAL HIGH (ref 5–15)
Anion gap: 20 — ABNORMAL HIGH (ref 5–15)
BUN: 13 mg/dL (ref 6–20)
BUN: 14 mg/dL (ref 6–20)
CO2: 17 mmol/L — ABNORMAL LOW (ref 22–32)
CO2: 21 mmol/L — ABNORMAL LOW (ref 22–32)
Calcium: 8.5 mg/dL — ABNORMAL LOW (ref 8.9–10.3)
Calcium: 8.1 mg/dL — ABNORMAL LOW (ref 8.9–10.3)
Chloride: 95 mmol/L — ABNORMAL LOW (ref 98–111)
Chloride: 97 mmol/L — ABNORMAL LOW (ref 98–111)
Creatinine, Ser: 0.66 mg/dL (ref 0.44–1.00)
Creatinine, Ser: 0.64 mg/dL (ref 0.44–1.00)
GFR, Estimated: >60 mL/min
GFR, Estimated: >60 mL/min
Glucose, Bld: 99 mg/dL (ref 70–99)
Glucose, Bld: 103 mg/dL — ABNORMAL HIGH (ref 70–99)
Potassium: 5 mmol/L (ref 3.5–5.1)
Potassium: 4.5 mmol/L (ref 3.5–5.1)
Sodium: 134 mmol/L — ABNORMAL LOW (ref 135–145)
Sodium: 137 mmol/L (ref 135–145)
Total Bilirubin: 3.1 mg/dL — ABNORMAL HIGH (ref 0.0–1.2)
Total Bilirubin: 3.8 mg/dL — ABNORMAL HIGH (ref 0.0–1.2)
Total Protein: 5.4 g/dL — ABNORMAL LOW (ref 6.5–8.1)
Total Protein: 5.1 g/dL — ABNORMAL LOW (ref 6.5–8.1)

## 2024-12-04 LAB — LACTIC ACID, PLASMA
Lactic Acid, Venous: >9 mmol/L (ref 0.5–1.9)
Lactic Acid, Venous: 8.1 mmol/L (ref 0.5–1.9)

## 2024-12-04 LAB — PREPARE PLATELET PHERESIS
Unit division: 0
Unit division: 0

## 2024-12-04 LAB — BPAM PLATELET PHERESIS
Blood Product Expiration Date: 202601122359
Blood Product Expiration Date: 202601122359
ISSUE DATE / TIME: 202601101122
ISSUE DATE / TIME: 202601101503
Unit Type and Rh: 5100
Unit Type and Rh: 5100

## 2024-12-04 LAB — PREPARE RBC (CROSSMATCH)

## 2024-12-04 MED ORDER — SODIUM BICARBONATE 8.4 % IV SOLN
INTRAVENOUS | Status: DC
  Filled 2024-12-04 (×6): qty 1000

## 2024-12-04 MED ORDER — LACTATED RINGERS IV BOLUS
1000.0000 mL | Freq: Once | INTRAVENOUS | Status: AC
  Administered 2024-12-04: 1000 mL via INTRAVENOUS

## 2024-12-04 MED ORDER — SODIUM CHLORIDE 0.9% FLUSH: 10.0000 mL | INTRAVENOUS | Status: DC | PRN

## 2024-12-04 MED ORDER — SODIUM CHLORIDE 0.9% FLUSH
10.0000 mL | Freq: Two times a day (BID) | INTRAVENOUS | Status: DC
  Administered 2024-12-04 – 2024-12-06 (×4): 10 mL

## 2024-12-04 MED ORDER — SODIUM CHLORIDE 0.9% IV SOLUTION: Freq: Once | INTRAVENOUS | Status: AC

## 2024-12-04 NOTE — Progress Notes (Addendum)
 " PROGRESS NOTE    Tara Hanna  FMW:[id number] DOB: [birth year] DOA: 11/26/2024 PCP: System, Provider Not In  Subjective: No acute events overnight. Informed by nursing of slow decline in BP over the last 24 hours. Seen and examined at bedside. Reports no new complaints. Denies any signs/symptoms of acute bleeding. Denies nausea, vomiting, constipation.   Hospital Course: 49 y.o. female with medical history of HTN, metastatic breast cancer with metastasis to liver, brain and bone on chemotherapy (last chemo about 4 days prior to admission). Breast cancer was diagnosed in 2023 and she has recurrent and metastatic disease on palliative chemotherapy with doxorubicin  every 28 days. Last chemo was on 11/22/2024. She presented to the hospital with shortness of breath, abdominal and back pain. She was diagnosed with possible severe sepsis secondary to community-acquired pneumonia.    Assessment and Plan:  Metastatic breast cancer  Metastatic disease to the liver, brain, bone:  Continue Keppra  for seizure prophylaxis. S/p 1 cycle of Doxil  on 11/22/2024 Patient is not a good candidate for any metastatic cancer treatments as per oncology  Patient/family still interested in radiation therapy for brain metastasis  Patient/family want second opinion Unfortunately, [facility] does not have any available beds for patient to be transferred for oncology evaluation and second opinion. Patient remains severely ill to be able to get discharged and taken to a tertiary care center at this time Dr. Lenn, radiation oncologist, following Oncology following  Lactic acidosis Acute metabolic acidosis Persistent, Progressive Likely in the setting of metastatic liver disease with impaired function/clearance S/p treatment with IV fluids earlier during admission. VBG compensated at present but serum bicarb decreasing Will start IV sodium bicarbonate  Will trend lactate  Hypertension:  HCTZ on hold  Concern for  impending cardiac arrest in the setting of likely worsening acute metabolic acidosis Will give IV fluid bolus Monitor vitals closely Lactate is unreliable marker of tissue hypoperfusion in this patient with known metastatic liver disease May need higher level of care if MAPs persistently below 65 Monitor vitals closely   Anemia of chronic disease Pancytopenia:  Possibly due to malignancy. Recent chemotherapy likely contributed to pancytopenia Recent PNA may have also contributed a little but less likely given no improvement in counts despite antibiotic course WBC 0.5 < 2.7 Hemoglobin 6.9 < 8.9  Platelets 13 < 7 Iron studies inconsistent with iron deficiency anemia.  Ferritin level greater than 7500, saturation ratio 49, iron level 107. Status post 5 units platelets already Will give another 1 unit pRBCs and 1 unit platelets  Oncology following   Acute embolic stroke Metastatic brain disease  Noted on MRI brain 11/29/24 CTA head/neck 11/30/24 with no LVO or significant stenosis in head/neck Venous US  bilateral arms and legs with no acute DVT TTE w/ bubble study positive for PFO Severe thrombocytopenia cotinues to make use of antiplatelets/anticoagulation and consideration for PFO closure a challenge Cont keppra  as elsewhere for seizure prophylaxis SLP signed off PT/OT following Neurology following Hematology/oncology following   Severe sepsis  Community-acquired pneumonia:  Bcx 1/3 with NGTD, final read pending MRSA screen is negative S/p treatment with IV fluids. Status post 5-day course on antibiotics, finished on 11/30/24   Acute hypoxic respiratory failure:  Currently on 3 < 1L oxygen via [place], not on oxygen at home Likely in the setting of acute CAP See management of PNA as elsewhere OOB to chair during daytime Incentive spirometry Wean oxygen as tolerated   Sinus tachycardia:  Persistent in nature Likely from underlying acute illness.  Suspect anxiety could be  contributing to this.  Chart review shows she was tachycardic with heart rate of 126 at her oncologist office on 11/22/2024. Monitor clinically     Elevated liver enzymes:  Likely due to metastatic liver disease. Monitor clinically   Anxiety At home on xanax  TID PRN Cont as BID PRN for now   Hyponatremia: Improved.  Advance care planning: Discussed poor prognosis with patient and husband at bedside. Patient/family appreciate the medical update and want to continue with aggressive care at present without any limitations. I spent a total of 10 minutes discussing goals of care with patient/family at bedside today. Discussed case with palliative care team who will try to drop by for ongoing goals of care discussions  DVT prophylaxis: Place and maintain sequential compression device Start: 11/29/24 0851 SCDs Start: 11/26/24 1706  SCDs   Code Status: Full Code Family Communication: updated husband at bedside Disposition Plan: TBD Reason for continuing need for hospitalization: severity of illness  Objective: Vitals:   12/04/24 1032 12/04/24 1047 12/04/24 1159 12/04/24 1200  BP: (!) 85/61 99/73 (!) 92/56 91/64  Pulse: (!) 122 (!) 121  (!) 122  Resp: 14 15 (!) 24 19  Temp: 99.5 F (37.5 C) 99.4 F (37.4 C) 98.2 F (36.8 C) 98.2 F (36.8 C)  TempSrc: Oral  Oral   SpO2: 96%  93% 96%  Weight:      Height:        Intake/Output Summary (Last 24 hours) at 12/04/2024 1251 Last data filed at 12/04/2024 9781 Gross per 24 hour  Intake 2182 ml  Output 100 ml  Net 2082 ml   Filed Weights   12/02/24 0500 12/03/24 0411 12/04/24 0500  Weight: 104 kg 103.7 kg 103.1 kg    Examination:  Physical Exam Vitals and nursing note reviewed.  Constitutional:      General: She is not in acute distress.    Appearance: She is ill-appearing.  HENT:     Head: Normocephalic and atraumatic.  Cardiovascular:     Rate and Rhythm: Normal rate and regular rhythm.     Pulses: Normal pulses.      Heart sounds: Normal heart sounds.  Pulmonary:     Effort: Pulmonary effort is normal.     Breath sounds: Normal breath sounds.  Abdominal:     General: Bowel sounds are normal. There is no distension.     Palpations: Abdomen is soft.     Tenderness: There is no abdominal tenderness.  Neurological:     Mental Status: She is alert.     Data Reviewed: I have personally reviewed following labs and imaging studies  CBC: Recent Labs  Lab 11/29/24 0559 11/30/24 0541 11/30/24 0834 11/30/24 1617 12/01/24 0412 12/02/24 0449 12/02/24 2012 12/03/24 0606 12/04/24 0516 12/04/24 0816  WBC 2.8*   < > 2.8*   < > 2.6* 1.5* 1.0* 1.0* 0.5* 0.5*  NEUTROABS 2.3  --  2.3  --  2.1  --  0.8*  --   --  0.2*  HGB 8.4*   < > 8.5*   < > 8.7* 8.0* 7.5* 7.3* 6.8* 7.2*  HCT 25.7*   < > 26.0*   < > 26.2* 24.2* 22.0* 21.2* 19.6* 21.0*  MCV 74.1*   < > 73.4*   < > 73.4* 71.6* 70.5* 69.7* 68.5* 69.8*  PLT 26*   < > 10*   < > 12* 11* 15* 7* 13* 10*   < > = values in this  interval not displayed.   Basic Metabolic Panel: Recent Labs  Lab 11/29/24 0559 11/30/24 0541 11/30/24 0834 12/01/24 0412 12/02/24 0449 12/02/24 2013 12/04/24 0516  NA 137   < > 135 135 137 135 134*  K 3.8   < > 4.8 4.6 5.0 4.9 5.0  CL 101   < > 99 98 98 96* 95*  CO2 24   < > 21* 20* 22 20* 17*  GLUCOSE 97   < > 162* 112* 102* 157* 99  BUN 7   < > 6 6 7 10 13   CREATININE 0.57   < > 0.57 0.63 0.67 0.61 0.66  CALCIUM 8.3*   < > 7.9* 8.1* 8.2* 8.1* 8.5*  MG 1.8  --   --   --   --   --   --   PHOS 1.9*  --   --   --   --   --   --    < > = values in this interval not displayed.   GFR: Estimated Creatinine Clearance: 99.5 mL/min (by C-G formula based on SCr of 0.66 mg/dL). Liver Function Tests: Recent Labs  Lab 11/28/24 0555 11/29/24 0559 11/30/24 0541 12/02/24 0449 12/04/24 0516  AST 558*  --  486* 663* 1,015*  ALT 452*  --  362* 341* 373*  ALKPHOS 531*  --  515* 467* 489*  BILITOT 1.0  --  1.2 1.5* 3.1*  PROT 5.9*   --  5.8* 5.4* 5.4*  ALBUMIN 2.7* 2.6* 2.6* 2.5* 2.6*   No results for input(s): LIPASE, AMYLASE in the last 168 hours. No results for input(s): AMMONIA in the last 168 hours. Coagulation Profile: No results for input(s): INR, PROTIME in the last 168 hours. Cardiac Enzymes: No results for input(s): CKTOTAL, CKMB, CKMBINDEX, TROPONINI in the last 168 hours. ProBNP, BNP (last 5 results) No results for input(s): PROBNP, BNP in the last 8760 hours. HbA1C: No results for input(s): HGBA1C in the last 72 hours. CBG: Recent Labs  Lab 11/28/24 1600 11/28/24 2014 11/29/24 0820 11/29/24 1135 11/29/24 1706  GLUCAP 145* 117* 108* 96 79   Lipid Profile: No results for input(s): CHOL, HDL, LDLCALC, TRIG, CHOLHDL, LDLDIRECT in the last 72 hours. Thyroid  Function Tests: No results for input(s): TSH, T4TOTAL, FREET4, T3FREE, THYROIDAB in the last 72 hours. Anemia Panel: No results for input(s): VITAMINB12, FOLATE, FERRITIN, TIBC, IRON, RETICCTPCT in the last 72 hours. Sepsis Labs: Recent Labs  Lab 11/29/24 0100 11/29/24 0559 12/01/24 0412 12/04/24 0516  LATICACIDVEN 2.9* 3.3* 6.4* >9.0*    Recent Results (from the past 240 hours)  Culture, blood (Routine x 2)     Status: None (Preliminary result)   Collection Time: 11/26/24  1:01 PM   Specimen: BLOOD  Result Value Ref Range Status   Specimen Description BLOOD RIGHT ANTECUBITAL  Final   Special Requests   Final    BOTTLES DRAWN AEROBIC AND ANAEROBIC Blood Culture results may not be optimal due to an inadequate volume of blood received in culture bottles   Culture   Final    NO GROWTH 4 DAYS Performed at [facility], [address] [place], [place] [address]    Report Status PENDING  Incomplete  Resp panel by RT-PCR (RSV, Flu A&B, Covid) Anterior Nasal Swab     Status: None   Collection Time: 11/26/24  1:30 PM   Specimen: Anterior Nasal Swab  Result Value Ref  Range Status   SARS Coronavirus 2 by RT PCR NEGATIVE  NEGATIVE Final    Comment: (NOTE) SARS-CoV-2 target nucleic acids are NOT DETECTED.  The SARS-CoV-2 RNA is generally detectable in upper respiratory specimens during the acute phase of infection. The lowest concentration of SARS-CoV-2 viral copies this assay can detect is 138 copies/mL. A negative result does not preclude SARS-Cov-2 infection and should not be used as the sole basis for treatment or other patient management decisions. A negative result may occur with  improper specimen collection/handling, submission of specimen other than nasopharyngeal swab, presence of viral mutation(s) within the areas targeted by this assay, and inadequate number of viral copies(<138 copies/mL). A negative result must be combined with clinical observations, patient history, and epidemiological information. The expected result is Negative.  Fact Sheet for Patients:  bloggercourse.com  Fact Sheet for Healthcare Providers:  seriousbroker.it  This test is no t yet approved or cleared by the [place]  FDA and  has been authorized for detection and/or diagnosis of SARS-CoV-2 by FDA under an Emergency Use Authorization (EUA). This EUA will remain  in effect (meaning this test can be used) for the duration of the COVID-19 declaration under Section 564(b)(1) of the Act, 21 U.S.C.section 360bbb-3(b)(1), unless the authorization is terminated  or revoked sooner.       Influenza A by PCR NEGATIVE NEGATIVE Final   Influenza B by PCR NEGATIVE NEGATIVE Final    Comment: (NOTE) The Xpert Xpress SARS-CoV-2/FLU/RSV plus assay is intended as an aid in the diagnosis of influenza from Nasopharyngeal swab specimens and should not be used as a sole basis for treatment. Nasal washings and aspirates are unacceptable for Xpert Xpress SARS-CoV-2/FLU/RSV testing.  Fact Sheet for  Patients: bloggercourse.com  Fact Sheet for Healthcare Providers: seriousbroker.it  This test is not yet approved or cleared by the [place]  FDA and has been authorized for detection and/or diagnosis of SARS-CoV-2 by FDA under an Emergency Use Authorization (EUA). This EUA will remain in effect (meaning this test can be used) for the duration of the COVID-19 declaration under Section 564(b)(1) of the Act, 21 U.S.C. section 360bbb-3(b)(1), unless the authorization is terminated or revoked.     Resp Syncytial Virus by PCR NEGATIVE NEGATIVE Final    Comment: (NOTE) Fact Sheet for Patients: bloggercourse.com  Fact Sheet for Healthcare Providers: seriousbroker.it  This test is not yet approved or cleared by the [place]  FDA and has been authorized for detection and/or diagnosis of SARS-CoV-2 by FDA under an Emergency Use Authorization (EUA). This EUA will remain in effect (meaning this test can be used) for the duration of the COVID-19 declaration under Section 564(b)(1) of the Act, 21 U.S.C. section 360bbb-3(b)(1), unless the authorization is terminated or revoked.  Performed at [facility], [address] [place], [place] [address]   Culture, blood (Routine x 2)     Status: None (Preliminary result)   Collection Time: 11/26/24  1:40 PM   Specimen: BLOOD  Result Value Ref Range Status   Specimen Description BLOOD BLOOD RIGHT HAND  Final   Special Requests   Final    BOTTLES DRAWN AEROBIC AND ANAEROBIC Blood Culture adequate volume   Culture   Final    NO GROWTH 4 DAYS Performed at [facility], [address], [place], [place] [address]    Report Status PENDING  Incomplete  MRSA Next Gen by PCR, Nasal     Status: None   Collection Time: 11/29/24  8:31 PM   Specimen: Nasal Mucosa; Nasal Swab  Result Value Ref Range Status  MRSA by PCR Next Gen  NOT DETECTED NOT DETECTED Final    Comment: (NOTE) The GeneXpert MRSA Assay (FDA approved for NASAL specimens only), is one component of a comprehensive MRSA colonization surveillance program. It is not intended to diagnose MRSA infection nor to guide or monitor treatment for MRSA infections. Test performance is not FDA approved in patients less than 45 years old. Performed at [facility], [address] [place], [place] [address]      Radiology Studies: No results found.  Scheduled Meds:  sodium chloride    Intravenous Once   sodium chloride    Intravenous Once   Chlorhexidine  Gluconate Cloth  6 each Topical Daily   feeding supplement  237 mL Oral BID BM   filgrastim  (NIVESTYM ) SQ  480 mcg Subcutaneous Q2000   levETIRAcetam   500 mg Oral BID   sodium chloride  flush  10-40 mL Intracatheter Q12H   Continuous Infusions:  lactated ringers      sodium bicarbonate  150 mEq in dextrose  5 % 1,150 mL infusion       LOS: 7 days   Norval Bar, MD  [facility]  12/04/2024, 12:51 PM   "

## 2024-12-04 NOTE — Progress Notes (Signed)
 Lactic acid this morning is critical at 9.0, last was 6.4 on 12/01/24.  Dr Lawence messaged to be made aware.

## 2024-12-04 NOTE — Plan of Care (Signed)
  Problem: Education: Goal: Knowledge of General Education information will improve Description: Including pain rating scale, medication(s)/side effects and non-pharmacologic comfort measures Outcome: Progressing   Problem: Health Behavior/Discharge Planning: Goal: Ability to manage health-related needs will improve Outcome: Progressing   Problem: Clinical Measurements: Goal: Ability to maintain clinical measurements within normal limits will improve Outcome: Progressing Goal: Will remain free from infection Outcome: Progressing Goal: Diagnostic test results will improve Outcome: Progressing Goal: Respiratory complications will improve Outcome: Progressing Goal: Cardiovascular complication will be avoided Outcome: Progressing   Problem: Activity: Goal: Risk for activity intolerance will decrease Outcome: Progressing   Problem: Nutrition: Goal: Adequate nutrition will be maintained Outcome: Progressing   Problem: Coping: Goal: Level of anxiety will decrease Outcome: Progressing   Problem: Elimination: Goal: Will not experience complications related to bowel motility Outcome: Progressing Goal: Will not experience complications related to urinary retention Outcome: Progressing   Problem: Pain Managment: Goal: General experience of comfort will improve and/or be controlled Outcome: Progressing   Problem: Safety: Goal: Ability to remain free from injury will improve Outcome: Progressing   Problem: Skin Integrity: Goal: Risk for impaired skin integrity will decrease Outcome: Progressing   Problem: Education: Goal: Knowledge of disease or condition will improve Outcome: Progressing Goal: Knowledge of secondary prevention will improve (MUST DOCUMENT ALL) Outcome: Progressing Goal: Knowledge of patient specific risk factors will improve (DELETE if not current risk factor) Outcome: Progressing   Problem: Coping: Goal: Will verbalize positive feelings about  self Outcome: Progressing Goal: Will identify appropriate support needs Outcome: Progressing   Problem: Health Behavior/Discharge Planning: Goal: Ability to manage health-related needs will improve Outcome: Progressing Goal: Goals will be collaboratively established with patient/family Outcome: Progressing   Problem: Self-Care: Goal: Ability to participate in self-care as condition permits will improve Outcome: Progressing Goal: Verbalization of feelings and concerns over difficulty with self-care will improve Outcome: Progressing Goal: Ability to communicate needs accurately will improve Outcome: Progressing   Problem: Nutrition: Goal: Risk of aspiration will decrease Outcome: Progressing Goal: Dietary intake will improve Outcome: Progressing

## 2024-12-04 NOTE — Progress Notes (Signed)

## 2024-12-04 NOTE — Progress Notes (Signed)
" °  Chaplain On-Call responded to Spiritual Care Consult Order from Norval Bar, MD. The request was for Advance Directives for the patient, with an entry reading Needs Notary.  Chaplain met the patient and two family members, and provided the AD documents for the patient. The patient stated that her husband is expected to come in to visit later today, and that she will discuss the AD information with him.  Chaplain assured patient and family of availability as needed.  Chaplain Bebe Ardean EMERSON Hershal., BCC    "

## 2024-12-04 NOTE — Progress Notes (Signed)
 Patient refused VBG per lab. Dr. Cosette made aware. Lab reported they will try again later in the evening.

## 2024-12-04 NOTE — Progress Notes (Signed)
 At bedside for PIV placement.  Pt up on bedside commode.Will return

## 2024-12-05 ENCOUNTER — Ambulatory Visit: Admitting: Radiation Oncology

## 2024-12-05 ENCOUNTER — Inpatient Hospital Stay

## 2024-12-05 ENCOUNTER — Ambulatory Visit: Attending: Radiation Oncology | Admitting: Radiation Oncology

## 2024-12-05 ENCOUNTER — Ambulatory Visit

## 2024-12-05 DIAGNOSIS — J189 Pneumonia, unspecified organism: Secondary | ICD-10-CM | POA: Diagnosis not present

## 2024-12-05 DIAGNOSIS — I10 Essential (primary) hypertension: Secondary | ICD-10-CM | POA: Diagnosis not present

## 2024-12-05 DIAGNOSIS — Z7189 Other specified counseling: Secondary | ICD-10-CM | POA: Diagnosis not present

## 2024-12-05 DIAGNOSIS — D702 Other drug-induced agranulocytosis: Secondary | ICD-10-CM | POA: Diagnosis not present

## 2024-12-05 DIAGNOSIS — D696 Thrombocytopenia, unspecified: Secondary | ICD-10-CM | POA: Diagnosis not present

## 2024-12-05 DIAGNOSIS — Z515 Encounter for palliative care: Secondary | ICD-10-CM | POA: Diagnosis not present

## 2024-12-05 DIAGNOSIS — C50911 Malignant neoplasm of unspecified site of right female breast: Secondary | ICD-10-CM | POA: Diagnosis not present

## 2024-12-05 DIAGNOSIS — A419 Sepsis, unspecified organism: Secondary | ICD-10-CM | POA: Diagnosis not present

## 2024-12-05 DIAGNOSIS — Z789 Other specified health status: Secondary | ICD-10-CM | POA: Diagnosis not present

## 2024-12-05 DIAGNOSIS — C7931 Secondary malignant neoplasm of brain: Secondary | ICD-10-CM | POA: Diagnosis not present

## 2024-12-05 DIAGNOSIS — C7981 Secondary malignant neoplasm of breast: Secondary | ICD-10-CM | POA: Diagnosis not present

## 2024-12-05 LAB — PATHOLOGIST SMEAR REVIEW

## 2024-12-05 LAB — LACTIC ACID, PLASMA
Lactic Acid, Venous: 4.4 mmol/L (ref 0.5–1.9)
Lactic Acid, Venous: 7.7 mmol/L (ref 0.5–1.9)
Lactic Acid, Venous: >9 mmol/L (ref 0.5–1.9)

## 2024-12-05 LAB — BLOOD GAS, VENOUS
Acid-Base Excess: 2.2 mmol/L — ABNORMAL HIGH (ref 0.0–2.0)
Acid-Base Excess: 0.9 mmol/L (ref 0.0–2.0)
Bicarbonate: 25.3 mmol/L (ref 20.0–28.0)
Bicarbonate: 23.8 mmol/L (ref 20.0–28.0)
O2 Saturation: 56.5 %
O2 Saturation: 56.5 %
Patient temperature: 37
Patient temperature: 37
pCO2, Ven: 34 mmHg — ABNORMAL LOW (ref 44–60)
pCO2, Ven: 32 mmHg — ABNORMAL LOW (ref 44–60)
pH, Ven: 7.48 — ABNORMAL HIGH (ref 7.25–7.43)
pH, Ven: 7.48 — ABNORMAL HIGH (ref 7.25–7.43)
pO2, Ven: 37 mmHg (ref 32–45)
pO2, Ven: 37 mmHg (ref 32–45)

## 2024-12-05 LAB — CULTURE, BLOOD (ROUTINE X 2)
Culture: NO GROWTH
Culture: NO GROWTH
Special Requests: ADEQUATE

## 2024-12-05 LAB — CBC WITH DIFFERENTIAL/PLATELET
Abs Immature Granulocytes: 0.03 K/uL (ref 0.00–0.07)
Basophils Absolute: 0 K/uL (ref 0.0–0.1)
Basophils Relative: 0 %
Eosinophils Absolute: 0 K/uL (ref 0.0–0.5)
Eosinophils Relative: 0 %
HCT: 21 % — ABNORMAL LOW (ref 36.0–46.0)
Hemoglobin: 7.2 g/dL — ABNORMAL LOW (ref 12.0–15.0)
Immature Granulocytes: 7 %
Lymphocytes Relative: 47 %
Lymphs Abs: 0.2 K/uL — ABNORMAL LOW (ref 0.7–4.0)
MCH: 23.9 pg — ABNORMAL LOW (ref 26.0–34.0)
MCHC: 34.3 g/dL (ref 30.0–36.0)
MCV: 69.8 fL — ABNORMAL LOW (ref 80.0–100.0)
Monocytes Absolute: 0 K/uL — ABNORMAL LOW (ref 0.1–1.0)
Monocytes Relative: 4 %
Neutro Abs: 0.2 K/uL — CL (ref 1.7–7.7)
Neutrophils Relative %: 42 %
Platelets: 10 K/uL — CL (ref 150–400)
RBC: 3.01 MIL/uL — ABNORMAL LOW (ref 3.87–5.11)
RDW: 22.6 % — ABNORMAL HIGH (ref 11.5–15.5)
Smear Review: NORMAL
WBC: 0.5 K/uL — CL (ref 4.0–10.5)
nRBC: 0 % (ref 0.0–0.2)

## 2024-12-05 LAB — PREPARE PLATELET PHERESIS: Unit division: 0

## 2024-12-05 LAB — BASIC METABOLIC PANEL WITH GFR
Anion gap: 21 — ABNORMAL HIGH (ref 5–15)
Anion gap: 21 — ABNORMAL HIGH (ref 5–15)
BUN: 15 mg/dL (ref 6–20)
BUN: 18 mg/dL (ref 6–20)
CO2: 21 mmol/L — ABNORMAL LOW (ref 22–32)
CO2: 19 mmol/L — ABNORMAL LOW (ref 22–32)
Calcium: 8.2 mg/dL — ABNORMAL LOW (ref 8.9–10.3)
Calcium: 8.4 mg/dL — ABNORMAL LOW (ref 8.9–10.3)
Chloride: 96 mmol/L — ABNORMAL LOW (ref 98–111)
Chloride: 97 mmol/L — ABNORMAL LOW (ref 98–111)
Creatinine, Ser: 0.7 mg/dL (ref 0.44–1.00)
Creatinine, Ser: 0.81 mg/dL (ref 0.44–1.00)
GFR, Estimated: >60 mL/min
GFR, Estimated: >60 mL/min
Glucose, Bld: 112 mg/dL — ABNORMAL HIGH (ref 70–99)
Glucose, Bld: 116 mg/dL — ABNORMAL HIGH (ref 70–99)
Potassium: 4.3 mmol/L (ref 3.5–5.1)
Potassium: 4.7 mmol/L (ref 3.5–5.1)
Sodium: 138 mmol/L (ref 135–145)
Sodium: 137 mmol/L (ref 135–145)

## 2024-12-05 LAB — CBC
HCT: 24.4 % — ABNORMAL LOW (ref 36.0–46.0)
Hemoglobin: 8.5 g/dL — ABNORMAL LOW (ref 12.0–15.0)
MCH: 24.4 pg — ABNORMAL LOW (ref 26.0–34.0)
MCHC: 34.8 g/dL (ref 30.0–36.0)
MCV: 70.1 fL — ABNORMAL LOW (ref 80.0–100.0)
Platelets: 12 K/uL — CL (ref 150–400)
RBC: 3.48 MIL/uL — ABNORMAL LOW (ref 3.87–5.11)
RDW: 21.8 % — ABNORMAL HIGH (ref 11.5–15.5)
WBC: 0.5 K/uL — CL (ref 4.0–10.5)
nRBC: 4.4 % — ABNORMAL HIGH (ref 0.0–0.2)

## 2024-12-05 LAB — BPAM RBC
Blood Product Expiration Date: 202602022359
ISSUE DATE / TIME: 202601111344
Unit Type and Rh: 5100

## 2024-12-05 LAB — TYPE AND SCREEN
ABO/RH(D): O POS
Antibody Screen: NEGATIVE
Unit division: 0

## 2024-12-05 LAB — BPAM PLATELET PHERESIS
Blood Product Expiration Date: 202601132359
ISSUE DATE / TIME: 202601111020
Unit Type and Rh: 5100

## 2024-12-05 LAB — GLUCOSE, CAPILLARY: Glucose-Capillary: 100 mg/dL — ABNORMAL HIGH (ref 70–99)

## 2024-12-05 MED ORDER — FUROSEMIDE 10 MG/ML IJ SOLN
20.0000 mg | Freq: Once | INTRAMUSCULAR | Status: AC
  Administered 2024-12-05: 20 mg via INTRAVENOUS
  Filled 2024-12-05: qty 2

## 2024-12-05 MED ORDER — METOPROLOL TARTRATE 25 MG PO TABS
12.5000 mg | ORAL_TABLET | Freq: Four times a day (QID) | ORAL | Status: DC
  Administered 2024-12-05 – 2024-12-06 (×3): 12.5 mg via ORAL
  Filled 2024-12-05 (×3): qty 1

## 2024-12-05 MED ORDER — SODIUM CHLORIDE 0.9% IV SOLUTION: Freq: Once | INTRAVENOUS | Status: AC

## 2024-12-05 MED ORDER — DIGOXIN 0.25 MG/ML IJ SOLN
0.2500 mg | Freq: Once | INTRAMUSCULAR | Status: AC
  Administered 2024-12-05: 0.25 mg via INTRAVENOUS
  Filled 2024-12-05: qty 2

## 2024-12-05 MED ORDER — SODIUM CHLORIDE 0.9 % IV BOLUS
250.0000 mL | Freq: Once | INTRAVENOUS | Status: AC
  Administered 2024-12-05: 250 mL via INTRAVENOUS

## 2024-12-05 NOTE — Progress Notes (Signed)
 " PROGRESS NOTE    Tara Hanna  FMW:[id number] DOB: [birth year] DOA: 11/26/2024 PCP: System, Provider Not In  Subjective: No acute events overnight. Seen and examined at bedside. Appears sleepy and states didn't get much rest last night. Reports no acute complaints. Denies any signs/symptoms of acute bleeding. Denies nausea, vomiting, constipation.    Hospital Course: 49 y.o. female with medical history of HTN, metastatic breast cancer with metastasis to liver, brain and bone on chemotherapy (last chemo about 4 days prior to admission). Breast cancer was diagnosed in 2023 and she has recurrent and metastatic disease on palliative chemotherapy with doxorubicin  every 28 days. Last chemo was on 11/22/2024. She presented to the hospital with shortness of breath, abdominal and back pain. She was diagnosed with possible severe sepsis secondary to community-acquired pneumonia.    Assessment and Plan:  Metastatic breast cancer  Metastatic disease to the liver, brain, bone:  Continue Keppra  for seizure prophylaxis. S/p 1 cycle of Doxil  on 11/22/2024 Patient is not a good candidate for any metastatic cancer treatments as per oncology and radiation oncology teams Patient/family still interested in radiation therapy for brain metastasis  Patient/family want second opinion Unfortunately, [facility] does not have any available beds for patient to be transferred for oncology evaluation and second opinion. Patient remains severely ill to be able to get discharged and taken to a tertiary care center at this time Dr. Lenn, radiation oncologist, following Oncology following   Lactic acidosis Acute metabolic acidosis Persistent, Progressive Likely in the setting of metastatic liver disease with impaired function/clearance S/p treatment with IV fluids earlier during admission. VBG compensated at present but serum bicarb decreasing Cont IV sodium bicarbonate  Will trend lactate   Tachycardia Patient with  intermittent shortness of breath and worsening hypoxia HR in 110 -140s Tele with NSR and occasional PVCs Will start on metoprolol  12.5mg  q6h with holding parameters See management of metabolic acidosis as elsewhere Monitor on tele  Shortness of breath Acute hypoxic respiratory failure:  Currently on 3 < 1L oxygen via [place], not on oxygen at home Initially in the setting of acute CAP. Now concern for impending fluid overload state due to continuous blood transfusions and tachycardia BP remains low at this time Will give lasix  20mg  IV once OOB to chair during daytime Incentive spirometry Wean oxygen as tolerated Monitor BP closely  Hypotension:  HCTZ on hold  Concern for impending cardiac arrest in the setting of likely worsening acute metabolic acidosis Given IV fluid bolus on 1/11 Hold off on further IV fluids given possible crackles on lung exam 1/12 Monitor vitals closely Lactate is unreliable marker of tissue hypoperfusion in this patient with known metastatic liver disease May need higher level of care if MAPs persistently below 65 Monitor vitals closely   Anemia of chronic disease Pancytopenia:  Possibly due to malignancy. Recent chemotherapy likely contributed to pancytopenia Recent PNA may have also contributed a little but less likely given no improvement in counts despite antibiotic course WBC 0.5 < 2.7 Hemoglobin 8.5 < 8.1  Platelets 12 < 13 Iron studies inconsistent with iron deficiency anemia.  Ferritin level greater than 7500, saturation ratio 49, iron level 107. Status post 6 units platelets and 1 unit pRBC already Will give another 1 unit platelets  Oncology following   Acute embolic stroke Metastatic brain disease  Noted on MRI brain 11/29/24 CTA head/neck 11/30/24 with no LVO or significant stenosis in head/neck Venous US  bilateral arms and legs with no acute DVT TTE w/ bubble study  positive for PFO Severe thrombocytopenia cotinues to make use of  antiplatelets/anticoagulation and consideration for PFO closure a challenge Cont keppra  as elsewhere for seizure prophylaxis SLP signed off PT/OT following Neurology following Hematology/oncology following   Severe sepsis  Community-acquired pneumonia:  Bcx 1/3 with NGTD, final read pending MRSA screen is negative S/p treatment with IV fluids. Status post 5-day course on antibiotics, finished on 11/30/24   Sinus tachycardia:  Persistent in nature Likely from underlying acute illness.  Suspect anxiety could be contributing to this.  Chart review shows she was tachycardic with heart rate of 126 at her oncologist office on 11/22/2024. Monitor clinically     Elevated liver enzymes:  Likely due to metastatic liver disease. Monitor clinically   Anxiety At home on xanax  TID PRN Cont as BID PRN for now   Hyponatremia: Improved.  DVT prophylaxis: Place and maintain sequential compression device Start: 11/29/24 0851 SCDs Start: 11/26/24 1706  SCDs   Code Status: Full Code  Disposition Plan: TBD Reason for continuing need for hospitalization: severity of illness  Objective: Vitals:   12/05/24 1100 12/05/24 1151 12/05/24 1200 12/05/24 1210  BP: 107/87  100/73 105/73  Pulse:   (!) 120 (!) 119  Resp:      Temp:  97.8 F (36.6 C)  97.9 F (36.6 C)  TempSrc:  Axillary  Axillary  SpO2:   92% 93%  Weight:      Height:        Intake/Output Summary (Last 24 hours) at 12/05/2024 1247 Last data filed at 12/05/2024 0042 Gross per 24 hour  Intake 4421.33 ml  Output --  Net 4421.33 ml   Filed Weights   12/03/24 0411 12/04/24 0500 12/05/24 0645  Weight: 103.7 kg 103.1 kg 102.7 kg    Examination:  Physical Exam Vitals and nursing note reviewed.  Constitutional:      General: She is not in acute distress.    Appearance: She is ill-appearing.     Comments: Somnolent, frail  HENT:     Head: Normocephalic and atraumatic.  Cardiovascular:     Rate and Rhythm: Normal rate and  regular rhythm.     Pulses: Normal pulses.     Heart sounds: Normal heart sounds.  Pulmonary:     Effort: Respiratory distress (mild) present.     Breath sounds: No wheezing.     Comments: Bilateral LL crackles Abdominal:     General: Bowel sounds are normal. There is no distension.     Palpations: Abdomen is soft.     Tenderness: There is no abdominal tenderness.  Neurological:     Mental Status: She is alert.     Data Reviewed: I have personally reviewed following labs and imaging studies  CBC: Recent Labs  Lab 11/29/24 0559 11/30/24 0541 11/30/24 0834 11/30/24 1617 12/01/24 0412 12/02/24 0449 12/02/24 2012 12/03/24 0606 12/04/24 0516 12/04/24 0816 12/04/24 2132 12/05/24 0242  WBC 2.8*   < > 2.8*   < > 2.6*   < > 1.0* 1.0* 0.5* 0.5* 0.5* 0.5*  NEUTROABS 2.3  --  2.3  --  2.1  --  0.8*  --   --  0.2*  --   --   HGB 8.4*   < > 8.5*   < > 8.7*   < > 7.5* 7.3* 6.8* 7.2* 8.1* 8.5*  HCT 25.7*   < > 26.0*   < > 26.2*   < > 22.0* 21.2* 19.6* 21.0* 23.5* 24.4*  MCV 74.1*   < >  73.4*   < > 73.4*   < > 70.5* 69.7* 68.5* 69.8* 70.4* 70.1*  PLT 26*   < > 10*   < > 12*   < > 15* 7* 13* 10* 13* 12*   < > = values in this interval not displayed.   Basic Metabolic Panel: Recent Labs  Lab 11/29/24 0559 11/30/24 0541 12/02/24 2013 12/04/24 0516 12/04/24 1728 12/05/24 0242 12/05/24 1020  NA 137   < > 135 134* 137 138 137  K 3.8   < > 4.9 5.0 4.5 4.3 4.7  CL 101   < > 96* 95* 97* 96* 97*  CO2 24   < > 20* 17* 21* 21* 19*  GLUCOSE 97   < > 157* 99 103* 112* 116*  BUN 7   < > 10 13 14 15 18   CREATININE 0.57   < > 0.61 0.66 0.64 0.70 0.81  CALCIUM 8.3*   < > 8.1* 8.5* 8.1* 8.2* 8.4*  MG 1.8  --   --   --   --   --   --   PHOS 1.9*  --   --   --   --   --   --    < > = values in this interval not displayed.   GFR: Estimated Creatinine Clearance: 98 mL/min (by C-G formula based on SCr of 0.81 mg/dL). Liver Function Tests: Recent Labs  Lab 11/29/24 0559 11/30/24 0541  12/02/24 0449 12/04/24 0516 12/04/24 1728  AST  --  486* 663* 1,015* 1,037*  ALT  --  362* 341* 373* 376*  ALKPHOS  --  515* 467* 489* 476*  BILITOT  --  1.2 1.5* 3.1* 3.8*  PROT  --  5.8* 5.4* 5.4* 5.1*  ALBUMIN 2.6* 2.6* 2.5* 2.6* 2.5*   No results for input(s): LIPASE, AMYLASE in the last 168 hours. No results for input(s): AMMONIA in the last 168 hours. Coagulation Profile: No results for input(s): INR, PROTIME in the last 168 hours. Cardiac Enzymes: No results for input(s): CKTOTAL, CKMB, CKMBINDEX, TROPONINI in the last 168 hours. ProBNP, BNP (last 5 results) No results for input(s): PROBNP, BNP in the last 8760 hours. HbA1C: No results for input(s): HGBA1C in the last 72 hours. CBG: Recent Labs  Lab 11/28/24 1600 11/28/24 2014 11/29/24 0820 11/29/24 1135 11/29/24 1706  GLUCAP 145* 117* 108* 96 79   Lipid Profile: No results for input(s): CHOL, HDL, LDLCALC, TRIG, CHOLHDL, LDLDIRECT in the last 72 hours. Thyroid  Function Tests: No results for input(s): TSH, T4TOTAL, FREET4, T3FREE, THYROIDAB in the last 72 hours. Anemia Panel: No results for input(s): VITAMINB12, FOLATE, FERRITIN, TIBC, IRON, RETICCTPCT in the last 72 hours. Sepsis Labs: Recent Labs  Lab 12/04/24 0516 12/04/24 1728 12/05/24 0242 12/05/24 1020  LATICACIDVEN >9.0* 8.1* 7.7* >9.0*    Recent Results (from the past 240 hours)  Culture, blood (Routine x 2)     Status: None (Preliminary result)   Collection Time: 11/26/24  1:01 PM   Specimen: BLOOD  Result Value Ref Range Status   Specimen Description BLOOD RIGHT ANTECUBITAL  Final   Special Requests   Final    BOTTLES DRAWN AEROBIC AND ANAEROBIC Blood Culture results may not be optimal due to an inadequate volume of blood received in culture bottles   Culture   Final    NO GROWTH 4 DAYS Performed at [facility], [address], [place], [place] [address]    Report Status  PENDING  Incomplete  Resp panel by RT-PCR (RSV, Flu A&B, Covid) Anterior Nasal Swab     Status: None   Collection Time: 11/26/24  1:30 PM   Specimen: Anterior Nasal Swab  Result Value Ref Range Status   SARS Coronavirus 2 by RT PCR NEGATIVE NEGATIVE Final    Comment: (NOTE) SARS-CoV-2 target nucleic acids are NOT DETECTED.  The SARS-CoV-2 RNA is generally detectable in upper respiratory specimens during the acute phase of infection. The lowest concentration of SARS-CoV-2 viral copies this assay can detect is 138 copies/mL. A negative result does not preclude SARS-Cov-2 infection and should not be used as the sole basis for treatment or other patient management decisions. A negative result may occur with  improper specimen collection/handling, submission of specimen other than nasopharyngeal swab, presence of viral mutation(s) within the areas targeted by this assay, and inadequate number of viral copies(<138 copies/mL). A negative result must be combined with clinical observations, patient history, and epidemiological information. The expected result is Negative.  Fact Sheet for Patients:  bloggercourse.com  Fact Sheet for Healthcare Providers:  seriousbroker.it  This test is no t yet approved or cleared by the [place]  FDA and  has been authorized for detection and/or diagnosis of SARS-CoV-2 by FDA under an Emergency Use Authorization (EUA). This EUA will remain  in effect (meaning this test can be used) for the duration of the COVID-19 declaration under Section 564(b)(1) of the Act, 21 U.S.C.section 360bbb-3(b)(1), unless the authorization is terminated  or revoked sooner.       Influenza A by PCR NEGATIVE NEGATIVE Final   Influenza B by PCR NEGATIVE NEGATIVE Final    Comment: (NOTE) The Xpert Xpress SARS-CoV-2/FLU/RSV plus assay is intended as an aid in the diagnosis of influenza from Nasopharyngeal swab specimens  and should not be used as a sole basis for treatment. Nasal washings and aspirates are unacceptable for Xpert Xpress SARS-CoV-2/FLU/RSV testing.  Fact Sheet for Patients: bloggercourse.com  Fact Sheet for Healthcare Providers: seriousbroker.it  This test is not yet approved or cleared by the [place]  FDA and has been authorized for detection and/or diagnosis of SARS-CoV-2 by FDA under an Emergency Use Authorization (EUA). This EUA will remain in effect (meaning this test can be used) for the duration of the COVID-19 declaration under Section 564(b)(1) of the Act, 21 U.S.C. section 360bbb-3(b)(1), unless the authorization is terminated or revoked.     Resp Syncytial Virus by PCR NEGATIVE NEGATIVE Final    Comment: (NOTE) Fact Sheet for Patients: bloggercourse.com  Fact Sheet for Healthcare Providers: seriousbroker.it  This test is not yet approved or cleared by the [place]  FDA and has been authorized for detection and/or diagnosis of SARS-CoV-2 by FDA under an Emergency Use Authorization (EUA). This EUA will remain in effect (meaning this test can be used) for the duration of the COVID-19 declaration under Section 564(b)(1) of the Act, 21 U.S.C. section 360bbb-3(b)(1), unless the authorization is terminated or revoked.  Performed at [facility], [address] [place], [place] [address]   Culture, blood (Routine x 2)     Status: None (Preliminary result)   Collection Time: 11/26/24  1:40 PM   Specimen: BLOOD  Result Value Ref Range Status   Specimen Description BLOOD BLOOD RIGHT HAND  Final   Special Requests   Final    BOTTLES DRAWN AEROBIC AND ANAEROBIC Blood Culture adequate volume   Culture   Final    NO GROWTH 4 DAYS Performed at [facility], [address] [place], [place] [address]    Report Status PENDING  Incomplete  MRSA Next  Gen by PCR, Nasal     Status: None   Collection Time: 11/29/24  8:31 PM   Specimen: Nasal Mucosa; Nasal Swab  Result Value Ref Range Status   MRSA by PCR Next Gen NOT DETECTED NOT DETECTED Final    Comment: (NOTE) The GeneXpert MRSA Assay (FDA approved for NASAL specimens only), is one component of a comprehensive MRSA colonization surveillance program. It is not intended to diagnose MRSA infection nor to guide or monitor treatment for MRSA infections. Test performance is not FDA approved in patients less than 66 years old. Performed at [facility], [address], [place], [place] [address]      Radiology Studies: No results found.  Scheduled Meds:  sodium chloride    Intravenous Once   sodium chloride    Intravenous Once   Chlorhexidine  Gluconate Cloth  6 each Topical Daily   feeding supplement  237 mL Oral BID BM   filgrastim  (NIVESTYM ) SQ  480 mcg Subcutaneous Q2000   levETIRAcetam   500 mg Oral BID   sodium chloride  flush  10-40 mL Intracatheter Q12H   sodium chloride  flush  10-40 mL Intracatheter Q12H   Continuous Infusions:  sodium bicarbonate  150 mEq in dextrose  5 % 1,150 mL infusion Stopped (12/05/24 0042)     LOS: 8 days   Norval Bar, MD  [facility]  12/05/2024, 12:47 PM   "

## 2024-12-05 NOTE — Plan of Care (Signed)
  Problem: Education: Goal: Knowledge of General Education information will improve Description: Including pain rating scale, medication(s)/side effects and non-pharmacologic comfort measures Outcome: Progressing   Problem: Health Behavior/Discharge Planning: Goal: Ability to manage health-related needs will improve Outcome: Progressing   Problem: Clinical Measurements: Goal: Ability to maintain clinical measurements within normal limits will improve Outcome: Progressing Goal: Will remain free from infection Outcome: Progressing Goal: Diagnostic test results will improve Outcome: Progressing Goal: Respiratory complications will improve Outcome: Progressing Goal: Cardiovascular complication will be avoided Outcome: Progressing   Problem: Activity: Goal: Risk for activity intolerance will decrease Outcome: Progressing   Problem: Nutrition: Goal: Adequate nutrition will be maintained Outcome: Progressing   Problem: Coping: Goal: Level of anxiety will decrease Outcome: Progressing   Problem: Elimination: Goal: Will not experience complications related to bowel motility Outcome: Progressing Goal: Will not experience complications related to urinary retention Outcome: Progressing   Problem: Pain Managment: Goal: General experience of comfort will improve and/or be controlled Outcome: Progressing   Problem: Safety: Goal: Ability to remain free from injury will improve Outcome: Progressing   Problem: Skin Integrity: Goal: Risk for impaired skin integrity will decrease Outcome: Progressing   Problem: Education: Goal: Knowledge of disease or condition will improve Outcome: Progressing Goal: Knowledge of secondary prevention will improve (MUST DOCUMENT ALL) Outcome: Progressing Goal: Knowledge of patient specific risk factors will improve (DELETE if not current risk factor) Outcome: Progressing   Problem: Coping: Goal: Will verbalize positive feelings about  self Outcome: Progressing Goal: Will identify appropriate support needs Outcome: Progressing   Problem: Health Behavior/Discharge Planning: Goal: Ability to manage health-related needs will improve Outcome: Progressing Goal: Goals will be collaboratively established with patient/family Outcome: Progressing   Problem: Self-Care: Goal: Ability to participate in self-care as condition permits will improve Outcome: Progressing Goal: Verbalization of feelings and concerns over difficulty with self-care will improve Outcome: Progressing Goal: Ability to communicate needs accurately will improve Outcome: Progressing   Problem: Nutrition: Goal: Risk of aspiration will decrease Outcome: Progressing Goal: Dietary intake will improve Outcome: Progressing

## 2024-12-05 NOTE — Progress Notes (Signed)
 SPIRITUAL CARE AND COUNSELING CONSULT NOTE   VISIT SUMMARY    SPIRITUAL ENCOUNTER                                                                                                                                                                      Type of Visit: Follow up Care provided to:: Pt and family Conversation partners present during encounter: Nurse Reason for visit: Urgent spiritual support   SPIRITUAL FRAMEWORK  Community/Connection: Family, Friend(s) Patient Stress Factors: Loss of control, Major life changes Family Stress Factors: Exhausted, Health changes, Loss of control, Major life changes   GOALS       INTERVENTIONS   Spiritual Care Interventions Made: Reflective listening, Established relationship of care and support, Explored values/beliefs/practices/strengths, Bereavement/grief support    INTERVENTION OUTCOMES   Outcomes: Connection to spiritual care, Reduced isolation  SPIRITUAL CARE PLAN        If immediate needs arise, please contact [facility] 24 hour on call [phone]   Tara Hanna  12/05/2024 3:34 PM

## 2024-12-05 NOTE — Progress Notes (Signed)
 The patient is transferred from 2A to ICU 05. The patient is  drowsy but response to voice. The patient and husband are oriented to the staff and call bell. Will continue to monitor.

## 2024-12-05 NOTE — Progress Notes (Signed)
 "                                                                                                                                                                                               Palliative Care Progress Note, Assessment & Plan   Patient Name: Tara Hanna       Date: 12/05/2024 DOB: 08-Jul-1976  Age: 49 y.o. MRN#: [id number] Attending Physician: Cosette Blackwater, MD Primary Care Physician: System, Provider Not In Admit Date: 11/26/2024  Subjective: Pt reports feeling better. Complains of abdominal pain and nausea.   HPI: 49 y.o. female  with past medical history significant for HTN and metastatic breast cancer with metastasis to liver, brain and bone on chemotherapy. Patient presented to ED 11/26/2024 from home c/o abdominal distention/pain, back pain and DOE with shortness of breath at rest. Patient reports last chemo was 11/22/24.    ED labs significant for anion gap 17, Alk phos 759, albumin 3.4, AST 617, ALT 503. Lactic acid 4.4-->3.2-->3.6-->3.5.  Hgb 10.4, Hct 31.7, plts 61. D dimer >20   ED vitals 90/70, HR 136, SpO2 97% 2L, RR 24, 98.40F   CTA chest demonstrated: 1. No evidence of pulmonary embolism. 2. Stable left upper lobe nodular airspace disease and several small pulmonary nodules. 3. Stable bilateral hilar and mediastinal adenopathy. 4. 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 5. Numerous hepatic metastases, better seen on earlier CT.   TRH was consulted for admission and management of severe sepsis 2/2 CAP, thrombocytopenia, anemia of chronic disease, elevated liver enzymes, hyponatremia and breast cancer with metastasis to liver, bone and brain.    Palliative medicine team was consulted for assistance with goals of care conversations.    Summary of counseling/coordination of care: Extensive chart review completed prior to meeting patient including labs,  vital signs, imaging, progress notes, orders, and available advanced directive documents from current and previous encounters.    After reviewing the patient's chart and assessing the patient at bedside, I spoke with patient and daughter in regards to symptom management and goals of care.  Chronically ill-appearing, pleasant female lying in bed with attending MD, primary RN and daughter at bedside.  She is lethargic but acknowledges my presence.  She is not able to participate in conversation due to her lethargy.  Patient noted to be tachypneic with intermittent moaning.   Expressed concern to Tara Hanna (daughter) about pt current status with extremely abnormal labs and worsening respiratory status. Discussed with patient current full CODE STATUS and fear for cardiac arrest. Tara Hanna  questions what would happen if that were to happen. We discussed CPR and the risks of unintentional injury as well as placing her on ventilator for respiratory support. Tara Hanna states that she does not want that for her mother and she wants her to be comfortable. We discussed a DNR would prevent a CPR event and would allowed her mother to have a natural death. Also explained if her mother continue to not respond to treatment, family could consider transition to comfort focused care where aggressive medical interventions such as fluids, abx, labs would be discontinued, with focus on treating symptoms only.   Tara Hanna shares that she wants to talk to family about how to proceed.   Therapeutic silence and active listening provided for patient's daughter to share her thoughts and emotions regarding current medical situation.  Emotional support provided.  Shared details of discussion with Dr. Cosette, Dr. Rennie, [name], NP and nursing staff.   Chart review:   Labs: Neutropenia, Thrombocytopenia, Lactic acidosis >9    Latest Ref Rng & Units 12/05/2024    2:42 AM 12/04/2024    9:32 PM 12/04/2024    8:16 AM  CBC  WBC 4.0 -  10.5 K/uL 0.5  0.5  0.5   Hemoglobin 12.0 - 15.0 g/dL 8.5  8.1  7.2   Hematocrit 36.0 - 46.0 % 24.4  23.5  21.0   Platelets 150 - 400 K/uL 12  13  10        Latest Ref Rng & Units 12/05/2024   10:20 AM 12/05/2024    2:42 AM 12/04/2024    5:28 PM  CMP  Glucose 70 - 99 mg/dL 883  887  896   BUN 6 - 20 mg/dL 18  15  14    Creatinine 0.44 - 1.00 mg/dL 9.18  9.29  9.35   Sodium 135 - 145 mmol/L 137  138  137   Potassium 3.5 - 5.1 mmol/L 4.7  4.3  4.5   Chloride 98 - 111 mmol/L 97  96  97   CO2 22 - 32 mmol/L 19  21  21    Calcium 8.9 - 10.3 mg/dL 8.4  8.2  8.1   Total Protein 6.5 - 8.1 g/dL   5.1   Total Bilirubin 0.0 - 1.2 mg/dL   3.8   Alkaline Phos 38 - 126 U/L   476   AST 15 - 41 U/L   1,037   ALT 0 - 44 U/L   376    Lactic Acid, Venous    Component Value Date/Time   LATICACIDVEN >9.0 (HH) 12/05/2024 1020     Vitals: Blood pressure 103/75, pulse (!) 119, temperature 97.8 F (36.6 C), temperature source Axillary, resp. rate (!) 25, height 5' 4 (1.626 m), weight 102.7 kg, SpO2 93%.   Progress notes: Reviewed progress notes from PMT, TRH, oncology, TOC, neurology  Imaging: CXR today demonstrated patchy bilateral airspace opacities that have increased since prior exam with low lung volumes.  MAR: No changes to [facility]  ACP documents: Reviewed GOC documents on file   Physical Exam Vitals reviewed.  Constitutional:      General: She is not in acute distress.    Appearance: She is ill-appearing.     Comments: Lethargic  HENT:     Head: Normocephalic and atraumatic.     Mouth/Throat:     Mouth: Mucous membranes are dry.  Pulmonary:     Effort: No respiratory distress.     Comments: Tachypnea Musculoskeletal:  Right lower leg: No edema.     Left lower leg: No edema.  Skin:    General: Skin is warm and dry.  Neurological:     Motor: Weakness present.   Recommendations/Plan: FULL CODE status as previously documented    Continue current supportive interventions Family  request time to meet to make decisions regarding care  Extensively discussed plan of care with Dr. Cosette, Dr. Rennie, [name], NP, charge nurse and primary RN  I personally spent a total of 80 minutes in the care of the patient today including preparing to see the patient, getting/reviewing separately obtained history, performing a medically appropriate exam/evaluation, counseling and educating, referring and communicating with other health care professionals, documenting clinical information in the EHR, independently interpreting results, communicating results, coordinating care, and holding lengthy discussions with family members in regard to care.  Addendum 1514: Received message from oncology that family was requesting transition to comfort care.  Upon arrival to room spoke with Tara Hanna (daughter), Tara Hanna (mother), Tara Hanna (cousin) and Tara Hanna (cousin) at bedside.  Family present said they were not aware of her request for comfort care.  At that time multiple questions were answered for family in regards to severe lab derangements and poor prognosis.  We discussed full code versus DNR status and concern for a resuscitation event causing harm to patient.  Reiterated concern for impending cardiac arrest due to severely abnormal labs and worsening respiratory status.  Family wants to gather and collectively make decision in regards to DNR and comfort care.  After visit, contacted Tara Hanna (husband) via phone and discussed possible transition to comfort care.  He states he is not sure at this time and has been trying to make a decision along with his daughter Tara Hanna about DNR.  He advises he will visit later today.  He understands if he wishes to change her CODE STATUS he can ask for a provider.   Family's questions were answered and they have PMT contact information should they have questions or concerns regarding care.  PMT will follow-up for continuing goals of care.   Tara Hanna, AMANDA  [facility] Palliative Medicine Team  12/05/2024 9:43 AM  Office [phone]  Pager [phone]     "

## 2024-12-05 NOTE — Progress Notes (Signed)
" °   12/05/24 2100  Spiritual Encounters  Type of Visit Initial  Care provided to: Pt and family  Referral source Patient request  Reason for visit Advance directives  OnCall Visit Yes   Chaplain provided and explained AD paperwork. "

## 2024-12-05 NOTE — TOC Progression Note (Signed)
 Transition of Care [facility]) - Progression Note    Patient Details  Name: Tara Hanna MRN: [id number] Date of Birth: 1976-09-09  Transition of Care [facility]) CM/SW Contact  [name], LCSW Phone Number: 12/05/2024, 1:01 PM  Clinical Narrative:   CSW continues to follow progress.  Expected Discharge Plan: Home/Self Care Barriers to Discharge: Continued Medical Work up               Expected Discharge Plan and Services     Post Acute Care Choice: NA Living arrangements for the past 2 months: Single Family Home                                       Social Drivers of Health (SDOH) Interventions SDOH Screenings   Food Insecurity: No Food Insecurity (11/27/2024)  Housing: Unknown (11/27/2024)  Transportation Needs: No Transportation Needs (11/27/2024)  Utilities: Not At Risk (11/27/2024)  Recent Concern: Utilities - At Risk (11/08/2024)  Depression (PHQ2-9): Low Risk (11/08/2024)  Financial Resource Strain: High Risk (03/21/2022)   Received from [facility]  Tobacco Use: Medium Risk (11/26/2024)    Readmission Risk Interventions    11/29/2024   10:25 AM  Readmission Risk Prevention Plan  Transportation Screening Complete  PCP or Specialist Appt within 5-7 Days Complete  Medication Review (RN CM) Complete

## 2024-12-05 NOTE — Progress Notes (Signed)
 Patient drowsy moaning.  Unable to arouse daughter by the bedside.  Reviewed her worsening clinical condition-recommend de-escalation of care.  Reiterated this with the daughter her extremely poor prognosis.  Discussed with Dr. Darien the futility of radiation given her worsening acidosis liver function/neutropenia excetra.   Discussed with palliative care- Oncology will follow peripherally as needed.  GB

## 2024-12-05 NOTE — Progress Notes (Signed)
"  ° °      Overnight   NAME: Tara Hanna MRN: [id number] DOB : November 21, 1976    Date of Service   12/05/2024   HPI/Events of Note   HPI: 49 year old female with medical history of hypertension, metastatic breast cancer with mets to the liver, brain and bone on chemotherapy, presented to the hospital with shortness of breath abdominal and back pain diagnosed with severe sepsis secondary to community-acquired pneumonia.  Found to have an acute embolic stroke on MRI on 11/29/2024.   Overnight: Notified by charge RN of lab derangements, deteriorating mental status, and family planning to keep patient full code currently.  On assessment patient is mildly responsive, lethargic, hypotensive, PVCs noted on monitor.  Escalated care to stepdown unit, EKG ordered  Physical Exam Vitals reviewed.  Constitutional:      Appearance: She is ill-appearing.  Eyes:     Extraocular Movements:     Left eye: Abnormal extraocular motion present.  Cardiovascular:     Rate and Rhythm: Tachycardia present.     Pulses:          Radial pulses are 2+ on the right side and 2+ on the left side.       Posterior tibial pulses are 1+ on the right side and 2+ on the left side.     Heart sounds: S1 normal and S2 normal.  Pulmonary:     Breath sounds: Examination of the right-upper field reveals decreased breath sounds and rhonchi. Examination of the left-upper field reveals decreased breath sounds and rhonchi. Examination of the right-middle field reveals decreased breath sounds. Examination of the left-middle field reveals decreased breath sounds. Examination of the right-lower field reveals decreased breath sounds. Examination of the left-lower field reveals decreased breath sounds. Decreased breath sounds and rhonchi present.  Abdominal:     General: Bowel sounds are normal.  Musculoskeletal:     Right forearm: Swelling present.     Left forearm: Swelling present.     Right lower leg: 2+ Edema present.     Left lower  leg: 2+ Edema present.  Skin:    General: Skin is warm and dry.  Neurological:     Mental Status: She is lethargic.     GCS: GCS eye subscore is 4. GCS verbal subscore is 4. GCS motor subscore is 5.       Interventions/ Plan   Escalate to stepdown unit for closer monitoring EKG showing sinus tachycardia.  No ST elevation    Updates     Laneta Gardener- Garmon BSN RN CCRN AGACNP-BC Acute Care Nurse Practitioner [facility]  "

## 2024-12-06 ENCOUNTER — Ambulatory Visit

## 2024-12-06 DIAGNOSIS — D696 Thrombocytopenia, unspecified: Secondary | ICD-10-CM | POA: Diagnosis not present

## 2024-12-06 DIAGNOSIS — R6520 Severe sepsis without septic shock: Secondary | ICD-10-CM | POA: Diagnosis not present

## 2024-12-06 DIAGNOSIS — C787 Secondary malignant neoplasm of liver and intrahepatic bile duct: Secondary | ICD-10-CM

## 2024-12-06 DIAGNOSIS — C7931 Secondary malignant neoplasm of brain: Secondary | ICD-10-CM | POA: Diagnosis not present

## 2024-12-06 DIAGNOSIS — A419 Sepsis, unspecified organism: Secondary | ICD-10-CM | POA: Diagnosis not present

## 2024-12-06 DIAGNOSIS — C50919 Malignant neoplasm of unspecified site of unspecified female breast: Secondary | ICD-10-CM | POA: Diagnosis not present

## 2024-12-06 DIAGNOSIS — C7951 Secondary malignant neoplasm of bone: Secondary | ICD-10-CM | POA: Diagnosis not present

## 2024-12-06 DIAGNOSIS — Z515 Encounter for palliative care: Secondary | ICD-10-CM | POA: Diagnosis not present

## 2024-12-06 DIAGNOSIS — J189 Pneumonia, unspecified organism: Secondary | ICD-10-CM | POA: Diagnosis not present

## 2024-12-06 LAB — BLOOD GAS, VENOUS
Acid-base deficit: 0.5 mmol/L (ref 0.0–2.0)
Bicarbonate: 23.1 mmol/L (ref 20.0–28.0)
O2 Saturation: 63.5 %
Patient temperature: 37
pCO2, Ven: 34 mmHg — ABNORMAL LOW (ref 44–60)
pH, Ven: 7.44 — ABNORMAL HIGH (ref 7.25–7.43)
pO2, Ven: 40 mmHg (ref 32–45)

## 2024-12-06 LAB — CBC WITH DIFFERENTIAL/PLATELET
Abs Immature Granulocytes: 0.04 K/uL (ref 0.00–0.07)
Basophils Absolute: 0 K/uL (ref 0.0–0.1)
Basophils Relative: 1 %
Eosinophils Absolute: 0 K/uL (ref 0.0–0.5)
Eosinophils Relative: 1 %
HCT: 23.3 % — ABNORMAL LOW (ref 36.0–46.0)
Hemoglobin: 7.9 g/dL — ABNORMAL LOW (ref 12.0–15.0)
Immature Granulocytes: 5 %
Lymphocytes Relative: 36 %
Lymphs Abs: 0.3 K/uL — ABNORMAL LOW (ref 0.7–4.0)
MCH: 24.2 pg — ABNORMAL LOW (ref 26.0–34.0)
MCHC: 33.9 g/dL (ref 30.0–36.0)
MCV: 71.3 fL — ABNORMAL LOW (ref 80.0–100.0)
Monocytes Absolute: 0.2 K/uL (ref 0.1–1.0)
Monocytes Relative: 21 %
Neutro Abs: 0.3 K/uL — CL (ref 1.7–7.7)
Neutrophils Relative %: 36 %
Platelets: 12 K/uL — CL (ref 150–400)
RBC: 3.27 MIL/uL — ABNORMAL LOW (ref 3.87–5.11)
RDW: 23.2 % — ABNORMAL HIGH (ref 11.5–15.5)
WBC: 0.8 K/uL — CL (ref 4.0–10.5)
nRBC: 21.8 % — ABNORMAL HIGH (ref 0.0–0.2)

## 2024-12-06 LAB — HEPATIC FUNCTION PANEL
ALT: 426 U/L — ABNORMAL HIGH (ref 0–44)
AST: 1193 U/L — ABNORMAL HIGH (ref 15–41)
Albumin: 2.5 g/dL — ABNORMAL LOW (ref 3.5–5.0)
Alkaline Phosphatase: 531 U/L — ABNORMAL HIGH (ref 38–126)
Bilirubin, Direct: 4.4 mg/dL — ABNORMAL HIGH (ref 0.0–0.2)
Indirect Bilirubin: 1.2 mg/dL — ABNORMAL HIGH (ref 0.3–0.9)
Total Bilirubin: 5.5 mg/dL — ABNORMAL HIGH (ref 0.0–1.2)
Total Protein: 5.1 g/dL — ABNORMAL LOW (ref 6.5–8.1)

## 2024-12-06 LAB — BLOOD GAS, ARTERIAL
Acid-base deficit: 2 mmol/L (ref 0.0–2.0)
Bicarbonate: 20.9 mmol/L (ref 20.0–28.0)
O2 Content: 10 L/min
O2 Saturation: 97.2 %
Patient temperature: 37
pCO2 arterial: 28 mmHg — ABNORMAL LOW (ref 32–48)
pH, Arterial: 7.48 — ABNORMAL HIGH (ref 7.35–7.45)
pO2, Arterial: 78 mmHg — ABNORMAL LOW (ref 83–108)

## 2024-12-06 LAB — BASIC METABOLIC PANEL WITH GFR
Anion gap: 18 — ABNORMAL HIGH (ref 5–15)
BUN: 22 mg/dL — ABNORMAL HIGH (ref 6–20)
CO2: 23 mmol/L (ref 22–32)
Calcium: 8.1 mg/dL — ABNORMAL LOW (ref 8.9–10.3)
Chloride: 99 mmol/L (ref 98–111)
Creatinine, Ser: 0.86 mg/dL (ref 0.44–1.00)
GFR, Estimated: >60 mL/min
Glucose, Bld: 101 mg/dL — ABNORMAL HIGH (ref 70–99)
Potassium: 4.3 mmol/L (ref 3.5–5.1)
Sodium: 140 mmol/L (ref 135–145)

## 2024-12-06 LAB — BPAM PLATELET PHERESIS
Blood Product Expiration Date: 202601142359
ISSUE DATE / TIME: 202601121144
Unit Type and Rh: 5100

## 2024-12-06 LAB — LACTIC ACID, PLASMA
Lactic Acid, Venous: 6.8 mmol/L (ref 0.5–1.9)
Lactic Acid, Venous: 7.3 mmol/L (ref 0.5–1.9)
Lactic Acid, Venous: 7 mmol/L (ref 0.5–1.9)

## 2024-12-06 LAB — PREPARE PLATELET PHERESIS: Unit division: 0

## 2024-12-06 LAB — MRSA NEXT GEN BY PCR, NASAL: MRSA by PCR Next Gen: NOT DETECTED

## 2024-12-06 MED ORDER — LEVETIRACETAM (KEPPRA) 500 MG/5 ML ADULT IV PUSH
500.0000 mg | Freq: Two times a day (BID) | INTRAVENOUS | Status: DC
  Administered 2024-12-06: 500 mg via INTRAVENOUS
  Filled 2024-12-06 (×2): qty 5

## 2024-12-06 MED ORDER — HYDROMORPHONE HCL-NACL 50-0.9 MG/50ML-% IV SOLN
0.0000 mg/h | INTRAVENOUS | Status: DC
  Administered 2024-12-06: 1 mg/h via INTRAVENOUS
  Filled 2024-12-06: qty 50

## 2024-12-06 MED ORDER — VANCOMYCIN HCL 750 MG/150ML IV SOLN
750.0000 mg | Freq: Two times a day (BID) | INTRAVENOUS | Status: DC
  Filled 2024-12-06: qty 150

## 2024-12-06 MED ORDER — CHLORHEXIDINE GLUCONATE CLOTH 2 % EX PADS: 6.0000 | MEDICATED_PAD | Freq: Every day | CUTANEOUS | Status: DC

## 2024-12-06 MED ORDER — FUROSEMIDE 10 MG/ML IJ SOLN
4.0000 mg/h | INTRAVENOUS | Status: DC
  Administered 2024-12-06: 4 mg/h via INTRAVENOUS
  Filled 2024-12-06: qty 20

## 2024-12-06 MED ORDER — FUROSEMIDE 10 MG/ML IJ SOLN
40.0000 mg | Freq: Once | INTRAMUSCULAR | Status: AC
  Administered 2024-12-06: 40 mg via INTRAVENOUS
  Filled 2024-12-06: qty 4

## 2024-12-06 MED ORDER — FUROSEMIDE 10 MG/ML IJ SOLN
20.0000 mg | Freq: Once | INTRAMUSCULAR | Status: AC
  Administered 2024-12-06: 20 mg via INTRAVENOUS
  Filled 2024-12-06: qty 2

## 2024-12-06 MED ORDER — GLYCOPYRROLATE 0.2 MG/ML IJ SOLN: 0.2000 mg | INTRAMUSCULAR | Status: DC | PRN

## 2024-12-06 MED ORDER — ACETAMINOPHEN 325 MG PO TABS: 650.0000 mg | ORAL_TABLET | Freq: Four times a day (QID) | ORAL | Status: DC | PRN

## 2024-12-06 MED ORDER — LORAZEPAM 2 MG/ML IJ SOLN
0.5000 mg | INTRAMUSCULAR | Status: DC | PRN
  Administered 2024-12-06: 0.5 mg via INTRAVENOUS
  Filled 2024-12-06: qty 1

## 2024-12-06 MED ORDER — POLYVINYL ALCOHOL 1.4 % OP SOLN
1.0000 [drp] | Freq: Four times a day (QID) | OPHTHALMIC | Status: DC | PRN
  Filled 2024-12-06: qty 15

## 2024-12-06 MED ORDER — GLYCOPYRROLATE 1 MG PO TABS
1.0000 mg | ORAL_TABLET | ORAL | Status: DC | PRN
  Filled 2024-12-06: qty 1

## 2024-12-06 MED ORDER — FUROSEMIDE 10 MG/ML IJ SOLN: 20.0000 mg | Freq: Once | INTRAMUSCULAR | Status: DC

## 2024-12-06 MED ORDER — MAGIC MOUTHWASH W/LIDOCAINE
10.0000 mL | Freq: Four times a day (QID) | ORAL | Status: DC
  Administered 2024-12-06: 10 mL via ORAL
  Filled 2024-12-06 (×3): qty 10

## 2024-12-06 MED ORDER — PIPERACILLIN-TAZOBACTAM 3.375 G IVPB
3.3750 g | Freq: Three times a day (TID) | INTRAVENOUS | Status: DC
  Administered 2024-12-06: 3.375 g via INTRAVENOUS
  Filled 2024-12-06: qty 50

## 2024-12-06 MED ORDER — HYDROMORPHONE BOLUS VIA INFUSION
1.0000 mg | INTRAVENOUS | Status: DC | PRN
  Administered 2024-12-06: 1 mg via INTRAVENOUS

## 2024-12-06 MED ORDER — ACETAMINOPHEN 650 MG RE SUPP: 650.0000 mg | Freq: Four times a day (QID) | RECTAL | Status: DC | PRN

## 2024-12-06 MED ORDER — VANCOMYCIN HCL 2000 MG/400ML IV SOLN
2000.0000 mg | Freq: Once | INTRAVENOUS | Status: AC
  Administered 2024-12-06: 2000 mg via INTRAVENOUS
  Filled 2024-12-06: qty 400

## 2024-12-06 MED ORDER — SODIUM CHLORIDE 0.9 % IV SOLN: INTRAVENOUS | Status: DC

## 2024-12-06 MED ORDER — LORAZEPAM 2 MG/ML IJ SOLN: 2.0000 mg | INTRAMUSCULAR | Status: DC | PRN

## 2024-12-06 NOTE — Progress Notes (Signed)
" °   12/06/24 1345  Spiritual Encounters  Type of Visit Follow up  Care provided to: Patient (Pt was moaning w/every breath; let nurse know)  Referral source Nurse (RN/NT/LPN)  Reason for visit  (Support)  OnCall Visit No  Interventions  Spiritual Care Interventions Made Compassionate presence    "

## 2024-12-06 NOTE — Progress Notes (Signed)
 SPIRITUAL CARE AND COUNSELING CONSULT NOTE   VISIT SUMMARY Chaplain visited with patient/family to offer a compassionate presence and reflective listening as loved one is at end of life.    SPIRITUAL ENCOUNTER                                                                                                                                                                      Type of Visit: Initial Care provided to:: Pt and family Conversation partners present during encounter: Nurse Referral source: Other (comment) Reason for visit: End-of-life OnCall Visit: Yes   SPIRITUAL FRAMEWORK      GOALS       INTERVENTIONS        INTERVENTION OUTCOMES      SPIRITUAL CARE PLAN        If immediate needs arise, please contact [facility] 24 hour on call [phone]   Rana Davis  12/06/2024 8:47 PM

## 2024-12-06 NOTE — Progress Notes (Signed)
 " PROGRESS NOTE    Tara Hanna  FMW:[id number] DOB: 06-18-1976 DOA: 11/26/2024 PCP: System, Provider Not In  Subjective: Noted to be lethargic overnight with worsening hypoxia transitioned to high flow [place] and transferred to MICU. Seen and examined at bedside on two separate occasions. Appears lethargic and in acute respiratory distress. Unable to have a full conversation due to acuity of illness.    Hospital Course: 49 y.o. female with medical history of HTN, metastatic breast cancer with metastasis to liver, brain and bone on chemotherapy (last chemo about 4 days prior to admission). Breast cancer was diagnosed in 2023 and she has recurrent and metastatic disease on palliative chemotherapy with doxorubicin  every 28 days. Last chemo was on 11/22/2024. She presented to the hospital with shortness of breath, abdominal and back pain. She was diagnosed with possible severe sepsis secondary to community-acquired pneumonia.    Assessment and Plan:  Metastatic breast cancer  Metastatic disease to the liver, brain, bone:  Continue Keppra  for seizure prophylaxis. S/p 1 cycle of Doxil  on 11/22/2024 Patient is not a good candidate for any metastatic cancer treatments as per oncology and radiation oncology teams Patient/family still interested in radiation therapy for brain metastasis  Patient/family want second opinion Unfortunately, [facility] does not have any available beds for patient to be transferred for oncology evaluation and second opinion. Patient remains severely ill to be able to get discharged and taken to a tertiary care center at this time Dr. Lenn, radiation oncologist, following Oncology following   Lactic acidosis Acute metabolic acidosis Waxing and waning Likely in the setting of metastatic liver disease with impaired function/clearance Lactate is unreliable marker of tissue hypoperfusion in this patient with known metastatic liver disease Concern for impending cardiac arrest in  the setting of likely worsening acute metabolic acidosis S/p treatment with IV fluids earlier during admission. VBG compensated at present but serum bicarb decreasing Cont IV sodium bicarbonate  Will trend lactate Trend ABG q6h   Tachycardia Patient with intermittent shortness of breath and worsening hypoxia HR in 110 -140s Tele with NSR and occasional PVCs Cont on metoprolol  12.5mg  q6h with holding parameters See management of metabolic acidosis as elsewhere Monitor on tele  Severe sepsis  Possible hospital-acquired pneumonia:  Patient with shortness of breath and worsening hypoxia Status post 5-day course on antibiotics for CAP, finished on 11/30/24 MRSA screen is negative Bcx 1/3 with NGTD, final read pending Will start empiric vancomycin  and zosyn  Hold off on sepsis fluids given concern for fluid overload state Will get repeat blood cultures   Anemia of chronic disease Pancytopenia:  Possibly due to malignancy. Recent chemotherapy likely contributed to pancytopenia Recent PNA may have also contributed a little but less likely given no improvement in counts despite antibiotic course WBC 0.8 < 0.5 Hemoglobin 7.9 < 8.5  Platelets 12 < 13 Iron studies inconsistent with iron deficiency anemia.  Ferritin level greater than 7500, saturation ratio 49, iron level 107. Status post 6 units platelets and 1 unit pRBC already Hold off on further transfusions for today Monitor CBC  Oncology following   Acute embolic stroke Metastatic brain disease  Noted on MRI brain 11/29/24 CTA head/neck 11/30/24 with no LVO or significant stenosis in head/neck Venous US  bilateral arms and legs with no acute DVT TTE w/ bubble study positive for PFO Severe thrombocytopenia cotinues to make use of antiplatelets/anticoagulation and consideration for PFO closure a challenge Cont keppra  as elsewhere for seizure prophylaxis SLP signed off PT/OT following Neurology following Hematology/oncology following  Sinus tachycardia:  Persistent in nature Likely from underlying acute illness.  Suspect anxiety could be contributing to this.  Chart review shows she was tachycardic with heart rate of 126 at her oncologist office on 11/22/2024. Monitor clinically     Elevated liver enzymes:  Likely due to metastatic liver disease. Monitor clinically   Anxiety At home on xanax  TID PRN Cont as BID PRN for now   Hyponatremia: Improved.  DVT prophylaxis: Place and maintain sequential compression device Start: 11/29/24 0851 SCDs Start: 11/26/24 1706  SCDs   Code Status: Full Code Family Communication: updated family at bedside Disposition Plan: TBD Reason for continuing need for hospitalization: severity of illness  Objective: Vitals:   12/06/24 0900 12/06/24 1000 12/06/24 1100 12/06/24 1200  BP: 106/64 112/69 114/70 (!) 121/51  Pulse: (!) 107 (!) 107 (!) 110 (!) 114  Resp: 12 (!) 29 (!) 30 (!) 29  Temp:    97.8 F (36.6 C)  TempSrc:    Axillary  SpO2: 99% 98% 96% 95%  Weight:      Height:        Intake/Output Summary (Last 24 hours) at 12/06/2024 1500 Last data filed at 12/05/2024 2214 Gross per 24 hour  Intake 10 ml  Output --  Net 10 ml   Filed Weights   12/03/24 0411 12/04/24 0500 12/05/24 0645  Weight: 103.7 kg 103.1 kg 102.7 kg    Examination:  Physical Exam Vitals and nursing note reviewed.  Constitutional:      General: She is in acute distress.     Appearance: She is ill-appearing.     Comments: Somnolent, frail  Cardiovascular:     Rate and Rhythm: Regular rhythm. Tachycardia present.     Pulses: Normal pulses.  Pulmonary:     Effort: Respiratory distress present.     Breath sounds: Rales present. No wheezing.  Abdominal:     General: Bowel sounds are normal. There is no distension.     Palpations: Abdomen is soft.     Tenderness: There is no abdominal tenderness.  Musculoskeletal:     Right lower leg: Edema present.     Left lower leg: Edema present.   Neurological:     Mental Status: She is alert.     Data Reviewed: I have personally reviewed following labs and imaging studies  CBC: Recent Labs  Lab 11/30/24 0834 11/30/24 1617 12/01/24 0412 12/02/24 0449 12/02/24 2012 12/03/24 0606 12/04/24 0516 12/04/24 0816 12/04/24 2132 12/05/24 0242 12/06/24 0357  WBC 2.8*   < > 2.6*   < > 1.0*   < > 0.5* 0.5* 0.5* 0.5* 0.8*  NEUTROABS 2.3  --  2.1  --  0.8*  --   --  0.2*  --   --  0.3*  HGB 8.5*   < > 8.7*   < > 7.5*   < > 6.8* 7.2* 8.1* 8.5* 7.9*  HCT 26.0*   < > 26.2*   < > 22.0*   < > 19.6* 21.0* 23.5* 24.4* 23.3*  MCV 73.4*   < > 73.4*   < > 70.5*   < > 68.5* 69.8* 70.4* 70.1* 71.3*  PLT 10*   < > 12*   < > 15*   < > 13* 10* 13* 12* 12*   < > = values in this interval not displayed.   Basic Metabolic Panel: Recent Labs  Lab 12/04/24 0516 12/04/24 1728 12/05/24 0242 12/05/24 1020 12/06/24 0357  NA 134* 137 138 137 140  K 5.0 4.5 4.3 4.7 4.3  CL 95* 97* 96* 97* 99  CO2 17* 21* 21* 19* 23  GLUCOSE 99 103* 112* 116* 101*  BUN 13 14 15 18  22*  CREATININE 0.66 0.64 0.70 0.81 0.86  CALCIUM 8.5* 8.1* 8.2* 8.4* 8.1*   GFR: Estimated Creatinine Clearance: 92.3 mL/min (by C-G formula based on SCr of 0.86 mg/dL). Liver Function Tests: Recent Labs  Lab 11/30/24 0541 12/02/24 0449 12/04/24 0516 12/04/24 1728 12/06/24 0357  AST 486* 663* 1,015* 1,037* 1,193*  ALT 362* 341* 373* 376* 426*  ALKPHOS 515* 467* 489* 476* 531*  BILITOT 1.2 1.5* 3.1* 3.8* 5.5*  PROT 5.8* 5.4* 5.4* 5.1* 5.1*  ALBUMIN 2.6* 2.5* 2.6* 2.5* 2.5*   No results for input(s): LIPASE, AMYLASE in the last 168 hours. No results for input(s): AMMONIA in the last 168 hours. Coagulation Profile: No results for input(s): INR, PROTIME in the last 168 hours. Cardiac Enzymes: No results for input(s): CKTOTAL, CKMB, CKMBINDEX, TROPONINI in the last 168 hours. ProBNP, BNP (last 5 results) No results for input(s): PROBNP, BNP in the last  8760 hours. HbA1C: No results for input(s): HGBA1C in the last 72 hours. CBG: Recent Labs  Lab 11/29/24 1706 12/05/24 2258  GLUCAP 79 100*   Lipid Profile: No results for input(s): CHOL, HDL, LDLCALC, TRIG, CHOLHDL, LDLDIRECT in the last 72 hours. Thyroid  Function Tests: No results for input(s): TSH, T4TOTAL, FREET4, T3FREE, THYROIDAB in the last 72 hours. Anemia Panel: No results for input(s): VITAMINB12, FOLATE, FERRITIN, TIBC, IRON, RETICCTPCT in the last 72 hours. Sepsis Labs: Recent Labs  Lab 12/04/24 1728 12/05/24 0242 12/05/24 1020 12/06/24 0357  LATICACIDVEN 8.1* 7.7* >9.0* 6.8*    Recent Results (from the past 240 hours)  MRSA Next Gen by PCR, Nasal     Status: None   Collection Time: 11/29/24  8:31 PM   Specimen: Nasal Mucosa; Nasal Swab  Result Value Ref Range Status   MRSA by PCR Next Gen NOT DETECTED NOT DETECTED Final    Comment: (NOTE) The GeneXpert MRSA Assay (FDA approved for NASAL specimens only), is one component of a comprehensive MRSA colonization surveillance program. It is not intended to diagnose MRSA infection nor to guide or monitor treatment for MRSA infections. Test performance is not FDA approved in patients less than 56 years old. Performed at [facility], [address] [place], [place] [address]   MRSA Next Gen by PCR, Nasal     Status: None   Collection Time: 12/06/24 10:21 AM   Specimen: Nasal Mucosa; Nasal Swab  Result Value Ref Range Status   MRSA by PCR Next Gen NOT DETECTED NOT DETECTED Final    Comment: (NOTE) The GeneXpert MRSA Assay (FDA approved for NASAL specimens only), is one component of a comprehensive MRSA colonization surveillance program. It is not intended to diagnose MRSA infection nor to guide or monitor treatment for MRSA infections. Test performance is not FDA approved in patients less than 67 years old. Performed at [facility], [address],  [place], [place] [address]      Radiology Studies: DG Chest [place] 1 View Result Date: 12/05/2024 EXAM: 1 VIEW(S) XRAY OF THE CHEST 12/05/2024 11:30:00 AM COMPARISON: 11/26/2024 CLINICAL HISTORY: Dyspnea FINDINGS: LINES, TUBES AND DEVICES: Right chest Port-A-Cath in place with tip projecting over the right atrium. LUNGS AND PLEURA: Low lung volumes. Patchy bilateral airspace opacities, increased since prior exam. No pleural effusion. No pneumothorax. HEART AND MEDIASTINUM: No acute abnormality of the  cardiac and mediastinal silhouettes. BONES AND SOFT TISSUES: No acute osseous abnormality. IMPRESSION: 1. Patchy bilateral airspace opacities, increased since prior exam. 2. Low lung volumes. Electronically signed by: Waddell Calk MD MD 12/05/2024 01:09 PM EST RP Workstation: [id number]    Scheduled Meds:  [START ON 2024-12-16] Chlorhexidine  Gluconate Cloth  6 each Topical QHS   filgrastim  (NIVESTYM ) SQ  480 mcg Subcutaneous Q2000   furosemide   20 mg Intravenous Once   levETIRAcetam   500 mg Intravenous BID   magic mouthwash w/lidocaine   10 mL Oral QID   sodium chloride  flush  10-40 mL Intracatheter Q12H   sodium chloride  flush  10-40 mL Intracatheter Q12H   Continuous Infusions:  piperacillin -tazobactam (ZOSYN )  IV 3.375 g (12/06/24 1218)   sodium bicarbonate  150 mEq in dextrose  5 % 1,150 mL infusion Stopped (12/05/24 0042)   vancomycin        LOS: 9 days   Norval Bar, MD  [facility]  12/06/2024, 3:00 PM   "

## 2024-12-06 NOTE — Consult Note (Signed)
" ° °  CHIEF COMPLAINT:   Chief Complaint  Patient presents with   Shortness of Breath   Abdominal Pain   Back Pain   HPI   METASTATIC BREAST CANCER TO LIVER, LUNGS, BONE, BRAIN PROGRESSIVE DISEASE TRANSFERRED TO ICU FOR SEVERE ACIDOSIS AND ENCEPAHLOPATHY PATIENT IS IN THE DYING PROCESS  Subjective  WOB and resp distress     Objective     REVIEW OF SYSTEMS  PATIENT IS UNABLE TO PROVIDE COMPLETE REVIEW OF SYSTEMS DUE TO SEVERE CRITICAL ILLNESS   PHYSICAL EXAMINATION:  GENERAL:critically ill appearing, +resp distress MOUTH: oral ulcers NECK: Supple.  PULMONARY: Lungs clear to auscultation, +rhonchi, +wheezing CARDIOVASCULAR: S1 and S2.  Regular rate and rhythm NEUROLOGIC: obtunded   VITALS:  height is 5' 4 (1.626 m) and weight is 102.7 kg. Her axillary temperature is 97.6 F (36.4 C). Her blood pressure is 111/65 and her pulse is 117 (abnormal). Her respiration is 35 (abnormal) and oxygen saturation is 94%.   I personally reviewed Labs under Results section.  Radiology Reports DG Chest Port 1 View Result Date: 12/05/2024 EXAM: 1 VIEW(S) XRAY OF THE CHEST 12/05/2024 11:30:00 AM COMPARISON: 11/26/2024 CLINICAL HISTORY: Dyspnea FINDINGS: LINES, TUBES AND DEVICES: Right chest Port-A-Cath in place with tip projecting over the right atrium. LUNGS AND PLEURA: Low lung volumes. Patchy bilateral airspace opacities, increased since prior exam. No pleural effusion. No pneumothorax. HEART AND MEDIASTINUM: No acute abnormality of the cardiac and mediastinal silhouettes. BONES AND SOFT TISSUES: No acute osseous abnormality. IMPRESSION: 1. Patchy bilateral airspace opacities, increased since prior exam. 2. Low lung volumes. Electronically signed by: Waddell Calk MD MD 12/05/2024 01:09 PM EST RP Workstation: [id number]      Assessment/Plan:   PROGRESSIVE METASTATIC BREAST CANCER  PROCESS OF DYING AND SUFFUCATION GOALS OF CARE DISCUSSION  The Clinical status was relayed to family  in detail-Husband and Daughter  Updated and notified of patients medical condition- Patient remains unresponsive and will not open eyes to command.   Patient with increased WOB and using accessory muscles to breathe Explained to family course of therapy and the modalities  Patient with Progressive multiorgan failure with a very high probablity of a very minimal chance of meaningful recovery despite all aggressive and optimal medical therapy.   They Have consented and agreed to DNR/DNI status and will be leaning towards comfort care measures    Critical Care Time devoted to patient care services described in this note is 95 minutes.  Critical care was necessary to treat /prevent imminent and life-threatening deterioration. Overall, patient is critically ill, prognosis is guarded.  Patient with Multiorgan failure and at high risk for cardiac arrest and death.    Nickolas Alm Cellar, M.D.  [facility]  Medical Director [facility]        "

## 2024-12-06 NOTE — Progress Notes (Signed)
 Per Dr. Norval okay for RN to change diet order to NPO. Pt is having trouble swallowing and its a risk for aspiration. MD has educated family regarding pt being NPO due to high risk of aspiration.

## 2024-12-06 NOTE — Progress Notes (Signed)
 SPIRITUAL CARE AND COUNSELING CONSULT NOTE   VISIT SUMMARY    SPIRITUAL ENCOUNTER                                                                                                                                                                      Type of Visit: Follow up Care provided to:: Patient, Friend Referral source: Chaplain assessment Reason for visit: Routine spiritual support OnCall Visit: No   SPIRITUAL FRAMEWORK      GOALS       INTERVENTIONS   Spiritual Care Interventions Made: Compassionate presence, Other (comment) (for Friend)    INTERVENTION OUTCOMES   Outcomes: Awareness of support, Other (comment) (for Friend)  SPIRITUAL CARE PLAN        If immediate needs arise, please contact [facility] 24 hour on call [phone]   Ronal Cathlean Croak  12/06/2024 3:55 PM

## 2024-12-06 NOTE — Progress Notes (Signed)
 PT Cancellation Note  Patient Details Name: Tara Hanna MRN: [id number] DOB: 06-14-1976   Cancelled Treatment:     Reason Eval/Treat Not Completed: Pt transitioned to CCU for higher level of care and change in medical status. No continue therapy orders placed. PT to sign off. Please re-consult if appropriate. Per chart review appears to be goals of care conversations at this time.    [name] 12/06/2024, 2:49 PM

## 2024-12-06 NOTE — Progress Notes (Addendum)
 "                                                                                                                                                                                               Palliative Care Progress Note, Assessment & Plan   Patient Name: Tara Hanna       Date: 12/06/2024 DOB: 02-Jul-1976  Age: 49 y.o. MRN#: [id number] Attending Physician: Cosette Blackwater, MD Primary Care Physician: System, Provider Not In Admit Date: 11/26/2024  Subjective: Patient is sitting up in bed, awake but lethargic.  She is fidgeting and moaning.  She does not make intelligible words.  She is not able to engage in discussions appropriately with me.  No family or friends are present at bedside during my visit.  HPI: 49 y.o. female  with past medical history significant for HTN and metastatic breast cancer with metastasis to liver, brain and bone on chemotherapy. Patient presented to ED 11/26/2024 from home c/o abdominal distention/pain, back pain and DOE with shortness of breath at rest. Patient reports last chemo was 11/22/24.    ED labs significant for anion gap 17, Alk phos 759, albumin 3.4, AST 617, ALT 503. Lactic acid 4.4-->3.2-->3.6-->3.5.  Hgb 10.4, Hct 31.7, plts 61. D dimer >20   ED vitals 90/70, HR 136, SpO2 97% 2L, RR 24, 98.34F   CTA chest demonstrated: 1. No evidence of pulmonary embolism. 2. Stable left upper lobe nodular airspace disease and several small pulmonary nodules. 3. Stable bilateral hilar and mediastinal adenopathy. 4. 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 5. Numerous hepatic metastases, better seen on earlier CT.   TRH was consulted for admission and management of severe sepsis 2/2 CAP, thrombocytopenia, anemia of chronic disease, elevated liver enzymes, hyponatremia and breast cancer with metastasis to liver, bone and brain.    Palliative medicine team was  consulted for assistance with goals of care conversations.   Following up today for continued discussion of code status and boundaries of medical care.   Summary of counseling/coordination of care: Chart review completed prior to meeting patient including:  -Labs: pH 7.44 - lactice acid 6.8, WBC 0.8, platelets 12, ALT 426/AST 1,193, alk phos 531, albumin 2.5, hemoglobin 7.9, total bilirubin 5.5 -Vital signs: Blood pressures have remained stable, patient's respiratory rate has fluctuated from 30 to 40-50, her heart rate remains tachycardic -Progress notes: Reviewed note from RN when patient's diet was changed to n.p.o. given she was having trouble swallowing and is at risk for aspiration, note that patient's family was  updated at bedside by attending Dr.Tariq no change to plan of care from earlier this morning  After reviewing the patient's chart and assessing the patient at bedside, I spoke with patient in regards to symptom management and goals of care.   Patient remains confused, moaning, groaning, and unable to participate in goals of care medical decision making independently at this time.  During my visit, RN administered Ativan  2 attempts to calm patient and address her agitation and anxiety.  I remained bedside and patient eventually became more calm/less fidgeting.  After visiting with the patient, I attempted to speak with her husband over the phone.  No answer.  HIPAA compliant voicemail with PMT contact info given.  Received message from nurse later in the day that patient's respiratory rate was increasing and her agitation remained despite giving Dilaudid  and Ativan .  I returned bedside to assess the patient.  Her respiratory rates remains elevated in the 30s, she remains tachycardic.  Counseled with RN and attending.  Attending ordered additional Lasix  and lab work.  Shared concern with RN that patient is deteriorating despite maximizing medical effort.  She remains a full code and  therefore anything and everything was be done to sustain her life.  However, I am hopeful family returned my phone call and we can continue goals of care discussion given patient is declining despite medical efforts.  Awaiting return phone call from patient's husband.  MD aware of my attempts to speak with family with no return phone call today.  Full code and full scope remain.  PMT will continue to follow and support.  Physical Exam HENT:     Head: Normocephalic.  Eyes:     Comments: Left eye droop  Cardiovascular:     Rate and Rhythm: Normal rate.     Pulses: Normal pulses.  Pulmonary:     Effort: Pulmonary effort is normal.     Comments: 10L [place] in place Skin:    General: Skin is warm and dry.  Neurological:     Mental Status: She is alert.     Comments: Mumbles in comprehensible words Unable to complete orientation assessment  Psychiatric:        Behavior: Behavior is agitated.     Comments: Fidgeting, moaning              Recommendations:   Full code/full scope High risk for intubation  I personally spent a total of 35 minutes in the care of the patient today including preparing to see the patient, getting/reviewing separately obtained history, performing a medically appropriate exam/evaluation, referring and communicating with other health care professionals, and documenting clinical information in the EHR.   Tara L. Arvid, DNP, FNP-BC Palliative Medicine Team   "

## 2024-12-06 NOTE — Consult Note (Addendum)
 Pharmacy Antibiotic Note  Tara Hanna is a 49 y.o. female admitted on 11/26/2024 with pneumonia. Pt with history of breast cancer with metastases to brain, liver and bone on chemotherapy (doxorubicin  - last dose 12/30). Pt is neutropenic with ANC 300 cells/uL. Pt presenting with SOB and hypoxia (90% on RA), was treated for CAP 1/3 > 1/7, no abx since. 1/3 CXR showing opacity in right lung base, 1/3 chest CT showing left upper lobe possible infiltrate. CXR 1/12 shows bilateral airspace opacities, increased since previous exam. Pharmacy has been consulted for vancomycin  and Zosyn  dosing.   Plan: - Give Vancomycin  2000mg  IV loading dose x 1 - Start Vancomycin  750mg  IV Q12h thereafter - Initiate Zosyn  3.375 g Q8h infused over 4 hours - F/u on patient's clinical response to therapy - Monitor renal function - Evaluate opportunities to de-escalate antimicrobial therapy as indicated   Height: 5' 4 (162.6 cm) Weight: 102.7 kg (226 lb 6.6 oz) IBW/kg (Calculated) : 54.7  Temp (24hrs), Avg:97.9 F (36.6 C), Min:97.4 F (36.3 C), Max:98.7 F (37.1 C)  Recent Labs  Lab 12/04/24 0516 12/04/24 0816 12/04/24 1728 12/04/24 2132 12/05/24 0242 12/05/24 1020 12/06/24 0357  WBC 0.5* 0.5*  --  0.5* 0.5*  --  0.8*  CREATININE 0.66  --  0.64  --  0.70 0.81 0.86  LATICACIDVEN >9.0*  --  8.1*  --  7.7* >9.0* 6.8*    Estimated Creatinine Clearance: 92.3 mL/min (by C-G formula based on SCr of 0.86 mg/dL).    Allergies[1]  Antimicrobials this admission: Vanc + cefepime  + metronidazole  x1 in ED 11/26/24 Ceftriaxone  2g 1/3 >> 1/7 Azithromycin  500mg  1/3 >> 1/7  Dose adjustments this admission: N/A  Microbiology results: 1/3 BCx: no growth 1/6 MRSA PCR: negative  Thank you for allowing pharmacy to be a part of this patients care.  Tara Hanna, PharmD Candidate 12/06/2024 9:07 AM     [1] No Known Allergies

## 2024-12-06 NOTE — Progress Notes (Signed)
 OT Cancellation Note  Patient Details Name: Tara Hanna MRN: [id number] DOB: Sep 04, 1976   Cancelled Treatment:    Reason Eval/Treat Not Completed: Other (comment). Pt transitioned to CCU for higher level of care and change in medical status. No continue therapy orders placed. OT to sign off. Please re-consult if appropriate. Per chart review appears to be goals of care conversations at this time.   Izetta Claude, MS, OTR/L , CBIS ascom [phone]  12/06/2024, 9:21 AM

## 2024-12-06 NOTE — Plan of Care (Signed)
" °  Problem: Elimination: Goal: Will not experience complications related to urinary retention Outcome: Progressing   Problem: Skin Integrity: Goal: Risk for impaired skin integrity will decrease Outcome: Progressing   Problem: Coping: Goal: Will identify appropriate support needs Outcome: Progressing   Problem: Clinical Measurements: Goal: Ability to maintain clinical measurements within normal limits will improve Outcome: Not Progressing Goal: Diagnostic test results will improve Outcome: Not Progressing Goal: Respiratory complications will improve Outcome: Not Progressing Goal: Cardiovascular complication will be avoided Outcome: Not Progressing   Problem: Activity: Goal: Risk for activity intolerance will decrease Outcome: Not Progressing   Problem: Nutrition: Goal: Adequate nutrition will be maintained Outcome: Not Progressing   Problem: Coping: Goal: Level of anxiety will decrease Outcome: Not Progressing   Problem: Pain Managment: Goal: General experience of comfort will improve and/or be controlled Outcome: Not Progressing   "

## 2024-12-06 NOTE — IPAL (Signed)
" °  Interdisciplinary Goals of Care Family Meeting   Date carried out: 12/06/2024  Location of the meeting: Bedside  Member's involved: Physician, Nurse Practitioner, Bedside Registered Nurse, and Family Member or next of kin  Durable Power of Attorney or acting medical decision maker: family- children present    Discussion: We discussed goals of care for Tara Hanna. Multiple conversations today, family is ready to transition to comfort measures at this time.  Code status:   Code Status: Do not attempt resuscitation (DNR) - Comfort care   Disposition: In-patient comfort care  Time spent for the meeting: 15 minutes    Tara Hanna, AGACNP-BC Acute Care Nurse Practitioner [facility]   [phone] / [phone] Please see Amion for details.    "

## 2024-12-06 NOTE — Plan of Care (Signed)
  Problem: Clinical Measurements: Goal: Cardiovascular complication will be avoided Outcome: Progressing   Problem: Elimination: Goal: Will not experience complications related to urinary retention Outcome: Progressing   Problem: Pain Managment: Goal: General experience of comfort will improve and/or be controlled Outcome: Progressing

## 2024-12-07 ENCOUNTER — Ambulatory Visit

## 2024-12-07 DIAGNOSIS — J189 Pneumonia, unspecified organism: Secondary | ICD-10-CM | POA: Diagnosis not present

## 2024-12-07 DIAGNOSIS — A419 Sepsis, unspecified organism: Secondary | ICD-10-CM | POA: Diagnosis not present

## 2024-12-07 MED ORDER — ORAL CARE MOUTH RINSE: 15.0000 mL | OROMUCOSAL | Status: DC

## 2024-12-07 MED ORDER — ORAL CARE MOUTH RINSE: 15.0000 mL | OROMUCOSAL | Status: DC | PRN

## 2024-12-08 ENCOUNTER — Ambulatory Visit

## 2024-12-09 ENCOUNTER — Ambulatory Visit

## 2024-12-11 LAB — CULTURE, BLOOD (ROUTINE X 2)
Culture: NO GROWTH
Culture: NO GROWTH
Special Requests: ADEQUATE
Special Requests: ADEQUATE

## 2024-12-12 ENCOUNTER — Ambulatory Visit

## 2024-12-13 ENCOUNTER — Ambulatory Visit

## 2024-12-14 ENCOUNTER — Ambulatory Visit

## 2024-12-15 ENCOUNTER — Ambulatory Visit

## 2024-12-16 ENCOUNTER — Ambulatory Visit

## 2024-12-19 ENCOUNTER — Ambulatory Visit

## 2024-12-19 ENCOUNTER — Inpatient Hospital Stay

## 2024-12-19 ENCOUNTER — Inpatient Hospital Stay: Admitting: Internal Medicine

## 2024-12-25 NOTE — Progress Notes (Signed)
 Patient found without respirations, pulses, or heart sounds, Verified 2nd by Lesley Shams, RN. Dr. Devon and Adolph, NP notified. AC also notified.

## 2024-12-25 NOTE — Progress Notes (Signed)
 Telephoned patients husband, Tara Hanna, to notify him of a significant change with the patient. He replied that he would try to make it back up here to the hospital.

## 2024-12-25 NOTE — Discharge Summary (Signed)
 " [facility] Phys[facility] Discharge Summary   Patient name: Tara Hanna  Admit date:     11/26/2024  Discharge date: 2024-12-28  Attending Physician: JENS AIDA HOLIDAY  Discharge Physician: Norval Bar   PCP: System, Provider Not In  Admitted From: Home   Disposition:  Deceased  Recommendations for Outpatient Follow-up:  Not applicable  Home Health:No Equipment/Devices: @ECDMELIST @  Discharge Condition:Deceased CODE STATUS:Comfort Care Diet recommendation: Not applicable Fluid Restriction: None  Hospital Summary: 49 y.o. female with medical history of HTN, metastatic breast cancer with metastasis to liver, brain and bone on chemotherapy (last chemo about 4 days prior to admission). Breast cancer was diagnosed in 2023 and she has recurrent and metastatic disease on palliative chemotherapy with doxorubicin  every 28 days. Last chemo was on 11/22/2024. She presented to the hospital with shortness of breath, abdominal and back pain. She was diagnosed with possible severe sepsis secondary to community-acquired pneumonia.   Hospital course complicated by progressive liver failure in the setting of metastatic disease with resulting severe metabolic acidosis, and severe thrombocytopenia. Patient developed acute stroke and found to have PFO for which no treatment could be initiated given severely low platelets requiring frequent transfusions. Patient eventually developed fluid overload state with impending respiratory failure. Extensive talks with patient and family throughout the hospitalization by myself, palliative care team, oncology, radiation oncology, and PCCM. Patient developed acute encephalopathy in the setting of impending respiratory failure with inability to manage secretions. Family agreed to transition to comfort care measures. Patient passed away and was pronounced by nursing on 12-28-24 at 0415.  Discharge Diagnoses:  Principal Problem:   Sepsis due to pneumonia  [facility]) Active Problems:   Cancer of right breast metastatic to brain [facility])   Essential hypertension   Palliative care encounter   Discharge Instructions   Allergies as of 2024/12/28   No Known Allergies      Medication List     ASK your doctor about these medications    albuterol  108 (90 Base) MCG/ACT inhaler Commonly known as: VENTOLIN  HFA Inhale 2-4 puffs by mouth every 4 hours as needed for wheezing, cough, and/or shortness of breath   ALPRAZolam  0.25 MG tablet Commonly known as: XANAX  Take 1 tablet (0.25 mg total) by mouth 3 (three) times daily as needed for anxiety.   famotidine  20 MG tablet Commonly known as: PEPCID  TAKE 1 TABLET BY MOUTH TWICE A DAY   hydrochlorothiazide 25 MG tablet Commonly known as: HYDRODIURIL Take 25 mg by mouth daily.   levETIRAcetam  500 MG tablet Commonly known as: Keppra  Take 1 tablet (500 mg total) by mouth 2 (two) times daily.   predniSONE  20 MG tablet Commonly known as: DELTASONE  Take 1 tablet (20 mg total) by mouth daily with breakfast. Once a day with food x 10 days.        Allergies[1]  Discharge Exam: Vitals:   12-28-24 0300 12-28-2024 0400  BP:    Pulse:    Resp: (!) 24 (!) 5  Temp:    SpO2:      Physical Exam Vitals reviewed: Patient examined and pronounced by nursing team prior to the start of my shift on December 28, 2024.     The results of significant diagnostics from this hospitalization (including imaging, microbiology, ancillary and laboratory) are listed below for reference.    Microbiology: Recent Results (from the past 240 hours)  MRSA Next Gen by PCR, Nasal     Status: None   Collection Time: 11/29/24  8:31 PM   Specimen:  Nasal Mucosa; Nasal Swab  Result Value Ref Range Status   MRSA by PCR Next Gen NOT DETECTED NOT DETECTED Final    Comment: (NOTE) The GeneXpert MRSA Assay (FDA approved for NASAL specimens only), is one component of a comprehensive MRSA colonization surveillance program. It is not  intended to diagnose MRSA infection nor to guide or monitor treatment for MRSA infections. Test performance is not FDA approved in patients less than 75 years old. Performed at [facility], [address] [place], [place] [address]   MRSA Next Gen by PCR, Nasal     Status: None   Collection Time: 12/06/24 10:21 AM   Specimen: Nasal Mucosa; Nasal Swab  Result Value Ref Range Status   MRSA by PCR Next Gen NOT DETECTED NOT DETECTED Final    Comment: (NOTE) The GeneXpert MRSA Assay (FDA approved for NASAL specimens only), is one component of a comprehensive MRSA colonization surveillance program. It is not intended to diagnose MRSA infection nor to guide or monitor treatment for MRSA infections. Test performance is not FDA approved in patients less than 55 years old. Performed at [facility], [address] [place], [place] [address]   Culture, blood (Routine X 2) w Reflex to ID Panel     Status: None (Preliminary result)   Collection Time: 12/06/24  4:15 PM   Specimen: BLOOD  Result Value Ref Range Status   Specimen Description BLOOD BLOOD RIGHT HAND  Final   Special Requests   Final    BOTTLES DRAWN AEROBIC AND ANAEROBIC Blood Culture adequate volume   Culture   Final    NO GROWTH < 24 HOURS Performed at [facility], [address] [place], [place] [address]    Report Status PENDING  Incomplete  Culture, blood (Routine X 2) w Reflex to ID Panel     Status: None (Preliminary result)   Collection Time: 12/06/24  4:15 PM   Specimen: BLOOD  Result Value Ref Range Status   Specimen Description BLOOD BLOOD LEFT HAND  Final   Special Requests   Final    BOTTLES DRAWN AEROBIC AND ANAEROBIC Blood Culture adequate volume   Culture   Final    NO GROWTH < 24 HOURS Performed at [facility], [address] [place], [place] [address]    Report Status PENDING  Incomplete     Labs: ProBNP, BNP (last 5 results) No results for input(s):  PROBNP, BNP in the last 8760 hours. Basic Metabolic Panel: Recent Labs  Lab 12/04/24 0516 12/04/24 1728 12/05/24 0242 12/05/24 1020 12/06/24 0357  NA 134* 137 138 137 140  K 5.0 4.5 4.3 4.7 4.3  CL 95* 97* 96* 97* 99  CO2 17* 21* 21* 19* 23  GLUCOSE 99 103* 112* 116* 101*  BUN 13 14 15 18  22*  CREATININE 0.66 0.64 0.70 0.81 0.86  CALCIUM 8.5* 8.1* 8.2* 8.4* 8.1*   Liver Function Tests: Recent Labs  Lab 12/02/24 0449 12/04/24 0516 12/04/24 1728 12/06/24 0357  AST 663* 1,015* 1,037* 1,193*  ALT 341* 373* 376* 426*  ALKPHOS 467* 489* 476* 531*  BILITOT 1.5* 3.1* 3.8* 5.5*  PROT 5.4* 5.4* 5.1* 5.1*  ALBUMIN 2.5* 2.6* 2.5* 2.5*   No results for input(s): LIPASE, AMYLASE in the last 168 hours. No results for input(s): AMMONIA in the last 168 hours. CBC: Recent Labs  Lab 12/01/24 0412 12/02/24 0449 12/02/24 2012 12/03/24 0606 12/04/24 0516 12/04/24 0816 12/04/24 2132 12/05/24 0242 12/06/24 0357  WBC  2.6*   < > 1.0*   < > 0.5* 0.5* 0.5* 0.5* 0.8*  NEUTROABS 2.1  --  0.8*  --   --  0.2*  --   --  0.3*  HGB 8.7*   < > 7.5*   < > 6.8* 7.2* 8.1* 8.5* 7.9*  HCT 26.2*   < > 22.0*   < > 19.6* 21.0* 23.5* 24.4* 23.3*  MCV 73.4*   < > 70.5*   < > 68.5* 69.8* 70.4* 70.1* 71.3*  PLT 12*   < > 15*   < > 13* 10* 13* 12* 12*   < > = values in this interval not displayed.   Cardiac Enzymes: No results for input(s): CKTOTAL, CKMB, CKMBINDEX, TROPONINI, TROPONINIHS in the last 168 hours. BNP: No results for input(s): BNP in the last 168 hours. CBG: Recent Labs  Lab 12/05/24 2258  GLUCAP 100*   D-Dimer No results for input(s): DDIMER in the last 72 hours. Hgb A1c No results for input(s): HGBA1C in the last 72 hours. Lipid Profile No results for input(s): CHOL, HDL, LDLCALC, TRIG, CHOLHDL, LDLDIRECT in the last 72 hours. Thyroid  function studies No results for input(s): TSH, T4TOTAL, FREET4, T3FREE, THYROIDAB in the last 72  hours.  Invalid input(s): FREET3 Anemia work up No results for input(s): VITAMINB12, FOLATE, FERRITIN, TIBC, IRON, RETICCTPCT in the last 72 hours. Urinalysis    Component Value Date/Time   COLORURINE YELLOW (A) 11/28/2024 2345   APPEARANCEUR HAZY (A) 11/28/2024 2345   LABSPEC 1.009 11/28/2024 2345   PHURINE 6.0 11/28/2024 2345   GLUCOSEU NEGATIVE 11/28/2024 2345   HGBUR NEGATIVE 11/28/2024 2345   BILIRUBINUR NEGATIVE 11/28/2024 2345   KETONESUR NEGATIVE 11/28/2024 2345   PROTEINUR NEGATIVE 11/28/2024 2345   NITRITE NEGATIVE 11/28/2024 2345   LEUKOCYTESUR NEGATIVE 11/28/2024 2345   Sepsis Labs Recent Labs  Lab 12/04/24 0816 12/04/24 2132 12/05/24 0242 12/06/24 0357  WBC 0.5* 0.5* 0.5* 0.8*    Procedures/Studies: DG Chest Port 1 View Result Date: 12/05/2024 EXAM: 1 VIEW(S) XRAY OF THE CHEST 12/05/2024 11:30:00 AM COMPARISON: 11/26/2024 CLINICAL HISTORY: Dyspnea FINDINGS: LINES, TUBES AND DEVICES: Right chest Port-A-Cath in place with tip projecting over the right atrium. LUNGS AND PLEURA: Low lung volumes. Patchy bilateral airspace opacities, increased since prior exam. No pleural effusion. No pneumothorax. HEART AND MEDIASTINUM: No acute abnormality of the cardiac and mediastinal silhouettes. BONES AND SOFT TISSUES: No acute osseous abnormality. IMPRESSION: 1. Patchy bilateral airspace opacities, increased since prior exam. 2. Low lung volumes. Electronically signed by: Waddell Calk MD MD 12/05/2024 01:09 PM EST RP Workstation: [id number]   ECHOCARDIOGRAM COMPLETE BUBBLE STUDY Result Date: 12/02/2024    ECHOCARDIOGRAM REPORT   Patient Name:   Tara Hanna Date of Exam: 12/02/2024 Medical Rec #:  [id number]       Height:       64.0 in Accession #:    [id number]      Weight:       229.3 lb Date of Birth:  12/24/1975      BSA:          2.073 m Patient Age:    48 years        BP:           115/80 mmHg Patient Gender: F               HR:           122 bpm. Exam Location:   [facility] Procedure: 2D Echo, Cardiac Doppler,  Color Doppler and Saline Contrast Bubble            Study (Both Spectral and Color Flow Doppler were utilized during            procedure). Indications:     Stroke  History:         Patient has prior history of Echocardiogram examinations, most                  recent 11/16/2024. Risk Factors:Hypertension. Breast Cancer.  Sonographer:     Philomena Daring Referring Phys:  [id number] NORVAL BAR Diagnosing Phys: Caron Poser IMPRESSIONS  1. Left ventricular ejection fraction, by estimation, is 50 to 55%. The left ventricle has low normal function. Left ventricular endocardial border not optimally defined to evaluate regional wall motion. There is mild left ventricular hypertrophy. Indeterminate diastolic filling due to E-A fusion.  2. Right ventricular systolic function is normal. The right ventricular size is normal. There is severely elevated pulmonary artery systolic pressure. The estimated right ventricular systolic pressure is 60.5 mmHg.  3. The mitral valve is grossly normal. Trivial mitral valve regurgitation. No evidence of mitral stenosis.  4. Tricuspid valve regurgitation is moderate.  5. The aortic valve has an indeterminant number of cusps. Aortic valve regurgitation is not visualized. No aortic stenosis is present.  6. The inferior vena cava is normal in size with greater than 50% respiratory variability, suggesting right atrial pressure of 3 mmHg.  7. Agitated saline contrast bubble study was positive with shunting observed within 3-6 cardiac cycles suggestive of interatrial shunt. Comparison(s): A prior study was performed on 11/16/2024. No significant change from prior study. FINDINGS  Left Ventricle: Left ventricular ejection fraction, by estimation, is 50 to 55%. The left ventricle has low normal function. Left ventricular endocardial border not optimally defined to evaluate regional wall motion. The left ventricular internal cavity  size was normal in size.  There is mild left ventricular hypertrophy. Indeterminate diastolic filling due to E-A fusion. Right Ventricle: The right ventricular size is normal. Right vetricular wall thickness was not well visualized. Right ventricular systolic function is normal. There is severely elevated pulmonary artery systolic pressure. The tricuspid regurgitant velocity is 3.79 m/s, and with an assumed right atrial pressure of 3 mmHg, the estimated right ventricular systolic pressure is 60.5 mmHg. Left Atrium: Left atrial size was normal in size. Right Atrium: Right atrial size was normal in size. Pericardium: There is no evidence of pericardial effusion. Presence of epicardial fat layer. Mitral Valve: The mitral valve is grossly normal. Trivial mitral valve regurgitation. No evidence of mitral valve stenosis. Tricuspid Valve: The tricuspid valve is normal in structure. Tricuspid valve regurgitation is moderate . No evidence of tricuspid stenosis. Aortic Valve: The aortic valve has an indeterminant number of cusps. Aortic valve regurgitation is not visualized. No aortic stenosis is present. Pulmonic Valve: The pulmonic valve was not well visualized. Pulmonic valve regurgitation is mild. No evidence of pulmonic stenosis. Aorta: The aortic root and ascending aorta are structurally normal, with no evidence of dilitation. Venous: The inferior vena cava is normal in size with greater than 50% respiratory variability, suggesting right atrial pressure of 3 mmHg. IAS/Shunts: The interatrial septum was not well visualized. Agitated saline contrast was given intravenously to evaluate for intracardiac shunting. Agitated saline contrast bubble study was positive with shunting observed within 3-6 cardiac cycles suggestive of interatrial shunt.  LEFT VENTRICLE PLAX 2D LVIDd:         2.90 cm   Diastology  LVIDs:         2.10 cm   LV e' medial:    5.98 cm/s LV PW:         1.00 cm   LV E/e' medial:  9.3 LV IVS:        1.20 cm   LV e' lateral:   9.36 cm/s  LVOT diam:     2.30 cm   LV E/e' lateral: 5.9 LV SV:         39 LV SV Index:   19 LVOT Area:     4.15 cm  RIGHT VENTRICLE RV S prime:     14.30 cm/s TAPSE (M-mode): 1.5 cm LEFT ATRIUM             Index       RIGHT ATRIUM          Index LA diam:        2.30 cm 1.11 cm/m  RA Area:     9.59 cm LA Vol (A2C):   16.8 ml 8.10 ml/m  RA Volume:   16.30 ml 7.86 ml/m LA Vol (A4C):   18.5 ml 8.92 ml/m LA Biplane Vol: 17.9 ml 8.63 ml/m  AORTIC VALVE LVOT Vmax:   78.30 cm/s LVOT Vmean:  53.700 cm/s LVOT VTI:    0.095 m  AORTA Ao Root diam: 2.70 cm MITRAL VALVE                TRICUSPID VALVE MV Area (PHT): 5.75 cm     TR Peak grad:   57.5 mmHg MV Decel Time: 132 msec     TR Vmax:        379.00 cm/s MV E velocity: 55.60 cm/s MV A velocity: 101.00 cm/s  SHUNTS MV E/A ratio:  0.55         Systemic VTI:  0.10 m                             Systemic Diam: 2.30 cm Caron Poser Electronically signed by Caron Poser Signature Date/Time: 12/02/2024/11:38:17 AM    Final    US  Venous Img Upper Bilat (DVT) Result Date: 12/01/2024 CLINICAL DATA:  Shortness of breath.  Metastatic breast cancer. EXAM: BILATERAL UPPER EXTREMITY VENOUS DOPPLER ULTRASOUND TECHNIQUE: Gray-scale sonography with graded compression, as well as color Doppler and duplex ultrasound were performed to evaluate the bilateral upper extremity deep venous systems from the level of the subclavian vein and including the jugular, axillary, basilic, radial, ulnar and upper cephalic vein. Spectral Doppler was utilized to evaluate flow at rest and with distal augmentation maneuvers. COMPARISON:  None Available. FINDINGS: RIGHT UPPER EXTREMITY Internal Jugular Vein: No evidence of thrombus. Normal compressibility, respiratory phasicity and color Doppler flow. Subclavian Vein: No evidence of thrombus. Normal color Doppler flow and phasicity. Axillary Vein: No evidence of thrombus. Normal compressibility, respiratory phasicity and response to augmentation. Cephalic Vein: Not  visualized. Basilic Vein: No evidence of thrombus. Normal compressibility, respiratory phasicity and response to augmentation. Brachial Veins: No evidence of thrombus. Normal compressibility, respiratory phasicity and response to augmentation. Radial Veins: No evidence of thrombus. Normal compressibility and color Doppler flow. Ulnar Veins: No evidence of thrombus. Normal compressibility and color Doppler flow. Other Findings:  None. LEFT UPPER EXTREMITY Internal Jugular Vein: No evidence of thrombus. Normal compressibility, respiratory phasicity and color Doppler flow. Subclavian Vein: No evidence of thrombus. Normal color Doppler flow and phasicity. Axillary Vein: No evidence of thrombus. Normal compressibility,  respiratory phasicity and response to augmentation. Cephalic Vein: No evidence of thrombus. Normal compressibility, respiratory phasicity and response to augmentation. Basilic Vein: No evidence of thrombus. Normal compressibility, respiratory phasicity and response to augmentation. Brachial Veins: No evidence of thrombus. Normal compressibility, respiratory phasicity and response to augmentation. Radial Veins: No evidence of thrombus. Normal compressibility and color Doppler flow. Ulnar Veins: No evidence of thrombus. Normal compressibility and color Doppler flow. Other Findings:  None. IMPRESSION: No evidence of DVT within either upper extremity. Electronically Signed   By: Juliene Balder M.D.   On: 12/01/2024 09:52   US  Venous Img Lower Bilateral (DVT) Result Date: 12/01/2024 CLINICAL DATA:  Evaluate for deep venous thrombosis. Metastatic breast cancer. Acute hypoxic respiratory failure. EXAM: BILATERAL LOWER EXTREMITY VENOUS DOPPLER ULTRASOUND TECHNIQUE: Gray-scale sonography with graded compression, as well as color Doppler and duplex ultrasound were performed to evaluate the lower extremity deep venous systems from the level of the common femoral vein and including the common femoral, femoral, profunda  femoral, popliteal and calf veins including the posterior tibial, peroneal and gastrocnemius veins when visible. Spectral Doppler was utilized to evaluate flow at rest and with distal augmentation maneuvers in the common femoral, femoral and popliteal veins. COMPARISON:  None Available. FINDINGS: RIGHT LOWER EXTREMITY Common Femoral Vein: No evidence of thrombus. Normal compressibility, respiratory phasicity and response to augmentation. Saphenofemoral Junction: No evidence of thrombus. Normal compressibility and flow on color Doppler imaging. Profunda Femoral Vein: No evidence of thrombus. Normal compressibility and flow on color Doppler imaging. Femoral Vein: No evidence of thrombus. Normal compressibility, respiratory phasicity and response to augmentation. Popliteal Vein: No evidence of thrombus. Normal compressibility, respiratory phasicity and response to augmentation. Calf Veins: No evidence of thrombus. Normal compressibility and flow on color Doppler imaging. Other Findings:  None. LEFT LOWER EXTREMITY Common Femoral Vein: No evidence of thrombus. Normal compressibility, respiratory phasicity and response to augmentation. Saphenofemoral Junction: No evidence of thrombus. Normal compressibility and flow on color Doppler imaging. Profunda Femoral Vein: No evidence of thrombus. Normal compressibility and flow on color Doppler imaging. Femoral Vein: No evidence of thrombus. Normal compressibility, respiratory phasicity and response to augmentation. Popliteal Vein: No evidence of thrombus. Normal compressibility, respiratory phasicity and response to augmentation. Calf Veins: No evidence of thrombus. Normal compressibility and flow on color Doppler imaging. Other Findings:  None. IMPRESSION: No evidence of deep venous thrombosis in either lower extremity. Electronically Signed   By: Juliene Balder M.D.   On: 12/01/2024 09:47   CT ANGIO HEAD NECK W WO CM Result Date: 11/30/2024 EXAM: CT HEAD WITHOUT CTA HEAD AND  NECK WITH 11/30/2024 10:04:02 AM TECHNIQUE: CTA of the head and neck was performed with the administration of 75 mL of iohexol  (OMNIPAQUE ) 350 MG/ML injection. Noncontrast CT of the head with reconstructed 2-D images are also provided for review. Multiplanar 2D and/or 3D reformatted images are provided for review. Automated exposure control, iterative reconstruction, and/or weight based adjustment of the mA/kV was utilized to reduce the radiation dose to as low as reasonably achievable. COMPARISON: MRI head 11/28/2024. CLINICAL HISTORY: Stroke/TIA, determine embolic source. Metastatic breast cancer. FINDINGS: CT HEAD: BRAIN AND VENTRICLES: The small acute right cerebellar and left cerebral infarcts on MRI are largely occult by CT. There is a 3 mm hypodensity in the left thalamus suggestive of a lacunar infarct without a corresponding abnormality on the recent MRI. Calcification and mild surrounding edema or gliosis are noted in the right frontal and right parietal lobes at the sites of known  metastases. No acute intracranial hemorrhage, midline shift, hydrocephalus, or extra-axial fluid collection is identified. Cerebral volume is normal. ORBITS: Abnormal extraconal soft tissue in the superolateral aspect of the left orbit anteriorly with extension into the preseptal soft tissues and with this abnormal soft tissue spanning approximately 3 cm in AP dimension. SINUSES AND MASTOIDS: No acute abnormality. CTA NECK: AORTIC ARCH AND ARCH VESSELS: Normal variant aortic arch branching pattern with common origin of the brachiocephalic and left common carotid arteries. No dissection or arterial injury. No significant stenosis of the brachiocephalic or subclavian arteries. CERVICAL CAROTID ARTERIES: Partially retropharyngeal course of both proximal internal carotid arteries. No dissection, arterial injury, or hemodynamically significant stenosis by NASCET criteria. CERVICAL VERTEBRAL ARTERIES: Codominant vertebral arteries. No  dissection, arterial injury, or significant stenosis. LUNGS AND MEDIASTINUM: Partially visualized mediastinal lymphadenopathy and left upper lobe consolidation, more fully evaluated on a recent CT chest from 11/26/2024. SOFT TISSUES: Partially visualized right jugular port a cath. BONES: Dental caries. Scattered small lucent spine lesions, for example involving the C4, C5, and C7 vertebral bodies potentially reflecting metastases. Skull lesions on MRI are not clearly visible by CT. CTA HEAD: ANTERIOR CIRCULATION: The intracranial internal carotid arteries are widely patent. ACAs and MCAs are patent without evidence of a proximal branch occlusion or significant proximal stenosis. No aneurysm. POSTERIOR CIRCULATION: The intracranial vertebral arteries are widely patent to the basilar. Patent PICA and SCA origins are visualized bilaterally. The basilar artery is widely patent. There are large posterior communicating arteries and hypoplastic P1 segments bilaterally. Both PCAs are patent without evidence of a significant proximal stenosis. No aneurysm. OTHER: No dural venous sinus thrombosis on this non-dedicated study. IMPRESSION: 1. Small acute right cerebellar and left cerebral infarcts on MRI are largely occult by CT, however, there may be an interval acute lacunar infarct in the left thalamus. 2. No large vessel occlusion no significant stenosis in the head or neck. 3. Known metastatic breast cancer including brain and osseous metastases. Abnormal left orbital soft tissue is also suspicious for metastasis. Electronically signed by: Dasie Hamburg MD 11/30/2024 10:37 AM EST RP Workstation: [id number]   MR BRAIN W WO CONTRAST Result Date: 11/29/2024 EXAM: MRI BRAIN WITH AND WITHOUT CONTRAST 11/28/2024 02:20:35 PM TECHNIQUE: Multiplanar multisequence MRI of the head/brain was performed with and without the administration of intravenous contrast. CONTRAST: 10 mL gadobutrol  (GADAVIST ) 1 MMOL/ML injection 10 mL GADOBUTROL   1 MMOL/ML IV SOLN. COMPARISON: MR Head 11/02/2023, outside brain MRI 11/08/2024. CLINICAL HISTORY: 49 year old female with breast cancer, metastatic disease evaluation. Metastatic disease on CT chest, abdomen, and pelvis recently. Known brain metastases on MRI last year. FINDINGS: BRAIN AND VENTRICLES: No acute intracranial hemorrhage. No mass effect or midline shift. No hydrocephalus. The sella is unremarkable. Normal flow voids. The dural venous sinuses are enhancing and appear patent. nonenhancing scattered foci of restricted diffusion which are generally subcentimeter in both the right cerebellar hemisphere (series 10 images 13 through 16), in the medial left thalamus on series 10 image 31, and scattered in the posterior superior left frontal lobe on series 10 image 45 minimal associated T2 and FLAIR hyperintensity. On SWI mild chronic hemosiderin associated with the metastatic lesions is stable. No acute intracranial hemorrhage or mass effect. Multiple Brain Lesions: 1. Small nodular and solid enhancing 6 mm lesion right inferior cerebellum on series 20 image 37 appears slightly larger on both axial and coronal postcontrast images, up to 6 mm now versus 4 to 5 mm last month. No regional edema or mass effect. 2.  Nearby 2 additional punctate enhancing bilateral inferior cerebellar metastases on all 3 postcontrast imaging sequences (series 20 images 35 and 39), and the lesion on image 35 in the right hemisphere appears increased from last month. 3. Posterior right hemisphere partially solid and nodular enhancing metastases on series 20 image 103 measuring 18 mm long axis appears stable. Regional T2 and FLAIR hyperintensity also stable and no significant regional mass effect. 4. Posterior right frontal operculum 13 mm enhancing nodular, partially solid metastasis appears stable on series 20 image 116. Mild regional T2 and FLAIR hyperintensity appears stable without mass effect. No convincing leptomeningeal  enhancement. Small developmental venous anomaly (normal variant) suspected in the left occipital lobe on series 20 image 87 and stable from 2024. Borderline to mild asymmetric right hemisphere dural pachymeningeal thickening also noted (series 20 image 102), and appears new since 2024 but not significantly changed from last month. No areas of pachymeningeal nodularity. No convincing leptomeningeal enhancement. ORBITS: No acute abnormality. SINUSES: No acute abnormality. BONES AND SOFT TISSUES: Progressive bone marrow signal heterogeneity since 2024 compatible with widespread osseous metastatic disease. No destructive osseous lesion identified. Numerous small nonspecific FLAIR hyperintense scalp nodules which are mostly new since 2024, not significantly changed from last month and nonspecific. Grossly negative visible spinal cord. IMPRESSION: 1. 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 No associated acute hemorrhage or mass effect. 2. Superimposed Five enhancing intracranial metastases, with slight interval progression in the right cerebellum since outside MRI last month. Subtle right hemisphere pachymeningeal thickening and enhancement also appears increased since 2024, with underlying diffuse osseous metastatic disease Electronically signed by: Helayne Hurst MD 11/29/2024 04:22 AM EST RP Workstation: [id number]   CT Angio Chest Pulmonary Embolism (PE) W or WO Contrast Result Date: 11/26/2024 EXAM: CTA CHEST 11/26/2024 11:37:29 PM TECHNIQUE: CTA of the chest was performed without and with the administration of 75 mL of iohexol  (OMNIPAQUE ) 350 MG/ML injection. Multiplanar reformatted images are provided for review. MIP images are provided for review. Automated exposure control, iterative reconstruction, and/or weight based adjustment of the mA/kV was utilized to reduce the radiation dose to as low as reasonably achievable. COMPARISON:  11/26/2024 CLINICAL HISTORY: Pulmonary embolism (PE) suspected, high prob. FINDINGS: PULMONARY ARTERIES: Pulmonary arteries are adequately opacified for evaluation. No acute pulmonary embolus. Main pulmonary artery is normal in caliber. MEDIASTINUM: The heart and pericardium demonstrate no acute abnormality. There is no acute abnormality of the thoracic aorta. LYMPH NODES: Bilateral hilar and mediastinal adenopathy again noted, unchanged. LUNGS AND PLEURA: Left upper lobe nodular airspace disease as well as several small pulmonary nodules again noted, unchanged. Bibasilar linear scarring or atelectasis. No evidence of pleural effusion or pneumothorax. UPPER ABDOMEN: Numerous hepatic metastases, better seen on earlier CT. 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 No acute soft tissue abnormality. IMPRESSION: 1. No evidence of pulmonary embolism. 2. Stable left upper lobe nodular airspace disease and several small pulmonary nodules. 3. Stable bilateral hilar and mediastinal adenopathy. 4. 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 5. Numerous hepatic metastases, better seen on earlier CT. Electronically signed by: Franky Crease MD 11/26/2024 11:45 PM EST RP Workstation: [id number]   CT CHEST ABDOMEN PELVIS W CONTRAST Result Date: 11/26/2024 CLINICAL DATA:  Sepsis. Breast cancer  with history of liver and spinal metastasis. EXAM: CT CHEST, ABDOMEN, AND PELVIS WITH CONTRAST TECHNIQUE: Multidetector CT imaging of the chest, abdomen and pelvis was performed following the standard protocol during bolus administration of intravenous contrast. RADIATION DOSE REDUCTION: This exam was performed according to the departmental dose-optimization program which includes automated exposure  control, adjustment of the mA and/or kV according to patient size and/or use of iterative reconstruction technique. CONTRAST:  OMNIPAQUE  IOHEXOL  300 MG/ML  SOLN COMPARISON:  10/18/2024, 04/30/2008. FINDINGS: CT CHEST FINDINGS Cardiovascular: The heart is normal in size and there is no pericardial effusion. The distal tip of a right chest port terminates at the cavoatrial junction. The aorta and pulmonary trunk are normal in caliber. Mediastinum/Nodes: Enlarged lymph nodes are present in the mediastinum measuring up to 1.9 cm in the precarinal space. There are prominent lymph nodes in the hilar regions bilaterally. Lungs/Pleura: Paraseptal and centrilobular emphysematous changes are noted in the lungs. There are scattered pulmonary nodules bilaterally, increased in size and number from the prior exam. For example there is an 8 mm nodule in the posterior segment of the left upper lobe, axial image 37, new from the previous exam. Subpleural consolidation is noted in the anterior aspect of the left upper lobe with communication of soft tissue fullness at the left hilum and, increased from the prior exam. Strandy opacities are noted at the lung bases, possible atelectasis, scarring, or infiltrate. No effusion or pneumothorax is seen. Musculoskeletal: Enlarged lymph nodes are noted in the left chest wall. Surgical clips in a small fluid collection is present in the left axilla measuring 2.6 cm, possible seroma and unchanged from the previous exam. Sclerotic lesions are noted in the thoracic spine, compatible with known metastatic disease. There are mild compression deformities in the superior endplates at T9 and T11, likely present on the prior exam. CT ABDOMEN PELVIS FINDINGS Hepatobiliary: Innumerable hypodense masses are present in the liver, the largest in the left lobe measuring 6.3 cm and increased from the prior exam. No biliary ductal dilatation. The gallbladder is without stones. There suggestion of  gallbladder wall thickening, which may be related to local inflammatory changes in the liver. Pancreas: Unremarkable. No pancreatic ductal dilatation or surrounding inflammatory changes. Spleen: Normal in size without focal abnormality. Adrenals/Urinary Tract: The adrenal glands are stable. The kidneys enhance symmetrically. No renal calculus or hydronephrosis. The bladder is unremarkable. Stomach/Bowel: There is a small hiatal hernia. The stomach is otherwise within normal limits. No bowel obstruction, free air, or pneumatosis is seen. Appendix appears normal. Vascular/Lymphatic: No significant vascular findings are present. Enlarged lymph nodes are seen in the gastrohepatic ligament and porta hepatis. Reproductive: Status post hysterectomy. No adnexal masses. Other: No abdominopelvic ascites. A fat containing periumbilical hernia is present. Musculoskeletal: Degenerative changes are present in the lumbar spine. A sclerotic region is present in the proximal sacrum, possible metastatic disease. No acute fracture is seen. IMPRESSION: 1. Left upper lobe consolidation extending to the left hilum with strandy opacities in the lungs bilaterally, possible infiltrate, scarring, or worsening neoplastic process. 2. Interval increase in number and size of hepatic metastasis and pulmonary nodules. 3. Enlargement of mediastinal, hilar, and upper abdominal lymph adenopathy, likely representing metastatic disease. 4. Osteoblastic metastatic disease. Electronically Signed   By: Leita Birmingham M.D.   On: 11/26/2024 15:44   DG Chest Port 1 View if patient is in a treatment room. Result Date: 11/26/2024 EXAM: 1 VIEW(S) XRAY OF THE CHEST 11/26/2024 01:02:00 PM COMPARISON: None available. CLINICAL  HISTORY: Suspected Sepsis FINDINGS: LINES, TUBES AND DEVICES: Right chest Port-A-Cath in place with tip projecting over the right atrium approximately 2 cm distal to the superior cavoatrial junction. LUNGS AND PLEURA: Spontaneous densities  overlying the bilateral lower lung zones are likely external to the patient. Band-like opacity at right lung base. No pleural effusion. No pneumothorax. HEART AND MEDIASTINUM: No acute abnormality of the cardiac and mediastinal silhouettes. BONES AND SOFT TISSUES: Left axillary surgical clips noted. No acute osseous abnormality. IMPRESSION: 1. Band-like opacity at the right lung base, favor subsegmental atelectasis. 2. Right chest Port-A-Cath tip projects in the right atrium, approximately 2 cm distal to the superior cavoatrial junction. Electronically signed by: Morgane Naveau MD 11/26/2024 01:10 PM EST RP Workstation: [id number]   ECHOCARDIOGRAM COMPLETE Result Date: 11/16/2024    ECHOCARDIOGRAM REPORT   Patient Name:   Tara Hanna Date of Exam: 11/16/2024 Medical Rec #:  [id number]       Height:       64.0 in Accession #:    [id number]      Weight:       216.5 lb Date of Birth:  1976/09/22      BSA:          2.023 m Patient Age:    48 years        BP:           159/114 mmHg Patient Gender: F               HR:           112 bpm. Exam Location:  [facility] Procedure: 2D Echo, Cardiac Doppler and Color Doppler (Both Spectral and Color            Flow Doppler were utilized during procedure). Indications:     Chemo Z09  History:         Patient has no prior history of Echocardiogram examinations.  Sonographer:     Rosina Dunk Referring Phys:  [id number] CINDY SAUNDERS [facility] Diagnosing Phys: Caron Poser  Sonographer Comments: Image acquisition challenging due to respiratory motion. IMPRESSIONS  1. Very technically difficult study.  2. Left ventricular ejection fraction, by estimation, is 50 to 55%. Left ventricular ejection fraction by 2D MOD biplane is 51.9 %. The left ventricle has low normal function. Left ventricular endocardial border not optimally defined to evaluate regional wall motion. Indeterminate diastolic filling due to E-A fusion.  3. Right ventricular systolic function is normal. The right  ventricular size is normal.  4. The mitral valve is normal in structure. No evidence of mitral valve regurgitation. No evidence of mitral stenosis.  5. The aortic valve has an indeterminant number of cusps. Aortic valve regurgitation is not visualized. No aortic stenosis is present. Comparison(s): No prior Echocardiogram. FINDINGS  Left Ventricle: Left ventricular ejection fraction, by estimation, is 50 to 55%. Left ventricular ejection fraction by 2D MOD biplane is 51.9 %. The left ventricle has low normal function. Left ventricular endocardial border not optimally defined to evaluate regional wall motion. The left ventricular internal cavity size was normal in size. There is no left ventricular hypertrophy. Indeterminate diastolic filling due to E-A fusion. Right Ventricle: The right ventricular size is normal. Right vetricular wall thickness was not well visualized. Right ventricular systolic function is normal. Left Atrium: Left atrial size was not well visualized. Right Atrium: Right atrial size was not well visualized. Pericardium: There is no evidence of pericardial effusion. Presence of epicardial fat layer. Mitral Valve: The mitral valve  is normal in structure. No evidence of mitral valve regurgitation. No evidence of mitral valve stenosis. MV peak gradient, 3.4 mmHg. The mean mitral valve gradient is 1.0 mmHg. Tricuspid Valve: The tricuspid valve is not well visualized. Tricuspid valve regurgitation is not demonstrated. No evidence of tricuspid stenosis. Aortic Valve: The aortic valve has an indeterminant number of cusps. Aortic valve regurgitation is not visualized. No aortic stenosis is present. Aortic valve mean gradient measures 1.0 mmHg. Aortic valve peak gradient measures 1.9 mmHg. Aortic valve area, by VTI measures 5.25 cm. Pulmonic Valve: The pulmonic valve was not well visualized. Pulmonic valve regurgitation is trivial. No evidence of pulmonic stenosis. Aorta: The aortic root and ascending aorta  are structurally normal, with no evidence of dilitation. Venous: The inferior vena cava was not well visualized. IAS/Shunts: The interatrial septum was not well visualized.  LEFT VENTRICLE PLAX 2D                        Biplane EF (MOD) LVIDd:         3.60 cm         LV Biplane EF:   Left LVIDs:         2.50 cm                          ventricular LV PW:         1.00 cm                          ejection LV IVS:        0.90 cm                          fraction by LVOT diam:     2.20 cm                          2D MOD LV SV:         38                               biplane is LV SV Index:   19                               51.9 %. LVOT Area:     3.80 cm                                Diastology                                LV e' medial:    10.10 cm/s LV Volumes (MOD)               LV E/e' medial:  4.2 LV vol d, MOD    44.9 ml       LV e' lateral:   7.62 cm/s A2C:                           LV E/e' lateral: 5.6 LV vol d, MOD    41.1 ml A4C: LV vol s, MOD  25.7 ml A2C: LV vol s, MOD    17.9 ml A4C: LV SV MOD A2C:   19.2 ml LV SV MOD A4C:   41.1 ml LV SV MOD BP:    24.2 ml RIGHT VENTRICLE RV Basal diam:  3.30 cm     PULMONARY VEINS RV Mid diam:    2.10 cm     A Reversal Duration: 102.00 msec RV S prime:     11.70 cm/s  A Reversal Velocity: 56.60 cm/s TAPSE (M-mode): 1.6 cm      Diastolic Velocity:  47.80 cm/s                             S/D Velocity:        1.50                             Systolic Velocity:   73.90 cm/s LEFT ATRIUM             Index       RIGHT ATRIUM           Index LA diam:        2.60 cm 1.28 cm/m  RA Area:     11.60 cm LA Vol (A2C):   17.5 ml 8.65 ml/m  RA Volume:   26.80 ml  13.24 ml/m LA Vol (A4C):   15.8 ml 7.81 ml/m LA Biplane Vol: 18.5 ml 9.14 ml/m  AORTIC VALVE                    PULMONIC VALVE AV Area (Vmax):    4.04 cm     PV Vmax:          0.76 m/s AV Area (Vmean):   3.75 cm     PV Vmean:         51.300 cm/s AV Area (VTI):     5.25 cm     PV VTI:           0.114 m AV Vmax:            69.00 cm/s   PV Peak grad:     2.3 mmHg AV Vmean:          45.700 cm/s  PV Mean grad:     1.0 mmHg AV VTI:            0.072 m      PR End Diast Vel: 6.25 msec AV Peak Grad:      1.9 mmHg     RVOT Peak grad:   2 mmHg AV Mean Grad:      1.0 mmHg LVOT Vmax:         73.40 cm/s LVOT Vmean:        45.100 cm/s LVOT VTI:          0.099 m LVOT/AV VTI ratio: 1.38  AORTA Ao Root diam: 3.00 cm Ao Asc diam:  3.20 cm MITRAL VALVE MV Area (PHT): 6.80 cm    SHUNTS MV Area VTI:   2.77 cm    Systemic VTI:  0.10 m MV Peak grad:  3.4 mmHg    Systemic Diam: 2.20 cm MV Mean grad:  1.0 mmHg    Pulmonic VTI:  0.075 m MV Vmax:       0.92 m/s MV Vmean:      50.0 cm/s MV Decel Time: 112 msec MV E velocity: 42.70  cm/s MV A velocity: 91.70 cm/s MV E/A ratio:  0.47 Caron Poser Electronically signed by Caron Poser Signature Date/Time: 11/16/2024/3:20:31 PM    Final     Time coordinating discharge: 35 mins  SIGNED:  Norval Bar, MD [facility] Dec 18, 2024, 3:42 PM     [1] No Known Allergies  "

## 2024-12-25 NOTE — Progress Notes (Signed)
 Husband at bedside. Husband requested that her jewelry be removed. 3 yellow bracelets, 2 yellow rings with clear stones removed from patient and placed in a clear ziplock bag and then given to husband, [employer].Chaplin was offered but husband declined.

## 2024-12-25 DEATH — deceased

## 2024-12-28 DIAGNOSIS — C7931 Secondary malignant neoplasm of brain: Principal | ICD-10-CM

## 2024-12-28 DIAGNOSIS — Z170 Estrogen receptor positive status [ER+]: Secondary | ICD-10-CM

## 2024-12-28 DIAGNOSIS — C50912 Malignant neoplasm of unspecified site of left female breast: Secondary | ICD-10-CM

## 2024-12-29 DIAGNOSIS — C7931 Secondary malignant neoplasm of brain: Principal | ICD-10-CM

## 2024-12-29 DIAGNOSIS — Z170 Estrogen receptor positive status [ER+]: Secondary | ICD-10-CM

## 2024-12-29 DIAGNOSIS — C50912 Malignant neoplasm of unspecified site of left female breast: Secondary | ICD-10-CM
